# Patient Record
Sex: Female | Born: 1970
Health system: Southern US, Community
[De-identification: ages and names within clinical notes are randomized; demographics above are authoritative.]

## PROBLEM LIST (undated history)

## (undated) VITALS — BP 103/68 | HR 72 | Temp 97.7°F | Resp 16 | Ht 61.0 in | Wt 141.0 lb

## (undated) DIAGNOSIS — I10 Essential (primary) hypertension: Secondary | ICD-10-CM

## (undated) DIAGNOSIS — Z789 Other specified health status: Secondary | ICD-10-CM

## (undated) DIAGNOSIS — F319 Bipolar disorder, unspecified: Secondary | ICD-10-CM

## (undated) DIAGNOSIS — D219 Benign neoplasm of connective and other soft tissue, unspecified: Secondary | ICD-10-CM

## (undated) DIAGNOSIS — D571 Sickle-cell disease without crisis: Secondary | ICD-10-CM

## (undated) DIAGNOSIS — F32A Depression, unspecified: Secondary | ICD-10-CM

## (undated) DIAGNOSIS — Z8742 Personal history of other diseases of the female genital tract: Secondary | ICD-10-CM

## (undated) DIAGNOSIS — C50919 Malignant neoplasm of unspecified site of unspecified female breast: Secondary | ICD-10-CM

## (undated) DIAGNOSIS — D573 Sickle-cell trait: Secondary | ICD-10-CM

## (undated) DIAGNOSIS — H269 Unspecified cataract: Secondary | ICD-10-CM

## (undated) DIAGNOSIS — T7840XA Allergy, unspecified, initial encounter: Secondary | ICD-10-CM

## (undated) DIAGNOSIS — F419 Anxiety disorder, unspecified: Secondary | ICD-10-CM

## (undated) DIAGNOSIS — N921 Excessive and frequent menstruation with irregular cycle: Secondary | ICD-10-CM

## (undated) DIAGNOSIS — J449 Chronic obstructive pulmonary disease, unspecified: Secondary | ICD-10-CM

## (undated) DIAGNOSIS — F329 Major depressive disorder, single episode, unspecified: Secondary | ICD-10-CM

## (undated) DIAGNOSIS — M1990 Unspecified osteoarthritis, unspecified site: Secondary | ICD-10-CM

## (undated) DIAGNOSIS — Z923 Personal history of irradiation: Secondary | ICD-10-CM

## (undated) DIAGNOSIS — C569 Malignant neoplasm of unspecified ovary: Secondary | ICD-10-CM

## (undated) DIAGNOSIS — H409 Unspecified glaucoma: Secondary | ICD-10-CM

## (undated) HISTORY — DX: Sickle-cell disease without crisis: D57.1

## (undated) HISTORY — DX: Personal history of other diseases of the female genital tract: Z87.42

## (undated) HISTORY — DX: Unspecified glaucoma: H40.9

## (undated) HISTORY — DX: Chronic obstructive pulmonary disease, unspecified: J44.9

## (undated) HISTORY — DX: Allergy, unspecified, initial encounter: T78.40XA

## (undated) HISTORY — DX: Benign neoplasm of connective and other soft tissue, unspecified: D21.9

## (undated) HISTORY — DX: Anxiety disorder, unspecified: F41.9

## (undated) HISTORY — PX: CATARACT EXTRACTION, BILATERAL: SHX1313

## (undated) HISTORY — DX: Malignant neoplasm of unspecified site of unspecified female breast: C50.919

## (undated) HISTORY — DX: Unspecified cataract: H26.9

## (undated) HISTORY — DX: Sickle-cell trait: D57.3

## (undated) HISTORY — DX: Excessive and frequent menstruation with irregular cycle: N92.1

---

## 1997-11-02 ENCOUNTER — Inpatient Hospital Stay (HOSPITAL_COMMUNITY): Admission: AD | Admit: 1997-11-02 | Discharge: 1997-11-05 | Payer: Self-pay | Admitting: Obstetrics

## 1998-05-30 ENCOUNTER — Emergency Department (HOSPITAL_COMMUNITY): Admission: EM | Admit: 1998-05-30 | Discharge: 1998-05-30 | Payer: Self-pay

## 1998-07-18 ENCOUNTER — Other Ambulatory Visit: Admission: RE | Admit: 1998-07-18 | Discharge: 1998-07-18 | Payer: Self-pay | Admitting: Obstetrics

## 1999-05-06 ENCOUNTER — Emergency Department (HOSPITAL_COMMUNITY): Admission: EM | Admit: 1999-05-06 | Discharge: 1999-05-07 | Payer: Self-pay | Admitting: Internal Medicine

## 2000-09-16 ENCOUNTER — Emergency Department (HOSPITAL_COMMUNITY): Admission: EM | Admit: 2000-09-16 | Discharge: 2000-09-16 | Payer: Self-pay | Admitting: Emergency Medicine

## 2000-09-16 ENCOUNTER — Encounter: Payer: Self-pay | Admitting: Emergency Medicine

## 2001-04-26 ENCOUNTER — Emergency Department (HOSPITAL_COMMUNITY): Admission: EM | Admit: 2001-04-26 | Discharge: 2001-04-26 | Payer: Self-pay | Admitting: Emergency Medicine

## 2001-07-24 ENCOUNTER — Encounter: Payer: Self-pay | Admitting: Emergency Medicine

## 2001-07-24 ENCOUNTER — Emergency Department (HOSPITAL_COMMUNITY): Admission: EM | Admit: 2001-07-24 | Discharge: 2001-07-24 | Payer: Self-pay | Admitting: Emergency Medicine

## 2002-01-23 ENCOUNTER — Emergency Department (HOSPITAL_COMMUNITY): Admission: EM | Admit: 2002-01-23 | Discharge: 2002-01-23 | Payer: Self-pay | Admitting: Emergency Medicine

## 2002-02-27 ENCOUNTER — Encounter: Payer: Self-pay | Admitting: Emergency Medicine

## 2002-02-27 ENCOUNTER — Emergency Department (HOSPITAL_COMMUNITY): Admission: EM | Admit: 2002-02-27 | Discharge: 2002-02-27 | Payer: Self-pay | Admitting: Emergency Medicine

## 2002-07-17 ENCOUNTER — Emergency Department (HOSPITAL_COMMUNITY): Admission: EM | Admit: 2002-07-17 | Discharge: 2002-07-17 | Payer: Self-pay | Admitting: Emergency Medicine

## 2003-12-02 ENCOUNTER — Emergency Department (HOSPITAL_COMMUNITY): Admission: EM | Admit: 2003-12-02 | Discharge: 2003-12-03 | Payer: Self-pay | Admitting: Emergency Medicine

## 2004-05-28 ENCOUNTER — Emergency Department (HOSPITAL_COMMUNITY): Admission: EM | Admit: 2004-05-28 | Discharge: 2004-05-29 | Payer: Self-pay | Admitting: Emergency Medicine

## 2005-03-18 ENCOUNTER — Emergency Department (HOSPITAL_COMMUNITY): Admission: EM | Admit: 2005-03-18 | Discharge: 2005-03-19 | Payer: Self-pay | Admitting: Emergency Medicine

## 2005-05-26 ENCOUNTER — Emergency Department (HOSPITAL_COMMUNITY): Admission: EM | Admit: 2005-05-26 | Discharge: 2005-05-26 | Payer: Self-pay | Admitting: Emergency Medicine

## 2006-02-20 ENCOUNTER — Emergency Department (HOSPITAL_COMMUNITY): Admission: EM | Admit: 2006-02-20 | Discharge: 2006-02-20 | Payer: Self-pay | Admitting: Emergency Medicine

## 2006-03-12 ENCOUNTER — Emergency Department (HOSPITAL_COMMUNITY): Admission: EM | Admit: 2006-03-12 | Discharge: 2006-03-12 | Payer: Self-pay | Admitting: Emergency Medicine

## 2006-04-12 ENCOUNTER — Emergency Department (HOSPITAL_COMMUNITY): Admission: EM | Admit: 2006-04-12 | Discharge: 2006-04-12 | Payer: Self-pay | Admitting: Emergency Medicine

## 2006-05-15 ENCOUNTER — Emergency Department (HOSPITAL_COMMUNITY): Admission: EM | Admit: 2006-05-15 | Discharge: 2006-05-15 | Payer: Self-pay | Admitting: Emergency Medicine

## 2006-06-16 ENCOUNTER — Emergency Department (HOSPITAL_COMMUNITY): Admission: EM | Admit: 2006-06-16 | Discharge: 2006-06-16 | Payer: Self-pay | Admitting: Emergency Medicine

## 2006-08-09 ENCOUNTER — Emergency Department (HOSPITAL_COMMUNITY): Admission: EM | Admit: 2006-08-09 | Discharge: 2006-08-09 | Payer: Self-pay | Admitting: Emergency Medicine

## 2006-08-12 ENCOUNTER — Emergency Department (HOSPITAL_COMMUNITY): Admission: EM | Admit: 2006-08-12 | Discharge: 2006-08-12 | Payer: Self-pay | Admitting: Emergency Medicine

## 2006-08-26 ENCOUNTER — Emergency Department (HOSPITAL_COMMUNITY): Admission: EM | Admit: 2006-08-26 | Discharge: 2006-08-26 | Payer: Self-pay | Admitting: Emergency Medicine

## 2006-09-13 ENCOUNTER — Ambulatory Visit: Payer: Self-pay | Admitting: Internal Medicine

## 2006-09-16 ENCOUNTER — Ambulatory Visit (HOSPITAL_COMMUNITY): Admission: RE | Admit: 2006-09-16 | Discharge: 2006-09-16 | Payer: Self-pay | Admitting: Internal Medicine

## 2006-09-18 ENCOUNTER — Ambulatory Visit: Payer: Self-pay | Admitting: *Deleted

## 2006-10-29 ENCOUNTER — Ambulatory Visit: Payer: Self-pay | Admitting: Internal Medicine

## 2006-12-28 ENCOUNTER — Emergency Department (HOSPITAL_COMMUNITY): Admission: EM | Admit: 2006-12-28 | Discharge: 2006-12-28 | Payer: Self-pay | Admitting: Emergency Medicine

## 2007-04-29 ENCOUNTER — Emergency Department (HOSPITAL_COMMUNITY): Admission: EM | Admit: 2007-04-29 | Discharge: 2007-04-29 | Payer: Self-pay | Admitting: Emergency Medicine

## 2007-06-04 ENCOUNTER — Encounter (INDEPENDENT_AMBULATORY_CARE_PROVIDER_SITE_OTHER): Payer: Self-pay | Admitting: *Deleted

## 2007-06-27 ENCOUNTER — Emergency Department (HOSPITAL_COMMUNITY): Admission: EM | Admit: 2007-06-27 | Discharge: 2007-06-28 | Payer: Self-pay | Admitting: Emergency Medicine

## 2007-09-27 ENCOUNTER — Emergency Department (HOSPITAL_COMMUNITY): Admission: EM | Admit: 2007-09-27 | Discharge: 2007-09-27 | Payer: Self-pay | Admitting: Emergency Medicine

## 2007-10-22 ENCOUNTER — Ambulatory Visit: Payer: Self-pay | Admitting: Family Medicine

## 2008-06-28 ENCOUNTER — Emergency Department (HOSPITAL_COMMUNITY): Admission: EM | Admit: 2008-06-28 | Discharge: 2008-06-28 | Payer: Self-pay | Admitting: Emergency Medicine

## 2008-07-04 ENCOUNTER — Emergency Department (HOSPITAL_COMMUNITY): Admission: EM | Admit: 2008-07-04 | Discharge: 2008-07-04 | Payer: Self-pay | Admitting: Emergency Medicine

## 2008-08-31 ENCOUNTER — Ambulatory Visit: Payer: Self-pay | Admitting: Family Medicine

## 2008-10-12 ENCOUNTER — Ambulatory Visit: Payer: Self-pay | Admitting: Internal Medicine

## 2008-10-12 LAB — CONVERTED CEMR LAB
ALT: 8 units/L (ref 0–35)
AST: 10 units/L (ref 0–37)
Albumin: 4 g/dL (ref 3.5–5.2)
Alkaline Phosphatase: 65 units/L (ref 39–117)
BUN: 7 mg/dL (ref 6–23)
CO2: 23 meq/L (ref 19–32)
Calcium: 8.7 mg/dL (ref 8.4–10.5)
Chloride: 109 meq/L (ref 96–112)
Cholesterol: 153 mg/dL (ref 0–200)
Creatinine, Ser: 0.73 mg/dL (ref 0.40–1.20)
Glucose, Bld: 76 mg/dL (ref 70–99)
HDL: 43 mg/dL (ref >39)
LDL Cholesterol: 98 mg/dL (ref 0–99)
Potassium: 4.1 meq/L (ref 3.5–5.3)
Sodium: 143 meq/L (ref 135–145)
TSH: 0.998 microintl units/mL (ref 0.350–4.50)
Total Bilirubin: 0.4 mg/dL (ref 0.3–1.2)
Total CHOL/HDL Ratio: 3.6
Total Protein: 6.8 g/dL (ref 6.0–8.3)
Triglycerides: 59 mg/dL (ref <150)
VLDL: 12 mg/dL (ref 0–40)

## 2008-11-11 ENCOUNTER — Emergency Department (HOSPITAL_COMMUNITY): Admission: EM | Admit: 2008-11-11 | Discharge: 2008-11-11 | Payer: Self-pay | Admitting: Emergency Medicine

## 2009-09-26 ENCOUNTER — Ambulatory Visit: Payer: Self-pay | Admitting: Internal Medicine

## 2009-09-30 ENCOUNTER — Inpatient Hospital Stay (HOSPITAL_COMMUNITY): Admission: AD | Admit: 2009-09-30 | Discharge: 2009-09-30 | Payer: Self-pay | Admitting: Obstetrics & Gynecology

## 2009-11-17 ENCOUNTER — Other Ambulatory Visit: Payer: Self-pay | Admitting: Obstetrics & Gynecology

## 2009-11-17 ENCOUNTER — Ambulatory Visit: Payer: Self-pay | Admitting: Obstetrics & Gynecology

## 2009-11-21 ENCOUNTER — Ambulatory Visit (HOSPITAL_COMMUNITY): Admission: RE | Admit: 2009-11-21 | Discharge: 2009-11-21 | Payer: Self-pay | Admitting: Obstetrics & Gynecology

## 2010-01-01 ENCOUNTER — Ambulatory Visit: Payer: Self-pay | Admitting: Obstetrics and Gynecology

## 2010-01-01 ENCOUNTER — Inpatient Hospital Stay (HOSPITAL_COMMUNITY): Admission: AD | Admit: 2010-01-01 | Discharge: 2010-01-01 | Payer: Self-pay | Admitting: Family Medicine

## 2010-03-29 ENCOUNTER — Ambulatory Visit: Payer: Self-pay | Admitting: Obstetrics and Gynecology

## 2010-03-29 ENCOUNTER — Other Ambulatory Visit: Admission: RE | Admit: 2010-03-29 | Discharge: 2010-03-29 | Payer: Self-pay | Admitting: Obstetrics & Gynecology

## 2010-03-29 LAB — CONVERTED CEMR LAB
HCT: 35.6 % — ABNORMAL LOW (ref 36.0–46.0)
Hemoglobin: 11.9 g/dL — ABNORMAL LOW (ref 12.0–15.0)
MCHC: 33.4 g/dL (ref 30.0–36.0)
MCV: 78.9 fL (ref 78.0–100.0)
Platelets: 316 10*3/uL (ref 150–400)
RBC: 4.51 M/uL (ref 3.87–5.11)
RDW: 14 % (ref 11.5–15.5)
TSH: 0.904 microintl units/mL (ref 0.350–4.500)
WBC: 7.7 10*3/uL (ref 4.0–10.5)

## 2010-03-30 ENCOUNTER — Encounter: Payer: Self-pay | Admitting: Obstetrics and Gynecology

## 2010-03-30 LAB — CONVERTED CEMR LAB
Trich, Wet Prep: NONE SEEN
Yeast Wet Prep HPF POC: NONE SEEN

## 2010-04-12 ENCOUNTER — Ambulatory Visit: Payer: Self-pay | Admitting: Obstetrics and Gynecology

## 2010-05-05 ENCOUNTER — Emergency Department (HOSPITAL_COMMUNITY): Admission: EM | Admit: 2010-05-05 | Discharge: 2010-05-05 | Payer: Self-pay | Admitting: Emergency Medicine

## 2010-05-23 ENCOUNTER — Ambulatory Visit: Payer: Self-pay | Admitting: Internal Medicine

## 2010-05-29 ENCOUNTER — Ambulatory Visit (HOSPITAL_COMMUNITY): Admission: RE | Admit: 2010-05-29 | Discharge: 2010-05-29 | Payer: Self-pay | Admitting: Internal Medicine

## 2010-06-26 ENCOUNTER — Encounter (INDEPENDENT_AMBULATORY_CARE_PROVIDER_SITE_OTHER): Payer: Self-pay | Admitting: Internal Medicine

## 2010-06-26 ENCOUNTER — Ambulatory Visit: Payer: Self-pay | Admitting: Internal Medicine

## 2010-06-26 LAB — CONVERTED CEMR LAB
Basophils Absolute: 0 10*3/uL (ref 0.0–0.1)
Basophils Relative: 0 % (ref 0–1)
Eosinophils Absolute: 0.1 10*3/uL (ref 0.0–0.7)
Eosinophils Relative: 1 % (ref 0–5)
HCT: 36.8 % (ref 36.0–46.0)
Hemoglobin: 12.1 g/dL (ref 12.0–15.0)
Lymphocytes Relative: 21 % (ref 12–46)
Lymphs Abs: 1.6 10*3/uL (ref 0.7–4.0)
MCHC: 32.9 g/dL (ref 30.0–36.0)
MCV: 81.1 fL (ref 78.0–100.0)
Monocytes Absolute: 0.4 10*3/uL (ref 0.1–1.0)
Monocytes Relative: 6 % (ref 3–12)
Neutro Abs: 5.3 10*3/uL (ref 1.7–7.7)
Neutrophils Relative %: 72 % (ref 43–77)
Platelets: 337 10*3/uL (ref 150–400)
RBC: 4.54 M/uL (ref 3.87–5.11)
RDW: 13.8 % (ref 11.5–15.5)
TSH: 0.695 microintl units/mL (ref 0.350–4.500)
WBC: 7.4 10*3/uL (ref 4.0–10.5)

## 2010-06-28 ENCOUNTER — Ambulatory Visit: Payer: Self-pay | Admitting: Advanced Practice Midwife

## 2010-07-03 ENCOUNTER — Ambulatory Visit (HOSPITAL_COMMUNITY): Admission: RE | Admit: 2010-07-03 | Discharge: 2010-07-03 | Payer: Self-pay | Admitting: Internal Medicine

## 2010-09-14 ENCOUNTER — Ambulatory Visit: Payer: Self-pay | Admitting: Obstetrics & Gynecology

## 2010-09-20 ENCOUNTER — Inpatient Hospital Stay (HOSPITAL_COMMUNITY)
Admission: AD | Admit: 2010-09-20 | Discharge: 2010-09-20 | Payer: Self-pay | Source: Home / Self Care | Attending: Obstetrics & Gynecology | Admitting: Obstetrics & Gynecology

## 2010-09-20 LAB — CBC
HCT: 33.5 % — ABNORMAL LOW (ref 36.0–46.0)
Hemoglobin: 11.4 g/dL — ABNORMAL LOW (ref 12.0–15.0)
MCH: 26.5 pg (ref 26.0–34.0)
MCHC: 34 g/dL (ref 30.0–36.0)
MCV: 77.9 fL — ABNORMAL LOW (ref 78.0–100.0)
Platelets: 319 10*3/uL (ref 150–400)
RBC: 4.3 MIL/uL (ref 3.87–5.11)
RDW: 13.3 % (ref 11.5–15.5)
WBC: 7.5 10*3/uL (ref 4.0–10.5)

## 2010-09-20 LAB — WET PREP, GENITAL
Clue Cells Wet Prep HPF POC: NONE SEEN
Trich, Wet Prep: NONE SEEN
Yeast Wet Prep HPF POC: NONE SEEN

## 2010-09-21 LAB — GC/CHLAMYDIA PROBE AMP, GENITAL
Chlamydia, DNA Probe: NEGATIVE
GC Probe Amp, Genital: NEGATIVE

## 2010-10-16 ENCOUNTER — Ambulatory Visit
Admission: RE | Admit: 2010-10-16 | Discharge: 2010-10-16 | Payer: Self-pay | Source: Home / Self Care | Attending: Obstetrics & Gynecology | Admitting: Obstetrics & Gynecology

## 2010-10-17 NOTE — Progress Notes (Signed)
[name], [name]             ACCOUNT NO.:  [id number]  MEDICAL RECORD NO.:  [id number]          PATIENT TYPE:  WOC  LOCATION:  [facility]                   FACILITY:  [facility]  PHYSICIAN:  Maryelizabeth Kaufmann, MD  DATE OF BIRTH:  May 24, 1971  DATE OF SERVICE:  10/16/2010                                 CLINIC NOTE  CHIEF COMPLAINT:  Followup from [facility] and test results.  HISTORY OF PRESENT ILLNESS:  This is a 40 year old gravida 3, para 3-0-0- 3, status post C-section x3 with bilateral tubal ligation and currently on Depo-Provera for ovarian cyst who presented to the [facility] on September 20, 2010, with persistent heavy uterine bleeding since her Depo-Provera was started on September 14, 2010.  She stated at that time she was having heavy period.  She is using about 5 pads a day.  The patient did have at that time a hemoglobin of 11.4 and a hematocrit of 33.5.  Transvaginal ultrasound showed endo-thickness of the myometrial endometrial junction possibly due to adenomyosis.  The patient at that time was started on Megace; and since that time for the past couple of weeks, she has not had any vaginal bleeding.  She states she is clinically stable and did not have any acute complaints or problems at this time.  PHYSICAL EXAMINATION:  VITAL SIGNS:  Blood pressure 116/82, pulse 97, temperature 98.7. GENERAL:  No acute distress, alert and oriented x4. CARDIOVASCULAR:  Regular rate and rhythm.  No murmurs, rubs, or gallops. CHEST:  Clear to auscultation bilaterally.  No wheezing, rales, or rhonchi. ABDOMEN:  Positive bowel sounds, soft, nontender, nondistended. GU:  Normal external genitalia.  Mild vaginal atrophy, otherwise, was normal with pink mucosa.  Cervix did have some fallopian cysts, but otherwise, was closed.  No bleeding and no lesions, otherwise were visualized.  On bimanual exam, anteverted uterus, mobile, nontender. Adnexa was soft, nontender with no masses.  ASSESSMENT AND PLAN:   This is a 40 year old gravida 3, para 3-0-0-3 who presents for followup of dysfunctional uterine bleeding secondary to progesterone breakthrough bleeding due to Depo-Provera, currently managed with Megace.  The patient has completed a course of Megace of 14 days.  The patient is otherwise clinically stable, we will manage expectantly, and the patient will otherwise return in 2 months for her next Depo-Provera shot and continue discussion for abnormal uterine bleeding.  The patient was told to return if she has any increase in vaginal bleeding.  However, she is currently clinically stable status post her Megace for the past 14 days.  She is over the age of 47 and she is a smoker; so due to those risk factors, we will not continue Megace at this time and we will manage expectantly.          ______________________________ Maryelizabeth Kaufmann, MD    LC/MEDQ  D:  10/16/2010  T:  10/17/2010  Job:  [id number]

## 2010-11-27 ENCOUNTER — Ambulatory Visit: Payer: Self-pay | Admitting: Family Medicine

## 2010-11-27 DIAGNOSIS — N949 Unspecified condition associated with female genital organs and menstrual cycle: Secondary | ICD-10-CM

## 2010-11-27 DIAGNOSIS — N925 Other specified irregular menstruation: Secondary | ICD-10-CM

## 2010-11-30 LAB — URINALYSIS, ROUTINE W REFLEX MICROSCOPIC
Nitrite: POSITIVE — AB
Specific Gravity, Urine: 1.006 (ref 1.005–1.030)
Urobilinogen, UA: 1 mg/dL (ref 0.0–1.0)

## 2010-11-30 LAB — URINE MICROSCOPIC-ADD ON

## 2010-12-03 LAB — URINE MICROSCOPIC-ADD ON

## 2010-12-03 LAB — POCT PREGNANCY, URINE: Preg Test, Ur: NEGATIVE

## 2010-12-03 LAB — URINALYSIS, ROUTINE W REFLEX MICROSCOPIC
Bilirubin Urine: NEGATIVE
Glucose, UA: NEGATIVE mg/dL
Nitrite: POSITIVE — AB
Specific Gravity, Urine: 1.015 (ref 1.005–1.030)
pH: 5 (ref 5.0–8.0)

## 2010-12-03 LAB — GC/CHLAMYDIA PROBE AMP, GENITAL: Chlamydia, DNA Probe: NEGATIVE

## 2010-12-03 LAB — CBC
Hemoglobin: 11.6 g/dL — ABNORMAL LOW (ref 12.0–15.0)
MCHC: 33.2 g/dL (ref 30.0–36.0)
RDW: 13.4 % (ref 11.5–15.5)

## 2010-12-03 LAB — WET PREP, GENITAL: Trich, Wet Prep: NONE SEEN

## 2010-12-03 LAB — SAMPLE TO BLOOD BANK

## 2010-12-05 ENCOUNTER — Ambulatory Visit: Payer: Self-pay

## 2010-12-05 LAB — URINE MICROSCOPIC-ADD ON

## 2010-12-05 LAB — URINALYSIS, ROUTINE W REFLEX MICROSCOPIC
Bilirubin Urine: NEGATIVE
Leukocytes, UA: NEGATIVE
Nitrite: POSITIVE — AB
Specific Gravity, Urine: <1.005 — ABNORMAL LOW (ref 1.005–1.030)
pH: 6 (ref 5.0–8.0)

## 2010-12-05 LAB — URINE CULTURE

## 2010-12-08 NOTE — Progress Notes (Signed)
[name], [name]             ACCOUNT NO.:  [id number]  MEDICAL RECORD NO.:  [id number]           PATIENT TYPE:  A  LOCATION[facility]  [facility]                   FACILITY:  [facility]  PHYSICIAN:  Maryelizabeth Kaufmann, MD  DATE OF BIRTH:  11-11-1970  DATE OF SERVICE:  11/27/2010                                 CLINIC NOTE  CHIEF COMPLAINT:  2 months followup, dysfunctional uterine bleeding.  HISTORY OF PRESENT ILLNESS:  This is a 40 year old gravida 3, para 3-0-0- 3, who initially presented about a month ago for progesterone breakthrough bleeding and dysfunctional uterine bleeding on Depo- Provera.  Today, she states that she still has some mild pinkish vaginal discharge and her bleeding has subsequently resolved.  She did state that she has some umbilical abdominal pain that started yesterday.  No fever or chills, but did have one episode of vomiting yesterday.  She denies any sick contacts.  She is otherwise asymptomatic and doing well with no other acute complaints.  PHYSICAL EXAMINATION:  VITAL SIGNS:  Blood pressure 113/79, pulse 95, temperature 98.2, and weight 176.8. ABDOMEN:  Positive bowel sounds.  Soft, some mild umbilical tenderness to palpation.  No rebound or guarding.  No radiation of the pain. EXTREMITIES:  No clubbing, cyanosis, or edema.  ASSESSMENT AND PLAN:  This is a 40 year old G3, P3-0-0-3 with dysfunctional uterine bleeding controlled on Depo-Provera. 1. We will have the patient get her Depo-Provera injection today. 2. [facility].  Her last Pap smear was March 2011.  We will have     the patient return in 1 month for complete well-woman exam.          ______________________________ Maryelizabeth Kaufmann, MD    LC/MEDQ  D:  11/27/2010  T:  11/28/2010  Job:  [id number]

## 2010-12-11 LAB — POCT URINALYSIS DIP (DEVICE)
Bilirubin Urine: NEGATIVE
Glucose, UA: NEGATIVE mg/dL
Ketones, ur: NEGATIVE mg/dL
Specific Gravity, Urine: 1.015 (ref 1.005–1.030)

## 2010-12-20 ENCOUNTER — Inpatient Hospital Stay (HOSPITAL_COMMUNITY): Payer: Self-pay

## 2010-12-20 ENCOUNTER — Inpatient Hospital Stay (HOSPITAL_COMMUNITY)
Admission: AD | Admit: 2010-12-20 | Discharge: 2010-12-20 | Disposition: A | Discharge status: Home / Self Care | Payer: Self-pay | Source: Ambulatory Visit | Attending: Obstetrics & Gynecology | Admitting: Obstetrics & Gynecology

## 2010-12-20 DIAGNOSIS — N949 Unspecified condition associated with female genital organs and menstrual cycle: Secondary | ICD-10-CM

## 2010-12-20 DIAGNOSIS — N390 Urinary tract infection, site not specified: Secondary | ICD-10-CM

## 2010-12-20 DIAGNOSIS — N938 Other specified abnormal uterine and vaginal bleeding: Secondary | ICD-10-CM | POA: Insufficient documentation

## 2010-12-20 LAB — POCT PREGNANCY, URINE: Preg Test, Ur: NEGATIVE

## 2010-12-20 LAB — URINALYSIS, ROUTINE W REFLEX MICROSCOPIC
Ketones, ur: 15 mg/dL — AB
Nitrite: POSITIVE — AB
Protein, ur: 100 mg/dL — AB
pH: 6.5 (ref 5.0–8.0)

## 2010-12-20 LAB — URINE MICROSCOPIC-ADD ON

## 2010-12-20 LAB — CBC
HCT: 35.3 % — ABNORMAL LOW (ref 36.0–46.0)
Hemoglobin: 12.2 g/dL (ref 12.0–15.0)
Platelets: 315 10*3/uL (ref 150–400)
WBC: 9.1 10*3/uL (ref 4.0–10.5)

## 2010-12-20 LAB — WET PREP, GENITAL
Clue Cells Wet Prep HPF POC: NONE SEEN
Trich, Wet Prep: NONE SEEN

## 2010-12-21 LAB — GC/CHLAMYDIA PROBE AMP, GENITAL: Chlamydia, DNA Probe: NEGATIVE

## 2010-12-29 ENCOUNTER — Ambulatory Visit: Payer: Self-pay | Admitting: Family

## 2010-12-29 ENCOUNTER — Ambulatory Visit: Payer: Self-pay | Admitting: Obstetrics & Gynecology

## 2011-01-02 LAB — D-DIMER, QUANTITATIVE: D-Dimer, Quant: 0.37 ug/mL-FEU (ref 0.00–0.48)

## 2011-01-02 LAB — PROTIME-INR
INR: 1.1 (ref 0.00–1.49)
Prothrombin Time: 14.4 seconds (ref 11.6–15.2)

## 2011-01-02 LAB — COMPREHENSIVE METABOLIC PANEL
BUN: 6 mg/dL (ref 6–23)
CO2: 21 mEq/L (ref 19–32)
Calcium: 7.7 mg/dL — ABNORMAL LOW (ref 8.4–10.5)
Creatinine, Ser: 0.88 mg/dL (ref 0.4–1.2)
GFR calc non Af Amer: >60 mL/min (ref >60)
Glucose, Bld: 263 mg/dL — ABNORMAL HIGH (ref 70–99)
Total Protein: 6.5 g/dL (ref 6.0–8.3)

## 2011-01-02 LAB — POCT CARDIAC MARKERS
Myoglobin, poc: 197 ng/mL (ref 12–200)
Myoglobin, poc: 373 ng/mL (ref 12–200)
Troponin i, poc: <0.05 ng/mL (ref 0.00–0.09)
Troponin i, poc: <0.05 ng/mL (ref 0.00–0.09)

## 2011-01-02 LAB — POCT I-STAT, CHEM 8
Calcium, Ion: 1.12 mmol/L (ref 1.12–1.32)
Chloride: 106 mEq/L (ref 96–112)
Glucose, Bld: 267 mg/dL — ABNORMAL HIGH (ref 70–99)
HCT: 38 % (ref 36.0–46.0)
Hemoglobin: 12.9 g/dL (ref 12.0–15.0)
Potassium: 4 mEq/L (ref 3.5–5.1)

## 2011-01-02 LAB — URINALYSIS, ROUTINE W REFLEX MICROSCOPIC
Bilirubin Urine: NEGATIVE
Ketones, ur: NEGATIVE mg/dL
Leukocytes, UA: NEGATIVE
Nitrite: NEGATIVE
Specific Gravity, Urine: 1.012 (ref 1.005–1.030)
Urobilinogen, UA: 0.2 mg/dL (ref 0.0–1.0)
pH: 6 (ref 5.0–8.0)

## 2011-01-02 LAB — CBC
Hemoglobin: 12 g/dL (ref 12.0–15.0)
MCHC: 33.4 g/dL (ref 30.0–36.0)
MCV: 83.7 fL (ref 78.0–100.0)
RBC: 4.3 MIL/uL (ref 3.87–5.11)
RDW: 13.2 % (ref 11.5–15.5)

## 2011-01-02 LAB — DIFFERENTIAL
Lymphs Abs: 4 10*3/uL (ref 0.7–4.0)
Monocytes Relative: 5 % (ref 3–12)
Neutro Abs: 6.5 10*3/uL (ref 1.7–7.7)
Neutrophils Relative %: 58 % (ref 43–77)

## 2011-01-02 LAB — URINE MICROSCOPIC-ADD ON

## 2011-01-29 ENCOUNTER — Other Ambulatory Visit (HOSPITAL_COMMUNITY)
Admission: RE | Admit: 2011-01-29 | Discharge: 2011-01-29 | Disposition: A | Discharge status: Home / Self Care | Payer: Self-pay | Source: Ambulatory Visit | Attending: Family Medicine | Admitting: Family Medicine

## 2011-01-29 ENCOUNTER — Other Ambulatory Visit: Payer: Self-pay | Admitting: Family Medicine

## 2011-01-29 ENCOUNTER — Ambulatory Visit: Payer: Self-pay | Admitting: Family Medicine

## 2011-01-29 DIAGNOSIS — N938 Other specified abnormal uterine and vaginal bleeding: Secondary | ICD-10-CM | POA: Insufficient documentation

## 2011-01-29 DIAGNOSIS — N949 Unspecified condition associated with female genital organs and menstrual cycle: Secondary | ICD-10-CM

## 2011-01-29 DIAGNOSIS — Z1231 Encounter for screening mammogram for malignant neoplasm of breast: Secondary | ICD-10-CM

## 2011-01-29 DIAGNOSIS — Z01419 Encounter for gynecological examination (general) (routine) without abnormal findings: Secondary | ICD-10-CM

## 2011-01-29 LAB — POCT PREGNANCY, URINE: Preg Test, Ur: NEGATIVE

## 2011-01-30 NOTE — Group Therapy Note (Signed)
NAME:  [name], [name]             ACCOUNT NO.:  [id number]  MEDICAL RECORD NO.:  [id number]           PATIENT TYPE:  A  [facility]ATION[facility]                   FACILITY:  [facility]  PHYSICIAN:  Maryelizabeth Kaufmann, MD  DATE OF BIRTH:  1971/03/04  DATE OF SERVICE:  01/29/2011                                 CLINIC NOTE  HISTORY OF PRESENT ILLNESS:  This is a 40 year old gravida 3, para 3-0-0- 3, status post BTL, presenting with persistent dysfunctional uterine bleeding, for she is currently on Depo-Provera, but she is using any intermittent doses of Megace, when she presents to the MAU and she states that she has been doing that off and on for the past couple of months.  She states that now her bleeding has been persistent since January 07, 2011, due to the fact that she has not been able to get any Megace.  She did not take any additional over-the-counter medications. She denies any history of bleeding disorders.  She denies any breast symptoms.  She does complain of some persistent back pain, but otherwise, his symptoms are currently controlled.  She had a urine pregnancy test, which was negative.  PHYSICAL EXAMINATION:  VITAL SIGNS:  Blood pressure 118/86, pulse 96, temperature 99.3, weight 176.0, height 5 feet 1 inch. GENERAL:  The patient is in no acute distress.  Alert and oriented x4. CARDIOVASCULAR:  Regular rate and rhythm.  No murmurs, rubs, or gallops. CHEST:  Clear to auscultation bilaterally.  No wheezing, rales, or rhonchi. NECK:  Soft and supple.  No thyromegaly.  Breasts are soft.  No masses. No lymphadenopathy.  No tenderness.  No discharge bilaterally.  No axillary lymphadenopathy. ABDOMEN:  Positive bowel sounds, soft, nontender, nondistended. Positive for mild obesity. EXTREMITIES:  No clubbing, cyanosis, or edema. GU:  Normal external genitalia.  Normal vaginal mucosa.  Cervix with cyst.  Pap smear was performed.  There was light bleeding that was noted most notably  in the posterior fornix.  We did do endometrial biopsy.  PROCEDURE NOTE:  As follows, after informed consent was obtained.  The cervix was prepped with Betadine.  The anterior lip of the cervix was grasped with a single-tooth tenaculum and endometrial biopsy was performed for surrounding of the uterus to 8 cm with two passages of the pipette.  The patient tolerated procedure well.  There was noted to be good tissue that was returned.  Bimanual uterus appeared to be possibly 6 weeks' size, anteverted, soft, some mild adnexal tenderness to palpation.  No masses were noted.  ADDITIONAL HISTORY:  Ultrasound back in September 20, 2010, that was notable for endo-thickness of the myometrial endometrial junction rendering visualization of the endometrium difficult, maybe due to adenomyosis.  The uterus is 8.5 x 5.2 x 4.3 at that time.  She did have an endometrial biopsy that was done last year that was negative.  Labs at that time were also unremarkable.  ASSESSMENT AND PLAN:  This is a 40 year old gravida 3, para 3-0-0-3, status post bilateral tubal ligation, presenting with dysfunctional uterine bleeding on Depo-Provera.  PLAN: 1. We did perform endometrial biopsy. 2. Pap with a well-woman. 3. Depo-Provera, which be given  today. 4. We did order CBC, CMP, coags such as PT, PTT, INR, and TSH.  We did     give her Depo shot today.  We will have the patient to return to     clinic in about 2 weeks to follow up of her results and her     endometrial biopsy with the surgeon for assessment for possible     endometrial ablation or any other surgical options.          ______________________________ Maryelizabeth Kaufmann, MD    LC/MEDQ  D:  01/29/2011  T:  01/30/2011  Job:  [id number]

## 2011-01-30 NOTE — Assessment & Plan Note (Signed)
[name], [name]             ACCOUNT NO.:  [id number]   MEDICAL RECORD NO.:  [id number]          PATIENT TYPE:  WOC   LOCATION:  [facility]         FACILITY:  [facility]   PHYSICIAN:  Caren Griffins, CNM       DATE OF BIRTH:  1971-08-29   DATE OF SERVICE:  04/12/2010                                  CLINIC NOTE   REASON FOR VISIT:  Results of endometrial biopsy.   HISTORY:  This is a 40 year old BF G3 P3, status post tubal who has been  followed by [place] since she went to [facility]U in January of this year for  menorrhagia.  At that point, she had an ultrasound showing a 3-mm  endometrial stripe.  No lesions.  Normal uterus and she did have a right  ovarian cyst of 4.9 x 4.3 x 2.8.  Dr. Debroah Loop saw her here in March and  had her started on a menstrual calendar and discussed with her that she  may become a candidate for endometrial ablation or Mirena.  She was seen  here March 29, 2010, and did have an endometrial biopsy which was benign  secretory endometrium.  No hyperplasia and she had a TSH that was  normal.  A CBC showing her hemoglobin of 11.9 and a wet prep showing  clue cells.  Of note at that visit, she did report a thin malodorous  discharge.  She still has a abnormal discharge that sometimes is yellow  and she still continues to have mid low back pain on an intermittent  basis especially premenstrually.  Since her last visit when she gave a  complete history of her bleeding pattern, she had a normal menstrual  period March 30, 2010, lasting 5 days with moderate flow.  We discussed  that the AUB is probably not from her fibroid which is only 8-mm and  reassured her about her results as above.  We talked that her options to  control the bleeding and she does report that she was very happy using  Depo-Provera in the past in fact it was amenorrhea, Depo, and would like  to try that rather than go forward with Mirena or endometrial ablation.  She got a negative pregnancy test done by  clinic protocol despite her  having had a tubal and a recent period and this is negative; therefore,  she will be given Depo-Provera 150 mg IM every 3 months.           ______________________________  Caren Griffins, CNM     DP/MEDQ  D:  04/12/2010  T:  04/13/2010  Job:  [id number]

## 2011-02-19 ENCOUNTER — Other Ambulatory Visit: Payer: Self-pay | Admitting: Obstetrics & Gynecology

## 2011-02-19 ENCOUNTER — Ambulatory Visit: Payer: Self-pay | Admitting: Obstetrics & Gynecology

## 2011-02-19 DIAGNOSIS — N800 Endometriosis of uterus: Secondary | ICD-10-CM

## 2011-02-19 DIAGNOSIS — N939 Abnormal uterine and vaginal bleeding, unspecified: Secondary | ICD-10-CM

## 2011-02-19 DIAGNOSIS — N926 Irregular menstruation, unspecified: Secondary | ICD-10-CM

## 2011-02-19 LAB — POCT PREGNANCY, URINE: Preg Test, Ur: NEGATIVE

## 2011-02-20 ENCOUNTER — Ambulatory Visit (HOSPITAL_COMMUNITY): Payer: Self-pay

## 2011-02-20 NOTE — Group Therapy Note (Signed)
NAMERICHARD, HOLZ             ACCOUNT NO.:  [id number]  MEDICAL RECORD NO.:  [id number]           PATIENT TYPE:  A  [facility]ATION[facility]                   FACILITY:  [facility]  PHYSICIAN:  Elsie Lincoln, MD      DATE OF BIRTH:  01-22-1971  DATE OF SERVICE:  02/19/2011                                 CLINIC NOTE  The patient is a 40 year old female with menometrorrhagia.  The patient is on Depo-Provera, but she still has bleeding.  The patient had endometrial biopsy which was negative for malignancy and also had a negative Pap smear last visit.  She had a transvaginal ultrasound which showed an essentially normal-sized uterus with indistinction between the myometrial and endometrial junction.  This could possibly due to adenomyosis, but MRI will be necessary to completely diagnose.  The patient is on the [facility] and we will order an MRI.  We talked about a Mirena IUD.  Mirena IUD has a localized progesterone- secreting effect that can help dysfunctional uterine bleeding even in the phase of possible adenomyosis.  The next option would be an ablation.  The patient does opt for a Mirena IUD.  She understands the risks including, but not limited to bleeding, infection, damage to the back of uterus and placement of the IUD in the peritoneal cavity.  The patient's urinary pregnancy test is negative today and gonorrhea and Chlamydia cultures were taken.  PAST MEDICAL HISTORY:  Asthma.  PAST SURGICAL HISTORY:  C-section x3.  No history of clots in her legs or lungs.  She does have zero sickle cell trait.  FAMILY HISTORY:  Positive for ovarian and uterine cancer in her mother. The patient would qualify for BRCA testing in the next visit when we get her to [facility] for the truncated testing for indigent care.  PHYSICAL EXAMINATION:  VITAL SIGNS:  Temperature 99.1, pulse 100, blood pressure 113/79, weight 171.5. GENERAL:  Well nourished, well developed, no apparent  distress. HEENT:  Normocephalic, atraumatic. CHEST:  Normal movement. ABDOMEN:  Soft, nontender. GENITALIA:  Uterus anteverted.  Vagina pink, normal rugae.  Cervix is closed, nontender.  PROCEDURE:  After informed consent was obtained, the patient was placed on dorsal lithotomy position.  A speculum was placed in the vagina.  The cervix was cleaned with Betadine and the anterior lip was grasped with single-tooth speculum.  The uterus sounded to 7.5 cm and Mirena IUD was placed.  The strings were cut 2-3 cm.  The patient tolerated the procedure well and was given 800 mg of Motrin afterwards for pain.  ASSESSMENT AND PLAN:  A 40 year old female with dysfunctional uterine bleeding. 1. Questionable possible adenomyosis.  Intrauterine contraceptive     device is a good treatment for this and intrauterine contraceptive     device was placed today. 2. String checks monthly. 3. Return to clinic in 6 weeks for string checks. 4. The patient needs to be offered BRCA testing at [facility] for     the history of ovarian cancer in her mother.          ______________________________ Elsie Lincoln, MD    KL/MEDQ  D:  02/19/2011  T:  02/20/2011  Job:  [id number]

## 2011-02-27 ENCOUNTER — Ambulatory Visit (HOSPITAL_COMMUNITY)
Admission: RE | Admit: 2011-02-27 | Discharge: 2011-02-27 | Disposition: A | Discharge status: Home / Self Care | Payer: Self-pay | Source: Ambulatory Visit | Attending: Obstetrics & Gynecology | Admitting: Obstetrics & Gynecology

## 2011-02-27 DIAGNOSIS — Z1231 Encounter for screening mammogram for malignant neoplasm of breast: Secondary | ICD-10-CM | POA: Insufficient documentation

## 2011-04-09 ENCOUNTER — Ambulatory Visit (INDEPENDENT_AMBULATORY_CARE_PROVIDER_SITE_OTHER): Payer: Self-pay | Admitting: Family Medicine

## 2011-04-09 VITALS — BP 118/84 | HR 83 | Temp 98.9°F | Ht 61.0 in | Wt 173.1 lb

## 2011-04-09 DIAGNOSIS — Z975 Presence of (intrauterine) contraceptive device: Secondary | ICD-10-CM

## 2011-04-09 DIAGNOSIS — R82998 Other abnormal findings in urine: Secondary | ICD-10-CM

## 2011-04-09 DIAGNOSIS — N920 Excessive and frequent menstruation with regular cycle: Secondary | ICD-10-CM

## 2011-04-09 DIAGNOSIS — R8290 Unspecified abnormal findings in urine: Secondary | ICD-10-CM

## 2011-04-09 DIAGNOSIS — N921 Excessive and frequent menstruation with irregular cycle: Secondary | ICD-10-CM

## 2011-04-09 DIAGNOSIS — Z30431 Encounter for routine checking of intrauterine contraceptive device: Secondary | ICD-10-CM

## 2011-04-09 DIAGNOSIS — IMO0001 Reserved for inherently not codable concepts without codable children: Secondary | ICD-10-CM

## 2011-04-09 LAB — POCT URINALYSIS DIP (DEVICE)
Ketones, ur: NEGATIVE mg/dL
Nitrite: POSITIVE — AB
Protein, ur: NEGATIVE mg/dL
Urobilinogen, UA: 0.2 mg/dL (ref 0.0–1.0)
pH: 6 (ref 5.0–8.0)

## 2011-04-09 MED ORDER — SULFAMETHOXAZOLE-TRIMETHOPRIM 800-160 MG PO TABS: 1.0000 | ORAL_TABLET | Freq: Two times a day (BID) | ORAL | Status: AC

## 2011-04-09 NOTE — Patient Instructions (Signed)
Dysfunctional Uterine Bleeding (DUB) (Abnormal Uterine Bleeding [AUB]) Normally menstrual periods begin between ages 14 to 54 in young women. A normal menstrual cycle/period may begin every 23 days up to 35 days and lasts from 1 to 7 days. Around 12 to 14 days before your menstrual period starts, ovulation (ovary produces an egg) occurs. When counting the time between menstrual periods, count from the first day of bleeding of the previous period to the first day of bleeding of the next period. Dysfunctional (abnormal) uterine bleeding is bleeding that is different from a normal menstrual period. Your periods may come earlier or later than usual. They may be lighter, have blood clots or be heavier. You may have bleeding between periods, or you may skip one period or more. You may have bleeding after sexual intercourse, bleeding after menopause, or no menstrual period. CAUSES  Pregnancy (normal, miscarriage, tubal).  IUDs (intrauterine device, birth control).   Birth control pills.   Hormone treatment.   Menopause.   Infection of the cervix.   Blood clotting problems.   Infection of the inside lining of the uterus.   Endometriosis, inside lining of the uterus growing in the pelvis and other female organs.   Adhesions (scar tissue) inside the uterus.   Obesity or severe weight loss.   Uterine polyps inside the uterus.  Cancer of the vagina, cervix, or uterus.   Ovarian cysts or polycystic ovary syndrome.   Medical problems (diabetes, thyroid disease).   Uterine fibroids (noncancerous tumor).   Problems with your female hormones.   Endometrial hyperplasia, very thick lining and enlarged cells inside of the uterus.   Medicines that interfere with ovulation.   Radiation to the pelvis or abdomen.   Chemotherapy.   DIAGNOSIS  Your doctor will discuss the history of your menstrual periods, medicines you are taking, changes in your weight, stress in your life, and any medical  problems you may have.   Your doctor will do a physical and pelvic examination.   Your doctor may want to perform certain tests to make a diagnosis, such as:   Pap test.  Blood tests.   Cultures for infection.   CT scan.   Ultrasound.  Hysteroscopy.   Laparoscopy.   MRI.   Hysterosalpingography.  D and C.   Endometrial biopsy.   TREATMENT Treatment will depend on the cause of the dysfunctional uterine bleeding (DUB). Treatment may include:  Observing your menstrual periods for a couple of months.   Prescribing medicines for medical problems, including:   Antibiotics.   Hormones.   Birth control pills.   Removing an IUD (intrauterine device, birth control).   Surgery:   D and C (scrape and remove tissue from inside the uterus).   Laparoscopy (examine inside the abdomen with a lighted tube).   Uterine ablation (destroy lining of the uterus with electrical current, laser, heat, or freezing).   Hysteroscopy (examine cervix and uterus with a lighted tube).   Hysterectomy (remove the uterus).  HOME CARE INSTRUCTIONS  If medicines were prescribed, take exactly as directed. Do not change or switch medicines without consulting your caregiver.   Long term heavy bleeding may result in iron deficiency. Your caregiver may have prescribed iron pills. They help replace the iron that your body lost from heavy bleeding. Take exactly as directed.   Do not take aspirin or medicines that contain aspirin one week before or during your menstrual period. Aspirin may make the bleeding worse.   If you need to change  your sanitary pad or tampon more than once every 2 hours, stay in bed with your feet elevated and a cold pack on your lower abdomen. Rest as much as possible, until the bleeding stops or slows down.   Eat well-balanced meals. Eat foods high in iron. Examples are:   Leafy green vegetables.  Whole-grain breads and cereals.   Eggs.   Meat.  Liver.    Do not try  to lose weight until the abnormal bleeding has stopped and your blood iron level is back to normal. Do not lift more than ten pounds or do strenuous activities when you are bleeding.   For a couple of months, make note on your calendar, marking the start and ending of your period, and the type of bleeding (light, medium, heavy, spotting, clots or missed periods). This is for your caregiver to better evaluate your problem.  SEEK MEDICAL CARE IF:  You develop nausea (feeling sick to your stomach) and vomiting, dizziness, or diarrhea while you are taking your medicine.   You are getting lightheaded or weak.   You have any problems that may be related to the medicine you are taking.   You develop pain with your DUB.   You want to remove your IUD.   You want to stop or change your birth control pills or hormones.   You have any type of abnormal bleeding mentioned above.   You are over 53 years old and have not had a menstrual period yet.   You are 40 years old and you are still having menstrual periods.   You have any of the symptoms mentioned above.   You develop a rash.  SEEK IMMEDIATE MEDICAL CARE IF:  An oral temperature above 100.5 develops.   You develop chills.   You are changing your sanitary pad or tampon more than once an hour.   You develop abdominal pain.   You pass out or faint.

## 2011-04-09 NOTE — Progress Notes (Signed)
Subjective:     [name] is a 40 y.o. female here for an IUD check.  IUD placed 02/19/11 for menometrorrhagia.  EMBx and pap neg.  Pt currently states the length of her bleeding is unchanged but is slightly lighter.  She also complains of some urine malodor for the past week not associated with dysuria, hematuria, fever, chills, or abdominal pain. She also denies any vag d/c, itching or odor.      Gynecologic History Patient's last menstrual period was 04/04/2011. Contraception: IUD Last Pap: 01/29/11. Results were: normal Last mammogram: 02/27/11. Results were: normal  Obstetric History OB History    Grav Para Term Preterm Abortions TAB SAB Ect Mult Living   3 3        3      # Outc Date GA Lbr Len/2nd Wgt Sex Del Anes PTL Lv   1 PAR            2 PAR            3 PAR                The following portions of the patient's history were reviewed and updated as appropriate: allergies, current medications, past family history, past medical history, past social history, past surgical history and problem list.  Review of Systems Pertinent items are noted in HPI.    Objective:    BP 118/84  Pulse 83  Temp(Src) 98.9 F (37.2 C) (Oral)  Ht 5\' 1"  (1.549 m)  Wt 173 lb 1.6 oz (78.518 kg)  BMI 32.71 kg/m2  LMP 04/04/2011 General appearance: alert and cooperative Abdomen: soft, non-tender; bowel sounds normal; no masses,  no organomegaly Pelvic: cervix normal in appearance, external genitalia normal, no adnexal masses or tenderness, rectovaginal septum normal, vagina normal without discharge and IUD strings visible Skin: Skin color, texture, turgor normal. No rashes or lesions     Assessment:    IUD check.  Menometrorrhagia-improving Urine malodor.    Plan:  Will check UA today-UA suggestive of UTI.  Will treat with bactrim DS and send urine culture.   Follow up in: 6 months. or prn.

## 2011-04-14 LAB — URINE CULTURE: Colony Count: >=100000

## 2011-06-18 LAB — URINE CULTURE: Colony Count: >=100000

## 2011-06-18 LAB — URINALYSIS, ROUTINE W REFLEX MICROSCOPIC
Bilirubin Urine: NEGATIVE
Ketones, ur: NEGATIVE
Nitrite: NEGATIVE
Protein, ur: NEGATIVE
pH: 6

## 2011-06-18 LAB — URINE MICROSCOPIC-ADD ON

## 2011-06-25 ENCOUNTER — Ambulatory Visit (INDEPENDENT_AMBULATORY_CARE_PROVIDER_SITE_OTHER): Payer: Self-pay | Admitting: Family Medicine

## 2011-06-25 ENCOUNTER — Encounter: Payer: Self-pay | Admitting: Family Medicine

## 2011-06-25 VITALS — BP 113/76 | HR 100 | Temp 97.6°F | Ht 61.0 in | Wt 163.1 lb

## 2011-06-25 DIAGNOSIS — N949 Unspecified condition associated with female genital organs and menstrual cycle: Secondary | ICD-10-CM

## 2011-06-25 DIAGNOSIS — Z23 Encounter for immunization: Secondary | ICD-10-CM

## 2011-06-25 DIAGNOSIS — N938 Other specified abnormal uterine and vaginal bleeding: Secondary | ICD-10-CM

## 2011-06-25 LAB — TSH: TSH: 0.676 u[IU]/mL (ref 0.350–4.500)

## 2011-06-25 MED ORDER — INFLUENZA VIRUS VACC SPLIT PF IM SUSP
0.5000 mL | Freq: Once | INTRAMUSCULAR | Status: AC
  Administered 2011-06-25: 0.5 mL via INTRAMUSCULAR

## 2011-06-25 NOTE — Progress Notes (Signed)
  Subjective:    Patient ID: [name], female    DOB: [birth year], 40 y.o.   MRN: [id number]  HPI This is a 40 year old patient who is seen for dysfunctional uterine bleeding.  She was seen in July following an IUD insertion.  She reports that her periods have lessened, but still has a period of 14 days.  She also has some intermenses bleeding for a day, with 3-4 days in between.  Going through 2 heavy tampons a day when bleeding.  Has back pain with this.  No fevers, chills, discharge, numbness, muscle weakness, galactorrhea.  She has had some mild weight loss.  No fatigue.   Review of Systems     Objective:   Physical Exam  Constitutional: She appears well-developed.  Abdominal: Soft. Bowel sounds are normal.  Genitourinary:       No genital or vaginal wall lacerations.  Cervix non-friable in appearance.  No discharge.  Scant old blood in vaginal vault.  IUD strings visible from cervical os.          Assessment & Plan:  1.  Dysfunctional Uterine Bleeding IUD strings still visible.  Will check FSH, TSH, and prolactin, as this has not been done recently.  Will have pt follow up in 4 months for check.  If still not having control of her menorrhagia, would consider endometrial ablation.

## 2011-06-25 NOTE — Progress Notes (Signed)
Addended by: Toula Moos on: 06/25/2011 02:57 PM   Modules accepted: Orders

## 2011-06-28 DIAGNOSIS — D219 Benign neoplasm of connective and other soft tissue, unspecified: Secondary | ICD-10-CM | POA: Insufficient documentation

## 2011-06-28 DIAGNOSIS — D573 Sickle-cell trait: Secondary | ICD-10-CM | POA: Insufficient documentation

## 2011-06-28 DIAGNOSIS — Z8742 Personal history of other diseases of the female genital tract: Secondary | ICD-10-CM | POA: Insufficient documentation

## 2011-06-28 LAB — CBC
Hemoglobin: 10.7 — ABNORMAL LOW
MCHC: 32.5
MCV: 82.6
RBC: 3.98
RDW: 13.5

## 2011-06-28 LAB — OCCULT BLOOD X 1 CARD TO LAB, STOOL: Fecal Occult Bld: NEGATIVE

## 2011-06-28 LAB — DIFFERENTIAL
Basophils Relative: 0
Eosinophils Absolute: 0.1
Monocytes Absolute: 0.6
Monocytes Relative: 6
Neutrophils Relative %: 71

## 2011-07-02 LAB — DIFFERENTIAL
Eosinophils Absolute: 0.1
Eosinophils Relative: 1
Lymphs Abs: 1.6
Monocytes Absolute: 0.4
Monocytes Relative: 5

## 2011-07-02 LAB — URINALYSIS, ROUTINE W REFLEX MICROSCOPIC
Bilirubin Urine: NEGATIVE
Hgb urine dipstick: NEGATIVE
Nitrite: NEGATIVE
Protein, ur: NEGATIVE
Urobilinogen, UA: 1

## 2011-07-02 LAB — CBC
HCT: 36.3
Hemoglobin: 12.4
MCV: 79.4
RBC: 4.57
WBC: 7.5

## 2011-07-02 LAB — COMPREHENSIVE METABOLIC PANEL
ALT: 11
BUN: 3 — ABNORMAL LOW
CO2: 25
Calcium: 8.1 — ABNORMAL LOW
Creatinine, Ser: 0.73
GFR calc non Af Amer: >60
Glucose, Bld: 94
Sodium: 140

## 2011-07-02 LAB — LIPASE, BLOOD: Lipase: 16

## 2011-07-04 ENCOUNTER — Telehealth: Payer: Self-pay | Admitting: *Deleted

## 2011-07-04 NOTE — Telephone Encounter (Signed)
Pt requests results from last office visit. I returned pt call and informed her of all test results were normal. Pt voiced understanding and will keep next appt as scheduled

## 2011-08-20 ENCOUNTER — Emergency Department (HOSPITAL_COMMUNITY)
Admission: EM | Admit: 2011-08-20 | Discharge: 2011-08-20 | Disposition: A | Discharge status: Home / Self Care | Payer: Self-pay | Attending: Emergency Medicine | Admitting: Emergency Medicine

## 2011-08-20 ENCOUNTER — Emergency Department (HOSPITAL_COMMUNITY): Payer: Self-pay

## 2011-08-20 ENCOUNTER — Encounter (HOSPITAL_COMMUNITY): Payer: Self-pay

## 2011-08-20 DIAGNOSIS — R634 Abnormal weight loss: Secondary | ICD-10-CM | POA: Insufficient documentation

## 2011-08-20 DIAGNOSIS — R61 Generalized hyperhidrosis: Secondary | ICD-10-CM | POA: Insufficient documentation

## 2011-08-20 DIAGNOSIS — R0602 Shortness of breath: Secondary | ICD-10-CM | POA: Insufficient documentation

## 2011-08-20 DIAGNOSIS — M79609 Pain in unspecified limb: Secondary | ICD-10-CM | POA: Insufficient documentation

## 2011-08-20 DIAGNOSIS — R209 Unspecified disturbances of skin sensation: Secondary | ICD-10-CM | POA: Insufficient documentation

## 2011-08-20 MED ORDER — IBUPROFEN 800 MG PO TABS
800.0000 mg | ORAL_TABLET | Freq: Once | ORAL | Status: AC
  Administered 2011-08-20: 800 mg via ORAL
  Filled 2011-08-20: qty 1

## 2011-08-20 MED ORDER — FLUTICASONE-SALMETEROL 500-50 MCG/DOSE IN AEPB: 2.0000 | INHALATION_SPRAY | Freq: Two times a day (BID) | RESPIRATORY_TRACT | Status: DC

## 2011-08-20 MED ORDER — ALBUTEROL SULFATE (5 MG/ML) 0.5% IN NEBU
5.0000 mg | INHALATION_SOLUTION | Freq: Once | RESPIRATORY_TRACT | Status: AC
  Administered 2011-08-20: 5 mg via RESPIRATORY_TRACT
  Filled 2011-08-20: qty 1

## 2011-08-20 NOTE — ED Provider Notes (Signed)
Medical screening examination/treatment/procedure(s) were performed by non-physician practitioner and as supervising physician I was immediately available for consultation/collaboration.   Dayton Bailiff, MD 08/20/11 [phone]

## 2011-08-20 NOTE — ED Provider Notes (Signed)
History    patient with history of asthma is present to the ED with chief complaints of shortness of breath. Patient states for the past month she has been having increased shortness of breath since she is out usual medication. Patient states her shortness of breath usually brought on by stress or exertion. She has an albuterol MDI which she uses but having to increase the usage for the past several days. She reduce her shortness of breath is related to the lack of medication. She denies fever, productive cough, or rash.  Patient also complaining of left upper extremity pain and numbness which radiates to the left side of the neck. Pain is described as throbbing, constant. Pain is been ongoing for the past 3 days. She denies any specific injury. She is right-hand dominant. Patient also states she has been having unexpected weight loss. approximately 40 pounds weight loss in the past year she also notice night sweats. She does have a significant family history of cancer. She is currently following up with a Dr. From [facility] for further evaluation of her complaint.  CSN: [id number] Arrival date & time: 08/20/2011  8:38 AM   First MD Initiated Contact with Patient 08/20/11 0915      Chief Complaint  Patient presents with  . Shortness of Breath    (Consider location/radiation/quality/duration/timing/severity/associated sxs/prior treatment) HPI  Past Medical History  Diagnosis Date  . Allergy     pollen, pcn  . Asthma   . Fibroids   . History of ovarian cyst   . Sickle cell anemia     has the trait  . Menometrorrhagia   . Sickle cell trait     Past Surgical History  Procedure Date  . Cesarean section     all three pregnancies    Family History  Problem Relation Age of Onset  . Cancer Mother     colon cancer  . Hypertension Mother   . Heart disease Father     History  Substance Use Topics  . Smoking status: Current Everyday Smoker -- 0.2 packs/day    Types: Cigarettes   . Smokeless tobacco: Never Used   Comment: cessation info given  . Alcohol Use: No    OB History    Grav Para Term Preterm Abortions TAB SAB Ect Mult Living   3 3        3       Review of Systems  All other systems reviewed and are negative.    Allergies  Penicillins and Pollen extract-tree extract  Home Medications   Current Outpatient Rx  Name Route Sig Dispense Refill  . ALBUTEROL 90 MCG/ACT IN AERS Inhalation Inhale 2 puffs into the lungs every 6 (six) hours as needed.      Marland Kitchen FLUTICASONE-SALMETEROL 500-50 MCG/DOSE IN AEPB Inhalation Inhale 1 puff into the lungs 2 (two) times daily.      Marland Kitchen LEVOCETIRIZINE DIHYDROCHLORIDE 5 MG PO TABS Oral Take 5 mg by mouth every morning.      Marland Kitchen OVER THE COUNTER MEDICATION Oral Take 1 tablet by mouth daily. Over the counter iron tablet.     . TRAMADOL HCL 50 MG PO TABS Oral Take 50 mg by mouth every 4 (four) hours as needed.        BP 123/81  Pulse 97  Temp(Src) 97.8 F (36.6 C) (Oral)  Resp 16  SpO2 97%  LMP 08/17/2011  Physical Exam  Nursing note and vitals reviewed. Constitutional: She appears well-developed and well-nourished.  Awake, alert, nontoxic appearance  HENT:  Head: Atraumatic.  Eyes: Conjunctivae are normal. Right eye exhibits no discharge. Left eye exhibits no discharge.  Neck: Normal range of motion. Neck supple. Muscular tenderness present. No spinous process tenderness present. Carotid bruit is not present. No rigidity. Normal range of motion present. No Brudzinski's sign and no Kernig's sign noted.  Cardiovascular: Normal rate and regular rhythm.   Pulmonary/Chest: Effort normal. No accessory muscle usage. No respiratory distress. She has decreased breath sounds. She has wheezes. She has no rales. She exhibits no tenderness.  Abdominal: There is no tenderness. There is no rebound.  Musculoskeletal: She exhibits no tenderness.       Baseline ROM, no obvious new focal weakness  Lymphadenopathy:    She has  no cervical adenopathy.  Neurological:       Mental status and motor strength appears baseline for patient and situation  Skin: No rash noted.  Psychiatric: She has a normal mood and affect.    ED Course  Procedures (including critical care time)  Labs Reviewed - No data to display No results found.   No diagnosis found.    MDM  Patient shortness of breath is likely related to lack of outpatient medication treatment. Therefore, I will refill her Advair. She does have albuterol MDI available. She will receive a breathing treatment here in the ED. Will obtain chest x-ray for further evaluation.  Patient appears to have symptoms consistent with cervical radiculopathy. His C-spine of the neck imaging will be obtained.  Patient has some symptoms concerning for cancer. However, she does have another provided that is currently monitoring and evaluating for it.        Fayrene Helper, PA 08/20/11 1205

## 2011-08-20 NOTE — ED Notes (Signed)
Patient presents with shortness of breath x 1 month and has been out of her asthma medications for approx 1 month as well.  Patient denies chest pain, no wheezing noted, no respiratory distress noted.

## 2011-10-29 ENCOUNTER — Ambulatory Visit: Payer: Self-pay | Admitting: Advanced Practice Midwife

## 2011-11-12 ENCOUNTER — Ambulatory Visit (INDEPENDENT_AMBULATORY_CARE_PROVIDER_SITE_OTHER): Payer: Self-pay | Admitting: Advanced Practice Midwife

## 2011-11-12 ENCOUNTER — Emergency Department (HOSPITAL_COMMUNITY)
Admission: EM | Admit: 2011-11-12 | Discharge: 2011-11-12 | Disposition: A | Discharge status: Home Health | Payer: Self-pay | Attending: Emergency Medicine | Admitting: Emergency Medicine

## 2011-11-12 ENCOUNTER — Inpatient Hospital Stay (HOSPITAL_COMMUNITY)
Admission: AD | Admit: 2011-11-12 | Discharge: 2011-11-12 | Disposition: A | Discharge status: Home / Self Care | Payer: Self-pay | Source: Ambulatory Visit | Attending: Obstetrics & Gynecology | Admitting: Obstetrics & Gynecology

## 2011-11-12 ENCOUNTER — Encounter (HOSPITAL_COMMUNITY): Payer: Self-pay | Admitting: *Deleted

## 2011-11-12 ENCOUNTER — Encounter: Payer: Self-pay | Admitting: Advanced Practice Midwife

## 2011-11-12 VITALS — BP 115/82 | HR 85 | Temp 98.4°F | Ht 61.0 in | Wt 160.0 lb

## 2011-11-12 DIAGNOSIS — J45909 Unspecified asthma, uncomplicated: Secondary | ICD-10-CM | POA: Insufficient documentation

## 2011-11-12 DIAGNOSIS — Z30431 Encounter for routine checking of intrauterine contraceptive device: Secondary | ICD-10-CM

## 2011-11-12 DIAGNOSIS — F17200 Nicotine dependence, unspecified, uncomplicated: Secondary | ICD-10-CM | POA: Insufficient documentation

## 2011-11-12 DIAGNOSIS — M79609 Pain in unspecified limb: Secondary | ICD-10-CM | POA: Insufficient documentation

## 2011-11-12 DIAGNOSIS — S8010XA Contusion of unspecified lower leg, initial encounter: Secondary | ICD-10-CM | POA: Insufficient documentation

## 2011-11-12 DIAGNOSIS — N921 Excessive and frequent menstruation with irregular cycle: Secondary | ICD-10-CM

## 2011-11-12 DIAGNOSIS — M79669 Pain in unspecified lower leg: Secondary | ICD-10-CM

## 2011-11-12 DIAGNOSIS — M7989 Other specified soft tissue disorders: Secondary | ICD-10-CM | POA: Insufficient documentation

## 2011-11-12 DIAGNOSIS — D573 Sickle-cell trait: Secondary | ICD-10-CM | POA: Insufficient documentation

## 2011-11-12 DIAGNOSIS — L299 Pruritus, unspecified: Secondary | ICD-10-CM | POA: Insufficient documentation

## 2011-11-12 DIAGNOSIS — F329 Major depressive disorder, single episode, unspecified: Secondary | ICD-10-CM | POA: Insufficient documentation

## 2011-11-12 DIAGNOSIS — T8383XA Hemorrhage of genitourinary prosthetic devices, implants and grafts, initial encounter: Secondary | ICD-10-CM

## 2011-11-12 DIAGNOSIS — Z79899 Other long term (current) drug therapy: Secondary | ICD-10-CM | POA: Insufficient documentation

## 2011-11-12 DIAGNOSIS — F3289 Other specified depressive episodes: Secondary | ICD-10-CM | POA: Insufficient documentation

## 2011-11-12 DIAGNOSIS — T148XXA Other injury of unspecified body region, initial encounter: Secondary | ICD-10-CM

## 2011-11-12 DIAGNOSIS — X58XXXA Exposure to other specified factors, initial encounter: Secondary | ICD-10-CM | POA: Insufficient documentation

## 2011-11-12 HISTORY — DX: Major depressive disorder, single episode, unspecified: F32.9

## 2011-11-12 HISTORY — DX: Depression, unspecified: F32.A

## 2011-11-12 LAB — BASIC METABOLIC PANEL
BUN: 8 mg/dL (ref 6–23)
CO2: 27 mEq/L (ref 19–32)
Chloride: 107 mEq/L (ref 96–112)
Creatinine, Ser: 0.83 mg/dL (ref 0.50–1.10)
GFR calc Af Amer: >90 mL/min (ref >90)

## 2011-11-12 LAB — CBC
HCT: 34.6 % — ABNORMAL LOW (ref 36.0–46.0)
MCV: 78.3 fL (ref 78.0–100.0)
RDW: 13.4 % (ref 11.5–15.5)
WBC: 9 10*3/uL (ref 4.0–10.5)

## 2011-11-12 MED ORDER — NORGESTIMATE-ETH ESTRADIOL 0.25-35 MG-MCG PO TABS: 1.0000 | ORAL_TABLET | Freq: Every day | ORAL | Status: DC

## 2011-11-12 MED ORDER — HYDROCODONE-ACETAMINOPHEN 5-325 MG PO TABS: 2.0000 | ORAL_TABLET | ORAL | Status: AC | PRN

## 2011-11-12 NOTE — Progress Notes (Signed)
*  PRELIMINARY RESULTS* Vascular Ultrasound Left lower extremity venous duplex has been completed.  Preliminary findings: Left= No evidence of Deep or superficial thrombus. A hematoma is seen medially from the upper calf to mid calf.  Farrel Demark RDMS 11/12/2011, 5:53 PM

## 2011-11-12 NOTE — ED Notes (Signed)
States sx x 3 days. Denies cp/sob.

## 2011-11-12 NOTE — Progress Notes (Signed)
Addended by: Dorathy Kinsman on: 11/12/2011 03:23 PM   Modules accepted: Orders

## 2011-11-12 NOTE — ED Provider Notes (Signed)
History     CSN: [id number]  Arrival date & time 11/12/11  1519   First MD Initiated Contact with Patient 11/12/11 1705      Chief Complaint  Patient presents with  . Leg Swelling    (Consider location/radiation/quality/duration/timing/severity/associated sxs/prior treatment) HPI  Pt presents to the ED with complaints of left calf swelling that had been persistent for 3 days. [facility] and referred to the ED for Rule Out DVT. The patient has a family history of DVT on her fathers side that ended up with an MI. The patient has an IUD, has not been on a prolonged trip, denies smoking. She denies shortness of breath,weakness, CP.   Past Medical History  Diagnosis Date  . Allergy     pollen, pcn  . Asthma   . Fibroids   . History of ovarian cyst   . Sickle cell anemia     has the trait  . Menometrorrhagia   . Sickle cell trait   . Depression     Past Surgical History  Procedure Date  . Cesarean section     all three pregnancies    Family History  Problem Relation Age of Onset  . Cancer Mother     colon cancer  . Hypertension Mother   . Heart disease Father   . Cancer Father     cancer    History  Substance Use Topics  . Smoking status: Current Everyday Smoker -- 0.2 packs/day    Types: Cigarettes  . Smokeless tobacco: Never Used   Comment: cessation info given  . Alcohol Use: No    OB History    Grav Para Term Preterm Abortions TAB SAB Ect Mult Living   3 3        3       Review of Systems  All other systems reviewed and are negative.    Allergies  Budeprion sr; Penicillins; and Pollen extract-tree extract  Home Medications   Current Outpatient Rx  Name Route Sig Dispense Refill  . ALBUTEROL 90 MCG/ACT IN AERS Inhalation Inhale 2 puffs into the lungs every 6 (six) hours as needed.      Marland Kitchen FLUTICASONE-SALMETEROL 500-50 MCG/DOSE IN AEPB Inhalation Inhale 1 puff into the lungs 2 (two) times daily.      Marland Kitchen  FLUTICASONE-SALMETEROL 500-50 MCG/DOSE IN AEPB Inhalation Inhale 2 puffs into the lungs 2 (two) times daily. 60 each 0  . LEVOCETIRIZINE DIHYDROCHLORIDE 5 MG PO TABS Oral Take 5 mg by mouth every morning.      Marland Kitchen OVER THE COUNTER MEDICATION Oral Take 1 tablet by mouth daily. Over the counter iron tablet.     . TRAMADOL HCL 50 MG PO TABS Oral Take 50 mg by mouth every 4 (four) hours as needed. For pain.    Marland Kitchen HYDROCODONE-ACETAMINOPHEN 5-325 MG PO TABS Oral Take 2 tablets by mouth every 4 (four) hours as needed for pain. 6 tablet 0    BP 117/79  Pulse 83  Temp(Src) 98.3 F (36.8 C) (Oral)  Resp 16  SpO2 98%  LMP 08/17/2011  Physical Exam  Nursing note and vitals reviewed. Constitutional: She appears well-developed and well-nourished. No distress.  HENT:  Head: Normocephalic and atraumatic.  Eyes: Pupils are equal, round, and reactive to light.  Neck: Normal range of motion. Neck supple.  Cardiovascular: Normal rate and regular rhythm.   Pulmonary/Chest: Effort normal.  Abdominal: Soft.  Musculoskeletal:       Left lower leg: She  exhibits tenderness and swelling (no redness or induration). She exhibits no bony tenderness, no edema, no deformity and no laceration.  Neurological: She is alert.  Skin: Skin is warm and dry.    ED Course  Procedures (including critical care time)   Labs Reviewed  CBC  BASIC METABOLIC PANEL   No results found.   1. Hematoma   2. Calf pain       MDM   *PRELIMINARY RESULTS*  Vascular Ultrasound  Left lower extremity venous duplex has been completed. Preliminary findings: Left= No evidence of Deep or superficial thrombus. A hematoma is seen medially from the upper calf to mid calf.  Farrel Demark RDMS  11/12/2011, 5:53 PM   pts pain due to hematoma. Pt has been informed that she has no blood clot in her veins at this time, however if symptoms change or worsen its warrants further evaluation. Pt is stable and understands plan at this time which  is pain management and icing the sore area.       Dorthula Matas, PA 11/12/11 [phone]

## 2011-11-12 NOTE — ED Provider Notes (Signed)
Medical screening examination/treatment/procedure(s) were performed by non-physician practitioner and as supervising physician I was immediately available for consultation/collaboration.   Gwyneth Sprout, MD 11/12/11 [phone]

## 2011-11-12 NOTE — ED Provider Notes (Signed)
History   Pt presents today c/o an allergic reaction to a medication. She states she was started on Wellbutrin and began taking the medication about 3-4 days ago. Since that time she has developed itching to her arms and abd. She denies SOB, CP, or fever. She does also c/o left calf pain that began about 3 days ago. She states her left calf is swollen and extremely tender to touch. She now finds it difficult to walk.  Chief Complaint  Patient presents with  . Allergic Reaction   HPI  OB History    Grav Para Term Preterm Abortions TAB SAB Ect Mult Living   3 3        3       Past Medical History  Diagnosis Date  . Allergy     pollen, pcn  . Asthma   . Fibroids   . History of ovarian cyst   . Sickle cell anemia     has the trait  . Menometrorrhagia   . Sickle cell trait   . Depression     Past Surgical History  Procedure Date  . Cesarean section     all three pregnancies    Family History  Problem Relation Age of Onset  . Cancer Mother     colon cancer  . Hypertension Mother   . Heart disease Father   . Cancer Father     cancer    History  Substance Use Topics  . Smoking status: Current Everyday Smoker -- 0.2 packs/day    Types: Cigarettes  . Smokeless tobacco: Never Used   Comment: cessation info given  . Alcohol Use: No    Allergies:  Allergies  Allergen Reactions  . Budeprion Sr   . Penicillins Hives  . Pollen Extract-Tree Extract     Allergies.    Prescriptions prior to admission  Medication Sig Dispense Refill  . albuterol (PROVENTIL,VENTOLIN) 90 MCG/ACT inhaler Inhale 2 puffs into the lungs every 6 (six) hours as needed.        . Fluticasone-Salmeterol (ADVAIR DISKUS) 500-50 MCG/DOSE AEPB Inhale 1 puff into the lungs 2 (two) times daily.        . Fluticasone-Salmeterol (ADVAIR DISKUS) 500-50 MCG/DOSE AEPB Inhale 2 puffs into the lungs 2 (two) times daily.  60 each  0  . levocetirizine (XYZAL) 5 MG tablet Take 5 mg by mouth every morning.          . norgestimate-ethinyl estradiol (ORTHO-CYCLEN,SPRINTEC,PREVIFEM) 0.25-35 MG-MCG tablet Take 1 tablet by mouth daily.  1 Package  2  . OVER THE COUNTER MEDICATION Take 1 tablet by mouth daily. Over the counter iron tablet.       . traMADol (ULTRAM) 50 MG tablet Take 50 mg by mouth every 4 (four) hours as needed.          Review of Systems  Constitutional: Negative for fever and chills.  HENT: Negative for hearing loss.   Eyes: Negative for blurred vision and double vision.  Respiratory: Negative for cough, hemoptysis, sputum production, shortness of breath and wheezing.   Cardiovascular: Positive for leg swelling. Negative for chest pain, palpitations, orthopnea, claudication and PND.  Gastrointestinal: Negative for nausea, vomiting, abdominal pain, diarrhea and constipation.  Genitourinary: Negative for dysuria, urgency, frequency and hematuria.  Skin: Positive for itching.  Neurological: Negative for dizziness and headaches.  Psychiatric/Behavioral: Negative for depression and suicidal ideas.   Physical Exam   Blood pressure 117/77, pulse 84, temperature 97.6 F (36.4 C), temperature source Oral, resp.  rate 18, last menstrual period 08/17/2011.  Physical Exam  Nursing note and vitals reviewed. Constitutional: She is oriented to person, place, and time. She appears well-developed and well-nourished. No distress.  HENT:  Head: Normocephalic and atraumatic.  Eyes: EOM are normal. Pupils are equal, round, and reactive to light.  GI: Soft. She exhibits no distension and no mass. There is no tenderness. There is no rebound and no guarding.  Musculoskeletal:       Left lower leg: She exhibits tenderness, swelling and edema.  Neurological: She is alert and oriented to person, place, and time.  Skin: Skin is warm and dry. No rash noted. She is not diaphoretic. No erythema.  Psychiatric: She has a normal mood and affect. Her behavior is normal. Judgment and thought content normal.     MAU Course  Procedures  Rt calf measures 15cm. Lt calf measures 15.5cm and is tender to palpation. No erythema or fever noted.  Discussed pt with Dr. Debroah Loop. Will send pt to [facility] ED for evaluation for possible DVT. Pt is stable. She has a friend driving her and agrees to go directly to the ED.  Spoke with [facility] ED. Dr. Jodelle Gross is the EDP and will see pt.  Assessment and Plan  Rt Calf pain: discussed with pt at length. Advised pt to not take birth control pills that she was prescribed earlier today. She understands that she should go directly to the ED. She understood and agreed. Also advised pt to dc her Wellbutrin and take benadryl for itching. She will f/u with [facility] to discuss alternatives for depression medication. Discussed diet, activity, risks, and precautions.   Clinton Gallant. Raeley Gilmore III, DrHSc, MPAS, PA-C  11/12/2011, 2:41 PM   Henrietta Hoover, PA 11/12/11 1456

## 2011-11-12 NOTE — ED Notes (Signed)
To ed for eval of left calf swelling and pain. No sob noted.

## 2011-11-12 NOTE — Progress Notes (Signed)
Went to [facility], was given medication for depression- 01/07, was not taking it every day ( obtained as could pay for it). Was her 3rd dose- started having all the side effects/ symptoms- itching is the worst problem.  Also having swelling in left calf.   Had appt downstairs (GYN clinic) for mirena check.  Was told to come up here.

## 2011-11-12 NOTE — Progress Notes (Signed)
  Subjective:    Patient ID: Domingo Dimes, female    DOB: Oct 22, 1970, 41 y.o.   MRN: [id number]  HPI Pt is here for spotting w/ Mirena. It was placed 02/2011 for Menometrorrhagia. She did not have spotting initially, but started spotting two months ago and has spotted continuously. She denies change in partner, abd pain, fever, chills or vaginal discharge.   Review of Systems: Otherwise neg    Objective:   Physical Exam  Constitutional: She is oriented to person, place, and time. She appears well-developed and well-nourished. No distress.  Cardiovascular: Normal rate.   Pulmonary/Chest: Effort normal.  Abdominal: Soft. There is no tenderness.  Genitourinary: Uterus is not enlarged and not tender. Cervix exhibits no motion tenderness, no discharge and no friability. Right adnexum displays no mass, no tenderness and no fullness. Left adnexum displays no mass, no tenderness and no fullness. There is bleeding (small amount of thin, brownish-red bleeding.) around the vagina. No vaginal discharge found.       String seen. Device not felt or seen protruding through os.  Lymphadenopathy:       Right: No inguinal adenopathy present.       Left: No inguinal adenopathy present.  Neurological: She is alert and oriented to person, place, and time.  Skin: Skin is warm and dry.  Psychiatric: She has a normal mood and affect.       Assessment & Plan:  Spotting w/ Mirena  Rx Sprintec x 3 months. No Hx blood clots or HTN. Clotting precautions reviewed. F/U PRN if no improvement  Dorathy Kinsman 11/12/2011 2:06 PM

## 2011-11-12 NOTE — Progress Notes (Signed)
Pt came up from GYN clinic, pt states she has had itching on arms, lower abdomen, groin for last 2 days.  Pt also C/O swelling in L calf for past 2 days, states it feels tight.  R calf measures 15 inches, L calf measures 15 1/2 inches.

## 2011-11-13 LAB — WET PREP, GENITAL: Yeast Wet Prep HPF POC: NONE SEEN

## 2012-01-11 ENCOUNTER — Emergency Department (HOSPITAL_COMMUNITY): Payer: Self-pay

## 2012-01-11 ENCOUNTER — Encounter (HOSPITAL_COMMUNITY): Payer: Self-pay | Admitting: *Deleted

## 2012-01-11 ENCOUNTER — Emergency Department (HOSPITAL_COMMUNITY)
Admission: EM | Admit: 2012-01-11 | Discharge: 2012-01-11 | Disposition: A | Discharge status: Home / Self Care | Payer: Self-pay | Attending: Emergency Medicine | Admitting: Emergency Medicine

## 2012-01-11 DIAGNOSIS — H539 Unspecified visual disturbance: Secondary | ICD-10-CM | POA: Insufficient documentation

## 2012-01-11 DIAGNOSIS — D573 Sickle-cell trait: Secondary | ICD-10-CM | POA: Insufficient documentation

## 2012-01-11 DIAGNOSIS — J0100 Acute maxillary sinusitis, unspecified: Secondary | ICD-10-CM | POA: Insufficient documentation

## 2012-01-11 DIAGNOSIS — R51 Headache: Secondary | ICD-10-CM | POA: Insufficient documentation

## 2012-01-11 MED ORDER — HYDROCODONE-ACETAMINOPHEN 5-325 MG PO TABS: 1.0000 | ORAL_TABLET | ORAL | Status: AC | PRN

## 2012-01-11 MED ORDER — PROCHLORPERAZINE EDISYLATE 5 MG/ML IJ SOLN
10.0000 mg | Freq: Four times a day (QID) | INTRAMUSCULAR | Status: DC | PRN
  Administered 2012-01-11: 10 mg via INTRAVENOUS
  Filled 2012-01-11: qty 2

## 2012-01-11 MED ORDER — SODIUM CHLORIDE 0.9 % IV BOLUS (SEPSIS)
1000.0000 mL | Freq: Once | INTRAVENOUS | Status: AC
  Administered 2012-01-11: 1000 mL via INTRAVENOUS

## 2012-01-11 MED ORDER — DEXAMETHASONE SODIUM PHOSPHATE 10 MG/ML IJ SOLN
10.0000 mg | Freq: Once | INTRAMUSCULAR | Status: AC
  Administered 2012-01-11: 10 mg via INTRAVENOUS
  Filled 2012-01-11: qty 1

## 2012-01-11 MED ORDER — PSEUDOEPHEDRINE HCL 60 MG PO TABS: 60.0000 mg | ORAL_TABLET | ORAL | Status: AC | PRN

## 2012-01-11 MED ORDER — DIPHENHYDRAMINE HCL 50 MG/ML IJ SOLN
25.0000 mg | Freq: Once | INTRAMUSCULAR | Status: AC
  Administered 2012-01-11: 25 mg via INTRAVENOUS
  Filled 2012-01-11: qty 1

## 2012-01-11 MED ORDER — DOXYCYCLINE HYCLATE 100 MG PO CAPS: 100.0000 mg | ORAL_CAPSULE | Freq: Two times a day (BID) | ORAL | Status: AC

## 2012-01-11 MED ORDER — HYDROMORPHONE HCL PF 1 MG/ML IJ SOLN
0.5000 mg | Freq: Once | INTRAMUSCULAR | Status: AC
  Administered 2012-01-11: 0.5 mg via INTRAVENOUS
  Filled 2012-01-11: qty 1

## 2012-01-11 NOTE — ED Provider Notes (Signed)
History     CSN: [id number]  Arrival date & time 01/11/12  9147   First MD Initiated Contact with Patient 01/11/12 0825      Chief Complaint  Patient presents with  . Migraine    (Consider location/radiation/quality/duration/timing/severity/associated sxs/prior treatment) HPI Comments: Patient resents with a seven-day history of migraine-like headache.  It has been waxing and waning in character and sharp and located across her bilateral forehead.  Her headache started gradually one week ago.  She has had photophobia and phonophobia, denies scotoma, but has noticed slightly blurred vision intermittently with near vision.  She had a distant history of migraine headaches, but states it has been over 5 years since she has had one.  She denies nausea and vomiting, no fevers, no recent illnesses.  She denies any weakness or numbness, dizziness and denies any neck pain.  Patient is a 41 y.o. female presenting with migraine. The history is provided by the patient.  Migraine Associated symptoms include headaches. Pertinent negatives include no abdominal pain, arthralgias, chest pain, congestion, fever, joint swelling, nausea, neck pain, numbness, rash, sore throat or weakness.    Past Medical History  Diagnosis Date  . Allergy     pollen, pcn  . Asthma   . Fibroids   . History of ovarian cyst   . Sickle cell anemia     has the trait  . Menometrorrhagia   . Sickle cell trait   . Depression     Past Surgical History  Procedure Date  . Cesarean section     all three pregnancies    Family History  Problem Relation Age of Onset  . Cancer Mother     colon cancer  . Hypertension Mother   . Heart disease Father   . Cancer Father     cancer    History  Substance Use Topics  . Smoking status: Current Everyday Smoker -- 0.2 packs/day    Types: Cigarettes  . Smokeless tobacco: Never Used   Comment: cessation info given  . Alcohol Use: No    OB History    Grav Para Term  Preterm Abortions TAB SAB Ect Mult Living   3 3        3       Review of Systems  Constitutional: Negative for fever.  HENT: Negative for congestion, sore throat, neck pain and neck stiffness.   Eyes: Positive for photophobia and visual disturbance.  Respiratory: Negative for chest tightness and shortness of breath.   Cardiovascular: Negative for chest pain.  Gastrointestinal: Negative for nausea and abdominal pain.  Genitourinary: Negative.   Musculoskeletal: Negative for joint swelling and arthralgias.  Skin: Negative.  Negative for rash and wound.  Neurological: Positive for headaches. Negative for dizziness, weakness, light-headedness and numbness.  Hematological: Negative.   Psychiatric/Behavioral: Negative.     Allergies  Budeprion sr; Penicillins; and Pollen extract-tree extract  Home Medications   Current Outpatient Rx  Name Route Sig Dispense Refill  . ALBUTEROL 90 MCG/ACT IN AERS Inhalation Inhale 2 puffs into the lungs every 6 (six) hours as needed.      Marland Kitchen FLUTICASONE-SALMETEROL 500-50 MCG/DOSE IN AEPB Inhalation Inhale 1 puff into the lungs 2 (two) times daily.      Marland Kitchen FLUTICASONE-SALMETEROL 500-50 MCG/DOSE IN AEPB Inhalation Inhale 2 puffs into the lungs 2 (two) times daily. 60 each 0  . LEVOCETIRIZINE DIHYDROCHLORIDE 5 MG PO TABS Oral Take 5 mg by mouth every morning.      Marland Kitchen OVER THE  COUNTER MEDICATION Oral Take 1 tablet by mouth daily. Over the counter iron tablet.     . TRAMADOL HCL 50 MG PO TABS Oral Take 50 mg by mouth every 4 (four) hours as needed. For pain.    Marland Kitchen DOXYCYCLINE HYCLATE 100 MG PO CAPS Oral Take 1 capsule (100 mg total) by mouth 2 (two) times daily. 20 capsule 0  . HYDROCODONE-ACETAMINOPHEN 5-325 MG PO TABS Oral Take 1 tablet by mouth every 4 (four) hours as needed for pain. 15 tablet 0    BP 105/66  Pulse 67  Temp(Src) 98.2 F (36.8 C) (Oral)  Resp 20  SpO2 98%  LMP 12/16/2011  Physical Exam  Nursing note and vitals  reviewed. Constitutional: She is oriented to person, place, and time. She appears well-developed and well-nourished.       Uncomfortable appearing  HENT:  Head: Normocephalic and atraumatic.  Mouth/Throat: Oropharynx is clear and moist.  Eyes: EOM are normal. Pupils are equal, round, and reactive to light.  Neck: Normal range of motion. Neck supple.  Cardiovascular: Normal rate and normal heart sounds.   Pulmonary/Chest: Effort normal.  Abdominal: Soft. There is no tenderness.  Musculoskeletal: Normal range of motion.  Lymphadenopathy:    She has no cervical adenopathy.  Neurological: She is alert and oriented to person, place, and time. She has normal strength. No sensory deficit. Gait normal. GCS eye subscore is 4. GCS verbal subscore is 5. GCS motor subscore is 6.       Normal heel-shin, normal rapid alternating movements. Cranial nerves III-XII intact.  No pronator drift.  Skin: Skin is warm and dry. No rash noted.  Psychiatric: She has a normal mood and affect. Her speech is normal and behavior is normal. Thought content normal. Cognition and memory are normal.    ED Course  Procedures (including critical care time)  Labs Reviewed - No data to display Ct Head Wo Contrast  01/11/2012  *RADIOLOGY REPORT*  Clinical Data: Headache for 6 days.  CT HEAD WITHOUT CONTRAST  Technique:  Contiguous axial images were obtained from the base of the skull through the vertex without contrast.  Comparison: None.  Findings: The brain appears normal without evidence of infarction, hemorrhage, mass lesion, mass effect, midline shift or abnormal extra-axial fluid collection.  There is partial visualization of extensive opacification of the left maxillary sinus with bubbly secretions present.  Visualized paranasal sinuses and mastoid air cells are otherwise clear.  IMPRESSION:  1.  No acute finding. 2.  Marked left maxillary sinus disease is partially visualized.  Original Report Authenticated By: Bernadene Bell.  D'ALESSIO, M.D.     1. Sinusitis, acute maxillary     Patient was given normal saline 1 L along with migraine cocktail including Benadryl 25 mg, Compazine 10 mg and Decadron 10 mg per IV.  Headache has improved from 10 out of 10-7/10.  CT scan ordered.  MDM  Acute left maxillary sinusitis.  Patient was prescribed doxycycline 100 mg twice a day since she is penicillin allergic.  Also prescribed hydrocodone for pain relief, Sudafed for decongestants.  Discussed followup care if patient's symptoms are not improved or if they worsen in any way.        Burgess Amor, PA 01/11/12 1222  Burgess Amor, PA 01/11/12 [phone]

## 2012-01-11 NOTE — Discharge Instructions (Signed)
Sinusitis Sinuses are air pockets within the bones of your face. The growth of bacteria within a sinus leads to infection. The infection prevents the sinuses from draining. This infection is called sinusitis. SYMPTOMS  There will be different areas of pain depending on which sinuses have become infected.  The maxillary sinuses often produce pain beneath the eyes.   Frontal sinusitis may cause pain in the middle of the forehead and above the eyes.  Other problems (symptoms) include:  Toothaches.   Colored, pus-like (purulent) drainage from the nose.   Swelling, warmth, and tenderness over the sinus areas may be signs of infection.  TREATMENT  Sinusitis is most often determined by an exam.X-rays may be taken. If x-rays have been taken, make sure you obtain your results or find out how you are to obtain them. Your caregiver may give you medications (antibiotics). These are medications that will help kill the bacteria causing the infection. You may also be given a medication (decongestant) that helps to reduce sinus swelling.  HOME CARE INSTRUCTIONS   Only take over-the-counter or prescription medicines for pain, discomfort, or fever as directed by your caregiver.   Drink extra fluids. Fluids help thin the mucus so your sinuses can drain more easily.   Applying either moist heat or ice packs to the sinus areas may help relieve discomfort.   Use saline nasal sprays to help moisten your sinuses. The sprays can be found at your local drugstore.  SEEK IMMEDIATE MEDICAL CARE IF:  You have a fever.   You have increasing pain, severe headaches, or toothache.   You have nausea, vomiting, or drowsiness.   You develop unusual swelling around the face or trouble seeing.  MAKE SURE YOU:   Understand these instructions.   Will watch your condition.   Will get help right away if you are not doing well or get worse.  Document Released: 09/03/2005 Document Revised: 08/23/2011 Document Reviewed:  04/02/2007 [facility] Patient Information 2012 [place], [place].  Make sure you take the entire course of doxycycline.  You may use hydrocodone for pain relief however this medicine will make you drowsy, do not drive within 4 hours of taking this pain pill.

## 2012-01-11 NOTE — ED Notes (Signed)
Pt here for migraine that began Saturday.  Pt has photophobia and sensitivity to sound with this.  No nausea and no neuro deficits.

## 2012-01-11 NOTE — ED Provider Notes (Signed)
Medical screening examination/treatment/procedure(s) were performed by non-physician practitioner and as supervising physician I was immediately available for consultation/collaboration.  Zykeria Laguardia, MD 01/11/12 1709 

## 2012-05-05 ENCOUNTER — Inpatient Hospital Stay (HOSPITAL_COMMUNITY)
Admission: AD | Admit: 2012-05-05 | Discharge: 2012-05-05 | Disposition: A | Discharge status: Home / Self Care | Payer: Self-pay | Source: Ambulatory Visit | Attending: Obstetrics & Gynecology | Admitting: Obstetrics & Gynecology

## 2012-05-05 ENCOUNTER — Encounter (HOSPITAL_COMMUNITY): Payer: Self-pay

## 2012-05-05 DIAGNOSIS — N949 Unspecified condition associated with female genital organs and menstrual cycle: Secondary | ICD-10-CM | POA: Insufficient documentation

## 2012-05-05 DIAGNOSIS — N390 Urinary tract infection, site not specified: Secondary | ICD-10-CM | POA: Insufficient documentation

## 2012-05-05 DIAGNOSIS — A499 Bacterial infection, unspecified: Secondary | ICD-10-CM | POA: Insufficient documentation

## 2012-05-05 DIAGNOSIS — N760 Acute vaginitis: Secondary | ICD-10-CM | POA: Insufficient documentation

## 2012-05-05 DIAGNOSIS — N939 Abnormal uterine and vaginal bleeding, unspecified: Secondary | ICD-10-CM

## 2012-05-05 DIAGNOSIS — B9689 Other specified bacterial agents as the cause of diseases classified elsewhere: Secondary | ICD-10-CM | POA: Insufficient documentation

## 2012-05-05 DIAGNOSIS — N938 Other specified abnormal uterine and vaginal bleeding: Secondary | ICD-10-CM | POA: Insufficient documentation

## 2012-05-05 LAB — URINALYSIS, ROUTINE W REFLEX MICROSCOPIC
Bilirubin Urine: NEGATIVE
Glucose, UA: NEGATIVE mg/dL
Ketones, ur: NEGATIVE mg/dL
pH: 7 (ref 5.0–8.0)

## 2012-05-05 LAB — WET PREP, GENITAL: Trich, Wet Prep: NONE SEEN

## 2012-05-05 LAB — URINE MICROSCOPIC-ADD ON

## 2012-05-05 LAB — POCT PREGNANCY, URINE: Preg Test, Ur: NEGATIVE

## 2012-05-05 MED ORDER — METRONIDAZOLE 500 MG PO TABS: 500.0000 mg | ORAL_TABLET | Freq: Two times a day (BID) | ORAL | Status: AC

## 2012-05-05 MED ORDER — NITROFURANTOIN MONOHYD MACRO 100 MG PO CAPS: 100.0000 mg | ORAL_CAPSULE | Freq: Two times a day (BID) | ORAL | Status: AC

## 2012-05-05 NOTE — MAU Note (Signed)
Patient states she has had vaginal bleeding and abdominal pain for about 3 days. Has had a Mirena for about 1 1/2 years. Will wake up in the middle of the night with a lot of bleeding but only spotting at this time. Burning with urination for 2 days. Pain with intercourse at times.

## 2012-05-05 NOTE — MAU Note (Signed)
Pt states IUD placed 1 year ago via [facility], states is having lower back pain that wraps around to r side of her lower abdomen to where her cyst is. Has been bleeding x3 weeks. Went off for 1 day, then returned "out of nowhere", is bleeding heavily when getting up in middle of night.

## 2012-05-05 NOTE — MAU Provider Note (Signed)
History     CSN: [id number]  Arrival date and time: 05/05/12 1641   None     Chief Complaint  Patient presents with  . Abdominal Pain  . Vaginal Bleeding   HPI [name] is 41 y.o. G3P3 presents with lower back pain that radiates around to her lower abdomen.  BTL for contraception.  Hx of ovarian cyst and irregular bleeding.   Has Mirena IUD, inserted at [facility] 1 year.  Last seen in the clinic 4 months ago.  Has monthly bleeding with the IUD.  LMP 7/14 X 10 days then  bleeding began 3 days ago, stopped for 1 day and restarted at 3am yesterday.  1 partner.   Patient of [facility] and is looking for a new doctor.     Past Medical History  Diagnosis Date  . Allergy     pollen, pcn  . Asthma   . Fibroids   . History of ovarian cyst   . Sickle cell anemia     has the trait  . Menometrorrhagia   . Sickle cell trait   . Depression     Past Surgical History  Procedure Date  . Cesarean section     all three pregnancies    Family History  Problem Relation Age of Onset  . Cancer Mother     colon cancer  . Hypertension Mother   . Heart disease Father   . Cancer Father     cancer    History  Substance Use Topics  . Smoking status: Current Everyday Smoker -- 0.2 packs/day    Types: Cigarettes  . Smokeless tobacco: Never Used   Comment: cessation info given  . Alcohol Use: No    Allergies:  Allergies  Allergen Reactions  . Bupropion Hcl Er (Sr) Hives and Other (See Comments)    The patient has an entire page of reactions that can be caused by the medication, but none that she has suffered.  Marland Kitchen Penicillins Hives  . Pollen Extract-Tree Extract     Allergies.    Prescriptions prior to admission  Medication Sig Dispense Refill  . albuterol (PROVENTIL,VENTOLIN) 90 MCG/ACT inhaler Inhale 2 puffs into the lungs every 6 (six) hours as needed.        . Fluticasone-Salmeterol (ADVAIR DISKUS) 500-50 MCG/DOSE AEPB Inhale 1 puff into the lungs 2 (two) times  daily.        Marland Kitchen levocetirizine (XYZAL) 5 MG tablet Take 5 mg by mouth every morning.          Review of Systems  Constitutional: Negative for fever and chills.  Gastrointestinal: Positive for abdominal pain (lower abdomen). Negative for nausea and vomiting.  Genitourinary:       + vaginal bleeding ? mirena out of place--feels like it is in the side of the vagina  Musculoskeletal: Positive for back pain (lower ).  Neurological: Negative for headaches.   Physical Exam   Blood pressure 102/69, pulse 86, temperature 98.6 F (37 C), temperature source Oral, resp. rate 16, height 5\' 1"  (1.549 m), weight 66.769 kg (147 lb 3.2 oz), last menstrual period 05/02/2012, SpO2 100.00%.  Physical Exam  Constitutional: She is oriented to person, place, and time. She appears well-developed and well-nourished. No distress.  HENT:  Head: Normocephalic.  Neck: Normal range of motion.  Cardiovascular: Normal rate.   Respiratory: Effort normal.  GI: Soft. She exhibits no distension and no mass. There is no tenderness (suprapubic). There is no rebound and no  guarding.  Genitourinary: Uterus is tender (suprapubic tenderness). Cervix exhibits no motion tenderness. Right adnexum displays no mass, no tenderness and no fullness. Left adnexum displays no mass, no tenderness and no fullness. There is bleeding (small amount of thin brown blood in vaginal with odor.  No clots or active bleeding) around the vagina. No vaginal discharge found.  Neurological: She is alert and oriented to person, place, and time.  Skin: Skin is warm and dry.  Psychiatric: She has a normal mood and affect. Her behavior is normal.   Results for orders placed during the hospital encounter of 05/05/12 (from the past 24 hour(s))  URINALYSIS, ROUTINE W REFLEX MICROSCOPIC     Status: Abnormal   Collection Time   05/05/12  5:00 PM      Component Value Range   Color, Urine YELLOW  YELLOW   APPearance CLOUDY (*) CLEAR   Specific Gravity,  Urine 1.015  1.005 - 1.030   pH 7.0  5.0 - 8.0   Glucose, UA NEGATIVE  NEGATIVE mg/dL   Hgb urine dipstick SMALL (*) NEGATIVE   Bilirubin Urine NEGATIVE  NEGATIVE   Ketones, ur NEGATIVE  NEGATIVE mg/dL   Protein, ur NEGATIVE  NEGATIVE mg/dL   Urobilinogen, UA 1.0  0.0 - 1.0 mg/dL   Nitrite POSITIVE (*) NEGATIVE   Leukocytes, UA TRACE (*) NEGATIVE  URINE MICROSCOPIC-ADD ON     Status: Abnormal   Collection Time   05/05/12  5:00 PM      Component Value Range   Squamous Epithelial / LPF RARE  RARE   WBC, UA 0-2  <3 WBC/hpf   RBC / HPF 0-2  <3 RBC/hpf   Bacteria, UA MANY (*) RARE  POCT PREGNANCY, URINE     Status: Normal   Collection Time   05/05/12  5:05 PM      Component Value Range   Preg Test, Ur NEGATIVE  NEGATIVE  WET PREP, GENITAL     Status: Abnormal   Collection Time   05/05/12  6:41 PM      Component Value Range   Yeast Wet Prep HPF POC NONE SEEN  NONE SEEN   Trich, Wet Prep NONE SEEN  NONE SEEN   Clue Cells Wet Prep HPF POC MODERATE (*) NONE SEEN   WBC, Wet Prep HPF POC FEW (*) NONE SEEN    MAU Course  Procedures  MDM Patient states she is ready for hysterectomy.  This option was discussed during previous visit but wanted to try IUD first.  Strong family hx of cancer, per her report.  Will refer back to GYN CLINIC for follow up discussion   Assessment and Plan  A:  Urinary Tract Infection      Bacterial vaginosis     Abnormal vaginal bleeding  P:  Rx for Flagyl and Macrobid      The GYN CLINIC will call to make follow up appointment       Ethyle Tiedt,EVE M 05/05/2012, 5:24 PM

## 2012-05-05 NOTE — MAU Note (Signed)
Notes burning and urgency with voiding, whenever voiding pt checks strings of IUD, and states cervix feels puffy like everything is closing. Skin feels rubbed raw from new pads she's been using.

## 2012-05-06 NOTE — MAU Provider Note (Signed)
Medical Screening exam and patient care preformed by advanced practice provider.  Agree with the above management.  

## 2012-07-09 ENCOUNTER — Encounter (HOSPITAL_COMMUNITY): Payer: Self-pay | Admitting: Nurse Practitioner

## 2012-07-09 ENCOUNTER — Emergency Department (HOSPITAL_COMMUNITY): Payer: Self-pay

## 2012-07-09 ENCOUNTER — Emergency Department (HOSPITAL_COMMUNITY)
Admission: EM | Admit: 2012-07-09 | Discharge: 2012-07-09 | Disposition: A | Discharge status: Home / Self Care | Payer: Self-pay | Attending: Emergency Medicine | Admitting: Emergency Medicine

## 2012-07-09 DIAGNOSIS — J45901 Unspecified asthma with (acute) exacerbation: Secondary | ICD-10-CM | POA: Insufficient documentation

## 2012-07-09 DIAGNOSIS — Y9389 Activity, other specified: Secondary | ICD-10-CM | POA: Insufficient documentation

## 2012-07-09 DIAGNOSIS — W010XXA Fall on same level from slipping, tripping and stumbling without subsequent striking against object, initial encounter: Secondary | ICD-10-CM | POA: Insufficient documentation

## 2012-07-09 DIAGNOSIS — Y9289 Other specified places as the place of occurrence of the external cause: Secondary | ICD-10-CM | POA: Insufficient documentation

## 2012-07-09 DIAGNOSIS — J45909 Unspecified asthma, uncomplicated: Secondary | ICD-10-CM

## 2012-07-09 DIAGNOSIS — Z79899 Other long term (current) drug therapy: Secondary | ICD-10-CM | POA: Insufficient documentation

## 2012-07-09 DIAGNOSIS — D573 Sickle-cell trait: Secondary | ICD-10-CM | POA: Insufficient documentation

## 2012-07-09 DIAGNOSIS — F17200 Nicotine dependence, unspecified, uncomplicated: Secondary | ICD-10-CM | POA: Insufficient documentation

## 2012-07-09 DIAGNOSIS — Z87448 Personal history of other diseases of urinary system: Secondary | ICD-10-CM | POA: Insufficient documentation

## 2012-07-09 DIAGNOSIS — S4980XA Other specified injuries of shoulder and upper arm, unspecified arm, initial encounter: Secondary | ICD-10-CM | POA: Insufficient documentation

## 2012-07-09 DIAGNOSIS — M25512 Pain in left shoulder: Secondary | ICD-10-CM

## 2012-07-09 DIAGNOSIS — S46909A Unspecified injury of unspecified muscle, fascia and tendon at shoulder and upper arm level, unspecified arm, initial encounter: Secondary | ICD-10-CM | POA: Insufficient documentation

## 2012-07-09 MED ORDER — ALBUTEROL SULFATE (5 MG/ML) 0.5% IN NEBU
5.0000 mg | INHALATION_SOLUTION | Freq: Once | RESPIRATORY_TRACT | Status: AC
  Administered 2012-07-09: 5 mg via RESPIRATORY_TRACT
  Filled 2012-07-09: qty 1

## 2012-07-09 MED ORDER — ALBUTEROL SULFATE HFA 108 (90 BASE) MCG/ACT IN AERS
2.0000 | INHALATION_SPRAY | RESPIRATORY_TRACT | Status: DC | PRN
  Administered 2012-07-09: 2 via RESPIRATORY_TRACT
  Filled 2012-07-09: qty 6.7

## 2012-07-09 MED ORDER — HYDROCODONE-ACETAMINOPHEN 5-325 MG PO TABS: 1.0000 | ORAL_TABLET | ORAL | Status: DC | PRN

## 2012-07-09 MED ORDER — IPRATROPIUM BROMIDE 0.02 % IN SOLN
0.5000 mg | Freq: Once | RESPIRATORY_TRACT | Status: AC
  Administered 2012-07-09: 0.5 mg via RESPIRATORY_TRACT
  Filled 2012-07-09: qty 2.5

## 2012-07-09 MED ORDER — FLUTICASONE-SALMETEROL 500-50 MCG/DOSE IN AEPB: 1.0000 | INHALATION_SPRAY | Freq: Two times a day (BID) | RESPIRATORY_TRACT | Status: DC

## 2012-07-09 NOTE — ED Provider Notes (Signed)
History     CSN: [id number]  Arrival date & time 07/09/12  2046   First MD Initiated Contact with Patient 07/09/12 2102      Chief Complaint  Patient presents with  . Shoulder Pain    (Consider location/radiation/quality/duration/timing/severity/associated sxs/prior treatment) HPI History provided by pt.   Pt slipped on soap in her bathroom 4 days ago and fell, landing on her left shoulder.  Did not hit head.  C/o pain posterior left shoulder/left upper back that is aggravated by movement and deep inspiration.  Feels like she can not get a good breath.  Pain started immediately after trauma.  Denies neck and low back pain.  Also c/o asthma exacerbation.  Was a [facility] pt and ran out of advair and albuterol 2 days ago.  Has been experiencing wheezing and coughing.  Denies fever and chest pain.   Past Medical History  Diagnosis Date  . Allergy     pollen, pcn  . Asthma   . Fibroids   . History of ovarian cyst   . Sickle cell anemia     has the trait  . Menometrorrhagia   . Sickle cell trait   . Depression     Past Surgical History  Procedure Date  . Cesarean section     all three pregnancies    Family History  Problem Relation Age of Onset  . Cancer Mother     colon cancer  . Hypertension Mother   . Heart disease Father   . Cancer Father     cancer  . Other Neg Hx     History  Substance Use Topics  . Smoking status: Current Some Day Smoker -- 0.2 packs/day    Types: Cigarettes  . Smokeless tobacco: Never Used   Comment: cessation info given  . Alcohol Use: No    OB History    Grav Para Term Preterm Abortions TAB SAB Ect Mult Living   3 3 0       3      Review of Systems  All other systems reviewed and are negative.    Allergies  Bupropion hcl er (sr); Penicillins; and Pollen extract-tree extract  Home Medications   Current Outpatient Rx  Name Route Sig Dispense Refill  . ALBUTEROL 90 MCG/ACT IN AERS Inhalation Inhale 2 puffs into the  lungs every 6 (six) hours as needed. For asthma    . FLUOXETINE HCL 20 MG PO CAPS Oral Take 20 mg by mouth daily.    Marland Kitchen FLUTICASONE-SALMETEROL 500-50 MCG/DOSE IN AEPB Inhalation Inhale 1 puff into the lungs 2 (two) times daily.      Marland Kitchen LEVOCETIRIZINE DIHYDROCHLORIDE 5 MG PO TABS Oral Take 5 mg by mouth every morning.        BP 115/66  Pulse 76  Temp 98.8 F (37.1 C) (Oral)  Resp 14  Ht 5\' 1"  (1.549 m)  Wt 135 lb (61.236 kg)  BMI 25.51 kg/m2  SpO2 100%  Physical Exam  Nursing note and vitals reviewed. Constitutional: She is oriented to person, place, and time. She appears well-developed and well-nourished. No distress.  HENT:  Head: Normocephalic and atraumatic.  Eyes:       Normal appearance  Neck: Normal range of motion.  Cardiovascular: Normal rate and regular rhythm.   Pulmonary/Chest: Effort normal and breath sounds normal. No respiratory distress.       Mild expiratory wheezing bilateral upper lung fields.  Pleuritic pain in left upper back reported  Musculoskeletal:  Normal range of motion.       Tenderness over left scapula and left trap.  Pain w/ passive flexion and abduction LUE beyond 90deg.  No tenderness of anterior shoulder.  Nml elbow and wrist exam.  Distal NV intact.   Neurological: She is alert and oriented to person, place, and time.  Skin: Skin is warm and dry. No rash noted.  Psychiatric: She has a normal mood and affect. Her behavior is normal.    ED Course  Procedures (including critical care time)  Labs Reviewed - No data to display Dg Chest 2 View  07/09/2012  *RADIOLOGY REPORT*  Clinical Data: History of fall complaining of shoulder pain.  Chest pain.  Shortness of breath.  CHEST - 2 VIEW  Comparison: Chest x-ray 08/20/2011.  Findings: Lung volumes are normal.  No consolidative airspace disease.  No pleural effusions.  No pneumothorax.  No pulmonary nodule or mass noted.  Pulmonary vasculature and the cardiomediastinal silhouette are within normal limits.   IMPRESSION: 1. No radiographic evidence of acute cardiopulmonary disease.   Original Report Authenticated By: Florencia Reasons, M.D.      1. Pain in left shoulder   2. Asthma       MDM  41yo F presents w/ c/o pleuritic left upper back pain that started immediately following mechanical fall 4 days ago, as well as asthma (out of meds x 2 days b/c [facility] closed).  On exam, VS w/in nml range and shoulder pain reproducible w/ palpation and ROM of LUE.  No concern for PE based on history and exam.  CXR pending to r/o rib or scapula fx and pneumo.  Wheezing bilateral upper lung fields; no coughing or respiratory distress.  Pt to receive breathing treatment. 9:30 PM   CXR neg.  Spoke w/ radiologist and no obvious abnormality of L scapula.  Results discussed w/ pt.  She reports improvement in SOB.  Wheezing resolved on re-examination.  Pt received an albuterol inhaler, prescribed her home dose of advair as well as vicodin for shoulder pain.  Return precautions discussed.  10:44 PM        Otilio Miu, PA 07/09/12 [phone]

## 2012-07-09 NOTE — ED Notes (Signed)
PA at bedside.

## 2012-07-09 NOTE — Progress Notes (Signed)
Orthopedic Tech Progress Note Patient Details:  [name] Jun 10, 1971 [id number]  Ortho Devices Type of Ortho Device: Arm foam sling Ortho Device/Splint Location: (L) UE Ortho Device/Splint Interventions: Application   Jennye Moccasin 07/09/2012, 10:52 PM

## 2012-07-09 NOTE — ED Notes (Signed)
Pt. Fell shoulder first in the bathroom on Sunday. Denies LOC. Pain in left shoulder that has been unrelieved with tylenol and rest x3 days.

## 2012-07-15 NOTE — ED Provider Notes (Signed)
Medical screening examination/treatment/procedure(s) were performed by non-physician practitioner and as supervising physician I was immediately available for consultation/collaboration.  Ha Shannahan, MD 07/15/12 2304 

## 2012-09-01 ENCOUNTER — Emergency Department (HOSPITAL_COMMUNITY)
Admission: EM | Admit: 2012-09-01 | Discharge: 2012-09-01 | Disposition: A | Discharge status: Home / Self Care | Payer: Self-pay | Attending: Emergency Medicine | Admitting: Emergency Medicine

## 2012-09-01 ENCOUNTER — Encounter (HOSPITAL_COMMUNITY): Payer: Self-pay | Admitting: *Deleted

## 2012-09-01 DIAGNOSIS — Z8742 Personal history of other diseases of the female genital tract: Secondary | ICD-10-CM | POA: Insufficient documentation

## 2012-09-01 DIAGNOSIS — F329 Major depressive disorder, single episode, unspecified: Secondary | ICD-10-CM | POA: Insufficient documentation

## 2012-09-01 DIAGNOSIS — Z862 Personal history of diseases of the blood and blood-forming organs and certain disorders involving the immune mechanism: Secondary | ICD-10-CM | POA: Insufficient documentation

## 2012-09-01 DIAGNOSIS — F3289 Other specified depressive episodes: Secondary | ICD-10-CM | POA: Insufficient documentation

## 2012-09-01 DIAGNOSIS — M2550 Pain in unspecified joint: Secondary | ICD-10-CM | POA: Insufficient documentation

## 2012-09-01 DIAGNOSIS — M25519 Pain in unspecified shoulder: Secondary | ICD-10-CM | POA: Insufficient documentation

## 2012-09-01 DIAGNOSIS — Z79899 Other long term (current) drug therapy: Secondary | ICD-10-CM | POA: Insufficient documentation

## 2012-09-01 DIAGNOSIS — J45909 Unspecified asthma, uncomplicated: Secondary | ICD-10-CM | POA: Insufficient documentation

## 2012-09-01 DIAGNOSIS — K5900 Constipation, unspecified: Secondary | ICD-10-CM | POA: Insufficient documentation

## 2012-09-01 DIAGNOSIS — F17200 Nicotine dependence, unspecified, uncomplicated: Secondary | ICD-10-CM | POA: Insufficient documentation

## 2012-09-01 DIAGNOSIS — G8929 Other chronic pain: Secondary | ICD-10-CM | POA: Insufficient documentation

## 2012-09-01 MED ORDER — HYDROCODONE-ACETAMINOPHEN 5-325 MG PO TABS
2.0000 | ORAL_TABLET | Freq: Once | ORAL | Status: AC
  Administered 2012-09-01: 2 via ORAL
  Filled 2012-09-01: qty 2

## 2012-09-01 MED ORDER — HYDROCODONE-ACETAMINOPHEN 5-325 MG PO TABS: 1.0000 | ORAL_TABLET | ORAL | Status: DC | PRN
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
## 2012-09-13 ENCOUNTER — Emergency Department (HOSPITAL_COMMUNITY)
Admission: EM | Admit: 2012-09-13 | Discharge: 2012-09-13 | Disposition: A | Discharge status: Home / Self Care | Payer: Self-pay | Attending: Emergency Medicine | Admitting: Emergency Medicine

## 2012-09-13 ENCOUNTER — Encounter (HOSPITAL_COMMUNITY): Payer: Self-pay

## 2012-09-13 DIAGNOSIS — J45901 Unspecified asthma with (acute) exacerbation: Secondary | ICD-10-CM | POA: Insufficient documentation

## 2012-09-13 DIAGNOSIS — Z87828 Personal history of other (healed) physical injury and trauma: Secondary | ICD-10-CM | POA: Insufficient documentation

## 2012-09-13 DIAGNOSIS — M25512 Pain in left shoulder: Secondary | ICD-10-CM

## 2012-09-13 DIAGNOSIS — J45909 Unspecified asthma, uncomplicated: Secondary | ICD-10-CM

## 2012-09-13 DIAGNOSIS — F3289 Other specified depressive episodes: Secondary | ICD-10-CM | POA: Insufficient documentation

## 2012-09-13 DIAGNOSIS — F17200 Nicotine dependence, unspecified, uncomplicated: Secondary | ICD-10-CM | POA: Insufficient documentation

## 2012-09-13 DIAGNOSIS — M25519 Pain in unspecified shoulder: Secondary | ICD-10-CM | POA: Insufficient documentation

## 2012-09-13 DIAGNOSIS — F329 Major depressive disorder, single episode, unspecified: Secondary | ICD-10-CM | POA: Insufficient documentation

## 2012-09-13 DIAGNOSIS — Z8742 Personal history of other diseases of the female genital tract: Secondary | ICD-10-CM | POA: Insufficient documentation

## 2012-09-13 DIAGNOSIS — Z79899 Other long term (current) drug therapy: Secondary | ICD-10-CM | POA: Insufficient documentation

## 2012-09-13 DIAGNOSIS — D573 Sickle-cell trait: Secondary | ICD-10-CM | POA: Insufficient documentation

## 2012-09-13 MED ORDER — ALBUTEROL SULFATE (5 MG/ML) 0.5% IN NEBU
2.5000 mg | INHALATION_SOLUTION | Freq: Once | RESPIRATORY_TRACT | Status: AC
  Administered 2012-09-13: 2.5 mg via RESPIRATORY_TRACT
  Filled 2012-09-13: qty 0.5

## 2012-09-13 MED ORDER — FLUTICASONE-SALMETEROL 500-50 MCG/DOSE IN AEPB: 1.0000 | INHALATION_SPRAY | Freq: Every day | RESPIRATORY_TRACT | Status: DC

## 2012-09-13 MED ORDER — HYDROCODONE-ACETAMINOPHEN 5-325 MG PO TABS: 1.0000 | ORAL_TABLET | Freq: Four times a day (QID) | ORAL | Status: DC | PRN

## 2012-09-13 MED ORDER — OXYCODONE-ACETAMINOPHEN 5-325 MG PO TABS
2.0000 | ORAL_TABLET | Freq: Once | ORAL | Status: AC
  Administered 2012-09-13: 2 via ORAL
  Filled 2012-09-13: qty 2

## 2012-09-13 MED ORDER — METHYLPREDNISOLONE SODIUM SUCC 125 MG IJ SOLR: 125.0000 mg | Freq: Once | INTRAMUSCULAR | Status: DC

## 2012-09-13 MED ORDER — ALBUTEROL SULFATE HFA 108 (90 BASE) MCG/ACT IN AERS
2.0000 | INHALATION_SPRAY | Freq: Once | RESPIRATORY_TRACT | Status: DC
  Filled 2012-09-13: qty 6.7
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
## 2012-09-14 NOTE — ED Provider Notes (Signed)
Medical screening examination/treatment/procedure(s) were performed by non-physician practitioner and as supervising physician I was immediately available for consultation/collaboration.  Gilda Crease, MD 09/14/12 [phone]

## 2012-09-27 ENCOUNTER — Emergency Department (HOSPITAL_COMMUNITY)
Admission: EM | Admit: 2012-09-27 | Discharge: 2012-09-27 | Disposition: A | Discharge status: Home / Self Care | Payer: Self-pay | Attending: Emergency Medicine | Admitting: Emergency Medicine

## 2012-09-27 ENCOUNTER — Encounter (HOSPITAL_COMMUNITY): Payer: Self-pay | Admitting: Emergency Medicine

## 2012-09-27 DIAGNOSIS — M549 Dorsalgia, unspecified: Secondary | ICD-10-CM | POA: Insufficient documentation

## 2012-09-27 DIAGNOSIS — F3289 Other specified depressive episodes: Secondary | ICD-10-CM | POA: Insufficient documentation

## 2012-09-27 DIAGNOSIS — F329 Major depressive disorder, single episode, unspecified: Secondary | ICD-10-CM | POA: Insufficient documentation

## 2012-09-27 DIAGNOSIS — M62838 Other muscle spasm: Secondary | ICD-10-CM

## 2012-09-27 DIAGNOSIS — IMO0002 Reserved for concepts with insufficient information to code with codable children: Secondary | ICD-10-CM | POA: Insufficient documentation

## 2012-09-27 DIAGNOSIS — Z862 Personal history of diseases of the blood and blood-forming organs and certain disorders involving the immune mechanism: Secondary | ICD-10-CM | POA: Insufficient documentation

## 2012-09-27 DIAGNOSIS — G8929 Other chronic pain: Secondary | ICD-10-CM

## 2012-09-27 DIAGNOSIS — F17200 Nicotine dependence, unspecified, uncomplicated: Secondary | ICD-10-CM | POA: Insufficient documentation

## 2012-09-27 DIAGNOSIS — Z79899 Other long term (current) drug therapy: Secondary | ICD-10-CM | POA: Insufficient documentation

## 2012-09-27 DIAGNOSIS — J45909 Unspecified asthma, uncomplicated: Secondary | ICD-10-CM | POA: Insufficient documentation

## 2012-09-27 DIAGNOSIS — M5380 Other specified dorsopathies, site unspecified: Secondary | ICD-10-CM | POA: Insufficient documentation

## 2012-09-27 DIAGNOSIS — Z8742 Personal history of other diseases of the female genital tract: Secondary | ICD-10-CM | POA: Insufficient documentation

## 2012-09-27 DIAGNOSIS — G8921 Chronic pain due to trauma: Secondary | ICD-10-CM | POA: Insufficient documentation

## 2012-09-27 MED ORDER — OXYCODONE-ACETAMINOPHEN 5-325 MG PO TABS
1.0000 | ORAL_TABLET | Freq: Once | ORAL | Status: AC
  Administered 2012-09-27: 1 via ORAL
  Filled 2012-09-27: qty 1

## 2012-09-27 MED ORDER — CYCLOBENZAPRINE HCL 10 MG PO TABS: 10.0000 mg | ORAL_TABLET | Freq: Two times a day (BID) | ORAL | Status: DC | PRN

## 2012-09-27 MED ORDER — HYDROCODONE-ACETAMINOPHEN 5-325 MG PO TABS: 1.0000 | ORAL_TABLET | ORAL | Status: DC | PRN

## 2012-09-27 MED ORDER — KETOROLAC TROMETHAMINE 60 MG/2ML IM SOLN
60.0000 mg | Freq: Once | INTRAMUSCULAR | Status: AC
  Administered 2012-09-27: 60 mg via INTRAMUSCULAR
  Filled 2012-09-27: qty 2

## 2012-09-27 NOTE — ED Provider Notes (Signed)
Medical screening examination/treatment/procedure(s) were performed by non-physician practitioner and as supervising physician I was immediately available for consultation/collaboration. Devoria Albe, MD, Armando Gang   Ward Givens, MD 09/27/12 [phone]
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
## 2012-09-27 NOTE — ED Notes (Signed)
Pt. Stated, I sarted having back pain this am

## 2012-10-13 ENCOUNTER — Encounter (HOSPITAL_COMMUNITY): Payer: Self-pay | Admitting: Emergency Medicine

## 2012-10-13 ENCOUNTER — Emergency Department (HOSPITAL_COMMUNITY)
Admission: EM | Admit: 2012-10-13 | Discharge: 2012-10-13 | Disposition: A | Discharge status: Home / Self Care | Payer: Self-pay | Attending: Emergency Medicine | Admitting: Emergency Medicine

## 2012-10-13 DIAGNOSIS — M545 Low back pain, unspecified: Secondary | ICD-10-CM | POA: Insufficient documentation

## 2012-10-13 DIAGNOSIS — F3289 Other specified depressive episodes: Secondary | ICD-10-CM | POA: Insufficient documentation

## 2012-10-13 DIAGNOSIS — R0602 Shortness of breath: Secondary | ICD-10-CM | POA: Insufficient documentation

## 2012-10-13 DIAGNOSIS — Z8742 Personal history of other diseases of the female genital tract: Secondary | ICD-10-CM | POA: Insufficient documentation

## 2012-10-13 DIAGNOSIS — Z862 Personal history of diseases of the blood and blood-forming organs and certain disorders involving the immune mechanism: Secondary | ICD-10-CM | POA: Insufficient documentation

## 2012-10-13 DIAGNOSIS — J45909 Unspecified asthma, uncomplicated: Secondary | ICD-10-CM | POA: Insufficient documentation

## 2012-10-13 DIAGNOSIS — F329 Major depressive disorder, single episode, unspecified: Secondary | ICD-10-CM | POA: Insufficient documentation

## 2012-10-13 DIAGNOSIS — F17200 Nicotine dependence, unspecified, uncomplicated: Secondary | ICD-10-CM | POA: Insufficient documentation

## 2012-10-13 DIAGNOSIS — Z760 Encounter for issue of repeat prescription: Secondary | ICD-10-CM | POA: Insufficient documentation

## 2012-10-13 DIAGNOSIS — G8929 Other chronic pain: Secondary | ICD-10-CM | POA: Insufficient documentation

## 2012-10-13 MED ORDER — ALBUTEROL SULFATE HFA 108 (90 BASE) MCG/ACT IN AERS: 2.0000 | INHALATION_SPRAY | RESPIRATORY_TRACT | Status: DC | PRN

## 2012-10-13 MED ORDER — TRAMADOL HCL 50 MG PO TABS: 50.0000 mg | ORAL_TABLET | Freq: Four times a day (QID) | ORAL | Status: DC | PRN

## 2012-10-13 MED ORDER — ALBUTEROL SULFATE HFA 108 (90 BASE) MCG/ACT IN AERS
2.0000 | INHALATION_SPRAY | Freq: Once | RESPIRATORY_TRACT | Status: AC
  Administered 2012-10-13: 2 via RESPIRATORY_TRACT
  Filled 2012-10-13: qty 6.7

## 2012-10-13 NOTE — ED Provider Notes (Signed)
Medical screening examination/treatment/procedure(s) were performed by non-physician practitioner and as supervising physician I was immediately available for consultation/collaboration.   Omega Slager III, MD 10/13/12 2019 

## 2012-10-13 NOTE — ED Notes (Signed)
Patient claims she is out of her albuterol inhaler.  She needs prescription for same.  Patient claims that she treats her chronic back pain at home with tylenol and ibuprofen, but claims is did not work yesterday.  Patient denies taking any pain medication today, because "nothing helps".
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
## 2012-10-13 NOTE — ED Provider Notes (Signed)
History     CSN: [id number]  Arrival date & time 10/13/12  1052   First MD Initiated Contact with Patient 10/13/12 1205      No chief complaint on file.   (Consider location/radiation/quality/duration/timing/severity/associated sxs/prior treatment) HPI  [name] is a 42 y.o. female complaining of laceration of chronic low back pain and asthma. Patient ran out of her albuterol inhaler last night. She denies trauma, wheezing, chest pain, fever.   Past Medical History  Diagnosis Date  . Allergy     pollen, pcn  . Asthma   . Fibroids   . History of ovarian cyst   . Sickle cell anemia     has the trait  . Menometrorrhagia   . Sickle cell trait   . Depression     Past Surgical History  Procedure Date  . Cesarean section     all three pregnancies    Family History  Problem Relation Age of Onset  . Cancer Mother     colon cancer  . Hypertension Mother   . Heart disease Father   . Cancer Father     cancer  . Other Neg Hx     History  Substance Use Topics  . Smoking status: Current Some Day Smoker -- 0.2 packs/day    Types: Cigarettes  . Smokeless tobacco: Never Used     Comment: cessation info given  . Alcohol Use: No    OB History    Grav Para Term Preterm Abortions TAB SAB Ect Mult Living   3 3 0       3      Review of Systems  Constitutional: Negative for fever.  Respiratory: Positive for shortness of breath. Negative for wheezing.   Cardiovascular: Negative for chest pain.  Gastrointestinal: Negative for nausea, vomiting, abdominal pain and diarrhea.  Musculoskeletal: Positive for back pain.  All other systems reviewed and are negative.    Allergies  Bupropion hcl er (sr); Penicillins; and Pollen extract-tree extract  Home Medications   Current Outpatient Rx  Name  Route  Sig  Dispense  Refill  . ALBUTEROL 90 MCG/ACT IN AERS   Inhalation   Inhale 2 puffs into the lungs every 6 (six) hours as needed. For shortness of breath         . CYCLOBENZAPRINE HCL 10 MG PO TABS   Oral   Take 1 tablet (10 mg total) by mouth 2 (two) times daily as needed for muscle spasms.   20 tablet   0   . FLUOXETINE HCL 20 MG PO CAPS   Oral   Take 20 mg by mouth daily.         Marland Kitchen FLUTICASONE-SALMETEROL 500-50 MCG/DOSE IN AEPB   Inhalation   Inhale 1 puff into the lungs 2 (two) times daily.         Marland Kitchen HYDROCODONE-ACETAMINOPHEN 5-325 MG PO TABS   Oral   Take 1 tablet by mouth every 4 (four) hours as needed for pain.   10 tablet   0   . IBUPROFEN 200 MG PO TABS   Oral   Take 400 mg by mouth every 8 (eight) hours as needed. For pain         . LEVOCETIRIZINE DIHYDROCHLORIDE 5 MG PO TABS   Oral   Take 5 mg by mouth every morning.           Marland Kitchen TRAMADOL HCL 50 MG PO TABS   Oral   Take 50 mg by  mouth daily as needed. As needed for pain.           BP 96/61  Pulse 82  Temp 98.6 F (37 C)  Resp 16  SpO2 100%  LMP 09/23/2012  Physical Exam  Nursing note and vitals reviewed. Constitutional: She is oriented to person, place, and time. She appears well-developed and well-nourished. No distress.  HENT:  Head: Normocephalic.  Mouth/Throat: Oropharynx is clear and moist.  Eyes: Conjunctivae normal and EOM are normal. Pupils are equal, round, and reactive to light.  Neck: Normal range of motion.  Cardiovascular: Normal rate and regular rhythm.   Pulmonary/Chest: Effort normal and breath sounds normal. No stridor. No respiratory distress. She has no wheezes. She has no rales. She exhibits no tenderness.       Patient reclining comfortably, no tachypnea, no stridor, no wheezing, good air movement in all fields.  Musculoskeletal: Normal range of motion.  Neurological: She is alert and oriented to person, place, and time.  Psychiatric: She has a normal mood and affect.    ED Course  Procedures (including critical care time)  Labs Reviewed - No data to display No results found.   1. Chronic pain   2. Medication refill        MDM  Agencies that she is tried to get an appointment at the 4 uninsured [facility] the [facility]. She states she's not been able to make an appointment because they're booked. Advised her to keep trying.    Pt verbalized understanding and agrees with care plan. Outpatient follow-up and return precautions given.    New Prescriptions   ALBUTEROL (PROVENTIL HFA;VENTOLIN HFA) 108 (90 BASE) MCG/ACT INHALER    Inhale 2 puffs into the lungs every 2 (two) hours as needed for wheezing or shortness of breath (cough).   TRAMADOL (ULTRAM) 50 MG TABLET    Take 1 tablet (50 mg total) by mouth every 6 (six) hours as needed for pain.          Wynetta Emery, PA-C 10/13/12 1320

## 2012-10-23 ENCOUNTER — Telehealth (HOSPITAL_COMMUNITY): Payer: Self-pay | Admitting: Emergency Medicine

## 2012-11-08 ENCOUNTER — Emergency Department (HOSPITAL_COMMUNITY)
Admission: EM | Admit: 2012-11-08 | Discharge: 2012-11-08 | Disposition: A | Discharge status: Home / Self Care | Payer: Self-pay | Attending: Emergency Medicine | Admitting: Emergency Medicine

## 2012-11-08 ENCOUNTER — Encounter (HOSPITAL_COMMUNITY): Payer: Self-pay | Admitting: *Deleted

## 2012-11-08 DIAGNOSIS — J45909 Unspecified asthma, uncomplicated: Secondary | ICD-10-CM | POA: Insufficient documentation

## 2012-11-08 DIAGNOSIS — H9209 Otalgia, unspecified ear: Secondary | ICD-10-CM | POA: Insufficient documentation

## 2012-11-08 DIAGNOSIS — Z8742 Personal history of other diseases of the female genital tract: Secondary | ICD-10-CM | POA: Insufficient documentation

## 2012-11-08 DIAGNOSIS — Z79899 Other long term (current) drug therapy: Secondary | ICD-10-CM | POA: Insufficient documentation

## 2012-11-08 DIAGNOSIS — IMO0002 Reserved for concepts with insufficient information to code with codable children: Secondary | ICD-10-CM | POA: Insufficient documentation

## 2012-11-08 DIAGNOSIS — M542 Cervicalgia: Secondary | ICD-10-CM | POA: Insufficient documentation

## 2012-11-08 DIAGNOSIS — F3289 Other specified depressive episodes: Secondary | ICD-10-CM | POA: Insufficient documentation

## 2012-11-08 DIAGNOSIS — R1310 Dysphagia, unspecified: Secondary | ICD-10-CM | POA: Insufficient documentation

## 2012-11-08 DIAGNOSIS — F17200 Nicotine dependence, unspecified, uncomplicated: Secondary | ICD-10-CM | POA: Insufficient documentation

## 2012-11-08 DIAGNOSIS — R220 Localized swelling, mass and lump, head: Secondary | ICD-10-CM | POA: Insufficient documentation

## 2012-11-08 DIAGNOSIS — K089 Disorder of teeth and supporting structures, unspecified: Secondary | ICD-10-CM | POA: Insufficient documentation

## 2012-11-08 MED ORDER — OXYCODONE-ACETAMINOPHEN 5-325 MG PO TABS
2.0000 | ORAL_TABLET | Freq: Once | ORAL | Status: AC
  Administered 2012-11-08: 2 via ORAL
  Filled 2012-11-08: qty 2

## 2012-11-08 MED ORDER — CLINDAMYCIN HCL 150 MG PO CAPS: 150.0000 mg | ORAL_CAPSULE | Freq: Four times a day (QID) | ORAL | Status: DC

## 2012-11-08 NOTE — ED Provider Notes (Signed)
I have personally seen and examined the patient.  I have discussed the plan of care with the resident.  I have reviewed the documentation on PMH/FH/Soc. History.  I have reviewed the documentation of the resident and agree.   Tylique Aull W Woodfin Kiss, MD 11/08/12 2333 

## 2012-11-08 NOTE — ED Notes (Signed)
Pt c/o right lower dental pain on the back 2 teeth x 3 days.  STates she has not seen dentist about this before.

## 2012-11-08 NOTE — ED Provider Notes (Signed)
History     CSN: [id number]  Arrival date & time 11/08/12  0251   First MD Initiated Contact with Patient 11/08/12 [phone]      Chief Complaint  Patient presents with  . Dental Pain    HPI Comments: Pt states she has had right, posterior, lower jaw pain for the last 3 days now with ear and neck pain. She has tried OTC medications with no relief. She states she has a broken tooth there. She has not been to see a dentist. Has not established a PCP yet.   Patient is a 42 y.o. female presenting with tooth pain. The history is provided by the patient.  Dental PainThe primary symptoms include mouth pain. Primary symptoms do not include headaches, fever or shortness of breath. The symptoms began 3 to 5 days ago. The symptoms are worsening. The symptoms are new. The symptoms occur constantly.  Additional symptoms include: dental sensitivity to temperature, gum swelling, gum tenderness, trouble swallowing and ear pain. Additional symptoms do not include: drooling.    Past Medical History  Diagnosis Date  . Allergy     pollen, pcn  . Asthma   . Fibroids   . History of ovarian cyst   . Sickle cell anemia     has the trait  . Menometrorrhagia   . Sickle cell trait   . Depression     Past Surgical History  Procedure Laterality Date  . Cesarean section      all three pregnancies    Family History  Problem Relation Age of Onset  . Cancer Mother     colon cancer  . Hypertension Mother   . Heart disease Father   . Cancer Father     cancer  . Other Neg Hx     History  Substance Use Topics  . Smoking status: Current Some Day Smoker -- 0.20 packs/day    Types: Cigarettes  . Smokeless tobacco: Never Used     Comment: cessation info given  . Alcohol Use: No    OB History   Grav Para Term Preterm Abortions TAB SAB Ect Mult Living   3 3 0       3      Review of Systems  Constitutional: Negative for fever and chills.  HENT: Positive for ear pain and trouble swallowing.  Negative for congestion and drooling.   Respiratory: Negative for shortness of breath.   Cardiovascular: Negative for chest pain.  Gastrointestinal: Negative for abdominal pain.  Genitourinary: Negative for dysuria.  Skin: Negative for rash.  Neurological: Negative for headaches.  All other systems reviewed and are negative.    Allergies  Bupropion hcl er (sr); Penicillins; and Pollen extract-tree extract  Home Medications   Current Outpatient Rx  Name  Route  Sig  Dispense  Refill  . albuterol (PROVENTIL HFA;VENTOLIN HFA) 108 (90 BASE) MCG/ACT inhaler   Inhalation   Inhale 2 puffs into the lungs every 2 (two) hours as needed for wheezing or shortness of breath (cough).   1 Inhaler   3   . cyclobenzaprine (FLEXERIL) 10 MG tablet   Oral   Take 1 tablet (10 mg total) by mouth 2 (two) times daily as needed for muscle spasms.   20 tablet   0   . FLUoxetine (PROZAC) 20 MG capsule   Oral   Take 20 mg by mouth daily.         . Fluticasone-Salmeterol (ADVAIR) 500-50 MCG/DOSE AEPB   Inhalation  Inhale 1 puff into the lungs 2 (two) times daily.         Marland Kitchen ibuprofen (ADVIL,MOTRIN) 200 MG tablet   Oral   Take 400 mg by mouth every 8 (eight) hours as needed. For pain         . traMADol (ULTRAM) 50 MG tablet   Oral   Take 1 tablet (50 mg total) by mouth every 6 (six) hours as needed for pain.   15 tablet   0   . clindamycin (CLEOCIN) 150 MG capsule   Oral   Take 1 capsule (150 mg total) by mouth every 6 (six) hours.   28 capsule   0     BP 124/77  Pulse 78  Temp(Src) 98.1 F (36.7 C) (Oral)  Resp 18  SpO2 97%  Physical Exam  Constitutional: She appears well-developed and well-nourished. She appears distressed.  HENT:  Head: Normocephalic and atraumatic.  Right Ear: Tympanic membrane and ear canal normal.  Left Ear: Tympanic membrane and ear canal normal.  Nose: Nose normal.  Mouth/Throat: Uvula is midline and oropharynx is clear and moist. No oral  lesions. Abnormal dentition (Broken tooth in tooth #31 with surrounding erythema and mild swelling).  TTP right cheek and jaw without obvious swelling  Cardiovascular: Normal rate and regular rhythm.   Pulmonary/Chest: Effort normal and breath sounds normal.    ED Course  Procedures (including critical care time)  Labs Reviewed - No data to display No results found.   1. Pain, dental     MDM  42 yo F with dental pain likely secondary to broken molar.  Given Clindamycin to take, and she should take her home pain medications. Follow up with dentist for further evaluation. D/c home in stable condition.     Hilarie Fredrickson, MD 11/08/12 [phone]

## 2012-11-09 DIAGNOSIS — K089 Disorder of teeth and supporting structures, unspecified: Secondary | ICD-10-CM | POA: Insufficient documentation

## 2012-11-09 DIAGNOSIS — J45909 Unspecified asthma, uncomplicated: Secondary | ICD-10-CM | POA: Insufficient documentation

## 2012-11-09 DIAGNOSIS — Z8709 Personal history of other diseases of the respiratory system: Secondary | ICD-10-CM | POA: Insufficient documentation

## 2012-11-09 DIAGNOSIS — K029 Dental caries, unspecified: Secondary | ICD-10-CM | POA: Insufficient documentation

## 2012-11-09 DIAGNOSIS — Z8742 Personal history of other diseases of the female genital tract: Secondary | ICD-10-CM | POA: Insufficient documentation

## 2012-11-09 DIAGNOSIS — Z8659 Personal history of other mental and behavioral disorders: Secondary | ICD-10-CM | POA: Insufficient documentation

## 2012-11-09 DIAGNOSIS — F17200 Nicotine dependence, unspecified, uncomplicated: Secondary | ICD-10-CM | POA: Insufficient documentation

## 2012-11-09 DIAGNOSIS — Z79899 Other long term (current) drug therapy: Secondary | ICD-10-CM | POA: Insufficient documentation

## 2012-11-09 DIAGNOSIS — D573 Sickle-cell trait: Secondary | ICD-10-CM | POA: Insufficient documentation

## 2012-11-09 DIAGNOSIS — IMO0002 Reserved for concepts with insufficient information to code with codable children: Secondary | ICD-10-CM | POA: Insufficient documentation

## 2012-11-09 DIAGNOSIS — Z862 Personal history of diseases of the blood and blood-forming organs and certain disorders involving the immune mechanism: Secondary | ICD-10-CM | POA: Insufficient documentation

## 2012-11-09 DIAGNOSIS — Z765 Malingerer [conscious simulation]: Secondary | ICD-10-CM | POA: Insufficient documentation

## 2012-11-09 DIAGNOSIS — F3289 Other specified depressive episodes: Secondary | ICD-10-CM | POA: Insufficient documentation

## 2012-11-09 DIAGNOSIS — R509 Fever, unspecified: Secondary | ICD-10-CM | POA: Insufficient documentation

## 2012-11-10 ENCOUNTER — Encounter (HOSPITAL_COMMUNITY): Payer: Self-pay | Admitting: Emergency Medicine

## 2012-11-10 ENCOUNTER — Emergency Department (HOSPITAL_COMMUNITY)
Admission: EM | Admit: 2012-11-10 | Discharge: 2012-11-10 | Disposition: A | Discharge status: Home / Self Care | Payer: Self-pay | Attending: Emergency Medicine | Admitting: Emergency Medicine

## 2012-11-10 MED ORDER — OXYCODONE-ACETAMINOPHEN 5-325 MG PO TABS
1.0000 | ORAL_TABLET | Freq: Once | ORAL | Status: AC
  Administered 2012-11-10: 1 via ORAL
  Filled 2012-11-10: qty 1

## 2012-11-10 NOTE — ED Notes (Signed)
Seen in ED on 2/22 for toothache.  Taking antibiotics and OTC pain medication without any relief.

## 2012-11-11 NOTE — ED Provider Notes (Signed)
Medical screening examination/treatment/procedure(s) were performed by non-physician practitioner and as supervising physician I was immediately available for consultation/collaboration.  Dequane Strahan, MD 11/11/12 0752 

## 2012-11-11 NOTE — ED Provider Notes (Signed)
History     CSN: [id number]  Arrival date & time 11/09/12  2355   First MD Initiated Contact with Patient 11/10/12 0102      Chief Complaint  Patient presents with  . Dental Pain    (Consider location/radiation/quality/duration/timing/severity/associated sxs/prior treatment) Patient is a 42 y.o. female presenting with tooth pain. The history is provided by the patient and a friend. No language interpreter was used.  Dental PainThe primary symptoms include mouth pain and fever. Primary symptoms do not include shortness of breath or sore throat. The symptoms began more than 1 month ago. The symptoms are worsening. The symptoms occur intermittently.  Additional symptoms include: dental sensitivity to temperature. Additional symptoms do not include: gum swelling, gum tenderness and trouble swallowing.    Past Medical History  Diagnosis Date  . Allergy     pollen, pcn  . Asthma   . Fibroids   . History of ovarian cyst   . Sickle cell anemia     has the trait  . Menometrorrhagia   . Sickle cell trait   . Depression     Past Surgical History  Procedure Laterality Date  . Cesarean section      all three pregnancies    Family History  Problem Relation Age of Onset  . Cancer Mother     colon cancer  . Hypertension Mother   . Heart disease Father   . Cancer Father     cancer  . Other Neg Hx     History  Substance Use Topics  . Smoking status: Current Some Day Smoker -- 0.20 packs/day    Types: Cigarettes  . Smokeless tobacco: Never Used     Comment: cessation info given  . Alcohol Use: No    OB History   Grav Para Term Preterm Abortions TAB SAB Ect Mult Living   3 3 0       3      Review of Systems  Constitutional: Positive for fever.  HENT: Positive for dental problem. Negative for sore throat and trouble swallowing.   Eyes: Negative.   Respiratory: Negative.  Negative for shortness of breath.   Cardiovascular: Negative.   Gastrointestinal: Negative.   Negative for nausea and vomiting.  Neurological: Negative.   Psychiatric/Behavioral: Negative.   All other systems reviewed and are negative.    Allergies  Bupropion hcl er (sr); Penicillins; and Pollen extract-tree extract  Home Medications   Current Outpatient Rx  Name  Route  Sig  Dispense  Refill  . albuterol (PROVENTIL HFA;VENTOLIN HFA) 108 (90 BASE) MCG/ACT inhaler   Inhalation   Inhale 2 puffs into the lungs every 2 (two) hours as needed for wheezing or shortness of breath (cough).   1 Inhaler   3   . clindamycin (CLEOCIN) 150 MG capsule   Oral   Take 1 capsule (150 mg total) by mouth every 6 (six) hours.   28 capsule   0   . FLUoxetine (PROZAC) 20 MG capsule   Oral   Take 20 mg by mouth daily.         . Fluticasone-Salmeterol (ADVAIR) 500-50 MCG/DOSE AEPB   Inhalation   Inhale 1 puff into the lungs 2 (two) times daily.         Marland Kitchen ibuprofen (ADVIL,MOTRIN) 200 MG tablet   Oral   Take 400 mg by mouth every 8 (eight) hours as needed. For pain           BP 113/67  Pulse  90  Resp 16  SpO2 98%  Physical Exam  Nursing note and vitals reviewed. Constitutional: She is oriented to person, place, and time. She appears well-developed and well-nourished.  HENT:  Head: Normocephalic and atraumatic.  Mouth/Throat:    Eyes: Conjunctivae and EOM are normal. Pupils are equal, round, and reactive to light.  Neck: Normal range of motion. Neck supple.  Cardiovascular: Normal rate.   Pulmonary/Chest: Effort normal.  Abdominal: Soft.  Musculoskeletal: Normal range of motion. She exhibits no edema and no tenderness.  Neurological: She is alert and oriented to person, place, and time. She has normal reflexes.  Skin: Skin is warm and dry.  Psychiatric: She has a normal mood and affect.    ED Course  Procedures (including critical care time)  Labs Reviewed - No data to display No results found.   1. Toothache       MDM  Broken molar with decay and pain.   Already on antibiotics and pain meds.  Percocet in ER.  Did not follow up.  No rx for narcotics.  Drug seeking behavior.  Specifically told to follow up this time.  Non toxic         Remi Haggard, NP 11/11/12 [phone]

## 2013-01-04 ENCOUNTER — Encounter (HOSPITAL_COMMUNITY): Payer: Self-pay | Admitting: Emergency Medicine

## 2013-01-04 ENCOUNTER — Emergency Department (HOSPITAL_COMMUNITY): Payer: Self-pay

## 2013-01-04 ENCOUNTER — Emergency Department (HOSPITAL_COMMUNITY)
Admission: EM | Admit: 2013-01-04 | Discharge: 2013-01-04 | Disposition: A | Discharge status: Home / Self Care | Payer: Self-pay | Attending: Emergency Medicine | Admitting: Emergency Medicine

## 2013-01-04 DIAGNOSIS — Z79899 Other long term (current) drug therapy: Secondary | ICD-10-CM | POA: Insufficient documentation

## 2013-01-04 DIAGNOSIS — F17200 Nicotine dependence, unspecified, uncomplicated: Secondary | ICD-10-CM | POA: Insufficient documentation

## 2013-01-04 DIAGNOSIS — M791 Myalgia, unspecified site: Secondary | ICD-10-CM

## 2013-01-04 DIAGNOSIS — M549 Dorsalgia, unspecified: Secondary | ICD-10-CM | POA: Insufficient documentation

## 2013-01-04 DIAGNOSIS — M542 Cervicalgia: Secondary | ICD-10-CM | POA: Insufficient documentation

## 2013-01-04 DIAGNOSIS — M79609 Pain in unspecified limb: Secondary | ICD-10-CM | POA: Insufficient documentation

## 2013-01-04 DIAGNOSIS — IMO0001 Reserved for inherently not codable concepts without codable children: Secondary | ICD-10-CM | POA: Insufficient documentation

## 2013-01-04 DIAGNOSIS — R059 Cough, unspecified: Secondary | ICD-10-CM | POA: Insufficient documentation

## 2013-01-04 DIAGNOSIS — F3289 Other specified depressive episodes: Secondary | ICD-10-CM | POA: Insufficient documentation

## 2013-01-04 DIAGNOSIS — J45909 Unspecified asthma, uncomplicated: Secondary | ICD-10-CM | POA: Insufficient documentation

## 2013-01-04 DIAGNOSIS — IMO0002 Reserved for concepts with insufficient information to code with codable children: Secondary | ICD-10-CM | POA: Insufficient documentation

## 2013-01-04 DIAGNOSIS — F329 Major depressive disorder, single episode, unspecified: Secondary | ICD-10-CM | POA: Insufficient documentation

## 2013-01-04 DIAGNOSIS — R05 Cough: Secondary | ICD-10-CM | POA: Insufficient documentation

## 2013-01-04 DIAGNOSIS — Z862 Personal history of diseases of the blood and blood-forming organs and certain disorders involving the immune mechanism: Secondary | ICD-10-CM | POA: Insufficient documentation

## 2013-01-04 DIAGNOSIS — Z8742 Personal history of other diseases of the female genital tract: Secondary | ICD-10-CM | POA: Insufficient documentation

## 2013-01-04 MED ORDER — PREDNISONE 20 MG PO TABS: 40.0000 mg | ORAL_TABLET | Freq: Every day | ORAL | Status: DC

## 2013-01-04 MED ORDER — NAPROXEN 500 MG PO TABS: 500.0000 mg | ORAL_TABLET | Freq: Two times a day (BID) | ORAL | Status: DC

## 2013-01-04 MED ORDER — KETOROLAC TROMETHAMINE 60 MG/2ML IM SOLN
60.0000 mg | Freq: Once | INTRAMUSCULAR | Status: AC
  Administered 2013-01-04: 60 mg via INTRAMUSCULAR
  Filled 2013-01-04: qty 2

## 2013-01-04 MED ORDER — METHOCARBAMOL 500 MG PO TABS: 500.0000 mg | ORAL_TABLET | Freq: Two times a day (BID) | ORAL | Status: DC | PRN

## 2013-01-04 NOTE — ED Notes (Signed)
PT reports back pain for over a month. Pt reports Lt arm started hurting 9 Days ago. The arm pain has increased and Pt is unable to raise Lt arm above her head unless she uses RT hand for assistance. Pt reports Pain increases with movement. Pt denies any injury to shoulder or back.  In triage Pt also reported a productive cough  With brown sputum.

## 2013-01-04 NOTE — ED Notes (Signed)
Pt was evaluated approx one month ago for back pain. Pt has been taking motrin and flexeril with out relief.

## 2013-01-04 NOTE — ED Notes (Signed)
Pt c/o generalized body aches and cough with brown sputum x 3 days; pt sts upper back pain into left arm worse with cough

## 2013-01-04 NOTE — ED Provider Notes (Signed)
History     CSN: [id number]  Arrival date & time 01/04/13  1044   First MD Initiated Contact with Patient 01/04/13 1157      Chief Complaint  Patient presents with  . Back Pain  . Cough  . Arm Pain    (Consider location/radiation/quality/duration/timing/severity/associated sxs/prior treatment) HPI Comments: Pt with history of pain in the left upper back, left neck and left arm. Her back pain is been present for approximately 2 months, she has been favoring this and not having very much exercise or exertion. Approximately 9 days ago she developed left upper extremity pain from the shoulder to the elbow which is worse with moving the arm, worse with turning her head to the left and not associated with weakness or numbness. She is right-handed, avoid physical activity because of the pain in her back her shoulder and her arm. She has been using a sling intermittently. Symptoms are persistent, worse with movement, relieved when she lays perfectly still.  Patient is a 41 y.o. female presenting with back pain, cough, and arm pain. The history is provided by the patient.  Back Pain Cough Arm Pain    Past Medical History  Diagnosis Date  . Allergy     pollen, pcn  . Asthma   . Fibroids   . History of ovarian cyst   . Sickle cell anemia     has the trait  . Menometrorrhagia   . Sickle cell trait   . Depression     Past Surgical History  Procedure Laterality Date  . Cesarean section      all three pregnancies    Family History  Problem Relation Age of Onset  . Cancer Mother     colon cancer  . Hypertension Mother   . Heart disease Father   . Cancer Father     cancer  . Other Neg Hx     History  Substance Use Topics  . Smoking status: Current Some Day Smoker -- 0.20 packs/day    Types: Cigarettes  . Smokeless tobacco: Never Used     Comment: cessation info given  . Alcohol Use: No    OB History   Grav Para Term Preterm Abortions TAB SAB Ect Mult Living   3 3 0        3      Review of Systems  Respiratory: Positive for cough.   Musculoskeletal: Positive for back pain.  All other systems reviewed and are negative.    Allergies  Bupropion hcl er (sr); Penicillins; and Pollen extract-tree extract  Home Medications   Current Outpatient Rx  Name  Route  Sig  Dispense  Refill  . albuterol (PROVENTIL HFA;VENTOLIN HFA) 108 (90 BASE) MCG/ACT inhaler   Inhalation   Inhale 2 puffs into the lungs every 2 (two) hours as needed for wheezing or shortness of breath (cough).   1 Inhaler   3   . clindamycin (CLEOCIN) 150 MG capsule   Oral   Take 1 capsule (150 mg total) by mouth every 6 (six) hours.   28 capsule   0   . FLUoxetine (PROZAC) 20 MG capsule   Oral   Take 20 mg by mouth daily.         . Fluticasone-Salmeterol (ADVAIR) 500-50 MCG/DOSE AEPB   Inhalation   Inhale 1 puff into the lungs 2 (two) times daily.         Marland Kitchen ibuprofen (ADVIL,MOTRIN) 200 MG tablet   Oral   Take  400 mg by mouth every 8 (eight) hours as needed. For pain         . methocarbamol (ROBAXIN) 500 MG tablet   Oral   Take 1 tablet (500 mg total) by mouth 2 (two) times daily as needed.   20 tablet   0   . naproxen (NAPROSYN) 500 MG tablet   Oral   Take 1 tablet (500 mg total) by mouth 2 (two) times daily with a meal.   30 tablet   0   . predniSONE (DELTASONE) 20 MG tablet   Oral   Take 2 tablets (40 mg total) by mouth daily.   10 tablet   0     BP 102/63  Pulse 84  Temp(Src) 98.9 F (37.2 C) (Oral)  Resp 18  SpO2 99%  LMP 01/02/2013  Physical Exam  Nursing note and vitals reviewed. Constitutional: She appears well-developed and well-nourished. No distress.  HENT:  Head: Normocephalic and atraumatic.  Mouth/Throat: Oropharynx is clear and moist. No oropharyngeal exudate.  Eyes: Conjunctivae and EOM are normal. Pupils are equal, round, and reactive to light. Right eye exhibits no discharge. Left eye exhibits no discharge. No scleral icterus.   Neck: Normal range of motion. Neck supple. No JVD present. No thyromegaly present.  Cardiovascular: Normal rate, regular rhythm, normal heart sounds and intact distal pulses.  Exam reveals no gallop and no friction rub.   No murmur heard. Pulmonary/Chest: Effort normal and breath sounds normal. No respiratory distress. She has no wheezes. She has no rales.  Abdominal: Soft. Bowel sounds are normal. She exhibits no distension and no mass. There is no tenderness.  Musculoskeletal: Normal range of motion. She exhibits no edema and no tenderness ( Reducible tenderness to palpation in the left rhomboid muscles, trapezius muscles, cervical muscles and the proximal left upper extremity muscles of the shoulder girdle and I sense and triceps.).  Lymphadenopathy:    She has no cervical adenopathy.  Neurological: She is alert. Coordination normal.  Normal strength and sensation of the left upper extremity  Skin: Skin is warm and dry. No rash noted. No erythema.  Psychiatric: She has a normal mood and affect. Her behavior is normal.    ED Course  Procedures (including critical care time)  Labs Reviewed - No data to display Dg Chest 2 View  01/04/2013  *RADIOLOGY REPORT*  Clinical Data: Shortness of breath, cough, back/chest pain  CHEST - 2 VIEW  Comparison: 07/09/2012  Findings: Lungs are clear. No pleural effusion or pneumothorax.  Cardiomediastinal silhouette is within normal limits.  Mild degenerative changes of the visualized thoracolumbar spine.  IMPRESSION: No evidence of acute cardiopulmonary disease.   Original Report Authenticated By: Charline Bills, M.D.      1. Pain in the muscles       MDM  There are no changes in the skin to suggest underlying cellulitis, this pain has been there for 9 days and there is not appear to be any swelling or asymmetry. There is no signs of IV drug use or other injuries, doubt that this is related to a necrotizing fasciitis or a myofascial syndrome. The  patient will need to have physical therapy which I have recommended, we'll treat her with medications for some hematocrit relief. She has a followup with her family Dr. in approximately one month.   Meds given in ED:  Medications  ketorolac (TORADOL) injection 60 mg (not administered)    New Prescriptions   METHOCARBAMOL (ROBAXIN) 500 MG TABLET  Take 1 tablet (500 mg total) by mouth 2 (two) times daily as needed.   NAPROXEN (NAPROSYN) 500 MG TABLET    Take 1 tablet (500 mg total) by mouth 2 (two) times daily with a meal.   PREDNISONE (DELTASONE) 20 MG TABLET    Take 2 tablets (40 mg total) by mouth daily.            Vida Roller, MD 01/04/13 1215

## 2013-01-25 ENCOUNTER — Encounter (HOSPITAL_COMMUNITY): Payer: Self-pay | Admitting: *Deleted

## 2013-01-25 ENCOUNTER — Emergency Department (HOSPITAL_COMMUNITY)
Admission: EM | Admit: 2013-01-25 | Discharge: 2013-01-25 | Disposition: A | Discharge status: Home / Self Care | Payer: Self-pay | Attending: Emergency Medicine | Admitting: Emergency Medicine

## 2013-01-25 DIAGNOSIS — IMO0002 Reserved for concepts with insufficient information to code with codable children: Secondary | ICD-10-CM | POA: Insufficient documentation

## 2013-01-25 DIAGNOSIS — Z862 Personal history of diseases of the blood and blood-forming organs and certain disorders involving the immune mechanism: Secondary | ICD-10-CM | POA: Insufficient documentation

## 2013-01-25 DIAGNOSIS — Z880 Allergy status to penicillin: Secondary | ICD-10-CM | POA: Insufficient documentation

## 2013-01-25 DIAGNOSIS — R0789 Other chest pain: Secondary | ICD-10-CM | POA: Insufficient documentation

## 2013-01-25 DIAGNOSIS — Z87891 Personal history of nicotine dependence: Secondary | ICD-10-CM | POA: Insufficient documentation

## 2013-01-25 DIAGNOSIS — Z8742 Personal history of other diseases of the female genital tract: Secondary | ICD-10-CM | POA: Insufficient documentation

## 2013-01-25 DIAGNOSIS — Z79899 Other long term (current) drug therapy: Secondary | ICD-10-CM | POA: Insufficient documentation

## 2013-01-25 DIAGNOSIS — Z760 Encounter for issue of repeat prescription: Secondary | ICD-10-CM | POA: Insufficient documentation

## 2013-01-25 DIAGNOSIS — F3289 Other specified depressive episodes: Secondary | ICD-10-CM | POA: Insufficient documentation

## 2013-01-25 DIAGNOSIS — J45901 Unspecified asthma with (acute) exacerbation: Secondary | ICD-10-CM | POA: Insufficient documentation

## 2013-01-25 DIAGNOSIS — F329 Major depressive disorder, single episode, unspecified: Secondary | ICD-10-CM | POA: Insufficient documentation

## 2013-01-25 DIAGNOSIS — R6883 Chills (without fever): Secondary | ICD-10-CM | POA: Insufficient documentation

## 2013-01-25 MED ORDER — ALBUTEROL SULFATE (5 MG/ML) 0.5% IN NEBU
5.0000 mg | INHALATION_SOLUTION | Freq: Once | RESPIRATORY_TRACT | Status: AC
  Administered 2013-01-25: 5 mg via RESPIRATORY_TRACT
  Filled 2013-01-25: qty 1

## 2013-01-25 MED ORDER — MOMETASONE FURO-FORMOTEROL FUM 200-5 MCG/ACT IN AERO
2.0000 | INHALATION_SPRAY | Freq: Two times a day (BID) | RESPIRATORY_TRACT | Status: DC
  Administered 2013-01-25: 2 via RESPIRATORY_TRACT
  Filled 2013-01-25: qty 8.8

## 2013-01-25 MED ORDER — ALBUTEROL SULFATE HFA 108 (90 BASE) MCG/ACT IN AERS: 2.0000 | INHALATION_SPRAY | RESPIRATORY_TRACT | Status: DC | PRN

## 2013-01-25 MED ORDER — PREDNISONE 20 MG PO TABS: 40.0000 mg | ORAL_TABLET | Freq: Every day | ORAL | Status: DC

## 2013-01-25 MED ORDER — PREDNISONE 20 MG PO TABS
60.0000 mg | ORAL_TABLET | Freq: Once | ORAL | Status: AC
  Administered 2013-01-25: 60 mg via ORAL
  Filled 2013-01-25: qty 3

## 2013-01-25 MED ORDER — MOMETASONE FURO-FORMOTEROL FUM 200-5 MCG/ACT IN AERO: 2.0000 | INHALATION_SPRAY | Freq: Two times a day (BID) | RESPIRATORY_TRACT | Status: DC

## 2013-01-25 MED ORDER — ALBUTEROL SULFATE HFA 108 (90 BASE) MCG/ACT IN AERS
2.0000 | INHALATION_SPRAY | RESPIRATORY_TRACT | Status: DC | PRN
  Filled 2013-01-25 (×2): qty 6.7

## 2013-01-25 NOTE — ED Notes (Signed)
Pt is here with sob that is related to her asthma and she cannot get the advair prescription filled because of money.  Pt ran out of her albuterol inhaler yesterday adn it was helping

## 2013-01-25 NOTE — ED Provider Notes (Signed)
History    This chart was scribed for a non-physician practitioner, Dierdre Forth, PA-C, working with Loren Racer, MD by Frederik Pear, ED Scribe. This patient was seen in room TR10C/TR10C and the patient's care was started at 1737.   CSN: [id number]  Arrival date & time 01/25/13  1725   First MD Initiated Contact with Patient 01/25/13 1737      Chief Complaint  Patient presents with  . Shortness of Breath  . Asthma    (Consider location/radiation/quality/duration/timing/severity/associated sxs/prior treatment) Patient is a 42 y.o. female presenting with shortness of breath. The history is provided by the patient and medical records. No language interpreter was used.  Shortness of Breath Onset quality:  Gradual Duration:  1 day Timing:  Constant Progression:  Worsening Associated symptoms: wheezing   Associated symptoms: no abdominal pain, no chest pain, no cough, no diaphoresis, no fever, no headaches, no rash and no vomiting     HPI Comments: [name] is a 43 y.o. female with a h/o of asthma who presents to the Emergency Department complaining of gradually worsening, constant, worse than baseline SOB with intermittent wheezing and mild chills that began today after she ran out of her albuterol inhaler. She denies fever, nausea, emesis, or cough. She states that she also ran out of her Advair inhaler on 04/29 and she has a prescription for a refill from her PCP, but is unable to afford the $300 cost to refill the medication. She states that she has been using her albuterol inhaler every morning and afternoon since she ran out of her Advair, but states that she exhausted the medication yesterday. She reports that she filed for disability and Medicaid 4 days ago, but neither has been approved yet. She states that the health department is unable to see her until she has a PCP, but her PCP will not see her again until she is taking all of her medications.  In ED, she  reports that her SOB has worsened to the point that an albuterol inhaler will not provide relief. She states that she has used a nebulizer during previous ED visits, but denies having one at home. She denies any sick contacts. She has a h/o of Sickle cell trait, and depression, but no h/o of DM.  PCP is Dr. Quitman Livings.  Past Medical History  Diagnosis Date  . Allergy     pollen, pcn  . Asthma   . Fibroids   . History of ovarian cyst   . Sickle cell anemia     has the trait  . Menometrorrhagia   . Sickle cell trait   . Depression     Past Surgical History  Procedure Laterality Date  . Cesarean section      all three pregnancies    Family History  Problem Relation Age of Onset  . Cancer Mother     colon cancer  . Hypertension Mother   . Heart disease Father   . Cancer Father     cancer  . Other Neg Hx     History  Substance Use Topics  . Smoking status: Former Smoker -- 0.20 packs/day    Types: Cigarettes  . Smokeless tobacco: Never Used     Comment: cessation info given  . Alcohol Use: No    OB History   Grav Para Term Preterm Abortions TAB SAB Ect Mult Living   3 3 0       3      Review of  Systems  Constitutional: Positive for chills. Negative for fever, diaphoresis, appetite change, fatigue and unexpected weight change.  HENT: Negative for mouth sores and neck stiffness.   Eyes: Negative for visual disturbance.  Respiratory: Positive for chest tightness, shortness of breath and wheezing. Negative for cough.   Cardiovascular: Negative for chest pain.  Gastrointestinal: Negative for nausea, vomiting, abdominal pain, diarrhea and constipation.  Endocrine: Negative for polydipsia, polyphagia and polyuria.  Genitourinary: Negative for dysuria, urgency, frequency and hematuria.  Musculoskeletal: Negative for back pain.  Skin: Negative for rash.  Allergic/Immunologic: Negative for immunocompromised state.  Neurological: Negative for syncope, light-headedness  and headaches.  Hematological: Does not bruise/bleed easily.  Psychiatric/Behavioral: Negative for sleep disturbance. The patient is not nervous/anxious.     Allergies  Bupropion hcl er (sr); Penicillins; and Pollen extract-tree extract  Home Medications   Current Outpatient Rx  Name  Route  Sig  Dispense  Refill  . albuterol (PROVENTIL HFA;VENTOLIN HFA) 108 (90 BASE) MCG/ACT inhaler   Inhalation   Inhale 2 puffs into the lungs every 2 (two) hours as needed for wheezing or shortness of breath (cough).   1 Inhaler   3   . FLUoxetine (PROZAC) 20 MG tablet   Oral   Take 20 mg by mouth daily.         . Fluticasone-Salmeterol (ADVAIR) 500-50 MCG/DOSE AEPB   Inhalation   Inhale 1 puff into the lungs every 12 (twelve) hours.         . methocarbamol (ROBAXIN) 500 MG tablet   Oral   Take 500 mg by mouth 3 (three) times daily as needed (muscle spasms).         . traMADol (ULTRAM) 50 MG tablet   Oral   Take 50 mg by mouth every 6 (six) hours as needed for pain.         Marland Kitchen albuterol (PROVENTIL HFA;VENTOLIN HFA) 108 (90 BASE) MCG/ACT inhaler   Inhalation   Inhale 2 puffs into the lungs every 4 (four) hours as needed for wheezing or shortness of breath.   1 Inhaler   3   . mometasone-formoterol (DULERA) 200-5 MCG/ACT AERO   Inhalation   Inhale 2 puffs into the lungs 2 (two) times daily.   13 g   1   . predniSONE (DELTASONE) 20 MG tablet   Oral   Take 2 tablets (40 mg total) by mouth daily.   10 tablet   0     BP 116/65  Pulse 84  Temp(Src) 98.6 F (37 C) (Oral)  Resp 18  SpO2 100%  LMP 01/02/2013  Physical Exam  Nursing note and vitals reviewed. Constitutional: She is oriented to person, place, and time. She appears well-developed and well-nourished. No distress.  HENT:  Head: Normocephalic and atraumatic.  Right Ear: Tympanic membrane, external ear and ear canal normal.  Left Ear: Tympanic membrane, external ear and ear canal normal.  Nose: Nose normal.  No mucosal edema or rhinorrhea.  Mouth/Throat: Uvula is midline, oropharynx is clear and moist and mucous membranes are normal. Mucous membranes are not dry. No oropharyngeal exudate, posterior oropharyngeal edema, posterior oropharyngeal erythema or tonsillar abscesses.  Eyes: Conjunctivae are normal. Pupils are equal, round, and reactive to light. No scleral icterus.  Neck: Normal range of motion. Neck supple.  Cardiovascular: Normal rate, regular rhythm, normal heart sounds and intact distal pulses.   No murmur heard. Pulmonary/Chest: Effort normal. No respiratory distress. She has decreased breath sounds (throughout). She has no wheezes. She has  no rhonchi. She has no rales. She exhibits no tenderness and no bony tenderness.  Abdominal: Soft. Bowel sounds are normal. She exhibits no mass. There is no tenderness. There is no rebound and no guarding.  Musculoskeletal: Normal range of motion. She exhibits no edema and no tenderness.  Lymphadenopathy:    She has no cervical adenopathy.  Neurological: She is alert and oriented to person, place, and time. She exhibits normal muscle tone. Coordination normal.  Speech is clear and goal oriented Moves extremities without ataxia  Skin: Skin is warm and dry. No rash noted. She is not diaphoretic. No erythema.  Psychiatric: She has a normal mood and affect.    ED Course  Procedures (including critical care time)  DIAGNOSTIC STUDIES: Oxygen Saturation is 100% on room air, normal by my interpretation.    COORDINATION OF CARE:  18:08- Discussed planned course of treatment with the patient, including a breathing treatment, Dulera, and prednisone, who is agreeable at this time.  18:10- Medication Orders- albuterol (proventil HFA; ventolin HFA) 108 (90 base) mcg/act inhaler 2 puff- every 4 hours.  18:15- Medication Orders- prednisone (deltasone) tablet 60 mg- once, albuterol (proventil) (5mg /ml) 0.5% nebulizer solution 5 mg- once.  20:00-  Medication Orders- mometasone-formoterol (dulera) 200-5 mcg/act inhaler 2 puff- 2 times daily.   18:59- Recheck- Upon reexamination, she is breathing at baseline. No decreased breath sounds. Increased tidal volume. Spoke with Amy at case management who will contact patient tomorrow in regards to getting her Advair filled.   Labs Reviewed - No data to display No results found.   1. Asthma exacerbation   2. Encounter for medication refill       MDM  KEALY LEWTER presents with asthma exacerbation and inability to fill her advair.  Patient ambulated in ED with O2 saturations maintained >90, no current signs of respiratory distress. Lung exam improved after nebulizer treatment. Prednisone given in the ED and pt will be dc with 5 day burst. Pt states they are breathing at baseline. Pt has been instructed to continue using prescribed medications and to speak with PCP about today's exacerbation. Will also be contacted by case management for help with medications.  I have also discussed reasons to return immediately to the ER.  Patient expresses understanding and agrees with plan.   I personally performed the services described in this documentation, which was scribed in my presence. The recorded information has been reviewed and is accurate.   Dierdre Forth, PA-C 01/25/13 1908

## 2013-01-25 NOTE — ED Notes (Signed)
Pt discharged.Vital signs stable and GCS 15 

## 2013-01-26 NOTE — Progress Notes (Signed)
Pt seen in ED 01/25/13. CM asked to assist with medications. M[employer] Program voucher sent to [employer] on [address] on 01/26/13.

## 2013-02-08 NOTE — ED Provider Notes (Signed)
Medical screening examination/treatment/procedure(s) were performed by non-physician practitioner and as supervising physician I was immediately available for consultation/collaboration.   Loren Racer, MD 02/08/13 [phone]

## 2013-02-14 ENCOUNTER — Emergency Department (HOSPITAL_COMMUNITY)
Admission: EM | Admit: 2013-02-14 | Discharge: 2013-02-14 | Disposition: A | Discharge status: Home / Self Care | Payer: Self-pay | Attending: Emergency Medicine | Admitting: Emergency Medicine

## 2013-02-14 ENCOUNTER — Encounter (HOSPITAL_COMMUNITY): Payer: Self-pay | Admitting: *Deleted

## 2013-02-14 DIAGNOSIS — L299 Pruritus, unspecified: Secondary | ICD-10-CM | POA: Insufficient documentation

## 2013-02-14 DIAGNOSIS — T7840XA Allergy, unspecified, initial encounter: Secondary | ICD-10-CM

## 2013-02-14 DIAGNOSIS — J45909 Unspecified asthma, uncomplicated: Secondary | ICD-10-CM | POA: Insufficient documentation

## 2013-02-14 DIAGNOSIS — Z87891 Personal history of nicotine dependence: Secondary | ICD-10-CM | POA: Insufficient documentation

## 2013-02-14 DIAGNOSIS — F3289 Other specified depressive episodes: Secondary | ICD-10-CM | POA: Insufficient documentation

## 2013-02-14 DIAGNOSIS — M542 Cervicalgia: Secondary | ICD-10-CM | POA: Insufficient documentation

## 2013-02-14 DIAGNOSIS — T4995XA Adverse effect of unspecified topical agent, initial encounter: Secondary | ICD-10-CM | POA: Insufficient documentation

## 2013-02-14 DIAGNOSIS — Z862 Personal history of diseases of the blood and blood-forming organs and certain disorders involving the immune mechanism: Secondary | ICD-10-CM | POA: Insufficient documentation

## 2013-02-14 DIAGNOSIS — Z8742 Personal history of other diseases of the female genital tract: Secondary | ICD-10-CM | POA: Insufficient documentation

## 2013-02-14 DIAGNOSIS — F329 Major depressive disorder, single episode, unspecified: Secondary | ICD-10-CM | POA: Insufficient documentation

## 2013-02-14 DIAGNOSIS — R21 Rash and other nonspecific skin eruption: Secondary | ICD-10-CM | POA: Insufficient documentation

## 2013-02-14 DIAGNOSIS — Z880 Allergy status to penicillin: Secondary | ICD-10-CM | POA: Insufficient documentation

## 2013-02-14 DIAGNOSIS — Z79899 Other long term (current) drug therapy: Secondary | ICD-10-CM | POA: Insufficient documentation

## 2013-02-14 DIAGNOSIS — IMO0002 Reserved for concepts with insufficient information to code with codable children: Secondary | ICD-10-CM | POA: Insufficient documentation

## 2013-02-14 MED ORDER — PREDNISONE 20 MG PO TABS
60.0000 mg | ORAL_TABLET | Freq: Once | ORAL | Status: AC
  Administered 2013-02-14: 60 mg via ORAL
  Filled 2013-02-14: qty 3

## 2013-02-14 MED ORDER — FAMOTIDINE 20 MG PO TABS
20.0000 mg | ORAL_TABLET | Freq: Once | ORAL | Status: AC
  Administered 2013-02-14: 20 mg via ORAL
  Filled 2013-02-14: qty 1

## 2013-02-14 MED ORDER — FAMOTIDINE 20 MG PO TABS: 20.0000 mg | ORAL_TABLET | Freq: Two times a day (BID) | ORAL | Status: DC

## 2013-02-14 MED ORDER — DIPHENHYDRAMINE HCL 25 MG PO CAPS
50.0000 mg | ORAL_CAPSULE | Freq: Once | ORAL | Status: AC
  Administered 2013-02-14: 50 mg via ORAL
  Filled 2013-02-14: qty 2

## 2013-02-14 MED ORDER — DIPHENHYDRAMINE HCL 25 MG PO TABS: 25.0000 mg | ORAL_TABLET | Freq: Four times a day (QID) | ORAL | Status: DC | PRN

## 2013-02-14 MED ORDER — PREDNISONE 20 MG PO TABS: ORAL_TABLET | ORAL | Status: DC

## 2013-02-14 NOTE — ED Provider Notes (Signed)
History     CSN: [id number]  Arrival date & time 02/14/13  1037   First MD Initiated Contact with Patient 02/14/13 1045      Chief Complaint  Patient presents with  . Rash  . bug bite     (Consider location/radiation/quality/duration/timing/severity/associated sxs/prior treatment) HPI Comments: 42 y.o. Female with PMHx of asthma, allergies to roaches, sickle cell, presents today complaining of acute onset pruitic rash that developed after she felt a bug (described as a red dot) bite her on the back of the neck. She says the itching developed almost immediately. She tried hydrocortisone cream which did not help. Itching is localized. Pt states she felt another bite around 4am, but did not see the bug this time. Continued to itch into today. Developed into hives. Patient denies SOB, dyspnea, feeling of throat closing, fever, nausea, vomiting.  Patient is a 42 y.o. female presenting with rash. The history is provided by the patient.  Rash Pain location: posterior neck. Pain quality comment:  Itching Pain radiates to:  Does not radiate Pain severity:  Moderate Onset quality:  Sudden Duration: last night. Timing:  Constant Progression:  Worsening Chronicity:  New Context comment:  Felt herself get bit by a bug last night about 10pm then again at 4am Relieved by:  Nothing Worsened by:  Nothing tried Ineffective treatments: cortisone cream. Associated symptoms: no chest pain, no chills, no cough, no dysuria, no fever, no nausea and no vomiting   Risk factors: not elderly, has not had multiple surgeries, no NSAID use and no recent hospitalization     Past Medical History  Diagnosis Date  . Allergy     pollen, pcn  . Asthma   . Fibroids   . History of ovarian cyst   . Sickle cell anemia     has the trait  . Menometrorrhagia   . Sickle cell trait   . Depression     Past Surgical History  Procedure Laterality Date  . Cesarean section      all three pregnancies    Family  History  Problem Relation Age of Onset  . Cancer Mother     colon cancer  . Hypertension Mother   . Heart disease Father   . Cancer Father     cancer  . Other Neg Hx     History  Substance Use Topics  . Smoking status: Former Smoker -- 0.20 packs/day    Types: Cigarettes  . Smokeless tobacco: Never Used     Comment: cessation info given  . Alcohol Use: No    OB History   Grav Para Term Preterm Abortions TAB SAB Ect Mult Living   3 3 0       3      Review of Systems  Constitutional: Negative for fever and chills.  HENT: Negative for neck pain and neck stiffness.   Respiratory: Negative for cough.   Cardiovascular: Negative for chest pain.  Gastrointestinal: Negative for nausea and vomiting.  Genitourinary: Negative for dysuria and flank pain.  Musculoskeletal: Negative for myalgias, back pain, arthralgias and gait problem.  Skin: Positive for rash.       Back of neck    Allergies  Bupropion hcl er (sr); Penicillins; and Pollen extract-tree extract  Home Medications   Current Outpatient Rx  Name  Route  Sig  Dispense  Refill  . albuterol (PROVENTIL HFA;VENTOLIN HFA) 108 (90 BASE) MCG/ACT inhaler   Inhalation   Inhale 2 puffs into the lungs every  4 (four) hours as needed for wheezing or shortness of breath.         Marland Kitchen FLUoxetine (PROZAC) 20 MG tablet   Oral   Take 20 mg by mouth daily.         . Fluticasone-Salmeterol (ADVAIR) 500-50 MCG/DOSE AEPB   Inhalation   Inhale 1 puff into the lungs every 12 (twelve) hours.         . hydrocortisone cream 1 %   Topical   Apply 1 application topically once.         . methocarbamol (ROBAXIN) 500 MG tablet   Oral   Take 500 mg by mouth 3 (three) times daily as needed (muscle spasms).         . mometasone-formoterol (DULERA) 200-5 MCG/ACT AERO   Inhalation   Inhale 2 puffs into the lungs 2 (two) times daily.         . predniSONE (DELTASONE) 20 MG tablet   Oral   Take 40 mg by mouth daily.         .  traMADol (ULTRAM) 50 MG tablet   Oral   Take 50 mg by mouth every 6 (six) hours as needed for pain.         . diphenhydrAMINE (BENADRYL) 25 MG tablet   Oral   Take 1 tablet (25 mg total) by mouth every 6 (six) hours as needed for itching (Rash).   30 tablet   0   . famotidine (PEPCID) 20 MG tablet   Oral   Take 1 tablet (20 mg total) by mouth 2 (two) times daily.   30 tablet   0   . predniSONE (DELTASONE) 20 MG tablet      Take 3 pills (60 mg total) once a day by mouth for 5 days   15 tablet   0     BP 114/71  Pulse 88  Temp(Src) 98.7 F (37.1 C) (Oral)  Resp 18  SpO2 100%  Physical Exam  Nursing note and vitals reviewed. Constitutional: She is oriented to person, place, and time. She appears well-developed and well-nourished. No distress.  HENT:  Head: Normocephalic and atraumatic.  Eyes: Conjunctivae and EOM are normal.  Neck: Normal range of motion. Neck supple.  No meningeal signs  Cardiovascular: Normal rate, regular rhythm and normal heart sounds.  Exam reveals no gallop and no friction rub.   No murmur heard. Pulmonary/Chest: Effort normal and breath sounds normal. No respiratory distress. She has no wheezes. She has no rales. She exhibits no tenderness.  Abdominal: Soft. Bowel sounds are normal. She exhibits no distension. There is no tenderness. There is no rebound and no guarding.  Musculoskeletal: Normal range of motion. She exhibits no edema and no tenderness.  FROM to upper and lower extremities  Neurological: She is alert and oriented to person, place, and time. No cranial nerve deficit.  Speech is clear and goal oriented, follows commands Sensation normal to light touch  Moves extremities without ataxia, coordination intact Normal gait and balance Normal strength in upper and lower extremities bilaterally including dorsiflexion and plantar flexion, strong and equal grip strength   Skin: Skin is warm and dry. Rash noted. She is not diaphoretic.  There is erythema.  Wheals noted to posterior neck, spread approx 5 cm on either side of cervical midline  Psychiatric: She has a normal mood and affect.    ED Course  Procedures (including critical care time) Medications  predniSONE (DELTASONE) tablet 60 mg (60 mg Oral  Given 02/14/13 1202)  diphenhydrAMINE (BENADRYL) capsule 50 mg (50 mg Oral Given 02/14/13 1203)  famotidine (PEPCID) tablet 20 mg (20 mg Oral Given 02/14/13 1203)     Discharge Medication List as of 02/14/2013 12:34 PM    START taking these medications   Details  diphenhydrAMINE (BENADRYL) 25 MG tablet Take 1 tablet (25 mg total) by mouth every 6 (six) hours as needed for itching (Rash)., Starting 02/14/2013, Until Discontinued, Print    famotidine (PEPCID) 20 MG tablet Take 1 tablet (20 mg total) by mouth 2 (two) times daily., Starting 02/14/2013, Until Discontinued, Print        1. Acute allergic reaction, initial encounter       MDM  Patient presents is hemodynamically stable, in no respiratory distress, and denies the feeling of throat closing. Pt tx with prednisone, benadryl, and pepcid. On re-eval, pt is feeling better and ready to go home. Pt has been advised to take OTC benadryl/pepcid and prescribed 5 day burst of prednisone. Directed to return to the ED if they have a mod-severe allergic rxn (s/s including throat closing, difficulty breathing, swelling of lips face or tongue). Pt is to follow up with their PCP. Pt is agreeable with plan & verbalizes understanding.     Glade Nurse, PA-C 02/14/13 [phone]

## 2013-02-14 NOTE — ED Notes (Signed)
Pt states she has been bite on the back of the neck by a red bug with legs and when you pop it, it is full of blood.  Pt has rash to posterior neck.  Pt is talking in full sentences and pt states it feels like her throat may be closing.  No wheezing and sats wnl.

## 2013-02-14 NOTE — ED Provider Notes (Signed)
Medical screening examination/treatment/procedure(s) were performed by non-physician practitioner and as supervising physician I was immediately available for consultation/collaboration.   Glynn Octave, MD 02/14/13 1806

## 2013-02-16 NOTE — ED Provider Notes (Signed)
Medical screening examination/treatment/procedure(s) were performed by non-physician practitioner and as supervising physician I was immediately available for consultation/collaboration.   Reneshia Zuccaro, MD 02/16/13 2330 

## 2013-02-18 ENCOUNTER — Ambulatory Visit (HOSPITAL_COMMUNITY)
Admission: RE | Admit: 2013-02-18 | Discharge: 2013-02-18 | Disposition: A | Discharge status: Home / Self Care | Payer: Self-pay | Attending: Psychiatry | Admitting: Psychiatry

## 2013-02-18 DIAGNOSIS — F329 Major depressive disorder, single episode, unspecified: Secondary | ICD-10-CM | POA: Insufficient documentation

## 2013-02-18 DIAGNOSIS — F3289 Other specified depressive episodes: Secondary | ICD-10-CM | POA: Insufficient documentation
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
## 2013-02-25 ENCOUNTER — Encounter (HOSPITAL_COMMUNITY): Payer: Self-pay | Admitting: *Deleted

## 2013-02-25 ENCOUNTER — Inpatient Hospital Stay (HOSPITAL_COMMUNITY)
Admission: EM | Admit: 2013-02-25 | Discharge: 2013-02-27 | DRG: 885 | Disposition: A | Discharge status: Home / Self Care | Payer: Federal, State, Local not specified - Other | Attending: Psychiatry | Admitting: Psychiatry

## 2013-02-25 DIAGNOSIS — F332 Major depressive disorder, recurrent severe without psychotic features: Principal | ICD-10-CM | POA: Diagnosis present

## 2013-02-25 DIAGNOSIS — Z79899 Other long term (current) drug therapy: Secondary | ICD-10-CM

## 2013-02-25 DIAGNOSIS — R45851 Suicidal ideations: Secondary | ICD-10-CM

## 2013-02-25 DIAGNOSIS — J45909 Unspecified asthma, uncomplicated: Secondary | ICD-10-CM | POA: Diagnosis present

## 2013-02-25 HISTORY — DX: Other specified health status: Z78.9

## 2013-02-25 LAB — LIPID PANEL
Cholesterol: 143 mg/dL (ref 0–200)
HDL: 54 mg/dL (ref >39)
LDL Cholesterol: 79 mg/dL (ref 0–99)
Total CHOL/HDL Ratio: 2.6 RATIO
Triglycerides: 48 mg/dL (ref <150)
VLDL: 10 mg/dL (ref 0–40)

## 2013-02-25 LAB — CBC
HCT: 35.1 % — ABNORMAL LOW (ref 36.0–46.0)
Hemoglobin: 12.3 g/dL (ref 12.0–15.0)
MCHC: 35 g/dL (ref 30.0–36.0)
RDW: 13.3 % (ref 11.5–15.5)
WBC: 7.5 10*3/uL (ref 4.0–10.5)

## 2013-02-25 LAB — URINALYSIS, ROUTINE W REFLEX MICROSCOPIC
Bilirubin Urine: NEGATIVE
Glucose, UA: NEGATIVE mg/dL
Ketones, ur: NEGATIVE mg/dL
Protein, ur: NEGATIVE mg/dL
pH: 5.5 (ref 5.0–8.0)

## 2013-02-25 LAB — COMPREHENSIVE METABOLIC PANEL
ALT: 7 U/L (ref 0–35)
Albumin: 3.6 g/dL (ref 3.5–5.2)
Alkaline Phosphatase: 56 U/L (ref 39–117)
BUN: 7 mg/dL (ref 6–23)
Chloride: 105 mEq/L (ref 96–112)
Potassium: 3.5 mEq/L (ref 3.5–5.1)
Sodium: 137 mEq/L (ref 135–145)
Total Bilirubin: 0.5 mg/dL (ref 0.3–1.2)
Total Protein: 6.7 g/dL (ref 6.0–8.3)

## 2013-02-25 LAB — BILIRUBIN, DIRECT: Bilirubin, Direct: <0.1 mg/dL (ref 0.0–0.3)

## 2013-02-25 LAB — RAPID URINE DRUG SCREEN, HOSP PERFORMED
Amphetamines: NOT DETECTED
Barbiturates: NOT DETECTED
Benzodiazepines: NOT DETECTED
Opiates: NOT DETECTED
Tetrahydrocannabinol: NOT DETECTED

## 2013-02-25 LAB — URINE MICROSCOPIC-ADD ON

## 2013-02-25 LAB — HEMOGLOBIN A1C: Hgb A1c MFr Bld: 4.6 % (ref <5.7)

## 2013-02-25 MED ORDER — ACETAMINOPHEN 325 MG PO TABS: 650.0000 mg | ORAL_TABLET | Freq: Four times a day (QID) | ORAL | Status: DC | PRN

## 2013-02-25 MED ORDER — TRAZODONE HCL 50 MG PO TABS
50.0000 mg | ORAL_TABLET | Freq: Every evening | ORAL | Status: DC | PRN
  Administered 2013-02-25 – 2013-02-26 (×2): 50 mg via ORAL
  Filled 2013-02-25: qty 1
  Filled 2013-02-25: qty 14
  Filled 2013-02-25: qty 1

## 2013-02-25 MED ORDER — METHOCARBAMOL 500 MG PO TABS: 500.0000 mg | ORAL_TABLET | Freq: Three times a day (TID) | ORAL | Status: DC | PRN

## 2013-02-25 MED ORDER — MOMETASONE FURO-FORMOTEROL FUM 200-5 MCG/ACT IN AERO
2.0000 | INHALATION_SPRAY | Freq: Two times a day (BID) | RESPIRATORY_TRACT | Status: DC
  Administered 2013-02-25 – 2013-02-27 (×5): 2 via RESPIRATORY_TRACT
  Filled 2013-02-25: qty 8.8

## 2013-02-25 MED ORDER — MAGNESIUM HYDROXIDE 400 MG/5ML PO SUSP: 30.0000 mL | Freq: Every day | ORAL | Status: DC | PRN

## 2013-02-25 MED ORDER — MOMETASONE FURO-FORMOTEROL FUM 200-5 MCG/ACT IN AERO: 2.0000 | INHALATION_SPRAY | Freq: Two times a day (BID) | RESPIRATORY_TRACT | Status: DC

## 2013-02-25 MED ORDER — TRAMADOL HCL 50 MG PO TABS
50.0000 mg | ORAL_TABLET | Freq: Four times a day (QID) | ORAL | Status: DC | PRN
  Administered 2013-02-25 – 2013-02-26 (×2): 50 mg via ORAL
  Filled 2013-02-25 (×2): qty 1

## 2013-02-25 MED ORDER — PNEUMOCOCCAL VAC POLYVALENT 25 MCG/0.5ML IJ INJ
0.5000 mL | INJECTION | INTRAMUSCULAR | Status: AC
  Administered 2013-02-26: 0.5 mL via INTRAMUSCULAR

## 2013-02-25 MED ORDER — FLUOXETINE HCL 20 MG PO CAPS
20.0000 mg | ORAL_CAPSULE | Freq: Every day | ORAL | Status: DC
  Administered 2013-02-25 – 2013-02-27 (×3): 20 mg via ORAL
  Filled 2013-02-25: qty 1
  Filled 2013-02-25: qty 14
  Filled 2013-02-25 (×3): qty 1

## 2013-02-25 MED ORDER — NICOTINE 14 MG/24HR TD PT24
14.0000 mg | MEDICATED_PATCH | Freq: Every day | TRANSDERMAL | Status: DC
  Administered 2013-02-25 – 2013-02-27 (×3): 14 mg via TRANSDERMAL
  Filled 2013-02-25 (×6): qty 1

## 2013-02-25 MED ORDER — ALUM & MAG HYDROXIDE-SIMETH 200-200-20 MG/5ML PO SUSP: 30.0000 mL | ORAL | Status: DC | PRN

## 2013-02-25 MED ORDER — FAMOTIDINE 20 MG PO TABS
20.0000 mg | ORAL_TABLET | Freq: Two times a day (BID) | ORAL | Status: DC
  Administered 2013-02-25 – 2013-02-27 (×5): 20 mg via ORAL
  Filled 2013-02-25 (×9): qty 1

## 2013-02-25 MED ORDER — ALBUTEROL SULFATE HFA 108 (90 BASE) MCG/ACT IN AERS: 2.0000 | INHALATION_SPRAY | RESPIRATORY_TRACT | Status: DC | PRN

## 2013-02-25 NOTE — Progress Notes (Signed)
Recreation Therapy Notes  Date: 06.11.2014  Time: 3:00pm  Location: [address] Day Room  Group Topic/Focus: Problem Solving, Communication, Team Work  Participation Level:  Active  Participation Quality:  Appropriate  Affect:  Euthymic  Cognitive:  Appropriate  Additional Comments: Activity: Theme park manager ; Explanation: Patients were given the following objects: 10 paper clips, 5 rubber bands, 1 uninflated balloon, 1 roll of masking tape, 2 index cards, 3 sheets of paper, 2 paper cups, 3 ping pong balls. LRT marked two lines on the floor using masking tape 10 feet from each other. Patients were instructed to build a contraption out of the supplies provided to launch the ping pong balls across the line on the opposite side of the room.   Patient actively participated in group activity. Patient offered suggestions for building contraption. Patient offered encouragement to peers as needed. Patient contributed to wrap up discussion about the skills needed to complete the task requested and how they can be used post d/c. Patient related skills needed during group to not giving up when you encounter a problem.    [name] [name], LRT/CTRS  Esmee Fallaw L 02/25/2013 4:30 PM

## 2013-02-25 NOTE — Progress Notes (Signed)
D:Pt rates her depression as an 8 on 1-10 scale with 10 being the most depressed and reports passive si thoughts. Pt is quiet with minimal interaction. Pt has stressor of getting her medications and paying for them. A:Supported pt to discuss feelings. Offered encouragement and 15 minute checks. R:Pt contracts for safety. Safety maintained on the unit.
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
## 2013-02-25 NOTE — [facility]HH Suicide Risk Assessment (Signed)
Suicide Risk Assessment  Admission Assessment     Nursing information obtained from:    Demographic factors:    Current Mental Status:    Loss Factors:    Historical Factors:    Risk Reduction Factors:     CLINICAL FACTORS:   Severe Anxiety and/or Agitation Depression:   Aggression Anhedonia Hopelessness Insomnia Recent sense of peace/wellbeing Severe Unstable or Poor Therapeutic Relationship Previous Psychiatric Diagnoses and Treatments Medical Diagnoses and Treatments/Surgeries  COGNITIVE FEATURES THAT CONTRIBUTE TO RISK:  Closed-mindedness Loss of executive function Polarized thinking    SUICIDE RISK:   Moderate:  Frequent suicidal ideation with limited intensity, and duration, some specificity in terms of plans, no associated intent, good self-control, limited dysphoria/symptomatology, some risk factors present, and identifiable protective factors, including available and accessible social support.  PLAN OF CARE: Admitted as first psychiatric hospitalization voluntarily to [facility] for depression and suicidal ideation with plan of jumping out of bridge but her fiance x 9 years prevented and brought her to [facility]. She will be starting her medication prozac  And participate in group therapy.   I certify that inpatient services furnished can reasonably be expected to improve the patient's condition.  Shizuo Biskup,JANARDHAHA R. 02/25/2013, 9:31 AM

## 2013-02-25 NOTE — Tx Team (Signed)
Interdisciplinary Treatment Plan Update   Date Reviewed:  02/25/2013  Time Reviewed:  10:07 AM  Progress in Treatment:   Attending groups: Yes Participating in groups: Yes Taking medication as prescribed: Yes  Tolerating medication: Yes Family/Significant other contact made: No, but will ask patient for consent for collateral contact Patient understands diagnosis: Yes  Discussing patient identified problems/goals with staff: Yes Medical problems stabilized or resolved: Yes Denies suicidal/homicidal ideation: Yes Patient has not harmed self or others: Yes  For review of initial/current patient goals, please see plan of care.  Estimated Length of Stay:  2-3 days  Reasons for Continued Hospitalization:  Anxiety Depression Medication stabilization Suicidal ideation  New Problems/Goals identified:    Discharge Plan or Barriers:   Home with outpatient follow up  Additional Comments:  Patient continues to endorse SI but able to contract for safety.  She rates depression and all symptoms at eight.    Attendees:  Patient:  02/25/2013 10:07 AM   Signature: Mervyn Gay, MD 02/25/2013 10:07 AM  Signature: 02/25/2013 10:07 AM  Signature: Harold Barban, RN 02/25/2013 10:07 AM  Signature: 02/25/2013 10:07 AM  Signature:   02/25/2013 10:07 AM  Signature:  Juline Patch, LCSW 02/25/2013 10:07 AM  Signature:  Cheryl Ivan, LCSW 02/25/2013 10:07 AM  Signature:  Maseta Dorley,Care Coordinator 02/25/2013 10:07 AM  Signature: Fransisca Kaufmann, [facility] 02/25/2013 10:07 AM  Signature: [facility]    Signature:    Signature:      Scribe for Treatment Team:   [employer],  02/25/2013 10:07 AM

## 2013-02-25 NOTE — B[facility] Group Notes (Signed)
[facility] LCSW Aftercare Discharge Planning Group Note   02/25/2013 12:52 PM  Participation Quality:  Appropriate  Mood/Affect:  Appropriate, Depressed and Flat  Depression Rating:  8  Anxiety Rating:  8  Thoughts of Suicide:  Yes  Will you contract for safety?   Yes  Current AVH:  No  Plan for Discharge/Comments:  Patient reports admitting to the hospital with SI and plan to jump off a bridge.  She advised of family problems as stressors.  She will need assistance with outpatient follow up and medications.  She repots having home and transportation.  Transportation Means: Patient has transportation.   Supports:  Patient has a good support system.   [name], [name]

## 2013-02-25 NOTE — Progress Notes (Signed)
Patient ID: [name], female   DOB: 28-Jul-1971, 42 y.o.   MRN: [id number] D: Patient rated her depression as 2 and anxiety as 2 on a 0-10 scale. Pt stated that "she knows the medicine is working because she feels a lot better".  Pt denies SI/HI/AVH and pain. Pt attended evening wrap up group and engaged in discussion. Pt denies any needs or concerns.  Cooperative with assessment. No acute distressed noted at this time.   A: Met with pt 1:1. Medications administered as prescribed. Writer encouraged pt to discuss feelings. Pt encouraged to come to staff with any question or concerns. 15 minutes checks for safety.  R: Patient remains safe. She is complaint with medications and denies any adverse reaction. Continue current POC.
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
## 2013-02-25 NOTE — Progress Notes (Signed)
Patient ID: [name], female   DOB: 11-Aug-1971, 42 y.o.   MRN: [id number]  Pt was pleasant and cooperative, but flat, depressed and tearful during the adm process. Stated that she started taking meds for depression in 02-12-2006, the same year her father died. Stated her mother died in Feb 12, 2006, both from cancer. Pt stated she's been on prozac and took as directed. However, when [facility] closed, she found some difficulty in getting her meds refilled. Partly because of the doctor and partly due to money.  Stated that she hasn't had any prozac for apprx 2 months. Stated she noticed an increase in agitation, crying and wanting to fight her fiance. Stated her fiance told her she needed to come get help.

## 2013-02-25 NOTE — [facility] Group Notes (Signed)
Adult Psychoeducational Group Note  Date:  02/25/2013 Time:  11:55 AM  Group Topic/Focus: Personal Goals and Values Personal Choices and Values:   The focus of this group is to help patients assess and explore the importance of values in their lives, how their values affect their decisions, how they express their values and what opposes their expression.  Participation Level:  Active  Participation Quality:  Appropriate  Affect:  Appropriate  Cognitive:  Appropriate  Insight: Appropriate  Engagement in Group:  Engaged  Modes of Intervention:  Activity and Discussion  Additional Comments:  [name] stated that she valued a close family and that at this point her family isn't close and she really wants that but the stress of trying to get her family to connect is getting to her.  She was very open and engaged in group.  [name] A 02/25/2013, 11:55 AM
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
## 2013-02-25 NOTE — [facility] Group Notes (Signed)
[facility] LCSW Group Therapy  Emotional Regulation 1:15 - 2: 30 PM        02/25/2013  1:19 PM   Type of Therapy:  Group Therapy  Participation Level:  Appropriate  Participation Quality:  Appropriate  Affect:  Appropriate  Cognitive:  Attentive Appropriate  Insight:  Engaged  Engagement in Therapy:  Engaged  Modes of Intervention:  Discussion Exploration Problem-Solving Supportive  Summary of Progress/Problems:  Group topic was emotional regulations.  Patient participated in the discussion and was able to identify frustration as the emotion that needed to regulated.  Patient shared she needs to take her emotions so that she does not have to go the frustration that comes with the negative symptoms of depression.  [name] 02/25/2013 1:19 PM

## 2013-02-25 NOTE — Progress Notes (Signed)
NUTRITION ASSESSMENT  Pt identified as at risk on the Malnutrition Screen Tool  INTERVENTION: 1. Educated patient on the importance of nutrition and encouraged intake of food and beverages. 2. Discussed weight goals.  NUTRITION DIAGNOSIS: Unintentional weight loss related to sub-optimal intake as evidenced by pt report.   Goal: Pt to meet >/= 90% of their estimated nutrition needs.  Monitor:  PO intake  Assessment:  Patient admitted with depression.  Patient states that she has had a poor appetite for >2 months.  Patient stated that she weighed 300 lbs 6 months ago, however, based on weight hx UBW is approximately 160 lbs 1 year ago.  Currently eating well.  42 y.o. female  Height: Ht Readings from Last 1 Encounters:  02/25/13 5\' 1"  (1.549 m)    Weight: Wt Readings from Last 1 Encounters:  02/25/13 141 lb (63.957 kg)    Weight Hx: Wt Readings from Last 10 Encounters:  02/25/13 141 lb (63.957 kg)  09/01/12 139 lb (63.05 kg)  07/09/12 135 lb (61.236 kg)  05/05/12 147 lb 3.2 oz (66.769 kg)  11/12/11 160 lb (72.576 kg)  06/25/11 163 lb 1.6 oz (73.982 kg)  04/09/11 173 lb 1.6 oz (78.518 kg)    BMI:  Body mass index is 26.66 kg/(m^2). Pt meets criteria for overweight based on current BMI.  Estimated Nutritional Needs: Kcal: 25-30 kcal/kg Protein: > 1 gram protein/kg Fluid: 1 ml/kcal  Diet Order: General Pt is also offered choice of unit snacks mid-morning and mid-afternoon.  Pt is eating as desired.   Lab results and medications reviewed.   Oran Rein, RD, LDN Clinical Inpatient Dietitian Pager:  [phone] Weekend and after hours pager:  [phone]

## 2013-02-25 NOTE — Progress Notes (Signed)
Adult Psychoeducational Group Note  Date:  02/25/2013 Time:  8;00PM Group Topic/Focus:  Wrap-Up Group:   The focus of this group is to help patients review their daily goal of treatment and discuss progress on daily workbooks.  Participation Level:  Active  Participation Quality:  Appropriate and Attentive  Affect:  Appropriate  Cognitive:  Alert and Appropriate  Insight: Appropriate  Engagement in Group:  Engaged  Modes of Intervention:  Discussion  Additional Comments:  Pt. Was attentive and appropriate during tonight's group discussion. Pt stated that she can tell the medication is working and she is feeling a lot better. Pt stated that she is trying to med management. Pt had a visitor today and stated that she has a good support system.   Bing Plume D 02/25/2013, 9:27 PM

## 2013-02-26 DIAGNOSIS — F332 Major depressive disorder, recurrent severe without psychotic features: Principal | ICD-10-CM

## 2013-02-26 NOTE — Progress Notes (Signed)
D:  Patient's self inventory sheet, patient sleeps well, improving appetite, normal energy level, improving attention span.  Rated depression and hopelessness #2.  Has experienced chilling in past 24 hours.  Denied SI.  Denied physical problems.  Worst pain #1.  After discharge, plans to take all her medications.  Thanks for all that you do.  Does have discharge plans.  Will have problems taking meds after discharge.  "Getting them filled without money or medicine to get meds filled.  A:  Medications administered per MD orders.  Emotional support and encouragement given pt. R:  Denied SI and HI.   Denied A/V hallucinations.  Will continue to monitor patient for safety with 15 minute checks.  Safety maintained.

## 2013-02-26 NOTE — B[facility] Group Notes (Signed)
[facility] LCSW Group Therapy  [facility]  1:15 - 3: 30      02/26/2013  1:19 PM    Type of Therapy:  Group Therapy  Participation Level:  Appropriate  Participation Quality:  Appropriate  Affect:  Appropriate  Cognitive:  Attentive Appropriate  Insight:  Engaged  Engagement in Therapy:  Engaged  Modes of Intervention:  Discussion Exploration Problem-Solving Supportive  Summary of Progress/Problems: Patient listened attentively to speaker from [facility].  She asked questions about the program and stated she has taken a class through [facility] at [facility].  She shared she plans to follow up with additional classes.   Wynn Banker 02/26/2013  1:19 PM

## 2013-02-26 NOTE — Progress Notes (Signed)
[facility] MD Progress Note  02/26/2013 2:16 PM [name]  MRN:  [id number] Subjective:  Patient has been suffering with severe symptoms of depression and suicidal thoughts with plan of killing herself by overdose on her medication. She has other plans of jumping over the bridge but contract for safety on the unit. She is willing to receive therapies and medication. She has denied adverse effects of prozac and has fine sleep last night.    Diagnosis:  Axis I: Major Depression, Recurrent severe  ADL's:  Intact  Sleep: Fair  Appetite:  Fair  Suicidal Ideation:  Plan:  Yes Intent:  YES Means:  YES Homicidal Ideation:  denied AEB (as evidenced by):  Psychiatric Specialty Exam: ROS  Blood pressure 100/69, pulse 72, temperature 97.9 F (36.6 C), temperature source Oral, resp. rate 18, height 5\' 1"  (1.549 m), weight 63.957 kg (141 lb).Body mass index is 26.66 kg/(m^2).  General Appearance: Disheveled and Guarded  Eye Contact::  Minimal  Speech:  Clear and Coherent and Slow  Volume:  Normal  Mood:  Anxious, Depressed, Dysphoric, Hopeless and Worthless  Affect:  Depressed and Flat  Thought Process:  Goal Directed and Linear  Orientation:  Full (Time, Place, and Person)  Thought Content:  Rumination  Suicidal Thoughts:  Yes.  with intent/plan  Homicidal Thoughts:  No  Memory:  Immediate;   Fair Recent;   Fair  Judgement:  Impaired  Insight:  Lacking  Psychomotor Activity:  Psychomotor Retardation  Concentration:  Fair  Recall:  Fair  Akathisia:  NA  Handed:  Right  AIMS (if indicated):     Assets:  Communication Skills Desire for Improvement Physical Health Social Support  Sleep:  Number of Hours: 5.75   Current Medications: Current Facility-Administered Medications  Medication Dose Route Frequency Provider Last Rate Last Dose  . acetaminophen (TYLENOL) tablet 650 mg  650 mg Oral Q6H PRN Kerry Hough, PA-C      . albuterol (PROVENTIL HFA;VENTOLIN HFA) 108 (90 BASE)  MCG/ACT inhaler 2 puff  2 puff Inhalation Q4H PRN Kerry Hough, PA-C      . alum & mag hydroxide-simeth (MAALOX/MYLANTA) 200-200-20 MG/5ML suspension 30 mL  30 mL Oral Q4H PRN Kerry Hough, PA-C      . famotidine (PEPCID) tablet 20 mg  20 mg Oral BID Kerry Hough, PA-C   20 mg at 02/26/13 0800  . FLUoxetine (PROZAC) capsule 20 mg  20 mg Oral Daily Kerry Hough, PA-C   20 mg at 02/26/13 0802  . magnesium hydroxide (MILK OF MAGNESIA) suspension 30 mL  30 mL Oral Daily PRN Kerry Hough, PA-C      . methocarbamol (ROBAXIN) tablet 500 mg  500 mg Oral TID PRN Kerry Hough, PA-C      . mometasone-formoterol (DULERA) 200-5 MCG/ACT inhaler 2 puff  2 puff Inhalation BID Kerry Hough, PA-C   2 puff at 02/26/13 0800  . nicotine (NICODERM CQ - dosed in mg/24 hours) patch 14 mg  14 mg Transdermal Q0600 Kerry Hough, PA-C   14 mg at 02/26/13 0645  . traMADol (ULTRAM) tablet 50 mg  50 mg Oral Q6H PRN Kerry Hough, PA-C   50 mg at 02/26/13 0805  . traZODone (DESYREL) tablet 50 mg  50 mg Oral QHS PRN Kerry Hough, PA-C   50 mg at 02/25/13 2141    Lab Results:  Results for orders placed during the hospital encounter of 02/25/13 (from the past 48  hour(s))  CBC     Status: Abnormal   Collection Time    02/25/13  6:30 AM      Result Value Range   WBC 7.5  4.0 - 10.5 K/uL   RBC 4.48  3.87 - 5.11 MIL/uL   Hemoglobin 12.3  12.0 - 15.0 g/dL   HCT 16.1 (*) 09.6 - 04.5 %   MCV 78.3  78.0 - 100.0 fL   MCH 27.5  26.0 - 34.0 pg   MCHC 35.0  30.0 - 36.0 g/dL   RDW 40.9  81.1 - 91.4 %   Platelets 278  150 - 400 K/uL  COMPREHENSIVE METABOLIC PANEL     Status: Abnormal   Collection Time    02/25/13  6:30 AM      Result Value Range   Sodium 137  135 - 145 mEq/L   Potassium 3.5  3.5 - 5.1 mEq/L   Chloride 105  96 - 112 mEq/L   CO2 25  19 - 32 mEq/L   Glucose, Bld 127 (*) 70 - 99 mg/dL   BUN 7  6 - 23 mg/dL   Creatinine, Ser 7.82  0.50 - 1.10 mg/dL   Calcium 8.7  8.4 - 95.6 mg/dL    Total Protein 6.7  6.0 - 8.3 g/dL   Albumin 3.6  3.5 - 5.2 g/dL   AST 11  0 - 37 U/L   ALT 7  0 - 35 U/L   Alkaline Phosphatase 56  39 - 117 U/L   Total Bilirubin 0.5  0.3 - 1.2 mg/dL   GFR calc non Af Amer >90  >90 mL/min   GFR calc Af Amer >90  >90 mL/min   Comment:            The eGFR has been calculated     using the CKD EPI equation.     This calculation has not been     validated in all clinical     situations.     eGFR's persistently     <90 mL/min signify     possible Chronic Kidney Disease.  HEMOGLOBIN A1C     Status: None   Collection Time    02/25/13  6:30 AM      Result Value Range   Hemoglobin A1C 4.6  <5.7 %   Comment: (NOTE)                                                                               According to the ADA Clinical Practice Recommendations for 2011, when     HbA1c is used as a screening test:      >=6.5%   Diagnostic of Diabetes Mellitus               (if abnormal result is confirmed)     5.7-6.4%   Increased risk of developing Diabetes Mellitus     References:Diagnosis and Classification of Diabetes Mellitus,Diabetes     Care,2011,34(Suppl 1):S62-S69 and Standards of Medical Care in             Diabetes - 2011,Diabetes Care,2011,34 (Suppl 1):S11-S61.   Mean Plasma Glucose 85  <117 mg/dL  MAGNESIUM  Status: None   Collection Time    02/25/13  6:30 AM      Result Value Range   Magnesium 2.0  1.5 - 2.5 mg/dL  LIPID PANEL     Status: None   Collection Time    02/25/13  6:30 AM      Result Value Range   Cholesterol 143  0 - 200 mg/dL   Triglycerides 48  <161 mg/dL   HDL 54  >09 mg/dL   Total CHOL/HDL Ratio 2.6     VLDL 10  0 - 40 mg/dL   LDL Cholesterol 79  0 - 99 mg/dL   Comment:            Total Cholesterol/HDL:CHD Risk     Coronary Heart Disease Risk Table                         Men   Women      1/2 Average Risk   3.4   3.3      Average Risk       5.0   4.4      2 X Average Risk   9.6   7.1      3 X Average Risk  23.4   11.0                 Use the calculated Patient Ratio     above and the CHD Risk Table     to determine the patient's CHD Risk.                ATP III CLASSIFICATION (LDL):      <100     mg/dL   Optimal      604-540  mg/dL   Near or Above                        Optimal      130-159  mg/dL   Borderline      981-191  mg/dL   High      >478     mg/dL   Very High  TSH     Status: None   Collection Time    02/25/13  6:30 AM      Result Value Range   TSH 1.630  0.350 - 4.500 uIU/mL  BILIRUBIN, DIRECT     Status: None   Collection Time    02/25/13  6:30 AM      Result Value Range   Bilirubin, Direct <0.1  0.0 - 0.3 mg/dL  PREGNANCY, URINE     Status: None   Collection Time    02/25/13  6:39 AM      Result Value Range   Preg Test, Ur NEGATIVE  NEGATIVE   Comment:            THE SENSITIVITY OF THIS     METHODOLOGY IS >20 mIU/mL.  URINALYSIS, ROUTINE W REFLEX MICROSCOPIC     Status: Abnormal   Collection Time    02/25/13  6:39 AM      Result Value Range   Color, Urine YELLOW  YELLOW   APPearance CLOUDY (*) CLEAR   Specific Gravity, Urine 1.015  1.005 - 1.030   pH 5.5  5.0 - 8.0   Glucose, UA NEGATIVE  NEGATIVE mg/dL   Hgb urine dipstick TRACE (*) NEGATIVE   Bilirubin Urine NEGATIVE  NEGATIVE   Ketones, ur NEGATIVE  NEGATIVE mg/dL  Protein, ur NEGATIVE  NEGATIVE mg/dL   Urobilinogen, UA 1.0  0.0 - 1.0 mg/dL   Nitrite POSITIVE (*) NEGATIVE   Leukocytes, UA TRACE (*) NEGATIVE  URINE MICROSCOPIC-ADD ON     Status: Abnormal   Collection Time    02/25/13  6:39 AM      Result Value Range   Squamous Epithelial / LPF RARE  RARE   WBC, UA 3-6  <3 WBC/hpf   Bacteria, UA MANY (*) RARE  URINE CULTURE     Status: None   Collection Time    02/25/13  6:39 AM      Result Value Range   Specimen Description URINE, RANDOM     Special Requests NONE     Culture  Setup Time 02/25/2013 11:59     Colony Count >=100,000 COLONIES/ML     Culture ESCHERICHIA COLI     Report Status PENDING    URINE  RAPID DRUG SCREEN (HOSP PERFORMED)     Status: None   Collection Time    02/25/13  6:40 AM      Result Value Range   Opiates NONE DETECTED  NONE DETECTED   Cocaine NONE DETECTED  NONE DETECTED   Benzodiazepines NONE DETECTED  NONE DETECTED   Amphetamines NONE DETECTED  NONE DETECTED   Tetrahydrocannabinol NONE DETECTED  NONE DETECTED   Barbiturates NONE DETECTED  NONE DETECTED   Comment:            DRUG SCREEN FOR MEDICAL PURPOSES     ONLY.  IF CONFIRMATION IS NEEDED     FOR ANY PURPOSE, NOTIFY LAB     WITHIN 5 DAYS.                LOWEST DETECTABLE LIMITS     FOR URINE DRUG SCREEN     Drug Class       Cutoff (ng/mL)     Amphetamine      1000     Barbiturate      200     Benzodiazepine   200     Tricyclics       300     Opiates          300     Cocaine          300     THC              50    Physical Findings: AIMS: Facial and Oral Movements Muscles of Facial Expression: None, normal Lips and Perioral Area: None, normal Jaw: None, normal Tongue: None, normal,Extremity Movements Upper (arms, wrists, hands, fingers): None, normal Lower (legs, knees, ankles, toes): None, normal, Trunk Movements Neck, shoulders, hips: None, normal, Overall Severity Severity of abnormal movements (highest score from questions above): None, normal Incapacitation due to abnormal movements: None, normal Patient's awareness of abnormal movements (rate only patient's report): No Awareness,    CIWA:    COWS:     Treatment Plan Summary: Daily contact with patient to assess and evaluate symptoms and progress in treatment Medication management  Plan: 1. Provided support for treatment 2. Encouraged to participate in milieu and group therapy 3. Continue 15 minute checks for safety 4. Continue prozac 20 mg daily and trazodone PRN for sleep and other prescribed medication  5. Case manager will contact with patient regarding disposition plans  Medical Decision Making Problem Points:  Established  problem, worsening (2) and Review of psycho-social stressors (1) Data Points:  Review or order clinical lab  tests (1) Review of medication regiment & side effects (2) Review of new medications or change in dosage (2)  I certify that inpatient services furnished can reasonably be expected to improve the patient's condition.   Annie Saephan,JANARDHAHA R. 02/26/2013, 2:16 PM

## 2013-02-26 NOTE — BHH Group Notes (Signed)
[facility] LCSW Aftercare Discharge Planning Group Note   02/26/2013 11:46 AM  Participation Quality:  Appropriate  Mood/Affect:  Appropriate  Depression Rating: 1  Anxiety Rating:  1  Thoughts of Suicide:  Yes  Will you contract for safety?   Yes  Current AVH:  No  Plan for Discharge/Comments:  Patient reports being much better today. She believes her progress is due to being away from problems and getting back on medications.   Transportation Means: Patient has transportation.   Supports:  Patient has a good support system.   Deckard Stuber, Joesph July

## 2013-02-26 NOTE — BHH Suicide Risk Assessment (Signed)
B[facility] INPATIENT:  Family/Significant Other Suicide Prevention Education  Suicide Prevention Education:  Education Completed; [name], [phone]; has been identified by the patient as the family member/significant other with whom the patient will be residing, and identified as the person(s) who will aid the patient in the event of a mental health crisis (suicidal ideations/suicide attempt).  With written consent from the patient, the family member/significant other has been provided the following suicide prevention education, prior to the and/or following the discharge of the patient.  The suicide prevention education provided includes the following:  Suicide risk factors  Suicide prevention and interventions  National Suicide Hotline telephone number  [facility] assessment telephone number  [facility] Emergency Assistance 911  [facility] and/or Residential Mobile Crisis Unit telephone number  Request made of family/significant other to:  Remove weapons (e.g., guns, rifles, knives), all items previously/currently identified as safety concern. Fiance reports his gun is secured.   Remove drugs/medications (over-the-counter, prescriptions, illicit drugs), all items previously/currently identified as a safety concern.  The family member/significant other verbalizes understanding of the suicide prevention education information provided.  The family member/significant other agrees to remove the items of safety concern listed above.  [name] 02/26/2013, 4:23 PM

## 2013-02-27 DIAGNOSIS — F332 Major depressive disorder, recurrent severe without psychotic features: Secondary | ICD-10-CM

## 2013-02-27 LAB — URINE CULTURE: Colony Count: >=100000

## 2013-02-27 MED ORDER — FLUOXETINE HCL 20 MG PO CAPS: 20.0000 mg | ORAL_CAPSULE | Freq: Every day | ORAL | Status: DC

## 2013-02-27 MED ORDER — FLUTICASONE-SALMETEROL 500-50 MCG/DOSE IN AEPB: 1.0000 | INHALATION_SPRAY | Freq: Two times a day (BID) | RESPIRATORY_TRACT | Status: DC

## 2013-02-27 MED ORDER — TRAZODONE HCL 50 MG PO TABS: 50.0000 mg | ORAL_TABLET | Freq: Every evening | ORAL | Status: DC | PRN

## 2013-02-27 MED ORDER — ALBUTEROL SULFATE HFA 108 (90 BASE) MCG/ACT IN AERS: 2.0000 | INHALATION_SPRAY | RESPIRATORY_TRACT | Status: DC | PRN

## 2013-02-27 NOTE — Progress Notes (Signed)
[facility] Adult Case Management Discharge Plan :  Will you be returning to the same living situation after discharge: Yes,  Patient returning home with fiance. At discharge, do you have transportation home?:Yes,  Fiance to transport patient home. Do you have the ability to pay for your medications:No.  Patient assisted with indigent medications..  Release of information consent forms completed and in the chart;  Patient's signature needed at discharge.  Patient to Follow up at: Follow-up Information   Follow up with [facility] On 03/02/2013. (Please go to [facility]'s walk in clinic on Monday, March 02, 2013 or any weekday between 8AM-3PM for medication management)    Contact information:   [address] [place], [place]   [address]  [phone]      Follow up with [facility] On 03/03/2013. (You are scheduled with Jasmine December Burkitt at 11 AM on Tuesday, March 03, 2013)    Contact information:   [address] [place], [place]   [address]  [phone]      Patient denies SI/HI:  Patient no longer endorsing SI/HI or other thoughts of self harm.  Safety Planning and Suicide Prevention discussed: .Reviewed with all patients during discharge planning group   Breland Elders, Joesph July 02/27/2013, 10:04 AM

## 2013-02-27 NOTE — Progress Notes (Signed)
Discharge Note:  Patient discharged home with fiancee.  Denied SI and HI.  Denied A/V hallucinations.  Denied pain.  Patient was given suicide prevention information which was discussed and patient stated she understood and had no questions.  Patient received all her belongings, clothing, medications, prescriptions, etc.  Patient stated she appreciated all assistance received from [facility] staff during her stay.

## 2013-02-27 NOTE — Tx Team (Signed)
Interdisciplinary Treatment Plan Update   Date Reviewed:  02/27/2013  Time Reviewed:  9:43 AM  Progress in Treatment:   Attending groups: Yes Participating in groups: Yes Taking medication as prescribed: Yes  Tolerating medication: Yes Family/Significant other contact made: Yes, contact made with fiance. Patient understands diagnosis: Yes  Discussing patient identified problems/goals with staff: Yes Medical problems stabilized or resolved: Yes Denies suicidal/homicidal ideation: Yes Patient has not harmed self or others: Yes  For review of initial/current patient goals, please see plan of care.  Estimated Length of Stay:  Discharge today Reasons for Continued Hospitalization:   New Problems/Goals identified:    Discharge Plan or Barriers:   Home with outpatient follow up with [facility]  Additional Comments:  Patient reports doing well and ready to discharge home today.  She denies SI/HI and rates all symptoms at zero.  Attendees:  Patient: [name] 02/27/2013 9:43 AM   Signature: Mervyn Gay, MD 02/27/2013 9:43 AM  Signature: 02/27/2013 9:43 AM  Signature: Harold Barban, RN 02/27/2013 9:43 AM  Signature: 02/27/2013 9:43 AM  Signature:   02/27/2013 9:43 AM  Signature:  Juline Patch, LCSW 02/27/2013 9:43 AM  Signature:  Reyes Ivan, LCSW 02/27/2013 9:43 AM  Signature:  Sharin Grave Coordinator 02/27/2013 9:43 AM  Signature: Fransisca Kaufmann, [facility] 02/27/2013 9:43 AM  Signature:    Signature:    Signature:      Scribe for Treatment Team:   Juline Patch,  02/27/2013 9:43 AM

## 2013-02-27 NOTE — Progress Notes (Signed)
D:  Patient's self inventory sheet, patient sleeps well, good appetite, high energy level, good attention span.  Rated depression and hopelessness #1.   Has experienced chilling.  Denied SI.   Denied physical problems.  Zero pain goal, zero pain.  "Take meds everyday and take one day at a time.  Thanks for all of your help."  Does have discharge plans.  No problems taking meds after discharge. A:  Medications administered per MD orders.  Emotional support and encouragement given patient. R:  Denied SI and HI.  Denied A/V hallucinations.  Denied pain.  Will continue to monitor patient for safety with 15 minute checks.  Safety maintained.

## 2013-02-27 NOTE — Discharge Summary (Signed)
Physician Discharge Summary Note  Patient:  [name] is an 42 y.o., female MRN:  [id number] DOB:  [birth year] Patient phone:  [phone] (home)  Patient address:   [address] [place] [address],   Date of Admission:  02/25/2013 Date of Discharge: 02/27/13  Reason for Admission:  Depression with SI  Discharge Diagnoses: Active Problems:   * No active hospital problems. *  Review of Systems  Constitutional: Negative.   HENT: Negative.   Eyes: Negative.   Respiratory: Negative.   Cardiovascular: Negative.   Gastrointestinal: Negative.   Genitourinary: Negative.   Musculoskeletal: Negative.   Skin: Negative.   Neurological: Negative.   Endo/Heme/Allergies: Negative.   Psychiatric/Behavioral: Positive for depression. Negative for suicidal ideas, hallucinations, memory loss and substance abuse. The patient is not nervous/anxious and does not have insomnia.    Axis Diagnosis:   AXIS I:  Major Depression, Recurrent severe AXIS II:  Deferred AXIS III:   Past Medical History  Diagnosis Date  . Allergy     pollen, pcn  . Asthma   . Fibroids   . History of ovarian cyst   . Sickle cell anemia     has the trait  . Menometrorrhagia   . Sickle cell trait   . Depression   . Medical history non-contributory     sickle cell   AXIS IV:  economic problems, housing problems, other psychosocial or environmental problems, problems related to social environment and problems with primary support group AXIS V:  61-70 mild symptoms  Level of Care:  OP  Hospital Course:  [name] is an 42 y.o. female. Patient presents as a walk in with her fiance. Patient states that she has been off her Prozac for 2 months due to a change in providers. Since then she has had increased mood swings, burst of anger, daily crying spells, increased anxiety and suicidal ideation with thoughts of cutting herself, hitting herself upside her head, taking a bottle of pills and jumping  over the side of a bridge. Patient states that she made a previous gesture in 2004 where she had a handful of pills and was about to take it when her daughter walked in on her. She is tearful and unable to contract for safety and is in the need of inpatient crisis stabilization.      The duration of stay was two days. The patient was seen and evaluated by the Treatment team consisting of Psychiatrist, NP-C, RN, Case Manager, and Therapist for evaluation and treatment plan with goal of stabilization upon discharge. The patient's physical and mental health problems were identified and treated appropriately.      Multiple modalities of treatment were used including medication, individual and group therapies, unit programming, improved nutrition, physical activity, and family sessions as needed. Prior to admission patient had been off her medication for two weeks and experienced and increase in depression and unstable behaviors. Patient's Prozac 20 mg was reordered along with Trazodone 50 mg to improve the quality of patient's sleep. The patient denied side effects to these medications when asked daily by the provider.      The symptoms of depression were monitored daily by evaluation by clinical provider.  The patient's mental and emotional status was evaluated by a daily self inventory completed by the patient. Patient reported to multiple staff that her mood had greatly improved since being back on her antidepressant. Improvement was demonstrated by declining numbers on the self assessment, improving vital signs, increased  cognition, and improvement in mood, sleep, appetite as well as a reduction in physical symptoms.       The patient was evaluated and found to be stable enough for discharge and was released to home per the initial plan of treatment. Patient was given prescriptions for her medications and supply of medicine. She denied SI and rated depression at one on day of discharge. Patient reported goal of  being medication compliant after her discharge. She expressed more hope about her future and felt better able to handle stressors in a heathy manner. The patient agrees to follow up as suggested at [facility] to ensure continued compliance with medications.   Mental Status Exam:  For mental status exam please see mental status exam and  suicide risk assessment completed by attending physician prior to discharge.  Consults:  None  Significant Diagnostic Studies:  labs: Chem profile, CBC, UDS, UA  Discharge Vitals:   Blood pressure 103/68, pulse 72, temperature 97.7 F (36.5 C), temperature source Oral, resp. rate 16, height 5\' 1"  (1.549 m), weight 63.957 kg (141 lb). Body mass index is 26.66 kg/(m^2). Lab Results:   Results for orders placed during the hospital encounter of 02/25/13 (from the past 72 hour(s))  CBC     Status: Abnormal   Collection Time    02/25/13  6:30 AM      Result Value Range   WBC 7.5  4.0 - 10.5 K/uL   RBC 4.48  3.87 - 5.11 MIL/uL   Hemoglobin 12.3  12.0 - 15.0 g/dL   HCT 08.6 (*) 57.8 - 46.9 %   MCV 78.3  78.0 - 100.0 fL   MCH 27.5  26.0 - 34.0 pg   MCHC 35.0  30.0 - 36.0 g/dL   RDW 62.9  52.8 - 41.3 %   Platelets 278  150 - 400 K/uL  COMPREHENSIVE METABOLIC PANEL     Status: Abnormal   Collection Time    02/25/13  6:30 AM      Result Value Range   Sodium 137  135 - 145 mEq/L   Potassium 3.5  3.5 - 5.1 mEq/L   Chloride 105  96 - 112 mEq/L   CO2 25  19 - 32 mEq/L   Glucose, Bld 127 (*) 70 - 99 mg/dL   BUN 7  6 - 23 mg/dL   Creatinine, Ser 2.44  0.50 - 1.10 mg/dL   Calcium 8.7  8.4 - 01.0 mg/dL   Total Protein 6.7  6.0 - 8.3 g/dL   Albumin 3.6  3.5 - 5.2 g/dL   AST 11  0 - 37 U/L   ALT 7  0 - 35 U/L   Alkaline Phosphatase 56  39 - 117 U/L   Total Bilirubin 0.5  0.3 - 1.2 mg/dL   GFR calc non Af Amer >90  >90 mL/min   GFR calc Af Amer >90  >90 mL/min   Comment:            The eGFR has been calculated     using the CKD EPI equation.     This  calculation has not been     validated in all clinical     situations.     eGFR's persistently     <90 mL/min signify     possible Chronic Kidney Disease.  HEMOGLOBIN A1C     Status: None   Collection Time    02/25/13  6:30 AM      Result Value Range  Hemoglobin A1C 4.6  <5.7 %   Comment: (NOTE)                                                                               According to the ADA Clinical Practice Recommendations for 2011, when     HbA1c is used as a screening test:      >=6.5%   Diagnostic of Diabetes Mellitus               (if abnormal result is confirmed)     5.7-6.4%   Increased risk of developing Diabetes Mellitus     References:Diagnosis and Classification of Diabetes Mellitus,Diabetes     Care,2011,34(Suppl 1):S62-S69 and Standards of Medical Care in             Diabetes - 2011,Diabetes Care,2011,34 (Suppl 1):S11-S61.   Mean Plasma Glucose 85  <117 mg/dL  MAGNESIUM     Status: None   Collection Time    02/25/13  6:30 AM      Result Value Range   Magnesium 2.0  1.5 - 2.5 mg/dL  LIPID PANEL     Status: None   Collection Time    02/25/13  6:30 AM      Result Value Range   Cholesterol 143  0 - 200 mg/dL   Triglycerides 48  <295 mg/dL   HDL 54  >28 mg/dL   Total CHOL/HDL Ratio 2.6     VLDL 10  0 - 40 mg/dL   LDL Cholesterol 79  0 - 99 mg/dL   Comment:            Total Cholesterol/HDL:CHD Risk     Coronary Heart Disease Risk Table                         Men   Women      1/2 Average Risk   3.4   3.3      Average Risk       5.0   4.4      2 X Average Risk   9.6   7.1      3 X Average Risk  23.4   11.0                Use the calculated Patient Ratio     above and the CHD Risk Table     to determine the patient's CHD Risk.                ATP III CLASSIFICATION (LDL):      <100     mg/dL   Optimal      413-244  mg/dL   Near or Above                        Optimal      130-159  mg/dL   Borderline      010-272  mg/dL   High      >536     mg/dL   Very  High  TSH     Status: None   Collection Time    02/25/13  6:30 AM  Result Value Range   TSH 1.630  0.350 - 4.500 uIU/mL  BILIRUBIN, DIRECT     Status: None   Collection Time    02/25/13  6:30 AM      Result Value Range   Bilirubin, Direct <0.1  0.0 - 0.3 mg/dL  PREGNANCY, URINE     Status: None   Collection Time    02/25/13  6:39 AM      Result Value Range   Preg Test, Ur NEGATIVE  NEGATIVE   Comment:            THE SENSITIVITY OF THIS     METHODOLOGY IS >20 mIU/mL.  URINALYSIS, ROUTINE W REFLEX MICROSCOPIC     Status: Abnormal   Collection Time    02/25/13  6:39 AM      Result Value Range   Color, Urine YELLOW  YELLOW   APPearance CLOUDY (*) CLEAR   Specific Gravity, Urine 1.015  1.005 - 1.030   pH 5.5  5.0 - 8.0   Glucose, UA NEGATIVE  NEGATIVE mg/dL   Hgb urine dipstick TRACE (*) NEGATIVE   Bilirubin Urine NEGATIVE  NEGATIVE   Ketones, ur NEGATIVE  NEGATIVE mg/dL   Protein, ur NEGATIVE  NEGATIVE mg/dL   Urobilinogen, UA 1.0  0.0 - 1.0 mg/dL   Nitrite POSITIVE (*) NEGATIVE   Leukocytes, UA TRACE (*) NEGATIVE  URINE MICROSCOPIC-ADD ON     Status: Abnormal   Collection Time    02/25/13  6:39 AM      Result Value Range   Squamous Epithelial / LPF RARE  RARE   WBC, UA 3-6  <3 WBC/hpf   Bacteria, UA MANY (*) RARE  URINE CULTURE     Status: None   Collection Time    02/25/13  6:39 AM      Result Value Range   Specimen Description URINE, RANDOM     Special Requests NONE     Culture  Setup Time 02/25/2013 11:59     Colony Count >=100,000 COLONIES/ML     Culture ESCHERICHIA COLI     Report Status 02/27/2013 FINAL     Organism ID, Bacteria ESCHERICHIA COLI    URINE RAPID DRUG SCREEN (HOSP PERFORMED)     Status: None   Collection Time    02/25/13  6:40 AM      Result Value Range   Opiates NONE DETECTED  NONE DETECTED   Cocaine NONE DETECTED  NONE DETECTED   Benzodiazepines NONE DETECTED  NONE DETECTED   Amphetamines NONE DETECTED  NONE DETECTED    Tetrahydrocannabinol NONE DETECTED  NONE DETECTED   Barbiturates NONE DETECTED  NONE DETECTED   Comment:            DRUG SCREEN FOR MEDICAL PURPOSES     ONLY.  IF CONFIRMATION IS NEEDED     FOR ANY PURPOSE, NOTIFY LAB     WITHIN 5 DAYS.                LOWEST DETECTABLE LIMITS     FOR URINE DRUG SCREEN     Drug Class       Cutoff (ng/mL)     Amphetamine      1000     Barbiturate      200     Benzodiazepine   200     Tricyclics       300     Opiates          300  Cocaine          300     THC              50    Physical Findings: AIMS: Facial and Oral Movements Muscles of Facial Expression: None, normal Lips and Perioral Area: None, normal Jaw: None, normal Tongue: None, normal,Extremity Movements Upper (arms, wrists, hands, fingers): None, normal Lower (legs, knees, ankles, toes): None, normal, Trunk Movements Neck, shoulders, hips: None, normal, Overall Severity Severity of abnormal movements (highest score from questions above): None, normal Incapacitation due to abnormal movements: None, normal Patient's awareness of abnormal movements (rate only patient's report): No Awareness, Dental Status Current problems with teeth and/or dentures?: No Does patient usually wear dentures?: No  CIWA:  CIWA-Ar Total: 2 COWS:  COWS Total Score: 1  Psychiatric Specialty Exam: See Psychiatric Specialty Exam and Suicide Risk Assessment completed by Attending Physician prior to discharge.  Discharge destination:  Home  Is patient on multiple antipsychotic therapies at discharge:  No   Has Patient had three or more failed trials of antipsychotic monotherapy by history:  No  Recommended Plan for Multiple Antipsychotic Therapies: N/A     Medication List    STOP taking these medications       FLUoxetine 20 MG tablet  Commonly known as:  PROZAC  Replaced by:  FLUoxetine 20 MG capsule      TAKE these medications     Indication   albuterol 108 (90 BASE) MCG/ACT inhaler   Commonly known as:  PROVENTIL HFA;VENTOLIN HFA  Inhale 2 puffs into the lungs every 4 (four) hours as needed for wheezing or shortness of breath.   Indication:  Asthma     FLUoxetine 20 MG capsule  Commonly known as:  PROZAC  Take 1 capsule (20 mg total) by mouth daily. For depression   Indication:  Depression     Fluticasone-Salmeterol 500-50 MCG/DOSE Aepb  Commonly known as:  ADVAIR  Inhale 1 puff into the lungs every 12 (twelve) hours.   Indication:  Asthma     traZODone 50 MG tablet  Commonly known as:  DESYREL  Take 1 tablet (50 mg total) by mouth at bedtime as needed for sleep.   Indication:  Trouble Sleeping, Major Depressive Disorder           Follow-up Information   Follow up with [facility] On 03/02/2013. (Please go to [facility]'s walk in clinic on Monday, March 02, 2013 or any weekday between 8AM-3PM for medication management)    Contact information:   [address] [place], [place]   [address]  [phone]      Follow up with [facility] On 03/03/2013. (You are scheduled with Jasmine December Burkitt at 11 AM on Tuesday, March 03, 2013)    Contact information:   [address] [place], [place]   [address]  [phone]      Follow-up recommendations:  Activity:  Resume usual activities.  Diet:  Regular.   Comments:   Take all your medications as prescribed by your mental healthcare provider.  Report any adverse effects and or reactions from your medicines to your outpatient provider promptly.  Patient is instructed and cautioned to not engage in alcohol and or illegal drug use while on prescription medicines.  In the event of worsening symptoms, patient is instructed to call the crisis hotline, 911 and or go to the nearest ED for appropriate evaluation and treatment of symptoms.  Follow-up with your primary care  provider for your other medical issues, concerns and or health care needs.   Total Discharge Time:  Greater than 30  minutes. SignedFransisca Kaufmann NP-C 02/27/2013, 10:02 AM  Patient is personally met, examined, case discussed with treatment team and treatment plan made and reviewed the information documented and agree with the treatment plan.  Torben Soloway,JANARDHAHA R. 02/27/2013 7:58 PM

## 2013-02-27 NOTE — Progress Notes (Signed)
Patient ID: [name], female   DOB: [birth year], 42 y.o.   MRN: [id number] D: Patient rated her depression as 0 and anxiety as 0 on a 0-10 scale. Pt stated she continues to feel better.  Pt stated excited about discharge tomorrow and will continue to take medication as prescribed. Pt denies SI/HI/AVH and pain. Pt attended evening karaoke group and participated. Pt denies any needs or concerns.  Cooperative with assessment. No acute distressed noted at this time.   A: Met with pt 1:1. Medications administered as prescribed. Writer encouraged pt to discuss feelings. Pt encouraged to come to staff with any question or concerns. 15 minutes checks for safety.  R: Patient remains safe. She is complaint with medications and denies any adverse reaction. Continue current POC.

## 2013-02-27 NOTE — Progress Notes (Addendum)
Adult Psychoeducational Group Note  Date:  02/27/2013 Time:  1:29 PM  Group Topic/Focus:  Relapse Prevention Planning:   The focus of this group is to define relapse and discuss the need for planning to combat relapse.  Participation Level:  Active  Participation Quality:  Appropriate and Attentive  Affect:  Appropriate  Cognitive:  Alert and Appropriate  Insight: Good  Engagement in Group:  Engaged  Modes of Intervention:  Discussion, Socialization and Support  Additional Comments:  [name] participated well in the therapeutic games played today. The values and goals discussed  were related to relapse prevention.   [name] 02/27/2013, 1:29 PM

## 2013-02-27 NOTE — Progress Notes (Signed)
Adult Psychoeducational Group Note  Date:  02/27/2013 Time:  12:48 PM  Group Topic/Focus:  Goals Group:   The focus of this group is to help patients establish daily goals to achieve during treatment and discuss how the patient can incorporate goal setting into their daily lives to aide in recovery.  Participation Level:  Active  Participation Quality:  Attentive, Sharing and Supportive  Affect:  Appropriate and Excited  Cognitive:  Alert and Appropriate  Insight: Appropriate  Engagement in Group:  Engaged and Supportive  Modes of Intervention:  Discussion, Socialization and Support  Additional Comments:  [name] was very active and attentive in group today. She stated that she has her plan for discharge and has resources for relapse prevention available to her for when she goes home. She gave encouragement and was very social with everyone during group.  [name] 02/27/2013, 12:48 PM

## 2013-02-27 NOTE — [facility] Suicide Risk Assessment (Signed)
Suicide Risk Assessment  Discharge Assessment     Demographic Factors:  Adolescent or young adult and Low socioeconomic status  Mental Status Per Nursing Assessment::   On Admission:     Current Mental Status by Physician: Patient is calm and coopeartive. she has normal psychomotor activity and good eye contact. she has mild depression and anxiety. she has bright and full affect. she has denied suicidal and homicidal ideation, intention and plans. she has fair insight, judgement and impulse control.   Loss Factors: Decrease in vocational status and Financial problems/change in socioeconomic status  Historical Factors: Prior suicide attempts and Family history of mental illness or substance abuse  Risk Reduction Factors:   Responsible for children under 79 years of age, Sense of responsibility to family, Religious beliefs about death, Living with another person, especially a relative, Positive social support, Positive therapeutic relationship and Positive coping skills or problem solving skills  Continued Clinical Symptoms:  Depression:   Anhedonia Recent sense of peace/wellbeing Previous Psychiatric Diagnoses and Treatments Medical Diagnoses and Treatments/Surgeries  Cognitive Features That Contribute To Risk:  Polarized thinking    Suicide Risk:  Minimal: No identifiable suicidal ideation.  Patients presenting with no risk factors but with morbid ruminations; may be classified as minimal risk based on the severity of the depressive symptoms  Discharge Diagnoses:   AXIS I:  Major Depression, Recurrent severe AXIS II:  Deferred AXIS III:   Past Medical History  Diagnosis Date  . Allergy     pollen, pcn  . Asthma   . Fibroids   . History of ovarian cyst   . Sickle cell anemia     has the trait  . Menometrorrhagia   . Sickle cell trait   . Depression   . Medical history non-contributory     sickle cell   AXIS IV:  economic problems, housing problems, other  psychosocial or environmental problems, problems related to social environment and problems with primary support group AXIS V:  51-60 moderate symptoms  Plan Of Care/Follow-up recommendations:  Activity:  as tolerated Diet:  regular  Is patient on multiple antipsychotic therapies at discharge:  No   Has Patient had three or more failed trials of antipsychotic monotherapy by history:  No  Recommended Plan for Multiple Antipsychotic Therapies: Not applicable  Derita Michelsen,JANARDHAHA R. 02/27/2013, 9:17 AM

## 2013-03-03 NOTE — Progress Notes (Signed)
Patient Discharge Instructions:  After Visit Summary (AVS):   Faxed to:  03/03/13 Discharge Summary Note:   Faxed to:  03/03/13 Psychiatric Admission Assessment Note:   Faxed to:  03/03/13 Suicide Risk Assessment - Discharge Assessment:   Faxed to:  03/03/13 Faxed/Sent to the Next Level Care provider:  03/03/13 Faxed to [facility] @ [phone] Faxed to [facility] @ [phone]  Jerelene Redden, 03/03/2013, 2:32 PM

## 2013-03-26 ENCOUNTER — Encounter (HOSPITAL_COMMUNITY): Payer: Self-pay | Admitting: Cardiology

## 2013-03-26 ENCOUNTER — Emergency Department (HOSPITAL_COMMUNITY)
Admission: EM | Admit: 2013-03-26 | Discharge: 2013-03-26 | Disposition: A | Discharge status: Home / Self Care | Payer: Self-pay | Attending: Emergency Medicine | Admitting: Emergency Medicine

## 2013-03-26 DIAGNOSIS — F329 Major depressive disorder, single episode, unspecified: Secondary | ICD-10-CM | POA: Insufficient documentation

## 2013-03-26 DIAGNOSIS — Z862 Personal history of diseases of the blood and blood-forming organs and certain disorders involving the immune mechanism: Secondary | ICD-10-CM | POA: Insufficient documentation

## 2013-03-26 DIAGNOSIS — F3289 Other specified depressive episodes: Secondary | ICD-10-CM | POA: Insufficient documentation

## 2013-03-26 DIAGNOSIS — Z760 Encounter for issue of repeat prescription: Secondary | ICD-10-CM | POA: Insufficient documentation

## 2013-03-26 DIAGNOSIS — Z8742 Personal history of other diseases of the female genital tract: Secondary | ICD-10-CM | POA: Insufficient documentation

## 2013-03-26 DIAGNOSIS — Z79899 Other long term (current) drug therapy: Secondary | ICD-10-CM | POA: Insufficient documentation

## 2013-03-26 DIAGNOSIS — Z880 Allergy status to penicillin: Secondary | ICD-10-CM | POA: Insufficient documentation

## 2013-03-26 DIAGNOSIS — J45909 Unspecified asthma, uncomplicated: Secondary | ICD-10-CM | POA: Insufficient documentation

## 2013-03-26 DIAGNOSIS — Z87891 Personal history of nicotine dependence: Secondary | ICD-10-CM | POA: Insufficient documentation

## 2013-03-26 MED ORDER — FLUOXETINE HCL 20 MG PO CAPS: 20.0000 mg | ORAL_CAPSULE | Freq: Every day | ORAL | Status: DC

## 2013-03-26 NOTE — ED Provider Notes (Signed)
History    This chart was scribed for [name], non-physician practitioner working with Juliet Rude. Rubin Payor, MD by Leone Payor, ED Scribe. This patient was seen in room TR07C/TR07C and the patient's care was started at 1619.  CSN: [id number] Arrival date & time 03/26/13  1619  First MD Initiated Contact with Patient 03/26/13 1635     Chief Complaint  Patient presents with  . Medication Refill    The history is provided by the patient. No language interpreter was used.    HPI Comments: LASHAWNDA Mason is a 42 y.o. female with a history of depression who presents to the Emergency Department requesting medication refill. States she is out of her Prozac and needs a refill. Her regular PCP moved to [facility] and so she had to find someone new. Reports that she is unable to see her new PCP until August 28th and she finished the last dose today. States she has been taking 20 mg daily for about 2-3 years and she reports this has been working well for her. She denies any recent changes in dosage.  Denies SI/HI/hallucination.  No other complaints  Past Medical History  Diagnosis Date  . Allergy     pollen, pcn  . Asthma   . Fibroids   . History of ovarian cyst   . Sickle cell anemia     has the trait  . Menometrorrhagia   . Sickle cell trait   . Depression   . Medical history non-contributory     sickle cell   Past Surgical History  Procedure Laterality Date  . Cesarean section      all three pregnancies   Family History  Problem Relation Age of Onset  . Cancer Mother     colon cancer  . Hypertension Mother   . Heart disease Father   . Cancer Father     cancer  . Other Neg Hx    History  Substance Use Topics  . Smoking status: Former Smoker -- 0.20 packs/day    Types: Cigarettes  . Smokeless tobacco: Never Used     Comment: cessation info given  . Alcohol Use: No   OB History   Grav Para Term Preterm Abortions TAB SAB Ect Mult Living   3 3 0       3     Review of  Systems  Psychiatric/Behavioral: Negative for suicidal ideas. The patient is not nervous/anxious.   All other systems reviewed and are negative.    Allergies  Bupropion hcl er (sr); Penicillins; and Pollen extract-tree extract  Home Medications   Current Outpatient Rx  Name  Route  Sig  Dispense  Refill  . albuterol (PROVENTIL HFA;VENTOLIN HFA) 108 (90 BASE) MCG/ACT inhaler   Inhalation   Inhale 2 puffs into the lungs every 4 (four) hours as needed for wheezing or shortness of breath.         Marland Kitchen FLUoxetine (PROZAC) 20 MG capsule   Oral   Take 1 capsule (20 mg total) by mouth daily. For depression   30 capsule   0   . Fluticasone-Salmeterol (ADVAIR) 500-50 MCG/DOSE AEPB   Inhalation   Inhale 1 puff into the lungs every 12 (twelve) hours.   60 each      . traZODone (DESYREL) 50 MG tablet   Oral   Take 1 tablet (50 mg total) by mouth at bedtime as needed for sleep.   30 tablet   0    BP 120/74  Pulse 80  Temp(Src) 98.1 F (36.7 C) (Oral)  Resp 16  SpO2 97% Physical Exam  Nursing note and vitals reviewed. Constitutional: She is oriented to person, place, and time. She appears well-developed and well-nourished.  HENT:  Head: Normocephalic and atraumatic.  Eyes: Conjunctivae and EOM are normal. Pupils are equal, round, and reactive to light.  Neck: Normal range of motion. Neck supple.  Cardiovascular: Normal rate, regular rhythm and normal heart sounds.   Pulmonary/Chest: Effort normal and breath sounds normal.  Abdominal: Soft. Bowel sounds are normal.  Musculoskeletal: Normal range of motion.  Neurological: She is alert and oriented to person, place, and time.  Skin: Skin is warm and dry.  Psychiatric: She has a normal mood and affect.    ED Course  Procedures (including critical care time)  DIAGNOSTIC STUDIES: Oxygen Saturation is 97% on RA, adequate by my interpretation.    COORDINATION OF CARE: 4:54 PM Discussed treatment plan with pt at bedside and pt  agreed to plan. Will refill prozac.  Pt to see new PCP on Aug 28th  Labs Reviewed - No data to display No results found. 1. Encounter for medication refill     MDM  BP 120/74  Pulse 80  Temp(Src) 98.1 F (36.7 C) (Oral)  Resp 16  SpO2 97%   I personally performed the services described in this documentation, which was scribed in my presence. The recorded information has been reviewed and is accurate.    Cheryl Helper, PA-C 03/26/13 1655

## 2013-03-26 NOTE — ED Notes (Signed)
Pt sts she is here for a medication refill, sts she ran out of her prozac today and isn't able to see her PCP until the 28th because her PCP moved and she had to get a new one. Pt in nad, skin warm and dry, resp e/u.

## 2013-03-26 NOTE — ED Notes (Signed)
Pt reports that she is out of her Prozac and needs refill. States that she ran out before and was placed in [facility] for suicidal Ideation. Reports that she is unable to get to here PCP til the 28th.

## 2013-03-28 NOTE — ED Provider Notes (Signed)
Medical screening examination/treatment/procedure(s) were performed by non-physician practitioner and as supervising physician I was immediately available for consultation/collaboration.  Kaysa Roulhac R. Amarisa Wilinski, MD 03/28/13 1358 

## 2013-04-20 ENCOUNTER — Emergency Department (HOSPITAL_COMMUNITY): Payer: Self-pay

## 2013-04-20 ENCOUNTER — Emergency Department (HOSPITAL_COMMUNITY)
Admission: EM | Admit: 2013-04-20 | Discharge: 2013-04-20 | Disposition: A | Discharge status: Home / Self Care | Payer: Self-pay | Attending: Emergency Medicine | Admitting: Emergency Medicine

## 2013-04-20 ENCOUNTER — Encounter (HOSPITAL_COMMUNITY): Payer: Self-pay | Admitting: Emergency Medicine

## 2013-04-20 DIAGNOSIS — Z8742 Personal history of other diseases of the female genital tract: Secondary | ICD-10-CM | POA: Insufficient documentation

## 2013-04-20 DIAGNOSIS — Z888 Allergy status to other drugs, medicaments and biological substances status: Secondary | ICD-10-CM | POA: Insufficient documentation

## 2013-04-20 DIAGNOSIS — F3289 Other specified depressive episodes: Secondary | ICD-10-CM | POA: Insufficient documentation

## 2013-04-20 DIAGNOSIS — R0789 Other chest pain: Secondary | ICD-10-CM

## 2013-04-20 DIAGNOSIS — Z9109 Other allergy status, other than to drugs and biological substances: Secondary | ICD-10-CM | POA: Insufficient documentation

## 2013-04-20 DIAGNOSIS — J45909 Unspecified asthma, uncomplicated: Secondary | ICD-10-CM | POA: Insufficient documentation

## 2013-04-20 DIAGNOSIS — D573 Sickle-cell trait: Secondary | ICD-10-CM | POA: Insufficient documentation

## 2013-04-20 DIAGNOSIS — R071 Chest pain on breathing: Secondary | ICD-10-CM | POA: Insufficient documentation

## 2013-04-20 DIAGNOSIS — M79609 Pain in unspecified limb: Secondary | ICD-10-CM | POA: Insufficient documentation

## 2013-04-20 DIAGNOSIS — Z87891 Personal history of nicotine dependence: Secondary | ICD-10-CM | POA: Insufficient documentation

## 2013-04-20 DIAGNOSIS — E876 Hypokalemia: Secondary | ICD-10-CM | POA: Insufficient documentation

## 2013-04-20 DIAGNOSIS — Z79899 Other long term (current) drug therapy: Secondary | ICD-10-CM | POA: Insufficient documentation

## 2013-04-20 DIAGNOSIS — Z880 Allergy status to penicillin: Secondary | ICD-10-CM | POA: Insufficient documentation

## 2013-04-20 DIAGNOSIS — F329 Major depressive disorder, single episode, unspecified: Secondary | ICD-10-CM | POA: Insufficient documentation

## 2013-04-20 LAB — COMPREHENSIVE METABOLIC PANEL
ALT: 7 U/L (ref 0–35)
Albumin: 3.7 g/dL (ref 3.5–5.2)
BUN: 8 mg/dL (ref 6–23)
Calcium: 9 mg/dL (ref 8.4–10.5)
GFR calc Af Amer: >90 mL/min (ref >90)
Glucose, Bld: 80 mg/dL (ref 70–99)
Sodium: 143 mEq/L (ref 135–145)
Total Protein: 6.5 g/dL (ref 6.0–8.3)

## 2013-04-20 LAB — CBC
HCT: 34.9 % — ABNORMAL LOW (ref 36.0–46.0)
Hemoglobin: 12.5 g/dL (ref 12.0–15.0)
MCV: 78.3 fL (ref 78.0–100.0)
WBC: 7.5 10*3/uL (ref 4.0–10.5)

## 2013-04-20 LAB — POCT I-STAT TROPONIN I

## 2013-04-20 MED ORDER — ONDANSETRON HCL 4 MG/2ML IJ SOLN
4.0000 mg | Freq: Once | INTRAMUSCULAR | Status: AC
  Administered 2013-04-20: 4 mg via INTRAVENOUS
  Filled 2013-04-20: qty 2

## 2013-04-20 MED ORDER — MORPHINE SULFATE 4 MG/ML IJ SOLN
4.0000 mg | Freq: Once | INTRAMUSCULAR | Status: AC
  Administered 2013-04-20: 4 mg via INTRAVENOUS
  Filled 2013-04-20: qty 1

## 2013-04-20 MED ORDER — POTASSIUM CHLORIDE CRYS ER 20 MEQ PO TBCR
40.0000 meq | EXTENDED_RELEASE_TABLET | Freq: Once | ORAL | Status: AC
  Administered 2013-04-20: 40 meq via ORAL
  Filled 2013-04-20: qty 2

## 2013-04-20 MED ORDER — DIAZEPAM 5 MG PO TABS
10.0000 mg | ORAL_TABLET | Freq: Once | ORAL | Status: AC
  Administered 2013-04-20: 10 mg via ORAL
  Filled 2013-04-20: qty 2

## 2013-04-20 MED ORDER — CYCLOBENZAPRINE HCL 10 MG PO TABS: 10.0000 mg | ORAL_TABLET | Freq: Two times a day (BID) | ORAL | Status: DC | PRN

## 2013-04-20 NOTE — ED Provider Notes (Signed)
CSN: [id number]     Arrival date & time 04/20/13  1348 History     First MD Initiated Contact with Patient 04/20/13 1516     Chief Complaint  Patient presents with  . Chest Pain   (Consider location/radiation/quality/duration/timing/severity/associated sxs/prior Treatment) HPI  [name] is a 42 y.o. female c/o left scapular and left arm pain starting 2 days ago, patient developed left upper chest pain starting at 2 AM, it woke her from sleep. It is discolored right described as sharp, rated at 10 out of 10, is exacerbated by both movement and palpation. Patient is taking no pain medications prior to arrival. Pain has been constant since 2 AM, it isn't associated with any shortness of breath and diaphoresis. Patient denies vomiting, fever cough, history of DVT or PE, recent immobilizations, cough, Hemoptysis, syncope, prior episodes, recent cocaine/methamphetimine use.. Patient Has Not Taken Any Aspirin or Nitroglycerin the Last 24 Hours.  RF:  Former smoker  Past Medical History  Diagnosis Date  . Allergy     pollen, pcn  . Asthma   . Fibroids   . History of ovarian cyst   . Sickle cell anemia     has the trait  . Menometrorrhagia   . Sickle cell trait   . Depression   . Medical history non-contributory     sickle cell   Past Surgical History  Procedure Laterality Date  . Cesarean section      all three pregnancies   Family History  Problem Relation Age of Onset  . Cancer Mother     colon cancer  . Hypertension Mother   . Heart disease Father   . Cancer Father     cancer  . Other Neg Hx    History  Substance Use Topics  . Smoking status: Former Smoker -- 0.20 packs/day    Types: Cigarettes  . Smokeless tobacco: Never Used     Comment: cessation info given  . Alcohol Use: No   OB History   Grav Para Term Preterm Abortions TAB SAB Ect Mult Living   3 3 0       3     Review of Systems 10 systems reviewed and found to be negative, except as noted in the  HPI   Allergies  Bupropion hcl er (sr); Penicillins; and Pollen extract-tree extract  Home Medications   Current Outpatient Rx  Name  Route  Sig  Dispense  Refill  . albuterol (PROVENTIL HFA;VENTOLIN HFA) 108 (90 BASE) MCG/ACT inhaler   Inhalation   Inhale 2 puffs into the lungs every 4 (four) hours as needed for wheezing or shortness of breath.         Marland Kitchen FLUoxetine (PROZAC) 20 MG capsule   Oral   Take 20 mg by mouth daily.         . Fluticasone-Salmeterol (ADVAIR) 500-50 MCG/DOSE AEPB   Inhalation   Inhale 1 puff into the lungs every 12 (twelve) hours.   60 each      . traZODone (DESYREL) 50 MG tablet   Oral   Take 1 tablet (50 mg total) by mouth at bedtime as needed for sleep.   30 tablet   0    BP 114/79  Pulse 102  Temp(Src) 98.5 F (36.9 C) (Oral)  Resp 18  SpO2 100% Physical Exam  Nursing note and vitals reviewed. Constitutional: She is oriented to person, place, and time. She appears well-developed and well-nourished. No distress.  HENT:  Head: Normocephalic.  Mouth/Throat: Oropharynx is clear and moist.  Eyes: Conjunctivae and EOM are normal. Pupils are equal, round, and reactive to light.  Neck: Normal range of motion.  Cardiovascular: Normal rate, regular rhythm and intact distal pulses.   Pulmonary/Chest: Effort normal and breath sounds normal. No stridor. No respiratory distress. She has no wheezes. She has no rales. She exhibits tenderness.  Patient is exquisitely tender to palpation in the left upper anterior chest and also the left scapular area she has good range of motion to the left shoulder neurovascularly intact.  Abdominal: Soft. Bowel sounds are normal. She exhibits no distension and no mass. There is no tenderness. There is no rebound and no guarding.  Musculoskeletal: Normal range of motion.  No calf asymmetry, superficial collaterals, palpable cords, edema, Homans sign negative bilaterally.    Neurological: She is alert and oriented to  person, place, and time.  Psychiatric: She has a normal mood and affect.    ED Course   Procedures (including critical care time)  Labs Reviewed  COMPREHENSIVE METABOLIC PANEL - Abnormal; Notable for the following:    Potassium 3.1 (*)    All other components within normal limits  CBC - Abnormal; Notable for the following:    HCT 34.9 (*)    All other components within normal limits  TROPONIN I  POCT I-STAT TROPONIN I   Dg Chest 2 View  04/20/2013   *RADIOLOGY REPORT*  Clinical Data: Chest pain.  CHEST - 2 VIEW  Comparison: 01/04/2013  Findings: Mild peribronchial thickening. Heart and mediastinal contours are within normal limits.  No focal opacities or effusions.  No acute bony abnormality.  IMPRESSION: Mild bronchitic changes.   Original Report Authenticated By: Charlett Nose, M.D.     Date: 04/20/2013  Rate: 87  Rhythm: normal sinus rhythm  QRS Axis: normal  Intervals: normal  ST/T Wave abnormalities: normal  Conduction Disutrbances:none  Narrative Interpretation:   Old EKG Reviewed: none available   1. Chest wall pain     MDM   Filed Vitals:   04/20/13 1435 04/20/13 1600  BP: 114/79   Pulse: 102 58  Temp: 98.5 F (36.9 C)   TempSrc: Oral   Resp: 18   SpO2: 100% 100%     [name] is a 42 y.o. female with left scapular pain turning into left anterior chest pain this a.m. Pain is been constant since since 2 AM. Troponin is negative, EKG is nonischemic, no anemia on CBC and chest x-ray shows no abnormalities. Physical exam in history of present illness do not leave me to have a high index of suspicion for DVT/PE. Patient is mildly hypokalemic. Chest pain is perfectly reproducible to palpation. This is a chest wall pain alright her for muscle relaxers and recommend anti-inflammatories.  Medications  morphine 4 MG/ML injection 4 mg (not administered)  ondansetron (ZOFRAN) injection 4 mg (not administered)    Pt is hemodynamically stable, appropriate for, and  amenable to discharge at this time. Pt verbalized understanding and agrees with care plan. All questions answered. Outpatient follow-up and specific return precautions discussed.    New Prescriptions   CYCLOBENZAPRINE (FLEXERIL) 10 MG TABLET    Take 1 tablet (10 mg total) by mouth 2 (two) times daily as needed for muscle spasms.    Note: Portions of this report may have been transcribed using voice recognition software. Every effort was made to ensure accuracy; however, inadvertent computerized transcription errors may be present     Wynetta Emery, PA-C 04/20/13  1624 

## 2013-04-20 NOTE — ED Provider Notes (Signed)
Medical screening examination/treatment/procedure(s) were performed by non-physician practitioner and as supervising physician I was immediately available for consultation/collaboration.   Richardean Canal, MD 04/20/13 2007

## 2013-04-20 NOTE — ED Notes (Signed)
Pt discharged.Vital signs stable and GCS 15 

## 2013-04-20 NOTE — ED Notes (Signed)
Pt reports left shoulder and arm pain. When asked she also reports chest pain for 3 days. Pain is stabbing and SOB.

## 2013-04-27 ENCOUNTER — Encounter (HOSPITAL_COMMUNITY): Payer: Self-pay | Admitting: Emergency Medicine

## 2013-04-27 ENCOUNTER — Emergency Department (HOSPITAL_COMMUNITY)
Admission: EM | Admit: 2013-04-27 | Discharge: 2013-04-27 | Disposition: A | Discharge status: Home / Self Care | Payer: Self-pay | Attending: Emergency Medicine | Admitting: Emergency Medicine

## 2013-04-27 DIAGNOSIS — Z862 Personal history of diseases of the blood and blood-forming organs and certain disorders involving the immune mechanism: Secondary | ICD-10-CM | POA: Insufficient documentation

## 2013-04-27 DIAGNOSIS — M62838 Other muscle spasm: Secondary | ICD-10-CM

## 2013-04-27 DIAGNOSIS — F3289 Other specified depressive episodes: Secondary | ICD-10-CM | POA: Insufficient documentation

## 2013-04-27 DIAGNOSIS — Z8742 Personal history of other diseases of the female genital tract: Secondary | ICD-10-CM | POA: Insufficient documentation

## 2013-04-27 DIAGNOSIS — M25512 Pain in left shoulder: Secondary | ICD-10-CM

## 2013-04-27 DIAGNOSIS — J45909 Unspecified asthma, uncomplicated: Secondary | ICD-10-CM | POA: Insufficient documentation

## 2013-04-27 DIAGNOSIS — M549 Dorsalgia, unspecified: Secondary | ICD-10-CM | POA: Insufficient documentation

## 2013-04-27 DIAGNOSIS — F329 Major depressive disorder, single episode, unspecified: Secondary | ICD-10-CM | POA: Insufficient documentation

## 2013-04-27 DIAGNOSIS — Z79899 Other long term (current) drug therapy: Secondary | ICD-10-CM | POA: Insufficient documentation

## 2013-04-27 DIAGNOSIS — M25519 Pain in unspecified shoulder: Secondary | ICD-10-CM | POA: Insufficient documentation

## 2013-04-27 DIAGNOSIS — Z87828 Personal history of other (healed) physical injury and trauma: Secondary | ICD-10-CM | POA: Insufficient documentation

## 2013-04-27 DIAGNOSIS — IMO0002 Reserved for concepts with insufficient information to code with codable children: Secondary | ICD-10-CM | POA: Insufficient documentation

## 2013-04-27 DIAGNOSIS — Z880 Allergy status to penicillin: Secondary | ICD-10-CM | POA: Insufficient documentation

## 2013-04-27 DIAGNOSIS — Z8542 Personal history of malignant neoplasm of other parts of uterus: Secondary | ICD-10-CM | POA: Insufficient documentation

## 2013-04-27 DIAGNOSIS — Z87891 Personal history of nicotine dependence: Secondary | ICD-10-CM | POA: Insufficient documentation

## 2013-04-27 DIAGNOSIS — M5380 Other specified dorsopathies, site unspecified: Secondary | ICD-10-CM | POA: Insufficient documentation

## 2013-04-27 MED ORDER — HYDROCODONE-ACETAMINOPHEN 5-325 MG PO TABS: 1.0000 | ORAL_TABLET | Freq: Four times a day (QID) | ORAL | Status: DC | PRN

## 2013-04-27 MED ORDER — NAPROXEN 500 MG PO TABS: 500.0000 mg | ORAL_TABLET | Freq: Two times a day (BID) | ORAL | Status: DC

## 2013-04-27 MED ORDER — METHOCARBAMOL 500 MG PO TABS: 500.0000 mg | ORAL_TABLET | Freq: Two times a day (BID) | ORAL | Status: DC

## 2013-04-27 NOTE — Progress Notes (Addendum)
04/27/13 5:30 W.Aundria Rud RN BSN [phone] ED CM consulted by RN. In to speak with patient. Pt presented to ED with back and left shoulder pain x 2 weeks. Pt states, she doesn't not have medical doctor nor health insurance. Provided patent with verbal and written information on [employer], and [employer]. Pt discharge with prescription for Naprosyn, Robaxin, and Vicodin. Pt states, [facility] Pt instructed to call and schedule a follow up appt. Pt verbalizes understanding and agrees. Also, provided a copy of the [facility] [employer]. Pt discharged to self care. No further CM needs identified
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
## 2013-04-27 NOTE — ED Notes (Signed)
Pt requesting social work to help her get an [employer] card, social work called

## 2013-04-27 NOTE — ED Notes (Signed)
Pt c/o left arm, shoulder and upper back pain x 2 weeks; pt seen here for same but sts meds not helping

## 2013-05-01 NOTE — ED Provider Notes (Signed)
Medical screening examination/treatment/procedure(s) were performed by non-physician practitioner and as supervising physician I was immediately available for consultation/collaboration.   Murice Barbar Joseph Vitali Seibert, MD 05/01/13 1111 

## 2013-05-10 ENCOUNTER — Emergency Department (HOSPITAL_COMMUNITY)
Admission: EM | Admit: 2013-05-10 | Discharge: 2013-05-10 | Disposition: A | Discharge status: Home / Self Care | Payer: Self-pay | Attending: Emergency Medicine | Admitting: Emergency Medicine

## 2013-05-10 ENCOUNTER — Encounter (HOSPITAL_COMMUNITY): Payer: Self-pay | Admitting: *Deleted

## 2013-05-10 ENCOUNTER — Emergency Department (HOSPITAL_COMMUNITY): Payer: Self-pay

## 2013-05-10 DIAGNOSIS — S46909A Unspecified injury of unspecified muscle, fascia and tendon at shoulder and upper arm level, unspecified arm, initial encounter: Secondary | ICD-10-CM | POA: Insufficient documentation

## 2013-05-10 DIAGNOSIS — Z8742 Personal history of other diseases of the female genital tract: Secondary | ICD-10-CM | POA: Insufficient documentation

## 2013-05-10 DIAGNOSIS — Z791 Long term (current) use of non-steroidal anti-inflammatories (NSAID): Secondary | ICD-10-CM | POA: Insufficient documentation

## 2013-05-10 DIAGNOSIS — Y939 Activity, unspecified: Secondary | ICD-10-CM | POA: Insufficient documentation

## 2013-05-10 DIAGNOSIS — W19XXXA Unspecified fall, initial encounter: Secondary | ICD-10-CM | POA: Insufficient documentation

## 2013-05-10 DIAGNOSIS — F3289 Other specified depressive episodes: Secondary | ICD-10-CM | POA: Insufficient documentation

## 2013-05-10 DIAGNOSIS — S4980XA Other specified injuries of shoulder and upper arm, unspecified arm, initial encounter: Secondary | ICD-10-CM | POA: Insufficient documentation

## 2013-05-10 DIAGNOSIS — Z79899 Other long term (current) drug therapy: Secondary | ICD-10-CM | POA: Insufficient documentation

## 2013-05-10 DIAGNOSIS — J45909 Unspecified asthma, uncomplicated: Secondary | ICD-10-CM | POA: Insufficient documentation

## 2013-05-10 DIAGNOSIS — F329 Major depressive disorder, single episode, unspecified: Secondary | ICD-10-CM | POA: Insufficient documentation

## 2013-05-10 DIAGNOSIS — Z3202 Encounter for pregnancy test, result negative: Secondary | ICD-10-CM | POA: Insufficient documentation

## 2013-05-10 DIAGNOSIS — Z87891 Personal history of nicotine dependence: Secondary | ICD-10-CM | POA: Insufficient documentation

## 2013-05-10 DIAGNOSIS — Z880 Allergy status to penicillin: Secondary | ICD-10-CM | POA: Insufficient documentation

## 2013-05-10 DIAGNOSIS — Z862 Personal history of diseases of the blood and blood-forming organs and certain disorders involving the immune mechanism: Secondary | ICD-10-CM | POA: Insufficient documentation

## 2013-05-10 DIAGNOSIS — Y929 Unspecified place or not applicable: Secondary | ICD-10-CM | POA: Insufficient documentation

## 2013-05-10 DIAGNOSIS — M25512 Pain in left shoulder: Secondary | ICD-10-CM

## 2013-05-10 DIAGNOSIS — IMO0001 Reserved for inherently not codable concepts without codable children: Secondary | ICD-10-CM | POA: Insufficient documentation

## 2013-05-10 LAB — POCT PREGNANCY, URINE: Preg Test, Ur: NEGATIVE

## 2013-05-10 MED ORDER — KETOROLAC TROMETHAMINE 30 MG/ML IJ SOLN
30.0000 mg | Freq: Once | INTRAMUSCULAR | Status: AC
  Administered 2013-05-10: 30 mg via INTRAMUSCULAR
  Filled 2013-05-10: qty 1

## 2013-05-10 MED ORDER — DIAZEPAM 5 MG PO TABS: 2.5000 mg | ORAL_TABLET | Freq: Four times a day (QID) | ORAL | Status: DC | PRN

## 2013-05-10 MED ORDER — HYDROCODONE-ACETAMINOPHEN 5-325 MG PO TABS: 1.0000 | ORAL_TABLET | Freq: Four times a day (QID) | ORAL | Status: DC | PRN

## 2013-05-10 MED ORDER — DIAZEPAM 5 MG PO TABS
2.5000 mg | ORAL_TABLET | Freq: Once | ORAL | Status: AC
  Administered 2013-05-10: 2.5 mg via ORAL
  Filled 2013-05-10: qty 1

## 2013-05-10 NOTE — ED Provider Notes (Signed)
CSN: [id number]     Arrival date & time 05/10/13  2101 History  This chart was scribed for non-physician practitioner, Earley Favor, FNP, working with Lyanne Co, MD by Greggory Stallion, ED scribe. This patient was seen in room WTR6/WTR6 and the patient's care was started at 10:01 PM.    Chief Complaint  Patient presents with  . Arm Injury    The history is provided by the patient. No language interpreter was used.   HPI Comments: [name] is a 42 y.o. female who presents to the Emergency Department complaining of worsening left shoulder pain that radiates to her elbow after falling and landing on left shoulder one week ago. Pt states she was seen in ED a week ago and was given pain medication and an ortho referral.  Pt reports pain medication ineffective.   Past Medical History  Diagnosis Date  . Allergy     pollen, pcn  . Asthma   . Fibroids   . History of ovarian cyst   . Sickle cell anemia     has the trait  . Menometrorrhagia   . Sickle cell trait   . Depression   . Medical history non-contributory     sickle cell   Past Surgical History  Procedure Laterality Date  . Cesarean section      all three pregnancies   Family History  Problem Relation Age of Onset  . Cancer Mother     colon cancer  . Hypertension Mother   . Heart disease Father   . Cancer Father     cancer  . Other Neg Hx    History  Substance Use Topics  . Smoking status: Former Smoker -- 0.20 packs/day    Types: Cigarettes  . Smokeless tobacco: Never Used     Comment: cessation info given  . Alcohol Use: No   OB History   Grav Para Term Preterm Abortions TAB SAB Ect Mult Living   3 3 0       3     Review of Systems  Musculoskeletal: Positive for myalgias and arthralgias.  All other systems reviewed and are negative.    Allergies  Bupropion hcl er (sr); Penicillins; Pollen extract-tree extract; and Other  Home Medications   Current Outpatient Rx  Name  Route  Sig  Dispense   Refill  . albuterol (PROVENTIL HFA;VENTOLIN HFA) 108 (90 BASE) MCG/ACT inhaler   Inhalation   Inhale 2 puffs into the lungs every 4 (four) hours as needed for wheezing or shortness of breath.         . cyclobenzaprine (FLEXERIL) 10 MG tablet   Oral   Take 1 tablet (10 mg total) by mouth 2 (two) times daily as needed for muscle spasms.   20 tablet   0   . FLUoxetine (PROZAC) 20 MG capsule   Oral   Take 20 mg by mouth daily.         . Fluticasone-Salmeterol (ADVAIR) 500-50 MCG/DOSE AEPB   Inhalation   Inhale 1 puff into the lungs every 12 (twelve) hours.   60 each      . naproxen (NAPROSYN) 500 MG tablet   Oral   Take 1 tablet (500 mg total) by mouth 2 (two) times daily.   30 tablet   0   . traZODone (DESYREL) 50 MG tablet   Oral   Take 1 tablet (50 mg total) by mouth at bedtime as needed for sleep.   30 tablet  0   . diazepam (VALIUM) 5 MG tablet   Oral   Take 0.5 tablets (2.5 mg total) by mouth every 6 (six) hours as needed for anxiety.   12 tablet   0   . HYDROcodone-acetaminophen (NORCO) 5-325 MG per tablet   Oral   Take 1 tablet by mouth every 6 (six) hours as needed for pain.   7 tablet   0    BP 109/64  Pulse 73  Temp(Src) 99.4 F (37.4 C) (Oral)  Resp 20  Ht 5\' 1"  (1.549 m)  Wt 135 lb (61.236 kg)  BMI 25.52 kg/m2  SpO2 99%  LMP 03/15/2013 Physical Exam  Nursing note and vitals reviewed. Constitutional: She is oriented to person, place, and time. She appears well-developed and well-nourished. No distress.  HENT:  Head: Normocephalic and atraumatic.  Eyes: EOM are normal.  Neck: Neck supple. No tracheal deviation present.  Cardiovascular: Normal rate and intact distal pulses.   Pulmonary/Chest: Effort normal. No respiratory distress.  Musculoskeletal: Normal range of motion.  Bicep tenderness greater than tricep tenderness. Pain with rotation in left arm. Pain with 3 planes of movement in rotator cuff.  Neurological: She is alert and  oriented to person, place, and time.  Skin: Skin is warm and dry.  Psychiatric: She has a normal mood and affect. Her behavior is normal.    ED Course  Procedures (including critical care time) DIAGNOSTIC STUDIES: Oxygen Saturation is 99% on RA, normal by my interpretation.    COORDINATION OF CARE: 10:15 PM-Discussed treatment plan which includes X-Ray with pt at bedside and pt agreed to plan. Advised pt that she has possible rotator cuff injury and she will be referred to orthopedics for follow-up.    Labs Reviewed  POCT PREGNANCY, URINE   No results found. 1. Shoulder pain, left     MDM  Patient was referred previously for her to Dr. Lajoyce Corners, who happens to be on call.  Again, I will again attempt to encourage her to followup with orthopedic surgery for further evaluation.  I have changed her Robaxin to Valium, which I feel is a better antispasmodic, renewed her Vicodin, and reviewed.  Her x-ray        I personally performed the services described in this documentation, which was scribed in my presence. The recorded information has been reviewed and is accurate.   Arman Filter, NP 05/10/13 2244
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
## 2013-05-12 NOTE — ED Provider Notes (Signed)
Medical screening examination/treatment/procedure(s) were performed by non-physician practitioner and as supervising physician I was immediately available for consultation/collaboration.   Yonis Carreon M Deann Mclaine, MD 05/12/13 2255 

## 2013-06-12 ENCOUNTER — Emergency Department (HOSPITAL_COMMUNITY)
Admission: EM | Admit: 2013-06-12 | Discharge: 2013-06-12 | Disposition: A | Discharge status: Home / Self Care | Payer: Self-pay | Attending: Emergency Medicine | Admitting: Emergency Medicine

## 2013-06-12 ENCOUNTER — Encounter (HOSPITAL_COMMUNITY): Payer: Self-pay | Admitting: *Deleted

## 2013-06-12 DIAGNOSIS — Z880 Allergy status to penicillin: Secondary | ICD-10-CM | POA: Insufficient documentation

## 2013-06-12 DIAGNOSIS — Z8742 Personal history of other diseases of the female genital tract: Secondary | ICD-10-CM | POA: Insufficient documentation

## 2013-06-12 DIAGNOSIS — F3289 Other specified depressive episodes: Secondary | ICD-10-CM | POA: Insufficient documentation

## 2013-06-12 DIAGNOSIS — J45909 Unspecified asthma, uncomplicated: Secondary | ICD-10-CM | POA: Insufficient documentation

## 2013-06-12 DIAGNOSIS — Z760 Encounter for issue of repeat prescription: Secondary | ICD-10-CM | POA: Insufficient documentation

## 2013-06-12 DIAGNOSIS — Z862 Personal history of diseases of the blood and blood-forming organs and certain disorders involving the immune mechanism: Secondary | ICD-10-CM | POA: Insufficient documentation

## 2013-06-12 DIAGNOSIS — F329 Major depressive disorder, single episode, unspecified: Secondary | ICD-10-CM | POA: Insufficient documentation

## 2013-06-12 DIAGNOSIS — Z87891 Personal history of nicotine dependence: Secondary | ICD-10-CM | POA: Insufficient documentation

## 2013-06-12 MED ORDER — TRAZODONE HCL 50 MG PO TABS: 50.0000 mg | ORAL_TABLET | Freq: Every evening | ORAL | Status: DC | PRN

## 2013-06-12 MED ORDER — FLUTICASONE-SALMETEROL 500-50 MCG/DOSE IN AEPB: 1.0000 | INHALATION_SPRAY | Freq: Two times a day (BID) | RESPIRATORY_TRACT | Status: DC

## 2013-06-12 MED ORDER — FLUOXETINE HCL 20 MG PO CAPS: 20.0000 mg | ORAL_CAPSULE | Freq: Every day | ORAL | Status: DC

## 2013-06-12 MED ORDER — LEVOCETIRIZINE DIHYDROCHLORIDE 5 MG PO TABS: 5.0000 mg | ORAL_TABLET | Freq: Every evening | ORAL | Status: DC

## 2013-06-12 MED ORDER — ALBUTEROL SULFATE HFA 108 (90 BASE) MCG/ACT IN AERS: 2.0000 | INHALATION_SPRAY | RESPIRATORY_TRACT | Status: DC | PRN

## 2013-06-12 NOTE — ED Notes (Signed)
Pt is also here for med refill because she won't see PCP until end October.  Meds include Advair 500/50, Ventolin HFA 50 mg inhaler, Xyzal 5 mg, Tramadol 50 mg, Trazadone 5 mg, Hydrocodone 5 mg, and Prozac 20 mg.

## 2013-06-12 NOTE — ED Provider Notes (Signed)
CSN: [id number]     Arrival date & time 06/12/13  1615 History  This chart was scribed for non-physician practitioner working with Gwyneth Sprout, MD by Ronal Fear, ED scribe. This patient was seen in room TR08C/TR08C and the patient's care was started at 6:48 PM.    Chief Complaint  Patient presents with  . med refill     The history is provided by the patient. No language interpreter was used.  HPI Comments: [name] is a 42 y.o. female who presents to the Emergency Department needing multiple medications refilled. Pt states that she has a family emergency that she must attend to and cannot get in to see her PCP until next month.  She does not seem to be in any acute distress, homicidal or suicidal.  Pt has no other complaints.   Past Medical History  Diagnosis Date  . Allergy     pollen, pcn  . Asthma   . Fibroids   . History of ovarian cyst   . Sickle cell anemia     has the trait  . Menometrorrhagia   . Sickle cell trait   . Depression   . Medical history non-contributory     sickle cell   Past Surgical History  Procedure Laterality Date  . Cesarean section      all three pregnancies   Family History  Problem Relation Age of Onset  . Cancer Mother     colon cancer  . Hypertension Mother   . Heart disease Father   . Cancer Father     cancer  . Other Neg Hx    History  Substance Use Topics  . Smoking status: Former Smoker -- 0.20 packs/day    Types: Cigarettes  . Smokeless tobacco: Never Used     Comment: cessation info given  . Alcohol Use: No   OB History   Grav Para Term Preterm Abortions TAB SAB Ect Mult Living   3 3 0       3     Review of Systems  Constitutional: Negative for fever and chills.  Respiratory: Negative for shortness of breath and wheezing.   Cardiovascular: Negative for chest pain.  Gastrointestinal: Negative for abdominal pain.  Psychiatric/Behavioral: Negative for suicidal ideas.    Allergies  Bupropion hcl er (sr);  Penicillins; Pollen extract-tree extract; and Other  Home Medications   Current Outpatient Rx  Name  Route  Sig  Dispense  Refill  . cyclobenzaprine (FLEXERIL) 10 MG tablet   Oral   Take 1 tablet (10 mg total) by mouth 2 (two) times daily as needed for muscle spasms.   20 tablet   0   . diazepam (VALIUM) 5 MG tablet   Oral   Take 0.5 tablets (2.5 mg total) by mouth every 6 (six) hours as needed for anxiety.   12 tablet   0   . HYDROcodone-acetaminophen (NORCO) 5-325 MG per tablet   Oral   Take 1 tablet by mouth every 6 (six) hours as needed for pain.   7 tablet   0   . naproxen (NAPROSYN) 500 MG tablet   Oral   Take 1 tablet (500 mg total) by mouth 2 (two) times daily.   30 tablet   0   . albuterol (PROVENTIL HFA;VENTOLIN HFA) 108 (90 BASE) MCG/ACT inhaler   Inhalation   Inhale 2 puffs into the lungs every 4 (four) hours as needed for wheezing or shortness of breath.   1 Inhaler  0   . FLUoxetine (PROZAC) 20 MG capsule   Oral   Take 1 capsule (20 mg total) by mouth daily.   30 capsule   0   . Fluticasone-Salmeterol (ADVAIR) 500-50 MCG/DOSE AEPB   Inhalation   Inhale 1 puff into the lungs every 12 (twelve) hours.   60 each   0   . levocetirizine (XYZAL) 5 MG tablet   Oral   Take 1 tablet (5 mg total) by mouth every evening.   30 tablet   0   . traZODone (DESYREL) 50 MG tablet   Oral   Take 1 tablet (50 mg total) by mouth at bedtime as needed for sleep.   30 tablet   0    BP 111/87  Pulse 101  Temp(Src) 98.4 F (36.9 C) (Oral)  Resp 16  SpO2 100% Physical Exam  Nursing note and vitals reviewed. Constitutional: She is oriented to person, place, and time. She appears well-developed and well-nourished. No distress.  HENT:  Head: Normocephalic and atraumatic.  Eyes: EOM are normal.  Neck: Neck supple. No tracheal deviation present.  Cardiovascular: Normal rate.   Pulmonary/Chest: Effort normal. No respiratory distress. She has no wheezes.   Musculoskeletal: Normal range of motion.  Neurological: She is alert and oriented to person, place, and time.  Skin: Skin is warm and dry.  Psychiatric: She has a normal mood and affect. Her behavior is normal.    ED Course  Procedures (including critical care time) DIAGNOSTIC STUDIES: Oxygen Saturation is 100% on RA, normal by my interpretation.    COORDINATION OF CARE: 6:53 PM- Pt advised of plan for treatment including refilling asthma med, sleeping med, and no refills for the narcotic medications. Pt expresses understandind and agrees.      Labs Review Labs Reviewed - No data to display Imaging Review No results found.  MDM   1. Encounter for medication refill    BP 111/87  Pulse 101  Temp(Src) 98.4 F (36.9 C) (Oral)  Resp 16  SpO2 100%  I personally performed the services described in this documentation, which was scribed in my presence. The recorded information has been reviewed and is accurate.     Fayrene Helper, PA-C 06/12/13 1856

## 2013-06-12 NOTE — ED Notes (Signed)
The  Pt nedss 4-5 meds refilled.  Her doctor cannot see her until next week.  She ran out of these meds today

## 2013-06-13 NOTE — ED Provider Notes (Signed)
Medical screening examination/treatment/procedure(s) were performed by non-physician practitioner and as supervising physician I was immediately available for consultation/collaboration.   Gwyneth Sprout, MD 06/13/13 [phone]

## 2013-06-19 ENCOUNTER — Emergency Department (HOSPITAL_COMMUNITY): Payer: Self-pay

## 2013-06-19 ENCOUNTER — Emergency Department (HOSPITAL_COMMUNITY)
Admission: EM | Admit: 2013-06-19 | Discharge: 2013-06-19 | Disposition: A | Discharge status: Home / Self Care | Payer: Self-pay | Attending: Emergency Medicine | Admitting: Emergency Medicine

## 2013-06-19 ENCOUNTER — Encounter (HOSPITAL_COMMUNITY): Payer: Self-pay | Admitting: Family Medicine

## 2013-06-19 DIAGNOSIS — J45909 Unspecified asthma, uncomplicated: Secondary | ICD-10-CM | POA: Insufficient documentation

## 2013-06-19 DIAGNOSIS — Z880 Allergy status to penicillin: Secondary | ICD-10-CM | POA: Insufficient documentation

## 2013-06-19 DIAGNOSIS — F329 Major depressive disorder, single episode, unspecified: Secondary | ICD-10-CM | POA: Insufficient documentation

## 2013-06-19 DIAGNOSIS — R42 Dizziness and giddiness: Secondary | ICD-10-CM | POA: Insufficient documentation

## 2013-06-19 DIAGNOSIS — Z87891 Personal history of nicotine dependence: Secondary | ICD-10-CM | POA: Insufficient documentation

## 2013-06-19 DIAGNOSIS — M25519 Pain in unspecified shoulder: Secondary | ICD-10-CM | POA: Insufficient documentation

## 2013-06-19 DIAGNOSIS — Z8742 Personal history of other diseases of the female genital tract: Secondary | ICD-10-CM | POA: Insufficient documentation

## 2013-06-19 DIAGNOSIS — Z862 Personal history of diseases of the blood and blood-forming organs and certain disorders involving the immune mechanism: Secondary | ICD-10-CM | POA: Insufficient documentation

## 2013-06-19 DIAGNOSIS — Z79899 Other long term (current) drug therapy: Secondary | ICD-10-CM | POA: Insufficient documentation

## 2013-06-19 DIAGNOSIS — M79602 Pain in left arm: Secondary | ICD-10-CM

## 2013-06-19 DIAGNOSIS — F3289 Other specified depressive episodes: Secondary | ICD-10-CM | POA: Insufficient documentation

## 2013-06-19 DIAGNOSIS — IMO0002 Reserved for concepts with insufficient information to code with codable children: Secondary | ICD-10-CM | POA: Insufficient documentation

## 2013-06-19 LAB — CBC
HCT: 34.4 % — ABNORMAL LOW (ref 36.0–46.0)
RDW: 12.9 % (ref 11.5–15.5)
WBC: 6.1 10*3/uL (ref 4.0–10.5)

## 2013-06-19 LAB — BASIC METABOLIC PANEL
BUN: 8 mg/dL (ref 6–23)
Chloride: 103 mEq/L (ref 96–112)
GFR calc Af Amer: >90 mL/min (ref >90)
GFR calc non Af Amer: >90 mL/min (ref >90)
Potassium: 3.9 mEq/L (ref 3.5–5.1)
Sodium: 138 mEq/L (ref 135–145)

## 2013-06-19 LAB — POCT I-STAT TROPONIN I: Troponin i, poc: 0 ng/mL (ref 0.00–0.08)

## 2013-06-19 MED ORDER — MECLIZINE HCL 50 MG PO TABS: 50.0000 mg | ORAL_TABLET | Freq: Three times a day (TID) | ORAL | Status: DC | PRN

## 2013-06-19 MED ORDER — OXYCODONE-ACETAMINOPHEN 5-325 MG PO TABS: 2.0000 | ORAL_TABLET | ORAL | Status: DC | PRN

## 2013-06-19 MED ORDER — KETOROLAC TROMETHAMINE 60 MG/2ML IM SOLN
60.0000 mg | Freq: Once | INTRAMUSCULAR | Status: AC
  Administered 2013-06-19: 60 mg via INTRAMUSCULAR
  Filled 2013-06-19: qty 2

## 2013-06-19 MED ORDER — OXYCODONE-ACETAMINOPHEN 5-325 MG PO TABS
2.0000 | ORAL_TABLET | Freq: Once | ORAL | Status: AC
  Administered 2013-06-19: 2 via ORAL
  Filled 2013-06-19: qty 2

## 2013-06-19 MED ORDER — NAPROXEN 500 MG PO TABS: 500.0000 mg | ORAL_TABLET | Freq: Two times a day (BID) | ORAL | Status: DC

## 2013-06-19 NOTE — ED Provider Notes (Signed)
CSN: [id number]     Arrival date & time 06/19/13  1050 History   First MD Initiated Contact with Patient 06/19/13 1122     Chief Complaint  Patient presents with  . Arm Pain  . Dizziness   (Consider location/radiation/quality/duration/timing/severity/associated sxs/prior Treatment) HPI Comments: Pt with frequent visits to the ED for L shoulder pain - the pain is in the L shoulder, m id arm and radiates to the back and then neck.  She awoke with vertigo this AM - this is a constant vertigo that is mild but gets worse with moving her head around.  No other visual field changes.  Sx are recurrent, she notes that every time she gets her left shoulder pain she also gets the vertigo feeling. She also reports that she has had a 60 pound weight gain but states that she was 135 pounds 4 months ago at her doctor's visit, she is unsure how much he weighs today. She denies any focal weakness of her all 4 extremities but does state that her left arm feels weak because of the pain. Again these are not new symptoms they seemed to be recurrent, worse with range of motion of the shoulder, no other acute injuries.  Patient is a 42 y.o. female presenting with arm pain. The history is provided by the patient.  Arm Pain    Past Medical History  Diagnosis Date  . Allergy     pollen, pcn  . Asthma   . Fibroids   . History of ovarian cyst   . Sickle cell anemia     has the trait  . Menometrorrhagia   . Sickle cell trait   . Depression   . Medical history non-contributory     sickle cell   Past Surgical History  Procedure Laterality Date  . Cesarean section      all three pregnancies   Family History  Problem Relation Age of Onset  . Cancer Mother     colon cancer  . Hypertension Mother   . Heart disease Father   . Cancer Father     cancer  . Other Neg Hx    History  Substance Use Topics  . Smoking status: Former Smoker -- 0.20 packs/day    Types: Cigarettes  . Smokeless tobacco: Never Used      Comment: cessation info given  . Alcohol Use: No   OB History   Grav Para Term Preterm Abortions TAB SAB Ect Mult Living   3 3 0       3     Review of Systems  All other systems reviewed and are negative.    Allergies  Bupropion hcl er (sr); Fruit & vegetable daily; Penicillins; Pollen extract-tree extract; and Other  Home Medications   Current Outpatient Rx  Name  Route  Sig  Dispense  Refill  . albuterol (PROVENTIL HFA;VENTOLIN HFA) 108 (90 BASE) MCG/ACT inhaler   Inhalation   Inhale 2 puffs into the lungs every 4 (four) hours as needed for wheezing or shortness of breath.   1 Inhaler   0   . cyclobenzaprine (FLEXERIL) 10 MG tablet   Oral   Take 1 tablet (10 mg total) by mouth 2 (two) times daily as needed for muscle spasms.   20 tablet   0   . diazepam (VALIUM) 5 MG tablet   Oral   Take 0.5 tablets (2.5 mg total) by mouth every 6 (six) hours as needed for anxiety.   12 tablet  0   . FLUoxetine (PROZAC) 20 MG capsule   Oral   Take 1 capsule (20 mg total) by mouth daily.   30 capsule   0   . Fluticasone-Salmeterol (ADVAIR) 500-50 MCG/DOSE AEPB   Inhalation   Inhale 1 puff into the lungs every 12 (twelve) hours.   60 each   0   . HYDROcodone-acetaminophen (NORCO) 5-325 MG per tablet   Oral   Take 1 tablet by mouth every 6 (six) hours as needed for pain.   7 tablet   0   . ibuprofen (ADVIL,MOTRIN) 200 MG tablet   Oral   Take 400 mg by mouth every 6 (six) hours as needed for pain.         Marland Kitchen levocetirizine (XYZAL) 5 MG tablet   Oral   Take 1 tablet (5 mg total) by mouth every evening.   30 tablet   0   . traZODone (DESYREL) 50 MG tablet   Oral   Take 1 tablet (50 mg total) by mouth at bedtime as needed for sleep.   30 tablet   0   . meclizine (ANTIVERT) 50 MG tablet   Oral   Take 1 tablet (50 mg total) by mouth 3 (three) times daily as needed.   30 tablet   0   . naproxen (NAPROSYN) 500 MG tablet   Oral   Take 1 tablet (500 mg  total) by mouth 2 (two) times daily with a meal.   30 tablet   0   . oxyCODONE-acetaminophen (PERCOCET/ROXICET) 5-325 MG per tablet   Oral   Take 2 tablets by mouth every 4 (four) hours as needed for pain.   6 tablet   0    BP 118/76  Pulse 73  Temp(Src) 98.4 F (36.9 C)  Resp 18  Wt 130 lb 8 oz (59.194 kg)  BMI 24.67 kg/m2  SpO2 99%  LMP 06/04/2013 Physical Exam  Nursing note and vitals reviewed. Constitutional: She appears well-developed and well-nourished. No distress.  HENT:  Head: Normocephalic and atraumatic.  Mouth/Throat: Oropharynx is clear and moist. No oropharyngeal exudate.  Oropharynx clear, tympanic membranes visualized and appear normal  Eyes: Conjunctivae and EOM are normal. Pupils are equal, round, and reactive to light. Right eye exhibits no discharge. Left eye exhibits no discharge. No scleral icterus.  Neck: Normal range of motion. Neck supple. No JVD present. No thyromegaly present.  Cardiovascular: Normal rate, regular rhythm, normal heart sounds and intact distal pulses.  Exam reveals no gallop and no friction rub.   No murmur heard. Pulmonary/Chest: Effort normal and breath sounds normal. No respiratory distress. She has no wheezes. She has no rales.  Abdominal: Soft. Bowel sounds are normal. She exhibits no distension and no mass. There is no tenderness.  Musculoskeletal: She exhibits tenderness ( ttp over the L shoulder girdle and the prox humerus - any ROM of the L shoulder causes pain in the shoulder - ROM of the hand and wrist as well.  ). She exhibits no edema.  Lymphadenopathy:    She has no cervical adenopathy.  Neurological: She is alert. Coordination normal.  The patient has no nystagmus, normal coordination, normal speech, cranial nerves III through XII. Normal sensation to light touch and pinprick, normal strength at the left upper extremity including shoulder elbow and wrist when she is able to tolerate the pain. This appears to be effort  driven  Skin: Skin is warm and dry. No rash noted. No erythema.  No redness  over the L shoulder.  Psychiatric: She has a normal mood and affect. Her behavior is normal.    ED Course  Procedures (including critical care time) Labs Review Labs Reviewed  CBC - Abnormal; Notable for the following:    HCT 34.4 (*)    MCV 77.0 (*)    MCHC 36.9 (*)    All other components within normal limits  BASIC METABOLIC PANEL  POCT I-STAT TROPONIN I   Imaging Review Ct Head Wo Contrast  06/19/2013   CLINICAL DATA:  Severe headache, dizziness, left arm pain, nausea for 4 days, history sickle cell trait, asthma, smoking  EXAM: CT HEAD WITHOUT CONTRAST  TECHNIQUE: Contiguous axial images were obtained from the base of the skull through the vertex without intravenous contrast.  COMPARISON:  01/11/2012  FINDINGS: Normal ventricular morphology.  No midline shift or mass effect.  Normal appearance of brain parenchyma.  No intracranial hemorrhage, mass lesion, or acute infarction.  Visualized paranasal sinuses and mastoid air cells clear.  Bones unremarkable.  IMPRESSION: No acute intracranial abnormalities.   Electronically Signed   By: Ulyses Southward M.D.   On: 06/19/2013 12:57    MDM   1. Arm pain, left   2. Dizziness    The patient is having focal pain to her left shoulder, this is recurrent syndrome for her, her vital signs are normal, her EKG is normal, she is claiming to have a large amount of weight loss but she does not appear to be 60 pounds lighter than her prior weight which she states was 135 pounds. We'll check her weight, her apparent central vertigo with peripheral vertigo features such as worse with movement of the head is not readily apparent on her exam, CT scan of the head to further evaluate.  Again she states that this happens every time her shoulder hurts.  ED ECG REPORT  I personally interpreted this EKG   Date: 06/19/2013   Rate: 90  Rhythm: normal sinus rhythm  QRS Axis: normal   Intervals: normal  ST/T Wave abnormalities: normal  Conduction Disutrbances:none  Narrative Interpretation:   Old EKG Reviewed: Compared with 04/20/2013, no significant changes are seen   CT scan negative, labs unremarkable, no significant weight loss, the patient is stable for discharge.   Meds given in ED:  Medications  oxyCODONE-acetaminophen (PERCOCET/ROXICET) 5-325 MG per tablet 2 tablet (2 tablets Oral Given 06/19/13 1410)  ketorolac (TORADOL) injection 60 mg (60 mg Intramuscular Given 06/19/13 1411)    New Prescriptions   MECLIZINE (ANTIVERT) 50 MG TABLET    Take 1 tablet (50 mg total) by mouth 3 (three) times daily as needed.   NAPROXEN (NAPROSYN) 500 MG TABLET    Take 1 tablet (500 mg total) by mouth 2 (two) times daily with a meal.   OXYCODONE-ACETAMINOPHEN (PERCOCET/ROXICET) 5-325 MG PER TABLET    Take 2 tablets by mouth every 4 (four) hours as needed for pain.      Vida Roller, MD 06/19/13 [phone]

## 2013-06-19 NOTE — ED Notes (Signed)
Patient transported to CT 

## 2013-06-19 NOTE — ED Notes (Addendum)
Per pt since last night she has had sharp stabbing left arm pain from elbow to shoulder and worse this am. sts also dizziness that started last night. sts some SOB. sts is more painful with movement.

## 2013-06-19 NOTE — ED Notes (Signed)
EKG given to Dr. Hyacinth Meeker. Asked not to draw blood at this time.

## 2013-06-19 NOTE — ED Notes (Signed)
Pt return from CT.

## 2013-06-19 NOTE — ED Notes (Signed)
MD at bedside. 

## 2013-07-30 ENCOUNTER — Encounter (HOSPITAL_COMMUNITY): Payer: Self-pay | Admitting: Emergency Medicine

## 2013-07-30 ENCOUNTER — Emergency Department (HOSPITAL_COMMUNITY)
Admission: EM | Admit: 2013-07-30 | Discharge: 2013-07-30 | Disposition: A | Discharge status: Home / Self Care | Payer: Self-pay | Attending: Emergency Medicine | Admitting: Emergency Medicine

## 2013-07-30 DIAGNOSIS — Z862 Personal history of diseases of the blood and blood-forming organs and certain disorders involving the immune mechanism: Secondary | ICD-10-CM | POA: Insufficient documentation

## 2013-07-30 DIAGNOSIS — F3289 Other specified depressive episodes: Secondary | ICD-10-CM | POA: Insufficient documentation

## 2013-07-30 DIAGNOSIS — Z791 Long term (current) use of non-steroidal anti-inflammatories (NSAID): Secondary | ICD-10-CM | POA: Insufficient documentation

## 2013-07-30 DIAGNOSIS — Z87891 Personal history of nicotine dependence: Secondary | ICD-10-CM | POA: Insufficient documentation

## 2013-07-30 DIAGNOSIS — Z760 Encounter for issue of repeat prescription: Secondary | ICD-10-CM | POA: Insufficient documentation

## 2013-07-30 DIAGNOSIS — J45909 Unspecified asthma, uncomplicated: Secondary | ICD-10-CM | POA: Insufficient documentation

## 2013-07-30 DIAGNOSIS — Z79899 Other long term (current) drug therapy: Secondary | ICD-10-CM | POA: Insufficient documentation

## 2013-07-30 DIAGNOSIS — Z880 Allergy status to penicillin: Secondary | ICD-10-CM | POA: Insufficient documentation

## 2013-07-30 DIAGNOSIS — Z8742 Personal history of other diseases of the female genital tract: Secondary | ICD-10-CM | POA: Insufficient documentation

## 2013-07-30 DIAGNOSIS — F329 Major depressive disorder, single episode, unspecified: Secondary | ICD-10-CM | POA: Insufficient documentation

## 2013-07-30 MED ORDER — FLUOXETINE HCL 20 MG PO CAPS: 20.0000 mg | ORAL_CAPSULE | Freq: Every day | ORAL | Status: DC

## 2013-07-30 NOTE — ED Notes (Signed)
Pt ran out prozac 2 weeks ago and would like a refill.  Denies SI or HI at this time.

## 2013-07-30 NOTE — ED Provider Notes (Signed)
CSN: [id number]     Arrival date & time 07/30/13  1326 History  This chart was scribed for non-physician practitioner working with Audree Camel, MD by Ashley Jacobs, ED scribe. This patient was seen in room TR05C/TR05C and the patient's care was started at [facility] PM.   First MD Initiated Contact with Patient 07/30/13 1434     Chief Complaint  Patient presents with  . Medication Refill   (Consider location/radiation/quality/duration/timing/severity/associated sxs/prior Treatment) The history is provided by the patient and medical records. No language interpreter was used.   HPI Comments: [name] is a 42 y.o. female with a hx of depression presents to the Emergency Department for a medication refill. Pt reports she ran out of Prozac 20 mg two weeks ago and she is unble to obtain another prescription due to change in medical provider. Last week she mentions she lost her father and is feeling more depressed.  She denies SI and HI. Pt has been taking Prozac for the past 5 years. She has allergies to Bupropion HCL ER, fruits and vegetables, penicillins, and pollen extract. Pt has a past medical hx of sickle cell anemia. She is a former smoker and does not use alcohol or drugs. Past Medical History  Diagnosis Date  . Allergy     pollen, pcn  . Asthma   . Fibroids   . History of ovarian cyst   . Sickle cell anemia     has the trait  . Menometrorrhagia   . Sickle cell trait   . Depression   . Medical history non-contributory     sickle cell   Past Surgical History  Procedure Laterality Date  . Cesarean section      all three pregnancies   Family History  Problem Relation Age of Onset  . Cancer Mother     colon cancer  . Hypertension Mother   . Heart disease Father   . Cancer Father     cancer  . Other Neg Hx    History  Substance Use Topics  . Smoking status: Former Smoker -- 0.20 packs/day    Types: Cigarettes    Quit date: 07/30/2012  . Smokeless tobacco: Never  Used     Comment: cessation info given  . Alcohol Use: No   OB History   Grav Para Term Preterm Abortions TAB SAB Ect Mult Living   3 3 0       3     Review of Systems  Constitutional: Positive for activity change.  Psychiatric/Behavioral: Negative for suicidal ideas.    Allergies  Bupropion hcl er (sr); Fruit & vegetable daily; Penicillins; Pollen extract-tree extract; and Other  Home Medications   Current Outpatient Rx  Name  Route  Sig  Dispense  Refill  . albuterol (PROVENTIL HFA;VENTOLIN HFA) 108 (90 BASE) MCG/ACT inhaler   Inhalation   Inhale 2 puffs into the lungs daily as needed for shortness of breath.         . diazepam (VALIUM) 5 MG tablet   Oral   Take 5 mg by mouth every 6 (six) hours as needed for anxiety.         Marland Kitchen FLUoxetine (PROZAC) 20 MG capsule   Oral   Take 1 capsule (20 mg total) by mouth daily.   30 capsule   0   . Fluticasone-Salmeterol (ADVAIR) 500-50 MCG/DOSE AEPB   Inhalation   Inhale 1 puff into the lungs every 12 (twelve) hours.   60 each  0   . HYDROcodone-acetaminophen (NORCO) 5-325 MG per tablet   Oral   Take 1 tablet by mouth every 6 (six) hours as needed for pain.   7 tablet   0   . levocetirizine (XYZAL) 5 MG tablet   Oral   Take 1 tablet (5 mg total) by mouth every evening.   30 tablet   0   . meclizine (ANTIVERT) 50 MG tablet   Oral   Take 50 mg by mouth 3 (three) times daily as needed.         . naproxen (NAPROSYN) 500 MG tablet   Oral   Take 500 mg by mouth 2 (two) times daily with a meal.         . oxyCODONE-acetaminophen (PERCOCET/ROXICET) 5-325 MG per tablet   Oral   Take 1 tablet by mouth every 6 (six) hours as needed for moderate pain.         . traZODone (DESYREL) 50 MG tablet   Oral   Take 1 tablet (50 mg total) by mouth at bedtime as needed for sleep.   30 tablet   0    BP 113/61  Pulse 77  Temp(Src) 98.4 F (36.9 C) (Oral)  Resp 20  Ht 5\' 1"  (1.549 m)  Wt 139 lb 12.8 oz (63.413 kg)   BMI 26.43 kg/m2  SpO2 99%  LMP 07/21/2013 Physical Exam  Nursing note and vitals reviewed. Constitutional: She is oriented to person, place, and time. She appears well-developed and well-nourished. No distress.  HENT:  Head: Normocephalic and atraumatic.  Eyes: EOM are normal. Pupils are equal, round, and reactive to light.  Neck: Normal range of motion. Neck supple. No tracheal deviation present.  Cardiovascular: Normal rate.   Pulmonary/Chest: Effort normal. No respiratory distress.  Abdominal: Soft. She exhibits no distension.  Musculoskeletal: Normal range of motion.  Neurological: She is alert and oriented to person, place, and time.  Skin: Skin is warm and dry.  Psychiatric: She has a normal mood and affect. Her behavior is normal.    ED Course  Procedures (including critical care time) DIAGNOSTIC STUDIES: Oxygen Saturation is 99% on room air, normal by my interpretation.    COORDINATION OF CARE: 3:25 PM Discussed course of care with pt . Pt understands and agrees.    Labs Review Labs Reviewed - No data to display Imaging Review No results found.  EKG Interpretation   None       MDM   1. Encounter for medication refill    I personally performed the services described in this documentation, which was scribed in my presence. The recorded information has been reviewed and is accurate.        Fayrene Helper, PA-C 07/30/13 1547

## 2013-07-30 NOTE — ED Notes (Signed)
Former pt of Dr. Eula Listen who is no longer practicing in [place]. Has not yet found another PCP. Needs refill on Prozac.

## 2013-07-30 NOTE — Discharge Planning (Signed)
[name] Community Liaison  Patient was given the orange card application during last ED visit. Patient states she still has the application but has been dealing with life issues which has prevented her from moving forward in the process of obtaining her orange card. Primary resource guide was given as well as my contact information.

## 2013-07-30 NOTE — ED Provider Notes (Signed)
Medical screening examination/treatment/procedure(s) were performed by non-physician practitioner and as supervising physician I was immediately available for consultation/collaboration.   Charles B. Sheldon, MD 07/30/13 2050 

## 2014-07-19 ENCOUNTER — Encounter (HOSPITAL_COMMUNITY): Payer: Self-pay | Admitting: Emergency Medicine

## 2014-07-20 ENCOUNTER — Encounter (HOSPITAL_COMMUNITY): Payer: Self-pay | Admitting: Emergency Medicine

## 2014-07-20 ENCOUNTER — Emergency Department (HOSPITAL_COMMUNITY)
Admission: EM | Admit: 2014-07-20 | Discharge: 2014-07-20 | Disposition: A | Discharge status: Home / Self Care | Payer: Self-pay | Attending: Emergency Medicine | Admitting: Emergency Medicine

## 2014-07-20 ENCOUNTER — Emergency Department (HOSPITAL_COMMUNITY): Payer: Self-pay

## 2014-07-20 DIAGNOSIS — R05 Cough: Secondary | ICD-10-CM

## 2014-07-20 DIAGNOSIS — Z880 Allergy status to penicillin: Secondary | ICD-10-CM | POA: Insufficient documentation

## 2014-07-20 DIAGNOSIS — Z79899 Other long term (current) drug therapy: Secondary | ICD-10-CM | POA: Insufficient documentation

## 2014-07-20 DIAGNOSIS — Z87891 Personal history of nicotine dependence: Secondary | ICD-10-CM | POA: Insufficient documentation

## 2014-07-20 DIAGNOSIS — M79662 Pain in left lower leg: Secondary | ICD-10-CM | POA: Insufficient documentation

## 2014-07-20 DIAGNOSIS — J45901 Unspecified asthma with (acute) exacerbation: Secondary | ICD-10-CM

## 2014-07-20 DIAGNOSIS — F329 Major depressive disorder, single episode, unspecified: Secondary | ICD-10-CM | POA: Insufficient documentation

## 2014-07-20 DIAGNOSIS — Z8742 Personal history of other diseases of the female genital tract: Secondary | ICD-10-CM | POA: Insufficient documentation

## 2014-07-20 DIAGNOSIS — Z791 Long term (current) use of non-steroidal anti-inflammatories (NSAID): Secondary | ICD-10-CM | POA: Insufficient documentation

## 2014-07-20 DIAGNOSIS — R059 Cough, unspecified: Secondary | ICD-10-CM

## 2014-07-20 DIAGNOSIS — M898X6 Other specified disorders of bone, lower leg: Secondary | ICD-10-CM

## 2014-07-20 DIAGNOSIS — Z862 Personal history of diseases of the blood and blood-forming organs and certain disorders involving the immune mechanism: Secondary | ICD-10-CM | POA: Insufficient documentation

## 2014-07-20 DIAGNOSIS — Z7951 Long term (current) use of inhaled steroids: Secondary | ICD-10-CM | POA: Insufficient documentation

## 2014-07-20 MED ORDER — ALBUTEROL (5 MG/ML) CONTINUOUS INHALATION SOLN
10.0000 mg/h | INHALATION_SOLUTION | Freq: Once | RESPIRATORY_TRACT | Status: AC
  Administered 2014-07-20: 10 mg/h via RESPIRATORY_TRACT

## 2014-07-20 MED ORDER — PREDNISONE 10 MG PO TABS: 40.0000 mg | ORAL_TABLET | Freq: Every day | ORAL | Status: DC

## 2014-07-20 MED ORDER — ALBUTEROL (5 MG/ML) CONTINUOUS INHALATION SOLN
5.0000 mg/h | INHALATION_SOLUTION | Freq: Once | RESPIRATORY_TRACT | Status: DC
  Filled 2014-07-20: qty 20

## 2014-07-20 MED ORDER — METHYLPREDNISOLONE SODIUM SUCC 125 MG IJ SOLR
125.0000 mg | Freq: Once | INTRAMUSCULAR | Status: AC
  Administered 2014-07-20: 125 mg via INTRAVENOUS
  Filled 2014-07-20: qty 2

## 2014-07-20 MED ORDER — ALBUTEROL SULFATE HFA 108 (90 BASE) MCG/ACT IN AERS: 2.0000 | INHALATION_SPRAY | RESPIRATORY_TRACT | Status: DC | PRN

## 2014-07-20 MED ORDER — SODIUM CHLORIDE 0.9 % IV BOLUS (SEPSIS)
1000.0000 mL | Freq: Once | INTRAVENOUS | Status: AC
  Administered 2014-07-20: 1000 mL via INTRAVENOUS

## 2014-07-20 MED ORDER — IPRATROPIUM-ALBUTEROL 0.5-2.5 (3) MG/3ML IN SOLN: 3.0000 mL | RESPIRATORY_TRACT | Status: DC

## 2014-07-20 NOTE — ED Notes (Signed)
Patient was given a cup of coffee and a blanket.

## 2014-07-20 NOTE — ED Notes (Signed)
Respiratory called about continuous neb

## 2014-07-20 NOTE — Discharge Instructions (Signed)
Asthma Asthma is a recurring condition in which the airways tighten and narrow. Asthma can make it difficult to breathe. It can cause coughing, wheezing, and shortness of breath. Asthma episodes, also called asthma attacks, range from minor to life-threatening. Asthma cannot be cured, but medicines and lifestyle changes can help control it. CAUSES Asthma is believed to be caused by inherited (genetic) and environmental factors, but its exact cause is unknown. Asthma may be triggered by allergens, lung infections, or irritants in the air. Asthma triggers are different for each person. Common triggers include:   Animal dander.  Dust mites.  Cockroaches.  Pollen from trees or grass.  Mold.  Smoke.  Air pollutants such as dust, household cleaners, hair sprays, aerosol sprays, paint fumes, strong chemicals, or strong odors.  Cold air, weather changes, and winds (which increase molds and pollens in the air).  Strong emotional expressions such as crying or laughing hard.  Stress.  Certain medicines (such as aspirin) or types of drugs (such as beta-blockers).  Sulfites in foods and drinks. Foods and drinks that may contain sulfites include dried fruit, potato chips, and sparkling grape juice.  Infections or inflammatory conditions such as the flu, a cold, or an inflammation of the nasal membranes (rhinitis).  Gastroesophageal reflux disease (GERD).  Exercise or strenuous activity. SYMPTOMS Symptoms may occur immediately after asthma is triggered or many hours later. Symptoms include:  Wheezing.  Excessive nighttime or early morning coughing.  Frequent or severe coughing with a common cold.  Chest tightness.  Shortness of breath. DIAGNOSIS  The diagnosis of asthma is made by a review of your medical history and a physical exam. Tests may also be performed. These may include:  Lung function studies. These tests show how much air you breathe in and out.  Allergy  tests.  Imaging tests such as X-rays. TREATMENT  Asthma cannot be cured, but it can usually be controlled. Treatment involves identifying and avoiding your asthma triggers. It also involves medicines. There are 2 classes of medicine used for asthma treatment:   Controller medicines. These prevent asthma symptoms from occurring. They are usually taken every day.  Reliever or rescue medicines. These quickly relieve asthma symptoms. They are used as needed and provide short-term relief. Your health care provider will help you create an asthma action plan. An asthma action plan is a written plan for managing and treating your asthma attacks. It includes a list of your asthma triggers and how they may be avoided. It also includes information on when medicines should be taken and when their dosage should be changed. An action plan may also involve the use of a device called a peak flow meter. A peak flow meter measures how well the lungs are working. It helps you monitor your condition. HOME CARE INSTRUCTIONS   Take medicines only as directed by your health care provider. Speak with your health care provider if you have questions about how or when to take the medicines.  Use a peak flow meter as directed by your health care provider. Record and keep track of readings.  Understand and use the action plan to help minimize or stop an asthma attack without needing to seek medical care.  Control your home environment in the following ways to help prevent asthma attacks:  Do not smoke. Avoid being exposed to secondhand smoke.  Change your heating and air conditioning filter regularly.  Limit your use of fireplaces and wood stoves.  Get rid of pests (such as roaches and  mice) and their droppings.  Throw away plants if you see mold on them.  Clean your floors and dust regularly. Use unscented cleaning products.  Try to have someone else vacuum for you regularly. Stay out of rooms while they are  being vacuumed and for a short while afterward. If you vacuum, use a dust mask from a hardware store, a double-layered or microfilter vacuum cleaner bag, or a vacuum cleaner with a HEPA filter.  Replace carpet with wood, tile, or vinyl flooring. Carpet can trap dander and dust.  Use allergy-proof pillows, mattress covers, and box spring covers.  Wash bed sheets and blankets every week in hot water and dry them in a dryer.  Use blankets that are made of polyester or cotton.  Clean bathrooms and kitchens with bleach. If possible, have someone repaint the walls in these rooms with mold-resistant paint. Keep out of the rooms that are being cleaned and painted.  Wash hands frequently. SEEK MEDICAL CARE IF:   You have wheezing, shortness of breath, or a cough even if taking medicine to prevent attacks.  The colored mucus you cough up (sputum) is thicker than usual.  Your sputum changes from clear or white to yellow, green, gray, or bloody.  You have any problems that may be related to the medicines you are taking (such as a rash, itching, swelling, or trouble breathing).  You are using a reliever medicine more than 2-3 times per week.  Your peak flow is still at 50-79% of your personal best after following your action plan for 1 hour.  You have a fever. SEEK IMMEDIATE MEDICAL CARE IF:   You seem to be getting worse and are unresponsive to treatment during an asthma attack.  You are short of breath even at rest.  You get short of breath when doing very little physical activity.  You have difficulty eating, drinking, or talking due to asthma symptoms.  You develop chest pain.  You develop a fast heartbeat.  You have a bluish color to your lips or fingernails.  You are light-headed, dizzy, or faint.  Your peak flow is less than 50% of your personal best. MAKE SURE YOU:   Understand these instructions.  Will watch your condition.  Will get help right away if you are not  doing well or get worse. Document Released: 09/03/2005 Document Revised: 01/18/2014 Document Reviewed: 04/02/2013 [facility] Patient Information 2015 [place], [place]. This information is not intended to replace advice given to you by your health care provider. Make sure you discuss any questions you have with your health care provider.    Emergency Department Resource Guide 1) Find a Doctor and Pay Out of Pocket Although you won't have to find out who is covered by your insurance plan, it is a good idea to ask around and get recommendations. You will then need to call the office and see if the doctor you have chosen will accept you as a new patient and what types of options they offer for patients who are self-pay. Some doctors offer discounts or will set up payment plans for their patients who do not have insurance, but you will need to ask so you aren't surprised when you get to your appointment.  2) Contact Your Local Health Department Not all health departments have doctors that can see patients for sick visits, but many do, so it is worth a call to see if yours does. If you don't know where your local health department is, you can check  in your phone book. The CDC also has a tool to help you locate your state's health department, and many state websites also have listings of all of their local health departments.  3) Find a [facility] If your illness is not likely to be very severe or complicated, you may want to try a walk in clinic. These are popping up all over the country in pharmacies, drugstores, and shopping centers. They're usually staffed by nurse practitioners or physician assistants that have been trained to treat common illnesses and complaints. They're usually fairly quick and inexpensive. However, if you have serious medical issues or chronic medical problems, these are probably not your best option.  No Primary Care Doctor: - Call Health Connect at  [phone] - they can help you  locate a primary care doctor that  accepts your insurance, provides certain services, etc. - Physician Referral Service- [phone]  Chronic Pain Problems: Organization         Address  Phone   Notes  [facility]  [phone] Patients need to be referred by their primary care doctor.   Medication Assistance: Organization         Address  Phone   Notes  [facility] Medication [facility] [address] [place], [place] [address] [phone] --Must be a resident of [facility] -- Must have NO insurance coverage whatsoever (no Medicaid/ Medicare, etc.) -- The pt. MUST have a primary care doctor that directs their care regularly and follows them in the community   MedAssist  [phone]   [employer]  [phone]    Agencies that provide inexpensive medical care: Organization         Address  Phone   Notes  [place]  [phone]   [facility]    [phone]   [facility] [place], [place] [address] [phone]   [place] [address], [place] [phone]   Planned Parenthood    [phone]   [facility]    [phone]   [place] and [place], [place] Phone:  [phone], Fax:  [phone] Hours of Operation:  9 am - 6 pm, M-F.  Also accepts Medicaid/Medicare and self-pay.  [facility] for [place], [address], [place] Phone: [phone], Fax: [phone]. Hours of Operation:  8:30 am - 5:30 pm, M-F.  Also accepts Medicaid and self-pay.  [facility] [place] [address] [place] Phone: [phone]   [place], [place], [place] [phone], Ext. [phone] Mondays & Thursdays: 7-9 AM.  First 15 patients are seen on a first come, first serve basis.    [place]  Providers:  Organization         Address  Phone   Notes  [facility] [address], [place] [phone] Also accepts self-pay patients.  [facility] [address], [place]  [phone]   [place], [address], [place] [phone]   [facility] [address] [place] [phone]   Lucianne Lei [address], [place]   [phone] Only accepts [place] Access [place] patients after they have their name applied to their  card.   Self-Pay (no insurance) in [facility]:  Organization         Address  Phone   Notes  Sickle Cell Patients, [facility] [place] [phone]   [facility] [place] [phone]   [facility] [place]  [place], [address],  [phone]   [facility]/Dr. Osei-Bonsu  [address] [place] or [address], [place] [phone] Phone number for both [place] and [place] locations is the same.  [facility] [address] [place] [phone]   [facility] [address] [place] or [address] [phone]   [facility] [address], [place] [phone], phone; [phone], fax Sees patients 1st and 3rd Saturday of every month.  Must not qualify for public or private insurance (i.e. Medicaid, Medicare, [place] Health Choice, Veterans' Benefits)  Household income should be no more than 200% of the poverty level The clinic cannot treat you if you are pregnant or think you are pregnant  Sexually transmitted diseases are not treated at the clinic.    Dental Care: Organization         Address  Phone  Notes  [facility], [place] Department of [facility] [place] [phone] Accepts children up to age 52 who are enrolled in [place] or [place]; pregnant women with a Medicaid card; and children who have applied for Medicaid or [place] Health Choice, but were declined, whose parents can pay a reduced fee at time of service.  [facility] Department of [facility]  [address], [place] [phone] Accepts children up to age 82 who are enrolled in [place] or [place]; pregnant women with a Medicaid card; and children who have applied for Medicaid or [place] Health Choice, but were declined, whose parents can pay a reduced fee at time of service.  [place] Adult Dental Access PROGRAM  [place] [phone] Patients are seen by appointment only. Walk-ins are not accepted. [place] will see patients 49 years of age and older. Monday - Tuesday (8am-5pm) Most Wednesdays (8:30-5pm) $30 per visit, cash only  [facility] Adult Dental Access PROGRAM  [address], [facility] [phone] Patients are seen by appointment only. Walk-ins are not accepted. [place] will see patients 49 years of age and older. One Wednesday Evening (Monthly: Volunteer Based).  $30 per visit, cash only  [place]  [phone] for adults; Children under age 32, call Graduate Pediatric Dentistry at [phone]. Children aged 81-14, please call [phone] to request a pediatric application.  Dental services are provided in all areas of dental care including fillings, crowns and bridges, complete and partial dentures, implants, gum treatment, root canals, and extractions. Preventive care is also provided. Treatment is provided to both adults and children. Patients are selected via a lottery and there is often a waiting list.   [facility] [address] [place]  [phone] [email or web address]   [facility]  [address] [place], [place] [phone], Ext. [phone] Second and Fourth Thursday of each month, opens at 6:30 AM; Clinic ends at 9 AM.  Patients are seen on a first-come first-served basis, and  a limited number are seen during each clinic.   [facility]  [address] [place] Danker [place], [place] [phone]   Eligibility Requirements You must have lived in [place], [place], or [place] counties for at least the last three months.   You cannot be eligible for state or federal sponsored [employer], including [employer], [place], or [employer].   You generally cannot be eligible for healthcare insurance through your employer.    How to apply: Eligibility screenings are held every Tuesday and Wednesday afternoon from 1:00 pm until 4:00 pm. You do not need an appointment for the interview!  [facility] [address] [place], [place]   [place]  [place] Department  [place]  [phone]    Behavioral Health Resources in the Community: Intensive Outpatient Programs Organization         Address  Phone  Notes  [place]. [address] [place], [place] [phone]   [facility] Outpatient [address], [place], [place]   ADS: Alcohol & Drug Svcs [address] [place], [place]   [place] [address]  [place], [place] or [phone]   Substance Abuse Resources Organization         Address  Phone  Notes  Alcohol and Drug Services  [phone]   [place]  [phone]   [employer]   Chinita Pester  [phone]   Residential & Outpatient Substance Abuse Program  [phone]   Psychological Services Organization         Address  Phone  Notes  [facility] [place]  [place]  [phone]   [place] [address], [place] or [phone]    Mobile Crisis Teams Organization         Address  Phone  Notes  [facility], Mobile Crisis Care Unit  [phone]   [facility]  [address] [place], [place]   [place] [address] [place] [phone]    Self-Help/Support Groups Organization         Address  Phone             Notes  [place]. of [place] - variety of support groups  [place] Call for more information  Narcotics Anonymous (NA), [facility] [address], [employer]   2 meetings at this location   Special educational needs teacher         Address  Phone  Notes  ASAP Residential Treatment [place],    [place]  [phone]   [facility]  [address] [place] [phone], [place], [place]   [place], [place] [phone] Admissions: 8am-3pm M-F  Incentives Substance [place] [address]    [place], [place] [phone]   [facility] [address] Jadene Pierini [place], [place]   [facility] [address]  [place], [place]   [facility] - Intensive Outpatient [address] Kristeen [address], [place], [place]   [facility] ([place].,  [place], [place] or [phone]   [facility] Services (RTS) [address] [place], [place] Accepts Medicaid  Fellowship  [address]  [place] [phone] Substance Abuse/Addiction Treatment   [facility] Organization         Address  Phone  Notes  [facility]  [phone]   Domenic Schwab, PhD [address] Arlis Porta [place], [place]   [phone] or [phone]    [place], [place] [phone]   [address], [place], [place] [phone] Insurance/Medicaid/sponsorship through [facility] and Families [address] [place]                                    [place], [place] [phone] [place] [address] [place], [place] [phone]    Dr. Adele Schilder  [phone]   [facility] Dept. 1) [address], [place] 2) [place] 3)  [address], [place] [phone]  [phone]   [place] [phone] or [phone] (After Hours)

## 2014-07-20 NOTE — ED Provider Notes (Signed)
CSN: [id number]     Arrival date & time 07/20/14  0941 History   First MD Initiated Contact with Patient 07/20/14 [phone]     Chief Complaint  Patient presents with  . Asthma    Patient is a 43 y.o. female presenting with shortness of breath. The history is provided by the patient.  Shortness of Breath Severity:  Severe Onset quality:  Gradual Duration:  3 days Timing:  Constant Progression:  Worsening Chronicity:  Recurrent Context: weather changes   Context: not URI   Relieved by:  Nothing Worsened by:  Nothing tried Ineffective treatments:  Inhaler Associated symptoms: cough and wheezing   Associated symptoms: no abdominal pain, no chest pain, no fever, no rash and no vomiting   Cough:    Cough characteristics:  Non-productive   Severity:  Moderate   Onset quality:  Gradual   Duration:  1 day   Timing:  Intermittent   Progression:  Worsening   Chronicity:  New Wheezing:    Severity:  Severe   Onset quality:  Gradual   Duration:  3 days   Timing:  Constant   Progression:  Worsening   Chronicity:  Recurrent Risk factors comment:  Hx of asthma   Past Medical History  Diagnosis Date  . Allergy     pollen, pcn  . Asthma   . Fibroids   . History of ovarian cyst   . Sickle cell anemia     has the trait  . Menometrorrhagia   . Sickle cell trait   . Depression   . Medical history non-contributory     sickle cell   Past Surgical History  Procedure Laterality Date  . Cesarean section      all three pregnancies   Family History  Problem Relation Age of Onset  . Cancer Mother     colon cancer  . Hypertension Mother   . Heart disease Father   . Cancer Father     cancer  . Other Neg Hx    History  Substance Use Topics  . Smoking status: Former Smoker -- 0.20 packs/day    Types: Cigarettes    Quit date: 07/30/2012  . Smokeless tobacco: Never Used     Comment: cessation info given  . Alcohol Use: No   OB History    Gravida Para Term Preterm AB TAB SAB  Ectopic Multiple Living   3 3 0       3     Review of Systems  Constitutional: Negative for fever.  Respiratory: Positive for cough, chest tightness, shortness of breath and wheezing.   Cardiovascular: Negative for chest pain and leg swelling.  Gastrointestinal: Negative for vomiting and abdominal pain.  Musculoskeletal: Negative for gait problem.  Skin: Negative for rash.  All other systems reviewed and are negative.   Allergies  Bupropion hcl er (sr); Fruit & vegetable daily; Penicillins; Pollen extract-tree extract; and Other  Home Medications   Prior to Admission medications   Medication Sig Start Date End Date Taking? Authorizing Provider  albuterol (PROVENTIL HFA;VENTOLIN HFA) 108 (90 BASE) MCG/ACT inhaler Inhale 2 puffs into the lungs daily as needed for shortness of breath. 06/12/13   Domenic Moras, PA-C  diazepam (VALIUM) 5 MG tablet Take 5 mg by mouth every 6 (six) hours as needed for anxiety. 05/10/13   Garald Balding, NP  FLUoxetine (PROZAC) 20 MG capsule Take 1 capsule (20 mg total) by mouth daily. 07/30/13   Domenic Moras, PA-C  Fluticasone-Salmeterol (ADVAIR)  500-50 MCG/DOSE AEPB Inhale 1 puff into the lungs every 12 (twelve) hours. 06/12/13   Domenic Moras, PA-C  HYDROcodone-acetaminophen (NORCO) 5-325 MG per tablet Take 1 tablet by mouth every 6 (six) hours as needed for pain. 05/10/13   Garald Balding, NP  levocetirizine (XYZAL) 5 MG tablet Take 1 tablet (5 mg total) by mouth every evening. 06/12/13   Domenic Moras, PA-C  meclizine (ANTIVERT) 50 MG tablet Take 50 mg by mouth 3 (three) times daily as needed. 06/19/13   Johnna Acosta, MD  naproxen (NAPROSYN) 500 MG tablet Take 500 mg by mouth 2 (two) times daily with a meal. 06/19/13   Johnna Acosta, MD  oxyCODONE-acetaminophen (PERCOCET/ROXICET) 5-325 MG per tablet Take 1 tablet by mouth every 6 (six) hours as needed for moderate pain. 06/19/13   Johnna Acosta, MD  traZODone (DESYREL) 50 MG tablet Take 1 tablet (50 mg total) by mouth at  bedtime as needed for sleep. 06/12/13   Domenic Moras, PA-C   BP 110/88 mmHg  Pulse 78  Temp(Src) 98.2 F (36.8 C) (Oral)  Resp 15  Ht 5\' 1"  (1.549 m)  Wt 130 lb (58.968 kg)  BMI 24.58 kg/m2  SpO2 99%  LMP 07/04/2014   Physical Exam  Constitutional: She is oriented to person, place, and time. She appears well-developed and well-nourished. She is cooperative. No distress.  HENT:  Head: Normocephalic and atraumatic.  Right Ear: External ear normal.  Left Ear: External ear normal.  Neck: Normal range of motion and phonation normal.  Cardiovascular: Normal rate and regular rhythm.   Pulmonary/Chest: Tachypnea noted. No respiratory distress. She has decreased breath sounds. She has wheezes. She has no rales.  Abdominal: Soft. She exhibits no distension. There is no tenderness. There is no rebound and no guarding.  Musculoskeletal:       Legs: Neurological: She is alert and oriented to person, place, and time.  Skin: Skin is warm and dry. No rash noted. She is not diaphoretic.  Psychiatric: She has a normal mood and affect.  Vitals reviewed.   ED Course  Procedures   Imaging Review Dg Chest Portable 1 View  07/20/2014   CLINICAL DATA:  Shortness of breath and chest pain and cough with history of asthma  EXAM: PORTABLE CHEST - 1 VIEW  COMPARISON:  PA and lateral chest of April 20, 2013  FINDINGS: The lungs are hyperinflated. There is no focal infiltrate. The heart and pulmonary vascularity are within the limits of normal. The mediastinum is normal in width. There is no pleural effusion, pneumothorax, or pneumomediastinum.  IMPRESSION: Hyperinflation consistent with COPD. There is no evidence of pneumonia nor CHF.   Electronically Signed   By: David  Martinique   On: 07/20/2014 10:22   Dg Tibia/fibula Left Port  07/20/2014   CLINICAL DATA:  Anterior lower leg pain for 2 days. No known injury.  EXAM: PORTABLE LEFT TIBIA AND FIBULA - 2 VIEW  COMPARISON:  None.  FINDINGS: No fracture or  malalignment. No focal bone lesion. No acute soft tissue findings.  IMPRESSION: Negative.   Electronically Signed   By: Jorje Guild M.D.   On: 07/20/2014 12:58    EKG NSR, Rate 90, PR 134, QTc 479, No ST elevation or depression  MDM   Final diagnoses:  Cough  Asthma, unspecified asthma severity, with acute exacerbation  Pain in left tibia    43 y.o. female presents with an asthma exacerbation. Started gradually in the last 2-3 days, worse  this morning. Began coughing last night. Has associated chest "tightness" which she states feels exactly the same as prior asthma exacerbations. States that she has 2-3 exacerbations per year. Has never been intubated. States that usually this time of year she has asthma exacerbations. Denies any sickle cell pain (although she is noted to have the "trait"), fever, chills, nausea, vomiting.  On exam, speaking in 4-5 word sentences. Diminished breath sounds with wheezing in all fields. No retractions.   Will treat her with a continuous albuterol nebulizer and IV steroids. Will reassess. CXR obtained which showed hyperinflation, no pneumonia.   10:58 AM Patient reassessed - on continuous, breathing more comfortably, chest tightness resolved, good air movement with faint wheezing on expiration bilaterally, slightly prolonged expiration  11:30 AM Patient reassessed - continuous finished; removed mask; no wheezing, breathing comfortably; feeling like herself; endorses left tibial pain, no injury, just "sore" when I palpated for edema earlier -- no calf tenderness, she has point tenderness over the distal anterior tibia; x-ray obtained -- no fracture.   1:15 PM Patient reassessed, normal work of breathing, speaking in complete sentences, no wheezing  No concern for Pneumonia, Acute Chest, ACS, PE - very classic presentation for her asthma exacerbation, improved with albuterol.   I discussed strict follow up information. Return precautions given to the  patient. Rx for albuterol inhaler and prednisone given to the patient. She is to follow up with a PCP (resource guide given).   This case managed in conjunction with my attending, Dr. Vanita Panda.  Berenice Primas, MD 07/20/14 [phone]

## 2014-07-20 NOTE — ED Notes (Signed)
43 yo c/o asthma attack starting two days ago. C/o non productive cough with wheezes. Pt ran out of advair two days ago and also ran out of her albuterol. HX of asthma. NAD at this time pt is A/O X4,.

## 2014-07-20 NOTE — ED Notes (Signed)
X-ray in progress 

## 2014-07-20 NOTE — ED Notes (Signed)
Iv removed from left hand, bleeding controlled.

## 2014-08-05 NOTE — Progress Notes (Signed)
[facility] was not able to see patient, [facility] orange card information will be sent to the address provided.

## 2014-10-21 ENCOUNTER — Emergency Department (HOSPITAL_COMMUNITY)
Admission: EM | Admit: 2014-10-21 | Discharge: 2014-10-21 | Disposition: A | Discharge status: Home / Self Care | Payer: Self-pay | Attending: Emergency Medicine | Admitting: Emergency Medicine

## 2014-10-21 ENCOUNTER — Emergency Department (HOSPITAL_COMMUNITY): Payer: Self-pay

## 2014-10-21 ENCOUNTER — Encounter (HOSPITAL_COMMUNITY): Payer: Self-pay | Admitting: *Deleted

## 2014-10-21 DIAGNOSIS — Z862 Personal history of diseases of the blood and blood-forming organs and certain disorders involving the immune mechanism: Secondary | ICD-10-CM | POA: Insufficient documentation

## 2014-10-21 DIAGNOSIS — Z79899 Other long term (current) drug therapy: Secondary | ICD-10-CM | POA: Insufficient documentation

## 2014-10-21 DIAGNOSIS — M25512 Pain in left shoulder: Secondary | ICD-10-CM | POA: Insufficient documentation

## 2014-10-21 DIAGNOSIS — Z791 Long term (current) use of non-steroidal anti-inflammatories (NSAID): Secondary | ICD-10-CM | POA: Insufficient documentation

## 2014-10-21 DIAGNOSIS — Z87891 Personal history of nicotine dependence: Secondary | ICD-10-CM | POA: Insufficient documentation

## 2014-10-21 DIAGNOSIS — F329 Major depressive disorder, single episode, unspecified: Secondary | ICD-10-CM | POA: Insufficient documentation

## 2014-10-21 DIAGNOSIS — J45901 Unspecified asthma with (acute) exacerbation: Secondary | ICD-10-CM | POA: Insufficient documentation

## 2014-10-21 DIAGNOSIS — Z760 Encounter for issue of repeat prescription: Secondary | ICD-10-CM | POA: Insufficient documentation

## 2014-10-21 DIAGNOSIS — Z8742 Personal history of other diseases of the female genital tract: Secondary | ICD-10-CM | POA: Insufficient documentation

## 2014-10-21 DIAGNOSIS — Z8659 Personal history of other mental and behavioral disorders: Secondary | ICD-10-CM

## 2014-10-21 DIAGNOSIS — Z7952 Long term (current) use of systemic steroids: Secondary | ICD-10-CM | POA: Insufficient documentation

## 2014-10-21 DIAGNOSIS — Z7951 Long term (current) use of inhaled steroids: Secondary | ICD-10-CM | POA: Insufficient documentation

## 2014-10-21 LAB — CBC
HCT: 33.7 % — ABNORMAL LOW (ref 36.0–46.0)
HEMOGLOBIN: 11.8 g/dL — AB (ref 12.0–15.0)
MCH: 27.3 pg (ref 26.0–34.0)
MCHC: 35 g/dL (ref 30.0–36.0)
MCV: 77.8 fL — AB (ref 78.0–100.0)
PLATELETS: 278 10*3/uL (ref 150–400)
RBC: 4.33 MIL/uL (ref 3.87–5.11)
RDW: 12.6 % (ref 11.5–15.5)
WBC: 7 10*3/uL (ref 4.0–10.5)

## 2014-10-21 LAB — BASIC METABOLIC PANEL
ANION GAP: 9 (ref 5–15)
BUN: 6 mg/dL (ref 6–23)
CHLORIDE: 108 mmol/L (ref 96–112)
CO2: 20 mmol/L (ref 19–32)
Calcium: 8.4 mg/dL (ref 8.4–10.5)
Creatinine, Ser: 0.82 mg/dL (ref 0.50–1.10)
GFR calc Af Amer: >90 mL/min (ref >90)
GFR calc non Af Amer: 86 mL/min — ABNORMAL LOW (ref >90)
GLUCOSE: 85 mg/dL (ref 70–99)
POTASSIUM: 4 mmol/L (ref 3.5–5.1)
Sodium: 137 mmol/L (ref 135–145)

## 2014-10-21 LAB — I-STAT TROPONIN, ED: TROPONIN I, POC: 0 ng/mL (ref 0.00–0.08)

## 2014-10-21 MED ORDER — IPRATROPIUM-ALBUTEROL 0.5-2.5 (3) MG/3ML IN SOLN
3.0000 mL | Freq: Once | RESPIRATORY_TRACT | Status: AC
  Administered 2014-10-21: 3 mL via RESPIRATORY_TRACT
  Filled 2014-10-21: qty 3

## 2014-10-21 MED ORDER — FLUOXETINE HCL 20 MG PO CAPS: 20.0000 mg | ORAL_CAPSULE | Freq: Every day | ORAL | Status: DC

## 2014-10-21 MED ORDER — FLUTICASONE-SALMETEROL 500-50 MCG/DOSE IN AEPB: 1.0000 | INHALATION_SPRAY | Freq: Two times a day (BID) | RESPIRATORY_TRACT | Status: DC

## 2014-10-21 MED ORDER — PREDNISONE 20 MG PO TABS
60.0000 mg | ORAL_TABLET | Freq: Once | ORAL | Status: AC
  Administered 2014-10-21: 60 mg via ORAL
  Filled 2014-10-21: qty 3

## 2014-10-21 MED ORDER — ALBUTEROL SULFATE HFA 108 (90 BASE) MCG/ACT IN AERS
2.0000 | INHALATION_SPRAY | RESPIRATORY_TRACT | Status: DC | PRN
  Administered 2014-10-21: 2 via RESPIRATORY_TRACT
  Filled 2014-10-21: qty 6.7

## 2014-10-21 NOTE — ED Notes (Signed)
Patient is out of her asthma medications.  She developed sob last night and it continues today.  She is having pain in her left shoulder as well.  Patient is alert.  Patient denies chest pain.

## 2014-10-21 NOTE — ED Provider Notes (Signed)
CSN: [id number]     Arrival date & time 10/21/14  0955 History   First MD Initiated Contact with Patient 10/21/14 1038     Chief Complaint  Patient presents with  . Shortness of Breath  . Wheezing  . Shoulder Pain     (Consider location/radiation/quality/duration/timing/severity/associated sxs/prior Treatment) HPI Pt is a 44yo female with hx of asthma, presenting to ED with c/o gradually worsening SOB with wheeze and left shoulder pain that started last night.  Pt states she is out of her asthma medication-albuterol and atrovent.  Pt describes shoulder pain as sharp in nature, started last night when SOB started, has been constant since onset, 7/10 in severity. Pt has not taken any pain medication PTA. Denies fever, chills, congestion, n/v/d. Denies hx of CAD but does report her father had and MI before age 51.    Past Medical History  Diagnosis Date  . Allergy     pollen, pcn  . Asthma   . Fibroids   . History of ovarian cyst   . Sickle cell anemia     has the trait  . Menometrorrhagia   . Sickle cell trait   . Depression   . Medical history non-contributory     sickle cell   Past Surgical History  Procedure Laterality Date  . Cesarean section      all three pregnancies   Family History  Problem Relation Age of Onset  . Cancer Mother     colon cancer  . Hypertension Mother   . Heart disease Father   . Cancer Father     cancer  . Other Neg Hx    History  Substance Use Topics  . Smoking status: Former Smoker -- 0.20 packs/day    Types: Cigarettes    Quit date: 07/30/2012  . Smokeless tobacco: Never Used     Comment: cessation info given  . Alcohol Use: No   OB History    Gravida Para Term Preterm AB TAB SAB Ectopic Multiple Living   3 3 0       3     Review of Systems  Constitutional: Negative for fever, chills and fatigue.  HENT: Negative for congestion and sore throat.   Respiratory: Positive for cough, shortness of breath and wheezing. Negative for  stridor.   Cardiovascular: Negative for chest pain, palpitations and leg swelling.  Gastrointestinal: Negative for nausea, vomiting, abdominal pain and diarrhea.  Musculoskeletal: Positive for arthralgias. Negative for myalgias, joint swelling and neck stiffness. Back pain:  left shoulder.  All other systems reviewed and are negative.     Allergies  Bupropion hcl er (sr); Fruit & vegetable daily; Penicillins; Pollen extract-tree extract; and Other  Home Medications   Prior to Admission medications   Medication Sig Start Date End Date Taking? Authorizing Provider  albuterol (PROVENTIL HFA;VENTOLIN HFA) 108 (90 BASE) MCG/ACT inhaler Inhale 2 puffs into the lungs daily as needed for shortness of breath. 06/12/13   Domenic Moras, PA-C  albuterol (PROVENTIL HFA;VENTOLIN HFA) 108 (90 BASE) MCG/ACT inhaler Inhale 2 puffs into the lungs every 4 (four) hours as needed for wheezing or shortness of breath. 07/20/14   Berenice Primas, MD  diazepam (VALIUM) 5 MG tablet Take 5 mg by mouth every 6 (six) hours as needed for anxiety. 05/10/13   Garald Balding, NP  FLUoxetine (PROZAC) 20 MG capsule Take 1 capsule (20 mg total) by mouth daily. 10/21/14   Noland Fordyce, PA-C  Fluticasone-Salmeterol (ADVAIR) 500-50 MCG/DOSE AEPB Inhale 1  puff into the lungs every 12 (twelve) hours. 10/21/14   Noland Fordyce, PA-C  HYDROcodone-acetaminophen (NORCO) 5-325 MG per tablet Take 1 tablet by mouth every 6 (six) hours as needed for pain. 05/10/13   Garald Balding, NP  levocetirizine (XYZAL) 5 MG tablet Take 1 tablet (5 mg total) by mouth every evening. 06/12/13   Domenic Moras, PA-C  meclizine (ANTIVERT) 50 MG tablet Take 50 mg by mouth 3 (three) times daily as needed for dizziness.  06/19/13   Johnna Acosta, MD  naproxen (NAPROSYN) 500 MG tablet Take 500 mg by mouth 2 (two) times daily with a meal. 06/19/13   Johnna Acosta, MD  oxyCODONE-acetaminophen (PERCOCET/ROXICET) 5-325 MG per tablet Take 1 tablet by mouth every 6 (six) hours as needed  for moderate pain. 06/19/13   Johnna Acosta, MD  predniSONE (DELTASONE) 10 MG tablet Take 4 tablets (40 mg total) by mouth daily. 07/21/14   Berenice Primas, MD  traMADol (ULTRAM) 50 MG tablet Take 50 mg by mouth every 6 (six) hours as needed for moderate pain.    Historical Provider, MD  traZODone (DESYREL) 50 MG tablet Take 1 tablet (50 mg total) by mouth at bedtime as needed for sleep. 06/12/13   Domenic Moras, PA-C   BP 107/87 mmHg  Pulse 72  Temp(Src) 97.9 F (36.6 C) (Oral)  Resp 17  SpO2 99%  LMP 10/08/2014 Physical Exam  Constitutional: She appears well-developed and well-nourished. No distress.  HENT:  Head: Normocephalic and atraumatic.  Eyes: Conjunctivae are normal. No scleral icterus.  Neck: Normal range of motion.  Cardiovascular: Normal rate, regular rhythm and normal heart sounds.   Pulmonary/Chest: Effort normal. No respiratory distress. She has wheezes. She has no rales. She exhibits no tenderness.  Diffuse expiratory wheeze, no respiratory distress. Able to speak in full sentences, no accessory muscle use.  Abdominal: Soft. Bowel sounds are normal. She exhibits no distension and no mass. There is no tenderness. There is no rebound and no guarding.  Musculoskeletal: Normal range of motion.  Neurological: She is alert.  Skin: Skin is warm and dry. She is not diaphoretic.  Nursing note and vitals reviewed.   ED Course  Procedures (including critical care time) Labs Review Labs Reviewed  CBC - Abnormal; Notable for the following:    Hemoglobin 11.8 (*)    HCT 33.7 (*)    MCV 77.8 (*)    All other components within normal limits  BASIC METABOLIC PANEL - Abnormal; Notable for the following:    GFR calc non Af Amer 86 (*)    All other components within normal limits  I-STAT TROPOININ, ED    Imaging Review Dg Chest 2 View  10/21/2014   CLINICAL DATA:  Left arm pain and shortness of breath.  EXAM: CHEST - 2 VIEW  COMPARISON:  07/20/2014  FINDINGS: The heart size and  mediastinal contours are within normal limits. There is no evidence of pulmonary edema, consolidation, pneumothorax, nodule or pleural fluid. The visualized skeletal structures are unremarkable.  IMPRESSION: No active disease.   Electronically Signed   By: Aletta Edouard M.D.   On: 10/21/2014 12:07     EKG Interpretation   Date/Time:  Thursday October 21 2014 10:28:34 EST Ventricular Rate:  79 PR Interval:  144 QRS Duration: 74 QT Interval:  400 QTC Calculation: 458 R Axis:   63 Text Interpretation:  Normal sinus rhythm Normal ECG Confirmed by BEATON   MD, ROBERT ([id number]) on 10/21/2014 11:36:49 AM  MDM   Final diagnoses:  Asthma exacerbation  History of depression  Medication refill    Pt is a 44yo female with hx of asthma, presenting to ED with c/o SOB, out of her medications.  Pt also c/o left shoulder pain, constant since onset last night. Pt is low risk for major cardiac event based on HEART score, will get 1 troponin in ED.  PERC negative. Doubt pneumothorax or pneumonia.  CXR: unremarkable EKG: normal Labs: unremarkable.  Pt given 60mg  prednisone and duoneb tx.  12:21 PM pt states she is feeling like she can breath better. Lungs: CTAB  Pt has spoken with case management about orange card and plans to f/u with [facility].  Pt hemodynamically stable for discharge home. Medication refills provided for advair, 15 days of prozac, and albuterol inhaler given in ED.  Return precautions provided. Pt verbalized understanding and agreement with tx plan.    Noland Fordyce, PA-C 10/21/14 [place], PA-C 10/21/14 1233  Dot Lanes, MD 10/21/14 [phone]

## 2014-10-21 NOTE — ED Notes (Signed)
Patient returned back from xray and placed back on monitor.

## 2014-10-21 NOTE — Discharge Instructions (Signed)

## 2014-11-15 ENCOUNTER — Emergency Department (HOSPITAL_COMMUNITY)
Admission: EM | Admit: 2014-11-15 | Discharge: 2014-11-15 | Disposition: A | Discharge status: Home / Self Care | Payer: Self-pay | Attending: Emergency Medicine | Admitting: Emergency Medicine

## 2014-11-15 ENCOUNTER — Encounter (HOSPITAL_COMMUNITY): Payer: Self-pay

## 2014-11-15 ENCOUNTER — Emergency Department (HOSPITAL_COMMUNITY): Payer: Self-pay

## 2014-11-15 DIAGNOSIS — Z8742 Personal history of other diseases of the female genital tract: Secondary | ICD-10-CM | POA: Insufficient documentation

## 2014-11-15 DIAGNOSIS — Z791 Long term (current) use of non-steroidal anti-inflammatories (NSAID): Secondary | ICD-10-CM | POA: Insufficient documentation

## 2014-11-15 DIAGNOSIS — Z79899 Other long term (current) drug therapy: Secondary | ICD-10-CM | POA: Insufficient documentation

## 2014-11-15 DIAGNOSIS — Z880 Allergy status to penicillin: Secondary | ICD-10-CM | POA: Insufficient documentation

## 2014-11-15 DIAGNOSIS — Z7952 Long term (current) use of systemic steroids: Secondary | ICD-10-CM | POA: Insufficient documentation

## 2014-11-15 DIAGNOSIS — M25512 Pain in left shoulder: Secondary | ICD-10-CM

## 2014-11-15 DIAGNOSIS — G8929 Other chronic pain: Secondary | ICD-10-CM | POA: Insufficient documentation

## 2014-11-15 DIAGNOSIS — Z87891 Personal history of nicotine dependence: Secondary | ICD-10-CM | POA: Insufficient documentation

## 2014-11-15 DIAGNOSIS — Z862 Personal history of diseases of the blood and blood-forming organs and certain disorders involving the immune mechanism: Secondary | ICD-10-CM | POA: Insufficient documentation

## 2014-11-15 DIAGNOSIS — F329 Major depressive disorder, single episode, unspecified: Secondary | ICD-10-CM | POA: Insufficient documentation

## 2014-11-15 DIAGNOSIS — J45901 Unspecified asthma with (acute) exacerbation: Secondary | ICD-10-CM

## 2014-11-15 LAB — BASIC METABOLIC PANEL
Anion gap: 3 — ABNORMAL LOW (ref 5–15)
BUN: <5 mg/dL — ABNORMAL LOW (ref 6–23)
CALCIUM: 8.2 mg/dL — AB (ref 8.4–10.5)
CO2: 22 mmol/L (ref 19–32)
Chloride: 111 mmol/L (ref 96–112)
Creatinine, Ser: 0.76 mg/dL (ref 0.50–1.10)
GFR calc Af Amer: >90 mL/min (ref >90)
GLUCOSE: 87 mg/dL (ref 70–99)
Potassium: 3.9 mmol/L (ref 3.5–5.1)
Sodium: 136 mmol/L (ref 135–145)

## 2014-11-15 LAB — I-STAT TROPONIN, ED: Troponin i, poc: 0 ng/mL (ref 0.00–0.08)

## 2014-11-15 LAB — CBC
HEMATOCRIT: 35.7 % — AB (ref 36.0–46.0)
Hemoglobin: 12.2 g/dL (ref 12.0–15.0)
MCH: 27.6 pg (ref 26.0–34.0)
MCHC: 34.2 g/dL (ref 30.0–36.0)
MCV: 80.8 fL (ref 78.0–100.0)
Platelets: 299 10*3/uL (ref 150–400)
RBC: 4.42 MIL/uL (ref 3.87–5.11)
RDW: 13.3 % (ref 11.5–15.5)
WBC: 5.2 10*3/uL (ref 4.0–10.5)

## 2014-11-15 MED ORDER — HYDROCODONE-ACETAMINOPHEN 5-325 MG PO TABS
1.0000 | ORAL_TABLET | Freq: Once | ORAL | Status: AC
  Administered 2014-11-15: 1 via ORAL
  Filled 2014-11-15: qty 1

## 2014-11-15 MED ORDER — HYDROCODONE-ACETAMINOPHEN 5-325 MG PO TABS: 1.0000 | ORAL_TABLET | Freq: Four times a day (QID) | ORAL | Status: DC | PRN

## 2014-11-15 MED ORDER — ALBUTEROL SULFATE HFA 108 (90 BASE) MCG/ACT IN AERS
2.0000 | INHALATION_SPRAY | Freq: Once | RESPIRATORY_TRACT | Status: AC
  Administered 2014-11-15: 2 via RESPIRATORY_TRACT
  Filled 2014-11-15: qty 6.7

## 2014-11-15 MED ORDER — PREDNISONE 10 MG PO TABS: 40.0000 mg | ORAL_TABLET | Freq: Every day | ORAL | Status: DC

## 2014-11-15 MED ORDER — PREDNISONE 20 MG PO TABS
40.0000 mg | ORAL_TABLET | Freq: Once | ORAL | Status: AC
  Administered 2014-11-15: 40 mg via ORAL
  Filled 2014-11-15: qty 2

## 2014-11-15 MED ORDER — IPRATROPIUM-ALBUTEROL 0.5-2.5 (3) MG/3ML IN SOLN
3.0000 mL | Freq: Once | RESPIRATORY_TRACT | Status: AC
  Administered 2014-11-15: 3 mL via RESPIRATORY_TRACT
  Filled 2014-11-15: qty 3

## 2014-11-15 NOTE — ED Notes (Signed)
Pt here for asthma attack for a few minutes prior to arrival and shoulder pain since last night, sts pain increases with movement, radiates down back and left arm.

## 2014-11-15 NOTE — Discharge Instructions (Signed)
Asthma, Acute Bronchospasm °Acute bronchospasm caused by asthma is also referred to as an asthma attack. Bronchospasm means your air passages become narrowed. The narrowing is caused by inflammation and tightening of the muscles in the air tubes (bronchi) in your lungs. This can make it hard to breathe or cause you to wheeze and cough. °CAUSES °Possible triggers are: °· Animal dander from the skin, hair, or feathers of animals. °· Dust mites contained in house dust. °· Cockroaches. °· Pollen from trees or grass. °· Mold. °· Cigarette or tobacco smoke. °· Air pollutants such as dust, household cleaners, hair sprays, aerosol sprays, paint fumes, strong chemicals, or strong odors. °· Cold air or weather changes. Cold air may trigger inflammation. Winds increase molds and pollens in the air. °· Strong emotions such as crying or laughing hard. °· Stress. °· Certain medicines such as aspirin or beta-blockers. °· Sulfites in foods and drinks, such as dried fruits and wine. °· Infections or inflammatory conditions, such as a flu, cold, or inflammation of the nasal membranes (rhinitis). °· Gastroesophageal reflux disease (GERD). GERD is a condition where stomach acid backs up into your esophagus. °· Exercise or strenuous activity. °SIGNS AND SYMPTOMS  °· Wheezing. °· Excessive coughing, particularly at night. °· Chest tightness. °· Shortness of breath. °DIAGNOSIS  °Your health care provider will ask you about your medical history and perform a physical exam. A chest X-ray or blood testing may be performed to look for other causes of your symptoms or other conditions that may have triggered your asthma attack.  °TREATMENT  °Treatment is aimed at reducing inflammation and opening up the airways in your lungs.  Most asthma attacks are treated with inhaled medicines. These include quick relief or rescue medicines (such as bronchodilators) and controller medicines (such as inhaled corticosteroids). These medicines are sometimes  given through an inhaler or a nebulizer. Systemic steroid medicine taken by mouth or given through an IV tube also can be used to reduce the inflammation when an attack is moderate or severe. Antibiotic medicines are only used if a bacterial infection is present.  °HOME CARE INSTRUCTIONS  °· Rest. °· Drink plenty of liquids. This helps the mucus to remain thin and be easily coughed up. Only use caffeine in moderation and do not use alcohol until you have recovered from your illness. °· Do not smoke. Avoid being exposed to secondhand smoke. °· You play a critical role in keeping yourself in good health. Avoid exposure to things that cause you to wheeze or to have breathing problems. °· Keep your medicines up-to-date and available. Carefully follow your health care provider's treatment plan. °· Take your medicine exactly as prescribed. °· When pollen or pollution is bad, keep windows closed and use an air conditioner or go to places with air conditioning. °· Asthma requires careful medical care. See your health care provider for a follow-up as advised. If you are more than [redacted] weeks pregnant and you were prescribed any new medicines, let your obstetrician know about the visit and how you are doing. Follow up with your health care provider as directed. °· After you have recovered from your asthma attack, make an appointment with your outpatient doctor to talk about ways to reduce the likelihood of future attacks. If you do not have a doctor who manages your asthma, make an appointment with a primary care doctor to discuss your asthma. °SEEK IMMEDIATE MEDICAL CARE IF:  °· You are getting worse. °· You have trouble breathing. If severe, call your local   emergency services (911 in the [place]  You develop chest pain or discomfort.  You are vomiting.  You are not able to keep fluids down.  You are coughing up yellow, green, brown, or bloody sputum.  You have a fever and your symptoms suddenly get worse.  You have  trouble swallowing. MAKE SURE YOU:   Understand these instructions.  Will watch your condition.  Will get help right away if you are not doing well or get worse. Document Released: 12/19/2006 Document Revised: 09/08/2013 Document Reviewed: 03/11/2013 [facility] Patient Information 2015 [place], [place]. This information is not intended to replace advice given to you by your health care provider. Make sure you discuss any questions you have with your health care provider. Shoulder Pain The shoulder is the joint that connects your arms to your body. The bones that form the shoulder joint include the upper arm bone (humerus), the shoulder blade (scapula), and the collarbone (clavicle). The top of the humerus is shaped like a ball and fits into a rather flat socket on the scapula (glenoid cavity). A combination of muscles and strong, fibrous tissues that connect muscles to bones (tendons) support your shoulder joint and hold the ball in the socket. Small, fluid-filled sacs (bursae) are located in different areas of the joint. They act as cushions between the bones and the overlying soft tissues and help reduce friction between the gliding tendons and the bone as you move your arm. Your shoulder joint allows a wide range of motion in your arm. This range of motion allows you to do things like scratch your back or throw a ball. However, this range of motion also makes your shoulder more prone to pain from overuse and injury. Causes of shoulder pain can originate from both injury and overuse and usually can be grouped in the following four categories:  Redness, swelling, and pain (inflammation) of the tendon (tendinitis) or the bursae (bursitis).  Instability, such as a dislocation of the joint.  Inflammation of the joint (arthritis).  Broken bone (fracture). HOME CARE INSTRUCTIONS   Apply ice to the sore area.  Put ice in a plastic bag.  Place a towel between your skin and the bag.  Leave the ice  on for 15-20 minutes, 3-4 times per day for the first 2 days, or as directed by your health care provider.  Stop using cold packs if they do not help with the pain.  If you have a shoulder sling or immobilizer, wear it as long as your caregiver instructs. Only remove it to shower or bathe. Move your arm as little as possible, but keep your hand moving to prevent swelling.  Squeeze a soft ball or foam pad as much as possible to help prevent swelling.  Only take over-the-counter or prescription medicines for pain, discomfort, or fever as directed by your caregiver. SEEK MEDICAL CARE IF:   Your shoulder pain increases, or new pain develops in your arm, hand, or fingers.  Your hand or fingers become cold and numb.  Your pain is not relieved with medicines. SEEK IMMEDIATE MEDICAL CARE IF:   Your arm, hand, or fingers are numb or tingling.  Your arm, hand, or fingers are significantly swollen or turn white or blue. MAKE SURE YOU:   Understand these instructions.  Will watch your condition.  Will get help right away if you are not doing well or get worse. Document Released: 06/13/2005 Document Revised: 01/18/2014 Document Reviewed: 08/18/2011 [facility] Patient Information 2015 [place], [place]. This information is  not intended to replace advice given to you by your health care provider. Make sure you discuss any questions you have with your health care provider.

## 2014-11-15 NOTE — ED Provider Notes (Signed)
CSN: [id number]     Arrival date & time 11/15/14  1117 History   First MD Initiated Contact with Patient 11/15/14 1220     Chief Complaint  Patient presents with  . Asthma  . Shoulder Pain     Patient is a 44 y.o. female presenting with asthma and shoulder pain. The history is provided by the patient. No language interpreter was used.  Asthma  Shoulder Pain  [name] presents for evaluation of asthma attack and left shoulder pain.  Sxs started at 3am and woke her from sleep.  The pain is in the back of the shoulder and described as someone is jabbing something in her shoulder.  Pain is constant, non radiating, worse with movement.  Pt is right handed.  Sob is described as tightness with associated dry cough.  SOB feels typical for her asthma.  Sxs are moderate, constant, worsening.  She is currently out of the albuterol.  No hx/o trauma. Sxs are moderate, constant.    Past Medical History  Diagnosis Date  . Allergy     pollen, pcn  . Asthma   . Fibroids   . History of ovarian cyst   . Sickle cell anemia     has the trait  . Menometrorrhagia   . Sickle cell trait   . Depression   . Medical history non-contributory     sickle cell   Past Surgical History  Procedure Laterality Date  . Cesarean section      all three pregnancies   Family History  Problem Relation Age of Onset  . Cancer Mother     colon cancer  . Hypertension Mother   . Heart disease Father   . Cancer Father     cancer  . Other Neg Hx    History  Substance Use Topics  . Smoking status: Former Smoker -- 0.20 packs/day    Types: Cigarettes    Quit date: 07/30/2012  . Smokeless tobacco: Never Used     Comment: cessation info given  . Alcohol Use: No   OB History    Gravida Para Term Preterm AB TAB SAB Ectopic Multiple Living   3 3 0       3     Review of Systems  All other systems reviewed and are negative.     Allergies  Bupropion hcl er (sr); Fruit & vegetable daily; Penicillins; Pollen  extract-tree extract; and Other  Home Medications   Prior to Admission medications   Medication Sig Start Date End Date Taking? Authorizing Provider  albuterol (PROVENTIL HFA;VENTOLIN HFA) 108 (90 BASE) MCG/ACT inhaler Inhale 2 puffs into the lungs daily as needed for shortness of breath. 06/12/13   Domenic Moras, PA-C  albuterol (PROVENTIL HFA;VENTOLIN HFA) 108 (90 BASE) MCG/ACT inhaler Inhale 2 puffs into the lungs every 4 (four) hours as needed for wheezing or shortness of breath. 07/20/14   Berenice Primas, MD  diazepam (VALIUM) 5 MG tablet Take 5 mg by mouth every 6 (six) hours as needed for anxiety. 05/10/13   Garald Balding, NP  FLUoxetine (PROZAC) 20 MG capsule Take 1 capsule (20 mg total) by mouth daily. 10/21/14   Noland Fordyce, PA-C  Fluticasone-Salmeterol (ADVAIR) 500-50 MCG/DOSE AEPB Inhale 1 puff into the lungs every 12 (twelve) hours. 10/21/14   Noland Fordyce, PA-C  HYDROcodone-acetaminophen (NORCO) 5-325 MG per tablet Take 1 tablet by mouth every 6 (six) hours as needed for pain. 05/10/13   Garald Balding, NP  levocetirizine Harlow Ohms)  5 MG tablet Take 1 tablet (5 mg total) by mouth every evening. 06/12/13   Domenic Moras, PA-C  meclizine (ANTIVERT) 50 MG tablet Take 50 mg by mouth 3 (three) times daily as needed for dizziness.  06/19/13   Johnna Acosta, MD  naproxen (NAPROSYN) 500 MG tablet Take 500 mg by mouth 2 (two) times daily with a meal. 06/19/13   Johnna Acosta, MD  oxyCODONE-acetaminophen (PERCOCET/ROXICET) 5-325 MG per tablet Take 1 tablet by mouth every 6 (six) hours as needed for moderate pain. 06/19/13   Johnna Acosta, MD  predniSONE (DELTASONE) 10 MG tablet Take 4 tablets (40 mg total) by mouth daily. 07/21/14   Berenice Primas, MD  traMADol (ULTRAM) 50 MG tablet Take 50 mg by mouth every 6 (six) hours as needed for moderate pain.    Historical Provider, MD  traZODone (DESYREL) 50 MG tablet Take 1 tablet (50 mg total) by mouth at bedtime as needed for sleep. 06/12/13   Domenic Moras, PA-C   BP  115/92 mmHg  Pulse 93  Temp(Src) 98.2 F (36.8 C) (Oral)  Resp 16  Ht 5\' 1"  (1.549 m)  Wt 130 lb (58.968 kg)  BMI 24.58 kg/m2  SpO2 97%  LMP 10/31/2014 Physical Exam  Constitutional: She is oriented to person, place, and time. She appears well-developed and well-nourished.  HENT:  Head: Normocephalic and atraumatic.  Cardiovascular: Normal rate and regular rhythm.   No murmur heard. Pulmonary/Chest: Effort normal. No respiratory distress. She has wheezes.  Abdominal: Soft. There is no tenderness. There is no rebound and no guarding.  Musculoskeletal: She exhibits no edema or tenderness.  2+ radial pulses in BUE.  TTP over left upper shoulder with decreased ROM secondary to pain.  No erythema.  No dislocation.    Neurological: She is alert and oriented to person, place, and time.  Skin: Skin is warm and dry.  Psychiatric: She has a normal mood and affect. Her behavior is normal.  Nursing note and vitals reviewed.   ED Course  Procedures (including critical care time) Labs Review Labs Reviewed  BASIC METABOLIC PANEL - Abnormal; Notable for the following:    BUN <5 (*)    Calcium 8.2 (*)    Anion gap 3 (*)    All other components within normal limits  CBC - Abnormal; Notable for the following:    HCT 35.7 (*)    All other components within normal limits  I-STAT TROPOININ, ED    Imaging Review Dg Chest 2 View (if Patient Has Fever And/or Copd)  11/15/2014   CLINICAL DATA:  Acute asthma attack.  EXAM: CHEST  2 VIEW  COMPARISON:  October 21, 2014.  FINDINGS: The heart size and mediastinal contours are within normal limits. Both lungs are clear. No pneumothorax or pleural effusion is noted. The visualized skeletal structures are unremarkable.  IMPRESSION: No acute cardiopulmonary abnormality seen.   Electronically Signed   By: Marijo Conception, M.D.   On: 11/15/2014 13:12     EKG Interpretation   Date/Time:  Monday November 15 2014 11:26:08 EST Ventricular Rate:  89 PR  Interval:  132 QRS Duration: 70 QT Interval:  380 QTC Calculation: 462 R Axis:   82 Text Interpretation:  Normal sinus rhythm Normal ECG Confirmed by Hazle Coca [phone]) on 11/15/2014 12:24:57 PM      MDM   Final diagnoses:  Acute asthma exacerbation, unspecified asthma severity  Shoulder pain, acute, left    Pt reports feeling improved  on recheck after nebulizer treatment.    Clinical picture is not consistent with ACS or PE. Shoulder pain is very reproducible on exam, suspect musculoskeletal spasm. Discussed with patient home care for asthma as well as musculoskeletal pain. Discussed importance of PCP follow-up as well as return precautions.  Quintella Reichert, MD 11/15/14 [phone]

## 2014-11-15 NOTE — ED Notes (Signed)
Pt ambblated to restroom with steady gait. No SOB reported

## 2014-12-14 ENCOUNTER — Emergency Department (HOSPITAL_COMMUNITY): Payer: Self-pay

## 2014-12-14 ENCOUNTER — Emergency Department (HOSPITAL_COMMUNITY)
Admission: EM | Admit: 2014-12-14 | Discharge: 2014-12-14 | Disposition: A | Discharge status: Home / Self Care | Payer: Self-pay | Attending: Emergency Medicine | Admitting: Emergency Medicine

## 2014-12-14 ENCOUNTER — Ambulatory Visit: Payer: Self-pay | Admitting: Internal Medicine

## 2014-12-14 ENCOUNTER — Encounter (HOSPITAL_COMMUNITY): Payer: Self-pay | Admitting: Emergency Medicine

## 2014-12-14 DIAGNOSIS — Z862 Personal history of diseases of the blood and blood-forming organs and certain disorders involving the immune mechanism: Secondary | ICD-10-CM | POA: Insufficient documentation

## 2014-12-14 DIAGNOSIS — Z791 Long term (current) use of non-steroidal anti-inflammatories (NSAID): Secondary | ICD-10-CM | POA: Insufficient documentation

## 2014-12-14 DIAGNOSIS — J45901 Unspecified asthma with (acute) exacerbation: Secondary | ICD-10-CM | POA: Insufficient documentation

## 2014-12-14 DIAGNOSIS — Z87891 Personal history of nicotine dependence: Secondary | ICD-10-CM | POA: Insufficient documentation

## 2014-12-14 DIAGNOSIS — F329 Major depressive disorder, single episode, unspecified: Secondary | ICD-10-CM | POA: Insufficient documentation

## 2014-12-14 DIAGNOSIS — Z79899 Other long term (current) drug therapy: Secondary | ICD-10-CM | POA: Insufficient documentation

## 2014-12-14 DIAGNOSIS — Z880 Allergy status to penicillin: Secondary | ICD-10-CM | POA: Insufficient documentation

## 2014-12-14 DIAGNOSIS — Z7951 Long term (current) use of inhaled steroids: Secondary | ICD-10-CM | POA: Insufficient documentation

## 2014-12-14 DIAGNOSIS — Z8742 Personal history of other diseases of the female genital tract: Secondary | ICD-10-CM | POA: Insufficient documentation

## 2014-12-14 LAB — CBC
HEMATOCRIT: 36.8 % (ref 36.0–46.0)
Hemoglobin: 12.7 g/dL (ref 12.0–15.0)
MCH: 27.6 pg (ref 26.0–34.0)
MCHC: 34.5 g/dL (ref 30.0–36.0)
MCV: 80 fL (ref 78.0–100.0)
Platelets: 299 10*3/uL (ref 150–400)
RBC: 4.6 MIL/uL (ref 3.87–5.11)
RDW: 13.7 % (ref 11.5–15.5)
WBC: 7.1 10*3/uL (ref 4.0–10.5)

## 2014-12-14 LAB — BASIC METABOLIC PANEL
Anion gap: 4 — ABNORMAL LOW (ref 5–15)
BUN: 5 mg/dL — ABNORMAL LOW (ref 6–23)
CO2: 26 mmol/L (ref 19–32)
CREATININE: 0.88 mg/dL (ref 0.50–1.10)
Calcium: 8.3 mg/dL — ABNORMAL LOW (ref 8.4–10.5)
Chloride: 108 mmol/L (ref 96–112)
GFR, EST NON AFRICAN AMERICAN: 78 mL/min — AB (ref >90)
Glucose, Bld: 95 mg/dL (ref 70–99)
Potassium: 3.9 mmol/L (ref 3.5–5.1)
Sodium: 138 mmol/L (ref 135–145)

## 2014-12-14 LAB — I-STAT TROPONIN, ED: Troponin i, poc: 0 ng/mL (ref 0.00–0.08)

## 2014-12-14 MED ORDER — IPRATROPIUM-ALBUTEROL 0.5-2.5 (3) MG/3ML IN SOLN
3.0000 mL | Freq: Once | RESPIRATORY_TRACT | Status: AC
  Administered 2014-12-14: 3 mL via RESPIRATORY_TRACT

## 2014-12-14 MED ORDER — ALBUTEROL SULFATE HFA 108 (90 BASE) MCG/ACT IN AERS: 2.0000 | INHALATION_SPRAY | RESPIRATORY_TRACT | Status: DC | PRN

## 2014-12-14 MED ORDER — PREDNISONE 10 MG PO TABS: 20.0000 mg | ORAL_TABLET | Freq: Two times a day (BID) | ORAL | Status: DC

## 2014-12-14 MED ORDER — IPRATROPIUM-ALBUTEROL 0.5-2.5 (3) MG/3ML IN SOLN
RESPIRATORY_TRACT | Status: AC
  Administered 2014-12-14: 3 mL via RESPIRATORY_TRACT
  Filled 2014-12-14: qty 3

## 2014-12-14 MED ORDER — ALBUTEROL SULFATE (2.5 MG/3ML) 0.083% IN NEBU
5.0000 mg | INHALATION_SOLUTION | Freq: Once | RESPIRATORY_TRACT | Status: AC
  Administered 2014-12-14: 5 mg via RESPIRATORY_TRACT
  Filled 2014-12-14: qty 6

## 2014-12-14 MED ORDER — ALBUTEROL SULFATE HFA 108 (90 BASE) MCG/ACT IN AERS
2.0000 | INHALATION_SPRAY | RESPIRATORY_TRACT | Status: DC | PRN
  Administered 2014-12-14: 2 via RESPIRATORY_TRACT
  Filled 2014-12-14: qty 6.7

## 2014-12-14 MED ORDER — PREDNISONE 20 MG PO TABS
20.0000 mg | ORAL_TABLET | Freq: Once | ORAL | Status: AC
  Administered 2014-12-14: 20 mg via ORAL
  Filled 2014-12-14: qty 1

## 2014-12-14 MED ORDER — IPRATROPIUM BROMIDE HFA 17 MCG/ACT IN AERS: 2.0000 | INHALATION_SPRAY | Freq: Two times a day (BID) | RESPIRATORY_TRACT | Status: DC

## 2014-12-14 NOTE — ED Notes (Signed)
Pt states that her asthma attacks do not send her into sickle crisis and that she has never had a sickle cell crisis that she is aware of.

## 2014-12-14 NOTE — ED Provider Notes (Signed)
CSN: [id number]     Arrival date & time 12/14/14  0008 History  This chart was scribed for Veryl Speak, MD by Randa Evens, ED Scribe. This patient was seen in room A05C/A05C and the patient's care was started at 1:54 AM.      Chief Complaint  Patient presents with  . Shortness of Breath   Patient is a 44 y.o. female presenting with shortness of breath. The history is provided by the patient. No language interpreter was used.  Shortness of Breath Severity:  Moderate Onset quality:  Sudden Progression:  Improving Context: pollens and smoke exposure   Relieved by:  Nothing Ineffective treatments:  Inhaler Associated symptoms: cough and wheezing   Associated symptoms: no fever   Risk factors: tobacco use    HPI Comments: [name] is a 44 y.o. female who presents to the Emergency Department complaining of SOB onset 1 hour PTA. Pt states she had associated wheezing and cough. Pt states she tried her albuterol inhaler with no relief. Pt states that she takes asthma medication daily and states that she has recently ran out of all her medications. Pt states she has ran out of her albuterol and Advair. Pt states that he asthma flares up with the pollen. Pt state she has had an nebulizer treatment since being in the ED that has provided slight relief. Pt doesn't report any other symptoms. Pt is an everyday smoker as well.   Past Medical History  Diagnosis Date  . Allergy     pollen, pcn  . Asthma   . Fibroids   . History of ovarian cyst   . Sickle cell anemia     has the trait  . Menometrorrhagia   . Sickle cell trait   . Depression   . Medical history non-contributory     sickle cell   Past Surgical History  Procedure Laterality Date  . Cesarean section      all three pregnancies   Family History  Problem Relation Age of Onset  . Cancer Mother     colon cancer  . Hypertension Mother   . Heart disease Father   . Cancer Father     cancer  . Other Neg Hx    History   Substance Use Topics  . Smoking status: Former Smoker -- 0.20 packs/day    Types: Cigarettes    Quit date: 07/30/2012  . Smokeless tobacco: Never Used     Comment: cessation info given  . Alcohol Use: No   OB History    Gravida Para Term Preterm AB TAB SAB Ectopic Multiple Living   3 3 0       3      Review of Systems  Constitutional: Negative for fever.  Respiratory: Positive for cough, shortness of breath and wheezing.   All other systems reviewed and are negative.     Allergies  Bupropion hcl er (sr); Fruit & vegetable daily; Penicillins; Pollen extract-tree extract; and Other  Home Medications   Prior to Admission medications   Medication Sig Start Date End Date Taking? Authorizing Provider  albuterol (PROVENTIL HFA;VENTOLIN HFA) 108 (90 BASE) MCG/ACT inhaler Inhale 2 puffs into the lungs daily as needed for shortness of breath. 06/12/13  Yes Domenic Moras, PA-C  FLUoxetine (PROZAC) 20 MG capsule Take 1 capsule (20 mg total) by mouth daily. 10/21/14  Yes Noland Fordyce, PA-C  Fluticasone-Salmeterol (ADVAIR) 500-50 MCG/DOSE AEPB Inhale 1 puff into the lungs every 12 (twelve) hours. 10/21/14  Yes  Noland Fordyce, PA-C  HYDROcodone-acetaminophen (NORCO/VICODIN) 5-325 MG per tablet Take 1 tablet by mouth every 6 (six) hours as needed. 11/15/14  Yes Quintella Reichert, MD  levocetirizine (XYZAL) 5 MG tablet Take 1 tablet (5 mg total) by mouth every evening. 06/12/13  Yes Domenic Moras, PA-C  naproxen (NAPROSYN) 500 MG tablet Take 500 mg by mouth 2 (two) times daily with a meal. 06/19/13  Yes Noemi Chapel, MD  traZODone (DESYREL) 50 MG tablet Take 1 tablet (50 mg total) by mouth at bedtime as needed for sleep. 06/12/13  Yes Domenic Moras, PA-C  ipratropium (ATROVENT HFA) 17 MCG/ACT inhaler Inhale 2 puffs into the lungs 2 (two) times daily. 12/14/14   Veryl Speak, MD  predniSONE (DELTASONE) 10 MG tablet Take 2 tablets (20 mg total) by mouth 2 (two) times daily. 12/14/14   Veryl Speak, MD   BP 113/69  mmHg  Pulse 91  Temp(Src) 97.8 F (36.6 C) (Axillary)  Resp 13  Wt 130 lb (58.968 kg)  SpO2 97%  LMP 11/30/2014   Physical Exam  Constitutional: She is oriented to person, place, and time. She appears well-developed and well-nourished. No distress.  HENT:  Head: Normocephalic and atraumatic.  Eyes: Conjunctivae and EOM are normal.  Neck: Neck supple. No tracheal deviation present.  Cardiovascular: Normal rate, regular rhythm and normal heart sounds.   Pulmonary/Chest: Effort normal. No respiratory distress. She has wheezes.  Slight expiratory wheezing bilaterally.   Musculoskeletal: Normal range of motion.  Neurological: She is alert and oriented to person, place, and time.  Skin: Skin is warm and dry.  Psychiatric: She has a normal mood and affect. Her behavior is normal.  Nursing note and vitals reviewed.   ED Course  Procedures (including critical care time) DIAGNOSTIC STUDIES: Oxygen Saturation is 98% on RA, normal by my interpretation.    COORDINATION OF CARE: 1:59 AM-Discussed treatment plan with pt at bedside and pt agreed to plan.     Labs Review Labs Reviewed  BASIC METABOLIC PANEL - Abnormal; Notable for the following:    BUN 5 (*)    Calcium 8.3 (*)    GFR calc non Af Amer 78 (*)    Anion gap 4 (*)    All other components within normal limits  CBC  I-STAT TROPOININ, ED    Imaging Review Dg Chest 2 View  12/14/2014   CLINICAL DATA:  Acute onset of shortness of breath. Initial encounter.  EXAM: CHEST  2 VIEW  COMPARISON:  Chest radiograph performed 11/15/2014  FINDINGS: The lungs are well-aerated and clear. There is no evidence of focal opacification, pleural effusion or pneumothorax.  The heart is normal in size; the mediastinal contour is within normal limits. No acute osseous abnormalities are seen.  IMPRESSION: No acute cardiopulmonary process seen.   Electronically Signed   By: Garald Balding M.D.   On: 12/14/2014 02:06     EKG Interpretation None       MDM   Final diagnoses:  Asthma exacerbation    Patient presents with complaints of sudden onset of wheezing. She has a history of asthma and this is consistent with her prior asthma attacks. She is feeling better albuterol nebulizer treatments. She will be treated with oral prednisone and refills of her albuterol and Atrovent. Her chest x-ray does not reveal any acute cardiopulmonary abnormality. Her vital signs are stable, she appears in no distress, and I believe appropriate for discharge.   I personally performed the services described in this documentation, which  was scribed in my presence. The recorded information has been reviewed and is accurate.       Veryl Speak, MD 12/14/14 [phone]

## 2014-12-14 NOTE — ED Notes (Signed)
Pt getting dressed.

## 2014-12-14 NOTE — Discharge Instructions (Signed)
Prednisone and Atrovent as prescribed.  Albuterol inhaler: 2 puffs every 4 hours as needed for wheezing.   Asthma Attack Prevention Although there is no way to prevent asthma from starting, you can take steps to control the disease and reduce its symptoms. Learn about your asthma and how to control it. Take an active role to control your asthma by working with your health care provider to create and follow an asthma action plan. An asthma action plan guides you in:  Taking your medicines properly.  Avoiding things that set off your asthma or make your asthma worse (asthma triggers).  Tracking your level of asthma control.  Responding to worsening asthma.  Seeking emergency care when needed. To track your asthma, keep records of your symptoms, check your peak flow number using a handheld device that shows how well air moves out of your lungs (peak flow meter), and get regular asthma checkups.  WHAT ARE SOME WAYS TO PREVENT AN ASTHMA ATTACK?  Take medicines as directed by your health care provider.  Keep track of your asthma symptoms and level of control.  With your health care provider, write a detailed plan for taking medicines and managing an asthma attack. Then be sure to follow your action plan. Asthma is an ongoing condition that needs regular monitoring and treatment.  Identify and avoid asthma triggers. Many outdoor allergens and irritants (such as pollen, mold, cold air, and air pollution) can trigger asthma attacks. Find out what your asthma triggers are and take steps to avoid them.  Monitor your breathing. Learn to recognize warning signs of an attack, such as coughing, wheezing, or shortness of breath. Your lung function may decrease before you notice any signs or symptoms, so regularly measure and record your peak airflow with a home peak flow meter.  Identify and treat attacks early. If you act quickly, you are less likely to have a severe attack. You will also need less  medicine to control your symptoms. When your peak flow measurements decrease and alert you to an upcoming attack, take your medicine as instructed and immediately stop any activity that may have triggered the attack. If your symptoms do not improve, get medical help.  Pay attention to increasing quick-relief inhaler use. If you find yourself relying on your quick-relief inhaler, your asthma is not under control. See your health care provider about adjusting your treatment. WHAT CAN MAKE MY SYMPTOMS WORSE? A number of common things can set off or make your asthma symptoms worse and cause temporary increased inflammation of your airways. Keep track of your asthma symptoms for several weeks, detailing all the environmental and emotional factors that are linked with your asthma. When you have an asthma attack, go back to your asthma diary to see which factor, or combination of factors, might have contributed to it. Once you know what these factors are, you can take steps to control many of them. If you have allergies and asthma, it is important to take asthma prevention steps at home. Minimizing contact with the substance to which you are allergic will help prevent an asthma attack. Some triggers and ways to avoid these triggers are: Animal Dander:  Some people are allergic to the flakes of skin or dried saliva from animals with fur or feathers.   There is no such thing as a hypoallergenic dog or cat breed. All dogs or cats can cause allergies, even if they don't shed.  Keep these pets out of your home.  If you are not  able to keep a pet outdoors, keep the pet out of your bedroom and other sleeping areas at all times, and keep the door closed.  Remove carpets and furniture covered with cloth from your home. If that is not possible, keep the pet away from fabric-covered furniture and carpets. Dust Mites: Many people with asthma are allergic to dust mites. Dust mites are tiny bugs that are found in every  home in mattresses, pillows, carpets, fabric-covered furniture, bedcovers, clothes, stuffed toys, and other fabric-covered items.   Cover your mattress in a special dust-proof cover.  Cover your pillow in a special dust-proof cover, or wash the pillow each week in hot water. Water must be hotter than 130 F (54.4 C) to kill dust mites. Cold or warm water used with detergent and bleach can also be effective.  Wash the sheets and blankets on your bed each week in hot water.  Try not to sleep or lie on cloth-covered cushions.  Call ahead when traveling and ask for a smoke-free hotel room. Bring your own bedding and pillows in case the hotel only supplies feather pillows and down comforters, which may contain dust mites and cause asthma symptoms.  Remove carpets from your bedroom and those laid on concrete, if you can.  Keep stuffed toys out of the bed, or wash the toys weekly in hot water or cooler water with detergent and bleach. Cockroaches: Many people with asthma are allergic to the droppings and remains of cockroaches.   Keep food and garbage in closed containers. Never leave food out.  Use poison baits, traps, powders, gels, or paste (for example, boric acid).  If a spray is used to kill cockroaches, stay out of the room until the odor goes away. Indoor Mold:  Fix leaky faucets, pipes, or other sources of water that have mold around them.  Clean floors and moldy surfaces with a fungicide or diluted bleach.  Avoid using humidifiers, vaporizers, or swamp coolers. These can spread molds through the air. Pollen and Outdoor Mold:  When pollen or mold spore counts are high, try to keep your windows closed.  Stay indoors with windows closed from late morning to afternoon. Pollen and some mold spore counts are highest at that time.  Ask your health care provider whether you need to take anti-inflammatory medicine or increase your dose of the medicine before your allergy season  starts. Other Irritants to Avoid:  Tobacco smoke is an irritant. If you smoke, ask your health care provider how you can quit. Ask family members to quit smoking, too. Do not allow smoking in your home or car.  If possible, do not use a wood-burning stove, kerosene heater, or fireplace. Minimize exposure to all sources of smoke, including incense, candles, fires, and fireworks.  Try to stay away from strong odors and sprays, such as perfume, talcum powder, hair spray, and paints.  Decrease humidity in your home and use an indoor air cleaning device. Reduce indoor humidity to below 60%. Dehumidifiers or central air conditioners can do this.  Decrease house dust exposure by changing furnace and air cooler filters frequently.  Try to have someone else vacuum for you once or twice a week. Stay out of rooms while they are being vacuumed and for a short while afterward.  If you vacuum, use a dust mask from a hardware store, a double-layered or microfilter vacuum cleaner bag, or a vacuum cleaner with a HEPA filter.  Sulfites in foods and beverages can be irritants. Do not  drink beer or wine or eat dried fruit, processed potatoes, or shrimp if they cause asthma symptoms.  Cold air can trigger an asthma attack. Cover your nose and mouth with a scarf on cold or windy days.  Several health conditions can make asthma more difficult to manage, including a runny nose, sinus infections, reflux disease, psychological stress, and sleep apnea. Work with your health care provider to manage these conditions.  Avoid close contact with people who have a respiratory infection such as a cold or the flu, since your asthma symptoms may get worse if you catch the infection. Wash your hands thoroughly after touching items that may have been handled by people with a respiratory infection.  Get a flu shot every year to protect against the flu virus, which often makes asthma worse for days or weeks. Also get a pneumonia  shot if you have not previously had one. Unlike the flu shot, the pneumonia shot does not need to be given yearly. Medicines:  Talk to your health care provider about whether it is safe for you to take aspirin or non-steroidal anti-inflammatory medicines (NSAIDs). In a small number of people with asthma, aspirin and NSAIDs can cause asthma attacks. These medicines must be avoided by people who have known aspirin-sensitive asthma. It is important that people with aspirin-sensitive asthma read labels of all over-the-counter medicines used to treat pain, colds, coughs, and fever.  Beta-blockers and ACE inhibitors are other medicines you should discuss with your health care provider. HOW CAN I FIND OUT WHAT I AM ALLERGIC TO? Ask your asthma health care provider about allergy skin testing or blood testing (the RAST test) to identify the allergens to which you are sensitive. If you are found to have allergies, the most important thing to do is to try to avoid exposure to any allergens that you are sensitive to as much as possible. Other treatments for allergies, such as medicines and allergy shots (immunotherapy) are available.  CAN I EXERCISE? Follow your health care provider's advice regarding asthma treatment before exercising. It is important to maintain a regular exercise program, but vigorous exercise or exercise in cold, humid, or dry environments can cause asthma attacks, especially for those people who have exercise-induced asthma. Document Released: 08/22/2009 Document Revised: 09/08/2013 Document Reviewed: 03/11/2013 [facility] Patient Information 2015 [place], [place]. This information is not intended to replace advice given to you by your health care provider. Make sure you discuss any questions you have with your health care provider.

## 2014-12-14 NOTE — ED Notes (Signed)
Pt reports that she began to feel short of breath about an hour ago- hx of asthma, pt wheezing upon arrival to triage room.  Pt is out of home inhaler.  Admits to chest pain in the center of her chest that started at the same time.

## 2014-12-22 ENCOUNTER — Encounter: Payer: Self-pay | Admitting: Internal Medicine

## 2014-12-22 ENCOUNTER — Ambulatory Visit: Payer: Self-pay | Attending: Internal Medicine | Admitting: Internal Medicine

## 2014-12-22 VITALS — BP 114/79 | HR 96 | Temp 98.0°F | Resp 16 | Ht 61.0 in | Wt 155.0 lb

## 2014-12-22 DIAGNOSIS — F329 Major depressive disorder, single episode, unspecified: Secondary | ICD-10-CM

## 2014-12-22 DIAGNOSIS — J4530 Mild persistent asthma, uncomplicated: Secondary | ICD-10-CM

## 2014-12-22 DIAGNOSIS — F32A Depression, unspecified: Secondary | ICD-10-CM

## 2014-12-22 DIAGNOSIS — R413 Other amnesia: Secondary | ICD-10-CM

## 2014-12-22 MED ORDER — FLUOXETINE HCL 20 MG PO CAPS: 20.0000 mg | ORAL_CAPSULE | Freq: Every day | ORAL | Status: DC

## 2014-12-22 MED ORDER — ALBUTEROL SULFATE HFA 108 (90 BASE) MCG/ACT IN AERS: 2.0000 | INHALATION_SPRAY | Freq: Every day | RESPIRATORY_TRACT | Status: DC | PRN

## 2014-12-22 MED ORDER — FLUTICASONE-SALMETEROL 500-50 MCG/DOSE IN AEPB: 1.0000 | INHALATION_SPRAY | Freq: Two times a day (BID) | RESPIRATORY_TRACT | Status: DC

## 2014-12-22 NOTE — Patient Instructions (Signed)

## 2014-12-22 NOTE — Progress Notes (Signed)
Patient here to establish care and discuss asthma. Patient feels it is well controlled as long as she has her medication Patient was in ED for exacerbation last week Patient takes Prozac but has been out for 2 months and says she is very forgetful when she is out of medication Pap smear longer than three years Patient needs mammogram Patient has irregular menstrual periods and has had a Mirena since 2011 and is interested in discussing another form of birth control-patient is not sexually active

## 2014-12-22 NOTE — Progress Notes (Signed)
Patient ID: [name], female   DOB: 01-Oct-1970, 44 y.o.   MRN: [id number]  VOJ:[id number]  [id number]:[id number]  DOB - 10-Nov-1970  CC:  Chief Complaint  Patient presents with  . Establish Care  . Asthma  . Depression       HPI: [name] is a 44 y.o. female here today to establish medical care.  Patient has history of asthma, fibroids, and depression. She presents today with concerns of SOB, nighttime 2-3 times per week, and wheezing. She does have a history of asthma but reports that she has been unable to afford her advair inhaler. She has only been using her rescue inhaler to get her by. Patient also reports that she has been off her Prozac for over one year. She states that after her mother passed away in 12/17/2004 she had a episode that required her to stay in the behavioral health hospital. She states that at that time she was diagnosed with depression but has not had a regular PCP to help her keep up with her medications. She does not currently have a psychiatrist. She reports that she is beginning to forget simple task, phone numbers, and new information. Her memory loss has become progressively worse.   Patient has No headache, No chest pain, No abdominal pain - No Nausea, No new weakness tingling or numbness.  Allergies  Allergen Reactions  . Bupropion Hcl Er (Sr) Hives and Other (See Comments)    The patient has an entire page of reactions that can be caused by the medication, but none that she has suffered.  . Fruit & Vegetable Daily [Nutritional Supplements] Other (See Comments)    "all fruits causes bumps and weird feeling in her mouth"  . Penicillins Hives  . Pollen Extract-Tree Extract Other (See Comments)    Allergies  . Other Rash    "bugs" roaches   Past Medical History  Diagnosis Date  . Allergy     pollen, pcn  . Asthma   . Fibroids   . History of ovarian cyst   . Sickle cell anemia     has the trait  . Menometrorrhagia   . Sickle cell trait   . Depression    . Medical history non-contributory     sickle cell   Current Outpatient Prescriptions on File Prior to Visit  Medication Sig Dispense Refill  . albuterol (PROVENTIL HFA;VENTOLIN HFA) 108 (90 BASE) MCG/ACT inhaler Inhale 2 puffs into the lungs daily as needed for shortness of breath.    Marland Kitchen FLUoxetine (PROZAC) 20 MG capsule Take 1 capsule (20 mg total) by mouth daily. 15 capsule 1  . Fluticasone-Salmeterol (ADVAIR) 500-50 MCG/DOSE AEPB Inhale 1 puff into the lungs every 12 (twelve) hours. 60 each 0  . HYDROcodone-acetaminophen (NORCO/VICODIN) 5-325 MG per tablet Take 1 tablet by mouth every 6 (six) hours as needed. 10 tablet 0  . ipratropium (ATROVENT HFA) 17 MCG/ACT inhaler Inhale 2 puffs into the lungs 2 (two) times daily. 1 Inhaler 12  . levocetirizine (XYZAL) 5 MG tablet Take 1 tablet (5 mg total) by mouth every evening. 30 tablet 0  . naproxen (NAPROSYN) 500 MG tablet Take 500 mg by mouth 2 (two) times daily with a meal.    . predniSONE (DELTASONE) 10 MG tablet Take 2 tablets (20 mg total) by mouth 2 (two) times daily. 20 tablet 0  . traZODone (DESYREL) 50 MG tablet Take 1 tablet (50 mg total) by mouth at bedtime as needed for sleep. 30 tablet 0  No current facility-administered medications on file prior to visit.   Family History  Problem Relation Age of Onset  . Cancer Mother     colon cancer  . Hypertension Mother   . Heart disease Father   . Cancer Father     cancer  . Other Neg Hx    History   Social History  . Marital Status: Divorced    Spouse Name: N/A  . Number of Children: N/A  . Years of Education: N/A   Occupational History  . Not on file.   Social History Main Topics  . Smoking status: Former Smoker -- 0.25 packs/day    Types: Cigarettes  . Smokeless tobacco: Never Used     Comment: cessation info given  . Alcohol Use: No  . Drug Use: No  . Sexual Activity:    Partners: Male    Patent examiner Protection: IUD, None   Other Topics Concern  . Not on  file   Social History Narrative    Review of Systems  Constitutional: Negative for fever, chills and diaphoresis.  Respiratory: Positive for cough, shortness of breath and wheezing. Negative for sputum production.   Cardiovascular: Negative for chest pain and palpitations.  Psychiatric/Behavioral: Positive for depression and memory loss. Negative for suicidal ideas and substance abuse. The patient is not nervous/anxious.   All other systems reviewed and are negative.  .    Objective:   Filed Vitals:   12/22/14 1558  BP: 114/79  Pulse: 96  Temp: 98 F (36.7 C)  Resp: 16    Physical Exam: Constitutional: Patient appears well-developed and well-nourished. No distress. HENT: Normocephalic, atraumatic, External right and left ear normal. Oropharynx is clear and moist.  Eyes: Conjunctivae and EOM are normal. PERRLA, no scleral icterus. Neck: Normal ROM. Neck supple. No JVD. No tracheal deviation. No thyromegaly. CVS: RRR, S1/S2 +, no murmurs, no gallops, no carotid bruit.  Pulmonary: Effort and breath sounds normal, no stridor, rhonchi, wheezes, rales.  Abdominal: Soft. BS +, no distension, tenderness, rebound or guarding.  Neuro: Alert. Normal reflexes, muscle tone coordination. No cranial nerve deficit. Skin: Skin is warm and dry. No rash noted. Not diaphoretic. No erythema. No pallor. Psychiatric: Normal mood and affect. Behavior, judgment, thought content normal.  Lab Results  Component Value Date   WBC 7.1 12/14/2014   HGB 12.7 12/14/2014   HCT 36.8 12/14/2014   MCV 80.0 12/14/2014   PLT 299 12/14/2014   Lab Results  Component Value Date   CREATININE 0.88 12/14/2014   BUN 5* 12/14/2014   NA 138 12/14/2014   K 3.9 12/14/2014   CL 108 12/14/2014   CO2 26 12/14/2014    Lab Results  Component Value Date   HGBA1C 4.6 02/25/2013   Lipid Panel     Component Value Date/Time   CHOL 143 02/25/2013 0630   TRIG 48 02/25/2013 0630   HDL 54 02/25/2013 0630   CHOLHDL  2.6 02/25/2013 0630   VLDL 10 02/25/2013 0630   LDLCALC 79 02/25/2013 0630       Assessment and plan:   [name] was seen today for establish care, asthma and depression.  Diagnoses and all orders for this visit:  Asthma, mild persistent, uncomplicated Orders: -     albuterol (PROVENTIL HFA;VENTOLIN HFA) 108 (90 BASE) MCG/ACT inhaler; Inhale 2 puffs into the lungs daily as needed for shortness of breath. -     Fluticasone-Salmeterol (ADVAIR) 500-50 MCG/DOSE AEPB; Inhale 1 puff into the lungs every 12 (twelve)  hours. Refilled patients advair and told her about the pharmacy medication assistance program.   Depression Orders: -     FLUoxetine (PROZAC) 20 MG capsule; Take 1 capsule (20 mg total) by mouth daily. Will refill patients prozac but I have explained to her the need for a regular psychiatrist due to other concerns. She has been given information for [employer]. I have explained that they will manage all psych medications from now on.  Explained signs and symptoms that should warrant immediate attention.  Patient verbalized understanding with teach back used.  Memory Loss Will defer to psych first. If after evaluation and proper diagnoses she continues to have this problem then I will send referral to Neurology.    Return in about 1 week (around 12/29/2014) for pap/breast exam.    Chari Manning, [facility] [phone] 12/22/2014, 4:25 PM

## 2014-12-26 NOTE — Addendum Note (Signed)
Addended by: Chari Manning A on: 12/26/2014 08:37 PM   Modules accepted: Level of Service

## 2014-12-29 ENCOUNTER — Encounter: Payer: Self-pay | Admitting: Internal Medicine

## 2014-12-29 ENCOUNTER — Ambulatory Visit: Payer: Self-pay | Attending: Internal Medicine | Admitting: Internal Medicine

## 2014-12-29 VITALS — BP 101/68 | HR 59 | Temp 98.5°F | Resp 17 | Ht 61.0 in | Wt 160.0 lb

## 2014-12-29 DIAGNOSIS — Z791 Long term (current) use of non-steroidal anti-inflammatories (NSAID): Secondary | ICD-10-CM | POA: Insufficient documentation

## 2014-12-29 DIAGNOSIS — F329 Major depressive disorder, single episode, unspecified: Secondary | ICD-10-CM | POA: Insufficient documentation

## 2014-12-29 DIAGNOSIS — J45909 Unspecified asthma, uncomplicated: Secondary | ICD-10-CM | POA: Insufficient documentation

## 2014-12-29 DIAGNOSIS — Z8742 Personal history of other diseases of the female genital tract: Secondary | ICD-10-CM | POA: Insufficient documentation

## 2014-12-29 DIAGNOSIS — Z124 Encounter for screening for malignant neoplasm of cervix: Secondary | ICD-10-CM

## 2014-12-29 DIAGNOSIS — Z01419 Encounter for gynecological examination (general) (routine) without abnormal findings: Secondary | ICD-10-CM | POA: Insufficient documentation

## 2014-12-29 DIAGNOSIS — Z30431 Encounter for routine checking of intrauterine contraceptive device: Secondary | ICD-10-CM | POA: Insufficient documentation

## 2014-12-29 DIAGNOSIS — Z7951 Long term (current) use of inhaled steroids: Secondary | ICD-10-CM | POA: Insufficient documentation

## 2014-12-29 DIAGNOSIS — R102 Pelvic and perineal pain: Secondary | ICD-10-CM | POA: Insufficient documentation

## 2014-12-29 DIAGNOSIS — Z975 Presence of (intrauterine) contraceptive device: Secondary | ICD-10-CM

## 2014-12-29 DIAGNOSIS — Z1239 Encounter for other screening for malignant neoplasm of breast: Secondary | ICD-10-CM

## 2014-12-29 DIAGNOSIS — Z87891 Personal history of nicotine dependence: Secondary | ICD-10-CM | POA: Insufficient documentation

## 2014-12-29 LAB — LIPID PANEL
CHOL/HDL RATIO: 3.3 ratio
CHOLESTEROL: 147 mg/dL (ref 0–200)
HDL: 44 mg/dL — ABNORMAL LOW (ref >=46)
LDL Cholesterol: 91 mg/dL (ref 0–99)
Triglycerides: 58 mg/dL (ref <150)
VLDL: 12 mg/dL (ref 0–40)

## 2014-12-29 LAB — HEMOGLOBIN A1C
HEMOGLOBIN A1C: 4.9 % (ref <5.7)
MEAN PLASMA GLUCOSE: 94 mg/dL (ref <117)

## 2014-12-29 LAB — TSH: TSH: 2.562 u[IU]/mL (ref 0.350–4.500)

## 2014-12-29 NOTE — Progress Notes (Signed)
Pt is here today for a pap and a breast exam.

## 2014-12-29 NOTE — Progress Notes (Signed)
Patient ID: [name], female   DOB: Mar 21, 1971, 44 y.o.   MRN: [id number]  CC: pap/breast exam   HPI: [name] is a 44 y.o. female here today for a follow up visit.  Patient has past medical history of fibroids and ovarian cyst. She presents today for a pelvic exam and breast exam. She states that she has had some pelvic pain but is unsure if it is from her IUD or the cyst. She would liek to know if her IUD is still in place. She denies vaginal discharge, itch, odor, lesions, or dysuria. She has not noticed any breast masses or skin changes. Patient is due for mammogram.   Patient has No headache, No chest pain, No Nausea, No new weakness tingling or numbness, No Cough - SOB.  Allergies  Allergen Reactions  . Bupropion Hcl Er (Sr) Hives and Other (See Comments)    The patient has an entire page of reactions that can be caused by the medication, but none that she has suffered.  . Fruit & Vegetable Daily [Nutritional Supplements] Other (See Comments)    "all fruits causes bumps and weird feeling in her mouth"  . Penicillins Hives  . Pollen Extract-Tree Extract Other (See Comments)    Allergies  . Other Rash    "bugs" roaches   Past Medical History  Diagnosis Date  . Allergy     pollen, pcn  . Asthma   . Fibroids   . History of ovarian cyst   . Sickle cell anemia     has the trait  . Menometrorrhagia   . Sickle cell trait   . Depression   . Medical history non-contributory     sickle cell   Current Outpatient Prescriptions on File Prior to Visit  Medication Sig Dispense Refill  . albuterol (PROVENTIL HFA;VENTOLIN HFA) 108 (90 BASE) MCG/ACT inhaler Inhale 2 puffs into the lungs daily as needed for shortness of breath. 1 Inhaler 4  . FLUoxetine (PROZAC) 20 MG capsule Take 1 capsule (20 mg total) by mouth daily. 30 capsule 2  . Fluticasone-Salmeterol (ADVAIR) 500-50 MCG/DOSE AEPB Inhale 1 puff into the lungs every 12 (twelve) hours. 60 each 4  . HYDROcodone-acetaminophen  (NORCO/VICODIN) 5-325 MG per tablet Take 1 tablet by mouth every 6 (six) hours as needed. 10 tablet 0  . ipratropium (ATROVENT HFA) 17 MCG/ACT inhaler Inhale 2 puffs into the lungs 2 (two) times daily. 1 Inhaler 12  . levocetirizine (XYZAL) 5 MG tablet Take 1 tablet (5 mg total) by mouth every evening. 30 tablet 0  . naproxen (NAPROSYN) 500 MG tablet Take 500 mg by mouth 2 (two) times daily with a meal.    . predniSONE (DELTASONE) 10 MG tablet Take 2 tablets (20 mg total) by mouth 2 (two) times daily. 20 tablet 0  . traZODone (DESYREL) 50 MG tablet Take 1 tablet (50 mg total) by mouth at bedtime as needed for sleep. 30 tablet 0   No current facility-administered medications on file prior to visit.   Family History  Problem Relation Age of Onset  . Cancer Mother     colon cancer  . Hypertension Mother   . Heart disease Father   . Cancer Father     cancer  . Other Neg Hx    History   Social History  . Marital Status: Divorced    Spouse Name: N/A  . Number of Children: N/A  . Years of Education: N/A   Occupational History  . Not  on file.   Social History Main Topics  . Smoking status: Former Smoker -- 0.25 packs/day    Types: Cigarettes  . Smokeless tobacco: Never Used     Comment: cessation info given  . Alcohol Use: No  . Drug Use: No  . Sexual Activity:    Partners: Male    Patent examiner Protection: IUD, None   Other Topics Concern  . Not on file   Social History Narrative    Review of Systems: See HPI    Objective:   Filed Vitals:   12/29/14 0929  BP: 101/68  Pulse: 59  Temp: 98.5 F (36.9 C)  Resp: 17    Physical Exam  Pulmonary/Chest: Right breast exhibits no mass. Left breast exhibits no mass.  Genitourinary: Uterus normal. No breast tenderness or discharge. Cervix exhibits no motion tenderness, no discharge and no friability. Right adnexum displays no tenderness. Left adnexum displays no tenderness. There is bleeding in the vagina. No vaginal  discharge found.  Patient experienced pain with speculum insertion due to extreme tightness. IUD strings visualized. Very foul odor from vagina, with old blood in vaginal cavity   Lymphadenopathy:       Right: No inguinal adenopathy present.       Left: No inguinal adenopathy present.     Lab Results  Component Value Date   WBC 7.1 12/14/2014   HGB 12.7 12/14/2014   HCT 36.8 12/14/2014   MCV 80.0 12/14/2014   PLT 299 12/14/2014   Lab Results  Component Value Date   CREATININE 0.88 12/14/2014   BUN 5* 12/14/2014   NA 138 12/14/2014   K 3.9 12/14/2014   CL 108 12/14/2014   CO2 26 12/14/2014    Lab Results  Component Value Date   HGBA1C 4.6 02/25/2013   Lipid Panel     Component Value Date/Time   CHOL 143 02/25/2013 0630   TRIG 48 02/25/2013 0630   HDL 54 02/25/2013 0630   CHOLHDL 2.6 02/25/2013 0630   VLDL 10 02/25/2013 0630   LDLCALC 79 02/25/2013 0630       Assessment and plan:   Anvi was seen today for follow-up.  Diagnoses and all orders for this visit:  Papanicolaou smear Orders: -     Cytology - PAP [place] -     Lipid panel -     TSH -     Hemoglobin A1C -     Vitamin D, 25-hydroxy -     RPR -     HIV antibody (with reflex) Will check patient for infections due to foul smell--r/o out cervical cancer or STD's  IUD (intrauterine device) in place Strings visualized   Breast cancer screening Orders: -     MM Digital Screening; Future I have given patient number to [facility] program for mammogram assistance   She may follow up as needed.      Chari Manning, NP-C [facility] [phone] 12/29/2014, 9:47 AM

## 2014-12-29 NOTE — Patient Instructions (Signed)
Please call Rolena Infante, [phone],  with the BCCCP (breast and cervical cancer control program) at the [facility] to set up an appointment to verify eligibility for a breast exam, mammogram, ultrasound. If you qualify this will be set up at [facility]

## 2014-12-30 LAB — RPR

## 2014-12-30 LAB — CERVICOVAGINAL ANCILLARY ONLY
CHLAMYDIA, DNA PROBE: NEGATIVE
Neisseria Gonorrhea: NEGATIVE
WET PREP (BD AFFIRM): POSITIVE — AB

## 2014-12-30 LAB — HIV ANTIBODY (ROUTINE TESTING W REFLEX): HIV: NONREACTIVE

## 2014-12-30 LAB — VITAMIN D 25 HYDROXY (VIT D DEFICIENCY, FRACTURES)

## 2014-12-30 LAB — CYTOLOGY - PAP

## 2015-01-07 ENCOUNTER — Telehealth: Payer: Self-pay | Admitting: *Deleted

## 2015-01-07 MED ORDER — ERGOCALCIFEROL 1.25 MG (50000 UT) PO CAPS: 50000.0000 [IU] | ORAL_CAPSULE | ORAL | Status: DC

## 2015-01-07 MED ORDER — METRONIDAZOLE 500 MG PO TABS: 500.0000 mg | ORAL_TABLET | Freq: Two times a day (BID) | ORAL | Status: AC

## 2015-01-07 NOTE — Telephone Encounter (Signed)
-----   Message from Lance Bosch, NP sent at 01/05/2015 12:06 AM EDT ----- Patient positive for Bacterial Vaginosis . Please explain this is not a STD, but a imbalance of the vaginal pH. Please send Flagyl 500 mg BID for 7 days. No refills, no alcohol while on this medication. Pap negative for malignancies..will repeat in 3 years  Vitamin D is extremely low. Please send drisdol 50,000 IU to take once weekly for 14 weeks. 14 tablets no refills. Please encourage patient to take, this will help with bone health and may help with energy as well

## 2015-01-07 NOTE — Telephone Encounter (Signed)
Gave patient results of pap smear and blood work.  Patient had no questions and I sent Rx to our pharmacy for patient to pick up today.

## 2015-01-07 NOTE — Telephone Encounter (Signed)
Left message with family member to return my call. 

## 2015-01-20 ENCOUNTER — Ambulatory Visit: Payer: Self-pay | Attending: Internal Medicine | Admitting: Clinical

## 2015-01-20 DIAGNOSIS — F4321 Adjustment disorder with depressed mood: Secondary | ICD-10-CM

## 2015-01-20 NOTE — Progress Notes (Signed)
ASSESSMENT: Pt currently experiencing symptoms of depression and financial stressors as a result of homelessness. She needs to continue to take her medications as prescribed, F/U with financial counselor, PCP and [facility]. She would benefit from continuing to attend support group, psychoeducation and supportive counseling to learn to cope with symptoms of depression; would also benefit from starting a journal.  Stage of Change: action  PT AGREES TO FOLLOWING TREATMENT PLAN: 1. F/U with behavioral health consultant in 1 week, for further assessment of symptoms of depression 2. Psychiatric Medications: Prozac, Desyrel, Take as prescribed 3. Behavioral recommendation(s):   -Go to [facility] on 01-21-15 for homeless verification letter -Bring homeless verification letter back to Development worker, community at [employer] for Rx Card, to obtain prescriptions -Continue attending classes at the [facility] -Consider reading through educational materials about coping with depression -Consider keeping a journal to begin telling her story, focusing at first on the positive aspects of her past. She will put in a couple of words to remind her of the negative (to put in later, when she feels stronger), but will skip over those parts until all the positive parts have been written first.   SUBJECTIVE: Pt. referred by Financial Counseling for community resources:  Pt. here for referral regarding community resources .  Pt. reports the following symptoms/concerns: Pt states that she is concerned about not being able to obtain her needed medications; she says she is not sure how long she needs to be on depression medication, and wants to know if there is anything else besides medication that she could do to help feel less depressed. Pt states that what she really wants to do is to be able to tell her story, so that she can help others who are going through what she has gone through in the past. She does take classes at the [facility] to help deal with depression.  Duration of problem: >10 years Severity: moderate  OBJECTIVE: Orientation & Cognition: Oriented x3. Thought processes normal and appropriate to situation. Mood: appropriate. Affect: appropriate Appearance: appropriate Risk of harm to self or others: no risk of harm to self or others Substance use: none Psychiatric medication use: Unchanged from prior contact. Assessments administered: none  Diagnosis: Major depressive disorder, recurrent, unspecified CPT Code: F33.9 -------------------------------------------- Other(s) present in the room: none  Time spent with patient in exam room: 20 minutes

## 2015-01-25 ENCOUNTER — Other Ambulatory Visit: Payer: Self-pay | Admitting: Internal Medicine

## 2015-01-25 ENCOUNTER — Ambulatory Visit: Payer: Self-pay | Attending: Internal Medicine

## 2015-01-25 DIAGNOSIS — J4530 Mild persistent asthma, uncomplicated: Secondary | ICD-10-CM

## 2015-01-25 MED ORDER — FLUTICASONE-SALMETEROL 250-50 MCG/DOSE IN AEPB: 1.0000 | INHALATION_SPRAY | Freq: Two times a day (BID) | RESPIRATORY_TRACT | Status: DC

## 2015-02-09 ENCOUNTER — Other Ambulatory Visit: Payer: Self-pay | Admitting: Internal Medicine

## 2015-02-09 DIAGNOSIS — J4530 Mild persistent asthma, uncomplicated: Secondary | ICD-10-CM

## 2015-02-09 MED ORDER — FLUTICASONE-SALMETEROL 500-50 MCG/DOSE IN AEPB: 1.0000 | INHALATION_SPRAY | Freq: Two times a day (BID) | RESPIRATORY_TRACT | Status: DC

## 2015-02-09 MED ORDER — ALBUTEROL SULFATE HFA 108 (90 BASE) MCG/ACT IN AERS: 2.0000 | INHALATION_SPRAY | Freq: Every day | RESPIRATORY_TRACT | Status: DC | PRN

## 2015-02-25 ENCOUNTER — Other Ambulatory Visit: Payer: Self-pay | Admitting: Internal Medicine

## 2015-03-18 ENCOUNTER — Ambulatory Visit: Payer: Self-pay | Attending: Internal Medicine | Admitting: Internal Medicine

## 2015-03-18 ENCOUNTER — Other Ambulatory Visit: Payer: Self-pay

## 2015-03-18 VITALS — BP 110/78 | HR 87 | Temp 98.4°F | Resp 16 | Wt 151.2 lb

## 2015-03-18 DIAGNOSIS — Z720 Tobacco use: Secondary | ICD-10-CM

## 2015-03-18 DIAGNOSIS — F329 Major depressive disorder, single episode, unspecified: Secondary | ICD-10-CM

## 2015-03-18 DIAGNOSIS — M25561 Pain in right knee: Secondary | ICD-10-CM

## 2015-03-18 DIAGNOSIS — F17200 Nicotine dependence, unspecified, uncomplicated: Secondary | ICD-10-CM

## 2015-03-18 DIAGNOSIS — N926 Irregular menstruation, unspecified: Secondary | ICD-10-CM

## 2015-03-18 DIAGNOSIS — J4530 Mild persistent asthma, uncomplicated: Secondary | ICD-10-CM

## 2015-03-18 DIAGNOSIS — Z8041 Family history of malignant neoplasm of ovary: Secondary | ICD-10-CM

## 2015-03-18 DIAGNOSIS — F32A Depression, unspecified: Secondary | ICD-10-CM

## 2015-03-18 MED ORDER — FLUTICASONE-SALMETEROL 500-50 MCG/DOSE IN AEPB: 1.0000 | INHALATION_SPRAY | Freq: Two times a day (BID) | RESPIRATORY_TRACT | Status: DC

## 2015-03-18 MED ORDER — FLUOXETINE HCL 40 MG PO CAPS: 40.0000 mg | ORAL_CAPSULE | Freq: Every day | ORAL | Status: DC

## 2015-03-18 MED ORDER — ALBUTEROL SULFATE HFA 108 (90 BASE) MCG/ACT IN AERS: 2.0000 | INHALATION_SPRAY | Freq: Every day | RESPIRATORY_TRACT | Status: DC | PRN

## 2015-03-18 MED ORDER — FLUTICASONE-SALMETEROL 250-50 MCG/DOSE IN AEPB: 1.0000 | INHALATION_SPRAY | Freq: Two times a day (BID) | RESPIRATORY_TRACT | Status: DC

## 2015-03-18 NOTE — Progress Notes (Signed)
  Pt is here to discuss her Asthma and Depression.

## 2015-03-18 NOTE — Patient Instructions (Signed)
Try Melatonin for sleep. Can get it at [facility].

## 2015-03-18 NOTE — Progress Notes (Signed)
Patient ID: [name], female   DOB: [birth year], 44 y.o.   MRN: [id number]  CC: asthma/depression f/u   HPI: [name] is a 44 y.o. female here today for a follow up visit.  Patient has past medical history of asthma and depression. Patient reports that she has bene unable to get Advair 500-50 from pharmacy and noticed that she has been having to use her albuterol inhaler at least 5 times per day due to SOB. She currently still smokes 2 cigarettes per day and has been attempting to quit altogether.  She complains that she has been on prozac for 6 years and feels like it is not helping as much as before. She is beginning to having crying spells and depressed mood more often. She feels confused and has difficulty falling and staying asleep. She has not tried anything for sleep for the past one month. She does admit to frequent daytime napping due to fatigue.  She is starting to feel lightheaded for 1 month. Feels like the room is spinning when she is sitting up and sometimes laying down. She denies syncopal episodes. LMP was 6.19. She reports that she has light cycles but they have been coming on ebery couple of weeks. She would like a GYN referral because her mother passed away from Ovarian Cancer and she would liek to be checked. Last pap was on 12/29/14 which was normal.  She also c/o right knee swelling and popping. Felt like water inside since last month. Denies injury.  Allergies  Allergen Reactions  . Bupropion Hcl Er (Sr) Hives and Other (See Comments)    The patient has an entire page of reactions that can be caused by the medication, but none that she has suffered.  . Fruit & Vegetable Daily [Nutritional Supplements] Other (See Comments)    "all fruits causes bumps and weird feeling in her mouth"  . Penicillins Hives  . Pollen Extract-Tree Extract Other (See Comments)    Allergies  . Other Rash    "bugs" roaches   Past Medical History  Diagnosis Date  . Allergy     pollen, pcn   . Asthma   . Fibroids   . History of ovarian cyst   . Sickle cell anemia     has the trait  . Menometrorrhagia   . Sickle cell trait   . Depression   . Medical history non-contributory     sickle cell   Current Outpatient Prescriptions on File Prior to Visit  Medication Sig Dispense Refill  . albuterol (PROVENTIL HFA;VENTOLIN HFA) 108 (90 BASE) MCG/ACT inhaler Inhale 2 puffs into the lungs daily as needed for shortness of breath. 3 Inhaler 3  . ergocalciferol (DRISDOL) 50000 UNITS capsule Take 1 capsule (50,000 Units total) by mouth once a week. 14 capsule 0  . FLUoxetine (PROZAC) 20 MG capsule Take 1 capsule (20 mg total) by mouth daily. 30 capsule 2  . ipratropium (ATROVENT HFA) 17 MCG/ACT inhaler Inhale 2 puffs into the lungs 2 (two) times daily. 1 Inhaler 12  . levocetirizine (XYZAL) 5 MG tablet Take 1 tablet (5 mg total) by mouth every evening. 30 tablet 0  . naproxen (NAPROSYN) 500 MG tablet Take 500 mg by mouth 2 (two) times daily with a meal.    . predniSONE (DELTASONE) 10 MG tablet Take 2 tablets (20 mg total) by mouth 2 (two) times daily. 20 tablet 0  . traZODone (DESYREL) 50 MG tablet Take 1 tablet (50 mg total) by mouth at  bedtime as needed for sleep. 30 tablet 0  . Fluticasone-Salmeterol (ADVAIR DISKUS) 500-50 MCG/DOSE AEPB Inhale 1 puff into the lungs 2 (two) times daily. (Patient not taking: Reported on 03/18/2015) 180 each 3  . HYDROcodone-acetaminophen (NORCO/VICODIN) 5-325 MG per tablet Take 1 tablet by mouth every 6 (six) hours as needed. (Patient not taking: Reported on 03/18/2015) 10 tablet 0   No current facility-administered medications on file prior to visit.   Family History  Problem Relation Age of Onset  . Cancer Mother     colon cancer  . Hypertension Mother   . Heart disease Father   . Cancer Father     cancer  . Other Neg Hx    History   Social History  . Marital Status: Divorced    Spouse Name: N/A  . Number of Children: N/A  . Years of  Education: N/A   Occupational History  . Not on file.   Social History Main Topics  . Smoking status: Former Smoker -- 0.25 packs/day    Types: Cigarettes  . Smokeless tobacco: Never Used     Comment: cessation info given  . Alcohol Use: No  . Drug Use: No  . Sexual Activity:    Partners: Male    Patent examiner Protection: IUD, None   Other Topics Concern  . Not on file   Social History Narrative    Review of Systems: See HPI   Objective:   Filed Vitals:   03/18/15 1213  BP: 110/78  Pulse: 87  Temp: 98.4 F (36.9 C)  Resp: 16    Physical Exam  Constitutional: She is oriented to person, place, and time.  Cardiovascular: Normal rate, regular rhythm and normal heart sounds.   Pulmonary/Chest: Effort normal and breath sounds normal.  Abdominal: She exhibits no distension and no mass. There is no tenderness.  Musculoskeletal: She exhibits no edema or tenderness.  Right knee crepitus/pain with ROM  Neurological: She is alert and oriented to person, place, and time.     Lab Results  Component Value Date   WBC 7.1 12/14/2014   HGB 12.7 12/14/2014   HCT 36.8 12/14/2014   MCV 80.0 12/14/2014   PLT 299 12/14/2014   Lab Results  Component Value Date   CREATININE 0.88 12/14/2014   BUN 5* 12/14/2014   NA 138 12/14/2014   K 3.9 12/14/2014   CL 108 12/14/2014   CO2 26 12/14/2014    Lab Results  Component Value Date   HGBA1C 4.9 12/29/2014   Lipid Panel     Component Value Date/Time   CHOL 147 12/29/2014 1016   TRIG 58 12/29/2014 1016   HDL 44* 12/29/2014 1016   CHOLHDL 3.3 12/29/2014 1016   VLDL 12 12/29/2014 1016   LDLCALC 91 12/29/2014 1016       Assessment and plan:   [name] was seen today for asthma and depression.  Diagnoses and all orders for this visit:  Asthma, mild persistent, uncomplicated Orders: -     albuterol (PROVENTIL HFA;VENTOLIN HFA) 108 (90 BASE) MCG/ACT inhaler; Inhale 2 puffs into the lungs daily as needed for shortness of  breath. -     Fluticasone-Salmeterol (ADVAIR DISKUS) 500-50 MCG/DOSE AEPB; Inhale 1 puff into the lungs 2 (two) times daily. I will have patient sign up for PASS with pharmacy to see if she can get correct dose of Advair. Smoking cessation advised asap.   Depression Orders: -     FLUoxetine (PROZAC) 40 MG capsule; Take 1 capsule (  40 mg total) by mouth daily. Increased prozac today and advised Melatonin for sleep. Will refer patient to psychiatry   Right knee pain Orders: -     Ambulatory referral to Orthopedic Surgery  Irregular menses Orders: -     Ambulatory referral to Gynecology Per patients request  Family history of ovarian cancer See above   Smoker The patient was counseled on the dangers of tobacco use, and was advised to quit.  Reviewed strategies to maximize success, including removing cigarettes and smoking materials from environment, stress management and written materials.  Return in about 6 months (around 09/18/2015).       Chari Manning, NP-C [facility] [phone] 03/18/2015, 12:35 PM

## 2015-03-22 ENCOUNTER — Encounter: Payer: Self-pay | Admitting: Internal Medicine

## 2015-03-23 ENCOUNTER — Telehealth: Payer: Self-pay | Admitting: Clinical

## 2015-03-23 NOTE — Telephone Encounter (Signed)
Unable to leave message, no voice message available

## 2015-03-24 ENCOUNTER — Encounter: Payer: Self-pay | Admitting: Obstetrics & Gynecology

## 2015-03-25 ENCOUNTER — Encounter: Payer: Self-pay | Admitting: Obstetrics & Gynecology

## 2015-04-18 ENCOUNTER — Emergency Department (HOSPITAL_COMMUNITY)
Admission: EM | Admit: 2015-04-18 | Discharge: 2015-04-18 | Disposition: A | Discharge status: Home / Self Care | Payer: Self-pay | Attending: Emergency Medicine | Admitting: Emergency Medicine

## 2015-04-18 ENCOUNTER — Ambulatory Visit: Payer: Self-pay | Attending: Internal Medicine

## 2015-04-18 ENCOUNTER — Encounter: Payer: Self-pay | Admitting: Internal Medicine

## 2015-04-18 ENCOUNTER — Emergency Department (HOSPITAL_COMMUNITY): Payer: Self-pay

## 2015-04-18 ENCOUNTER — Encounter (HOSPITAL_COMMUNITY): Payer: Self-pay | Admitting: Nurse Practitioner

## 2015-04-18 DIAGNOSIS — F329 Major depressive disorder, single episode, unspecified: Secondary | ICD-10-CM | POA: Insufficient documentation

## 2015-04-18 DIAGNOSIS — K0889 Other specified disorders of teeth and supporting structures: Secondary | ICD-10-CM

## 2015-04-18 DIAGNOSIS — J45909 Unspecified asthma, uncomplicated: Secondary | ICD-10-CM | POA: Insufficient documentation

## 2015-04-18 DIAGNOSIS — Z880 Allergy status to penicillin: Secondary | ICD-10-CM | POA: Insufficient documentation

## 2015-04-18 DIAGNOSIS — Z79899 Other long term (current) drug therapy: Secondary | ICD-10-CM | POA: Insufficient documentation

## 2015-04-18 DIAGNOSIS — Z86018 Personal history of other benign neoplasm: Secondary | ICD-10-CM | POA: Insufficient documentation

## 2015-04-18 DIAGNOSIS — S0993XA Unspecified injury of face, initial encounter: Secondary | ICD-10-CM | POA: Insufficient documentation

## 2015-04-18 DIAGNOSIS — Y998 Other external cause status: Secondary | ICD-10-CM | POA: Insufficient documentation

## 2015-04-18 DIAGNOSIS — Y9289 Other specified places as the place of occurrence of the external cause: Secondary | ICD-10-CM | POA: Insufficient documentation

## 2015-04-18 DIAGNOSIS — W1839XA Other fall on same level, initial encounter: Secondary | ICD-10-CM | POA: Insufficient documentation

## 2015-04-18 DIAGNOSIS — Z7951 Long term (current) use of inhaled steroids: Secondary | ICD-10-CM | POA: Insufficient documentation

## 2015-04-18 DIAGNOSIS — Z87891 Personal history of nicotine dependence: Secondary | ICD-10-CM | POA: Insufficient documentation

## 2015-04-18 DIAGNOSIS — Y9389 Activity, other specified: Secondary | ICD-10-CM | POA: Insufficient documentation

## 2015-04-18 DIAGNOSIS — Z8742 Personal history of other diseases of the female genital tract: Secondary | ICD-10-CM | POA: Insufficient documentation

## 2015-04-18 DIAGNOSIS — Z862 Personal history of diseases of the blood and blood-forming organs and certain disorders involving the immune mechanism: Secondary | ICD-10-CM | POA: Insufficient documentation

## 2015-04-18 DIAGNOSIS — S80211A Abrasion, right knee, initial encounter: Secondary | ICD-10-CM | POA: Insufficient documentation

## 2015-04-18 DIAGNOSIS — R55 Syncope and collapse: Secondary | ICD-10-CM | POA: Insufficient documentation

## 2015-04-18 LAB — URINALYSIS, ROUTINE W REFLEX MICROSCOPIC
BILIRUBIN URINE: NEGATIVE
GLUCOSE, UA: NEGATIVE mg/dL
KETONES UR: NEGATIVE mg/dL
Leukocytes, UA: NEGATIVE
Nitrite: NEGATIVE
PROTEIN: NEGATIVE mg/dL
Specific Gravity, Urine: 1.011 (ref 1.005–1.030)
Urobilinogen, UA: 1 mg/dL (ref 0.0–1.0)
pH: 6 (ref 5.0–8.0)

## 2015-04-18 LAB — BASIC METABOLIC PANEL
Anion gap: 8 (ref 5–15)
BUN: 5 mg/dL — AB (ref 6–20)
CO2: 25 mmol/L (ref 22–32)
Calcium: 8.6 mg/dL — ABNORMAL LOW (ref 8.9–10.3)
Chloride: 105 mmol/L (ref 101–111)
Creatinine, Ser: 0.85 mg/dL (ref 0.44–1.00)
GFR calc Af Amer: >60 mL/min (ref >60)
GFR calc non Af Amer: >60 mL/min (ref >60)
GLUCOSE: 86 mg/dL (ref 65–99)
POTASSIUM: 3.6 mmol/L (ref 3.5–5.1)
Sodium: 138 mmol/L (ref 135–145)

## 2015-04-18 LAB — CBG MONITORING, ED: Glucose-Capillary: 73 mg/dL (ref 65–99)

## 2015-04-18 LAB — CBC
HCT: 34.9 % — ABNORMAL LOW (ref 36.0–46.0)
HEMOGLOBIN: 12.3 g/dL (ref 12.0–15.0)
MCH: 27.7 pg (ref 26.0–34.0)
MCHC: 35.2 g/dL (ref 30.0–36.0)
MCV: 78.6 fL (ref 78.0–100.0)
Platelets: 314 10*3/uL (ref 150–400)
RBC: 4.44 MIL/uL (ref 3.87–5.11)
RDW: 13.4 % (ref 11.5–15.5)
WBC: 7.2 10*3/uL (ref 4.0–10.5)

## 2015-04-18 LAB — URINE MICROSCOPIC-ADD ON

## 2015-04-18 MED ORDER — CLINDAMYCIN HCL 300 MG PO CAPS: 300.0000 mg | ORAL_CAPSULE | Freq: Four times a day (QID) | ORAL | Status: DC

## 2015-04-18 MED ORDER — HYDROCODONE-ACETAMINOPHEN 5-325 MG PO TABS: 1.0000 | ORAL_TABLET | Freq: Four times a day (QID) | ORAL | Status: DC | PRN

## 2015-04-18 NOTE — ED Notes (Signed)
CBG 73 

## 2015-04-18 NOTE — Progress Notes (Signed)
Patient reports coming to [facility] to have lab work drawn. While walking here she reports feeling dizzy and passing out. Patient reports being "out" for a minute and then being able to "brush it off" and come on into [facility]. Vitals signs as follows: 112/81, 98.8 oral, 99, 18, 98% on room air. Patient has abrasion to right knee.   Nurse spoke with provider, Mateo Flow. Provider requested patient to go to ED due to patients loss of consciousness. Patient agrees with providers recommendation.

## 2015-04-18 NOTE — ED Provider Notes (Signed)
CSN: [id number]     Arrival date & time 04/18/15  1017 History   First MD Initiated Contact with Patient 04/18/15 1026     Chief Complaint  Patient presents with  . Loss of Consciousness     (Consider location/radiation/quality/duration/timing/severity/associated sxs/prior Treatment) HPI Comments: Patient is a 44 year old female with history of asthma, fibroids, and depression. She presents for evaluation of a syncopal episode. She states that she was on her way to a doctor's appointment this morning when her knee gave out and she passed out and fell on the floor. She reports a brief loss of consciousness, however there was no reported seizure activity and she denies any bowel or bladder incontinence, or oral trauma. She now feels fine. She was sent here at the recommendation of her primary doctor to be evaluated.  She also reports a toothache in her left lower molar.  Patient is a 44 y.o. female presenting with syncope. The history is provided by the patient.  Loss of Consciousness Episode history:  Single Most recent episode:  Today Timing:  Constant Progression:  Resolved Chronicity:  New Relieved by:  Nothing Worsened by:  Nothing tried Ineffective treatments:  None tried   Past Medical History  Diagnosis Date  . Allergy     pollen, pcn  . Asthma   . Fibroids   . History of ovarian cyst   . Sickle cell anemia     has the trait  . Menometrorrhagia   . Sickle cell trait   . Depression   . Medical history non-contributory     sickle cell   Past Surgical History  Procedure Laterality Date  . Cesarean section      all three pregnancies   Family History  Problem Relation Age of Onset  . Cancer Mother     colon cancer  . Hypertension Mother   . Heart disease Father   . Cancer Father     cancer  . Other Neg Hx    History  Substance Use Topics  . Smoking status: Former Smoker -- 0.25 packs/day    Types: Cigarettes  . Smokeless tobacco: Never Used     Comment:  cessation info given  . Alcohol Use: No   OB History    Gravida Para Term Preterm AB TAB SAB Ectopic Multiple Living   3 3 0       3     Review of Systems  Cardiovascular: Positive for syncope.  All other systems reviewed and are negative.     Allergies  Bupropion hcl er (sr); Fruit & vegetable daily; Penicillins; Pollen extract-tree extract; and Other  Home Medications   Prior to Admission medications   Medication Sig Start Date End Date Taking? Authorizing Provider  albuterol (PROVENTIL HFA;VENTOLIN HFA) 108 (90 BASE) MCG/ACT inhaler Inhale 2 puffs into the lungs daily as needed for shortness of breath. 03/18/15   Lance Bosch, NP  ergocalciferol (DRISDOL) 50000 UNITS capsule Take 1 capsule (50,000 Units total) by mouth once a week. 01/07/15   Lance Bosch, NP  FLUoxetine (PROZAC) 40 MG capsule Take 1 capsule (40 mg total) by mouth daily. 03/18/15   Lance Bosch, NP  Fluticasone-Salmeterol (ADVAIR DISKUS) 250-50 MCG/DOSE AEPB Inhale 1 puff into the lungs 2 (two) times daily. 03/18/15   Lance Bosch, NP  Fluticasone-Salmeterol (ADVAIR DISKUS) 500-50 MCG/DOSE AEPB Inhale 1 puff into the lungs 2 (two) times daily. 03/18/15   Lance Bosch, NP  HYDROcodone-acetaminophen (NORCO/VICODIN) 5-325 MG per  tablet Take 1 tablet by mouth every 6 (six) hours as needed. Patient not taking: Reported on 03/18/2015 11/15/14   Quintella Reichert, MD  ipratropium (ATROVENT HFA) 17 MCG/ACT inhaler Inhale 2 puffs into the lungs 2 (two) times daily. 12/14/14   Veryl Speak, MD  levocetirizine (XYZAL) 5 MG tablet Take 1 tablet (5 mg total) by mouth every evening. 06/12/13   Domenic Moras, PA-C  naproxen (NAPROSYN) 500 MG tablet Take 500 mg by mouth 2 (two) times daily with a meal. 06/19/13   Noemi Chapel, MD  traZODone (DESYREL) 50 MG tablet Take 1 tablet (50 mg total) by mouth at bedtime as needed for sleep. 06/12/13   Domenic Moras, PA-C   BP 106/72 mmHg  Pulse 76  Temp(Src) 97.9 F (36.6 C) (Oral)  Resp 13  SpO2  97%  LMP 04/08/2015 Physical Exam  Constitutional: She is oriented to person, place, and time. She appears well-developed and well-nourished. No distress.  HENT:  Head: Normocephalic and atraumatic.  Mouth/Throat: Oropharynx is clear and moist.  The left lower second molar is noted to be heavily decayed with a large filling and caries. The first and third molars are absent. There is no significant gingival inflammation or swelling.  Eyes: EOM are normal. Pupils are equal, round, and reactive to light.  Neck: Normal range of motion. Neck supple.  Cardiovascular: Normal rate and regular rhythm.  Exam reveals no gallop and no friction rub.   No murmur heard. Pulmonary/Chest: Effort normal and breath sounds normal. No respiratory distress. She has no wheezes.  Abdominal: Soft. Bowel sounds are normal. She exhibits no distension. There is no tenderness.  Musculoskeletal: Normal range of motion.  There is an abrasion to the right knee, however no significant swelling or deformity. She had good range of motion. The knee is stable AP and laterally.  Neurological: She is alert and oriented to person, place, and time. No cranial nerve deficit. She exhibits normal muscle tone. Coordination normal.  Skin: Skin is warm and dry. She is not diaphoretic.  Nursing note and vitals reviewed.   ED Course  Procedures (including critical care time) Labs Review Labs Reviewed  BASIC METABOLIC PANEL  CBC  URINALYSIS, ROUTINE W REFLEX MICROSCOPIC (NOT AT [facility])  CBG MONITORING, ED    Imaging Review No results found.   EKG Interpretation   Date/Time:  Monday April 18 2015 10:31:18 EDT Ventricular Rate:  77 PR Interval:  137 QRS Duration: 83 QT Interval:  421 QTC Calculation: 476 R Axis:   62 Text Interpretation:  Sinus rhythm Baseline wander in lead(s) V3 Confirmed  by Hawley Michel  MD, Princetta Uplinger ([id number]) on 04/18/2015 10:36:04 AM      MDM   Final diagnoses:  None    Workup reveals no obvious  abnormality or cause for her syncopal episode. Her electrolytes, blood counts, and EKG are unremarkable. She has remained stable throughout her emergency department stay with no ectopy and no further episodes. She will be discharged home with when necessary return. I have also agreed to prescribe antibiotics which should hopefully help with her toothache.    Veryl Speak, MD 04/18/15 1247

## 2015-04-18 NOTE — ED Notes (Signed)
She was walking into a doctors appt this am and had a syncopal episode, falling onto her R knee. She has abrasion with bandage to R knee. She c/o pain in the R knee. States shes had "problems" with that knee for 2 months now. She states she woke on her own after about 1 minute. She states she continues to feel lightheaded. She also c/o L lower toothache onset yesterday. She is A&Ox4, MAE.

## 2015-04-18 NOTE — Progress Notes (Signed)
Patient ID: [name], female   DOB: 1970-10-13, 44 y.o.   MRN: [id number] Reviewed RN notes and I have advised nurse to send patient to ER due to reported loss of consciousness. Patient sent in a taxi.   Lance Bosch, NP 04/18/2015 10:12 AM

## 2015-04-18 NOTE — Discharge Instructions (Signed)
Clindamycin as prescribed.  Follow-up with a dentist in the next 2-3 days.  Return to the emergency department if symptoms significantly worsen or change.   Syncope Syncope is a medical term for fainting or passing out. This means you lose consciousness and drop to the ground. People are generally unconscious for less than 5 minutes. You may have some muscle twitches for up to 15 seconds before waking up and returning to normal. Syncope occurs more often in older adults, but it can happen to anyone. While most causes of syncope are not dangerous, syncope can be a sign of a serious medical problem. It is important to seek medical care.  CAUSES  Syncope is caused by a sudden drop in blood flow to the brain. The specific cause is often not determined. Factors that can bring on syncope include:  Taking medicines that lower blood pressure.  Sudden changes in posture, such as standing up quickly.  Taking more medicine than prescribed.  Standing in one place for too long.  Seizure disorders.  Dehydration and excessive exposure to heat.  Low blood sugar (hypoglycemia).  Straining to have a bowel movement.  Heart disease, irregular heartbeat, or other circulatory problems.  Fear, emotional distress, seeing blood, or severe pain. SYMPTOMS  Right before fainting, you may:  Feel dizzy or light-headed.  Feel nauseous.  See all white or all black in your field of vision.  Have cold, clammy skin. DIAGNOSIS  Your health care provider will ask about your symptoms, perform a physical exam, and perform an electrocardiogram (ECG) to record the electrical activity of your heart. Your health care provider may also perform other heart or blood tests to determine the cause of your syncope which may include:  Transthoracic echocardiogram (TTE). During echocardiography, sound waves are used to evaluate how blood flows through your heart.  Transesophageal echocardiogram (TEE).  Cardiac  monitoring. This allows your health care provider to monitor your heart rate and rhythm in real time.  Holter monitor. This is a portable device that records your heartbeat and can help diagnose heart arrhythmias. It allows your health care provider to track your heart activity for several days, if needed.  Stress tests by exercise or by giving medicine that makes the heart beat faster. TREATMENT  In most cases, no treatment is needed. Depending on the cause of your syncope, your health care provider may recommend changing or stopping some of your medicines. HOME CARE INSTRUCTIONS  Have someone stay with you until you feel stable.  Do not drive, use machinery, or play sports until your health care provider says it is okay.  Keep all follow-up appointments as directed by your health care provider.  Lie down right away if you start feeling like you might faint. Breathe deeply and steadily. Wait until all the symptoms have passed.  Drink enough fluids to keep your urine clear or pale yellow.  If you are taking blood pressure or heart medicine, get up slowly and take several minutes to sit and then stand. This can reduce dizziness. SEEK IMMEDIATE MEDICAL CARE IF:   You have a severe headache.  You have unusual pain in the chest, abdomen, or back.  You are bleeding from your mouth or rectum, or you have black or tarry stool.  You have an irregular or very fast heartbeat.  You have pain with breathing.  You have repeated fainting or seizure-like jerking during an episode.  You faint when sitting or lying down.  You have confusion.  You  have trouble walking.  You have severe weakness.  You have vision problems. If you fainted, call your local emergency services (911 in [place] Do not drive yourself to the hospital.  MAKE SURE YOU:  Understand these instructions.  Will watch your condition.  Will get help right away if you are not doing well or get worse. Document Released:  09/03/2005 Document Revised: 09/08/2013 Document Reviewed: 11/02/2011 [facility] Patient Information 2015 [place], [place]. This information is not intended to replace advice given to you by your health care provider. Make sure you discuss any questions you have with your health care provider.    Dental Pain A tooth ache may be caused by cavities (tooth decay). Cavities expose the nerve of the tooth to air and hot or cold temperatures. It may come from an infection or abscess (also called a boil or furuncle) around your tooth. It is also often caused by dental caries (tooth decay). This causes the pain you are having. DIAGNOSIS  Your caregiver can diagnose this problem by exam. TREATMENT   If caused by an infection, it may be treated with medications which kill germs (antibiotics) and pain medications as prescribed by your caregiver. Take medications as directed.  Only take over-the-counter or prescription medicines for pain, discomfort, or fever as directed by your caregiver.  Whether the tooth ache today is caused by infection or dental disease, you should see your dentist as soon as possible for further care. SEEK MEDICAL CARE IF: The exam and treatment you received today has been provided on an emergency basis only. This is not a substitute for complete medical or dental care. If your problem worsens or new problems (symptoms) appear, and you are unable to meet with your dentist, call or return to this location. SEEK IMMEDIATE MEDICAL CARE IF:   You have a fever.  You develop redness and swelling of your face, jaw, or neck.  You are unable to open your mouth.  You have severe pain uncontrolled by pain medicine. MAKE SURE YOU:   Understand these instructions.  Will watch your condition.  Will get help right away if you are not doing well or get worse. Document Released: 09/03/2005 Document Revised: 11/26/2011 Document Reviewed: 04/21/2008 [facility] Patient Information 2015  [place], [place]. This information is not intended to replace advice given to you by your health care provider. Make sure you discuss any questions you have with your health care provider.

## 2015-04-20 ENCOUNTER — Encounter: Payer: Self-pay | Admitting: Obstetrics & Gynecology

## 2015-05-11 ENCOUNTER — Encounter: Payer: Self-pay | Admitting: Obstetrics & Gynecology

## 2015-05-20 ENCOUNTER — Telehealth: Payer: Self-pay

## 2015-05-20 ENCOUNTER — Telehealth: Payer: Self-pay | Admitting: Internal Medicine

## 2015-05-20 NOTE — Telephone Encounter (Signed)
Patient presents to the clinic to obtain her results. Patient also wants [place] to know that Friday of last week she went for a walk and passed out. She is concerned about this and is needing advise. Please follow up with pt.

## 2015-05-20 NOTE — Telephone Encounter (Signed)
Returned patient phone call Home number states at the subscribers request  Phone does not accept incoming calls

## 2015-06-01 ENCOUNTER — Ambulatory Visit: Payer: Self-pay | Attending: Internal Medicine | Admitting: Internal Medicine

## 2015-06-01 ENCOUNTER — Encounter: Payer: Self-pay | Admitting: Internal Medicine

## 2015-06-01 VITALS — BP 113/75 | HR 82 | Temp 98.8°F | Resp 16 | Ht 61.0 in | Wt 149.8 lb

## 2015-06-01 DIAGNOSIS — K0889 Other specified disorders of teeth and supporting structures: Secondary | ICD-10-CM

## 2015-06-01 DIAGNOSIS — M25561 Pain in right knee: Secondary | ICD-10-CM

## 2015-06-01 DIAGNOSIS — M25562 Pain in left knee: Secondary | ICD-10-CM

## 2015-06-01 DIAGNOSIS — K088 Other specified disorders of teeth and supporting structures: Secondary | ICD-10-CM

## 2015-06-01 DIAGNOSIS — F32A Depression, unspecified: Secondary | ICD-10-CM

## 2015-06-01 DIAGNOSIS — L309 Dermatitis, unspecified: Secondary | ICD-10-CM

## 2015-06-01 DIAGNOSIS — F329 Major depressive disorder, single episode, unspecified: Secondary | ICD-10-CM

## 2015-06-01 MED ORDER — TRAZODONE HCL 50 MG PO TABS: 50.0000 mg | ORAL_TABLET | Freq: Every evening | ORAL | Status: DC | PRN

## 2015-06-01 MED ORDER — ACETAMINOPHEN-CODEINE #3 300-30 MG PO TABS: 1.0000 | ORAL_TABLET | Freq: Three times a day (TID) | ORAL | Status: DC | PRN

## 2015-06-01 MED ORDER — TRIAMCINOLONE ACETONIDE 0.1 % EX CREA: 1.0000 "application " | TOPICAL_CREAM | Freq: Two times a day (BID) | CUTANEOUS | Status: DC

## 2015-06-01 NOTE — Progress Notes (Signed)
Patient states she is here for follow up Complains of having bilateral leg pain Rash to the left side of her neck and tooth pain

## 2015-06-01 NOTE — Patient Instructions (Addendum)
[facility] ? Address: [address], [place], [place] [address] Phone: [phone]---call to make appt for your knee pain. Let them know you have the hospital discount program  I have also called about GYN referral. If you have not heard back by Friday please call us back to make sure it went through this time for your irregular menstrual cycles

## 2015-06-01 NOTE — Progress Notes (Addendum)
Patient ID: [name], female   DOB: Feb 13, 1971, 44 y.o.   MRN: [id number]  CC: follow up  HPI: [name] is a 44 y.o. female here today for a follow up visit.  Patient has past medical history of asthma, fibroids, depression.  Patient reports that since her syncopal episode last month she has been continuing to have episodes of leg weakness and falls. She states that she is not losing consciousness but just feels weak. The pain is greatest in her knees with right greater than left. She reports that she did not receive a call for Orthopedics yet. Bilateral leg pain that begins in the calf and extend up to her knee. Pain described as a stabbing pain and scrubbing pain in her knees when she walks. The pain is while walking and at rest. She has had to progress to using a cane for balance. She states that it feels like her knee is "coming out of the socket".   Rash left side of neck present since 9 days. Family history of eczema. She states that she gets this same rash every year and it is itchy. She denies any new exposures, fevers, chills.   Tooth pain present for 1 month. Located on lower left bottom molar. She is requesting a dental referral.  Feels like since last month she has begun to feel more agitated. She feels like she wants to fight if anyone comes up to her and says something she does not like. She notes two separate attempts where she has almost fought neighbors.  Allergies  Allergen Reactions  . Bupropion Hcl Er (Sr) Hives and Other (See Comments)    The patient has an entire page of reactions that can be caused by the medication, but none that she has suffered.  . Fruit & Vegetable Daily [Nutritional Supplements] Other (See Comments)    "all fruits causes bumps and weird feeling in her mouth"  . Penicillins Hives  . Pollen Extract-Tree Extract Other (See Comments)    Allergies  . Other Rash    "bugs" roaches   Past Medical History  Diagnosis Date  . Allergy     pollen,  pcn  . Asthma   . Fibroids   . History of ovarian cyst   . Sickle cell anemia     has the trait  . Menometrorrhagia   . Sickle cell trait   . Depression   . Medical history non-contributory     sickle cell   Current Outpatient Prescriptions on File Prior to Visit  Medication Sig Dispense Refill  . albuterol (PROVENTIL HFA;VENTOLIN HFA) 108 (90 BASE) MCG/ACT inhaler Inhale 2 puffs into the lungs daily as needed for shortness of breath. 3 Inhaler 3  . FLUoxetine (PROZAC) 40 MG capsule Take 1 capsule (40 mg total) by mouth daily. 30 capsule 3  . Fluticasone-Salmeterol (ADVAIR DISKUS) 250-50 MCG/DOSE AEPB Inhale 1 puff into the lungs 2 (two) times daily. 1 each 3  . Fluticasone-Salmeterol (ADVAIR DISKUS) 500-50 MCG/DOSE AEPB Inhale 1 puff into the lungs 2 (two) times daily. 180 each 3  . HYDROcodone-acetaminophen (NORCO) 5-325 MG per tablet Take 1-2 tablets by mouth every 6 (six) hours as needed. 12 tablet 0  . ipratropium (ATROVENT HFA) 17 MCG/ACT inhaler Inhale 2 puffs into the lungs 2 (two) times daily. 1 Inhaler 12  . levocetirizine (XYZAL) 5 MG tablet Take 1 tablet (5 mg total) by mouth every evening. 30 tablet 0  . naproxen (NAPROSYN) 500 MG tablet Take 500  mg by mouth 2 (two) times daily with a meal.    . traZODone (DESYREL) 50 MG tablet Take 1 tablet (50 mg total) by mouth at bedtime as needed for sleep. 30 tablet 0  . clindamycin (CLEOCIN) 300 MG capsule Take 1 capsule (300 mg total) by mouth 4 (four) times daily. X 7 days (Patient not taking: Reported on 06/01/2015) 28 capsule 0  . ergocalciferol (DRISDOL) 50000 UNITS capsule Take 1 capsule (50,000 Units total) by mouth once a week. (Patient not taking: Reported on 06/01/2015) 14 capsule 0   No current facility-administered medications on file prior to visit.   Family History  Problem Relation Age of Onset  . Cancer Mother     colon cancer  . Hypertension Mother   . Heart disease Father   . Cancer Father     cancer  . Other  Neg Hx    Social History   Social History  . Marital Status: Divorced    Spouse Name: N/A  . Number of Children: N/A  . Years of Education: N/A   Occupational History  . Not on file.   Social History Main Topics  . Smoking status: Former Smoker -- 0.25 packs/day    Types: Cigarettes  . Smokeless tobacco: Never Used     Comment: cessation info given  . Alcohol Use: No  . Drug Use: No  . Sexual Activity:    Partners: Male    Patent examiner Protection: IUD, None   Other Topics Concern  . Not on file   Social History Narrative    Review of Systems: Other than what is stated in HPI, all other systems are negative.   Objective:   Filed Vitals:   06/01/15 1506  BP: 113/75  Pulse: 82  Temp: 98.8 F (37.1 C)  Resp: 16    Physical Exam  Constitutional: She is oriented to person, place, and time.  Cardiovascular: Normal rate, regular rhythm and normal heart sounds.   Pulmonary/Chest: Effort normal and breath sounds normal.  Musculoskeletal: She exhibits tenderness (right knee). She exhibits no edema.  Pain with PROM. Crepitus noted with all bilateral knee ROM  Neurological: She is alert and oriented to person, place, and time.  Skin: Skin is warm and dry. Rash (eczema on left side of neck) noted.  Psychiatric: Judgment normal.  Agitated due to pain     Lab Results  Component Value Date   WBC 7.2 04/18/2015   HGB 12.3 04/18/2015   HCT 34.9* 04/18/2015   MCV 78.6 04/18/2015   PLT 314 04/18/2015   Lab Results  Component Value Date   CREATININE 0.85 04/18/2015   BUN 5* 04/18/2015   NA 138 04/18/2015   K 3.6 04/18/2015   CL 105 04/18/2015   CO2 25 04/18/2015    Lab Results  Component Value Date   HGBA1C 4.9 12/29/2014   Lipid Panel     Component Value Date/Time   CHOL 147 12/29/2014 1016   TRIG 58 12/29/2014 1016   HDL 44* 12/29/2014 1016   CHOLHDL 3.3 12/29/2014 1016   VLDL 12 12/29/2014 1016   LDLCALC 91 12/29/2014 1016       Assessment and  plan:   [name] was seen today for leg pain.  Diagnoses and all orders for this visit:  Bilateral knee pain -    Begin acetaminophen-codeine (TYLENOL #3) 300-30 MG per tablet; Take 1 tablet by mouth every 8 (eight) hours as needed for moderate pain.  Pain, dental -  Ambulatory referral to Dentistry  Depression -     traZODone (DESYREL) 50 MG tablet; Take 1 tablet (50 mg total) by mouth at bedtime as needed for sleep. She will continue on Prozac and trazodone for sleep. I have given patient information to [facility] for additional management. Explained signs and symptoms that should warrant immediate attention.  Patient verbalized understanding with teach back used.  Eczema -    Begin triamcinolone cream (KENALOG) 0.1 %; Apply 1 application topically 2 (two) times daily.  Return if symptoms worsen or fail to improve.      Lance Bosch, [facility] [phone] 06/01/2015, 3:21 PM

## 2015-06-23 ENCOUNTER — Other Ambulatory Visit: Payer: Self-pay | Admitting: Internal Medicine

## 2015-06-23 DIAGNOSIS — M25561 Pain in right knee: Secondary | ICD-10-CM

## 2015-06-23 DIAGNOSIS — M25562 Pain in left knee: Principal | ICD-10-CM

## 2015-06-23 NOTE — Telephone Encounter (Signed)
Patient came in requesting a medication refill foracetaminophen-codeine (TYLENOL #3)  Please follow up.

## 2015-06-27 NOTE — Telephone Encounter (Signed)
Patient came in requesting a medication refill foracetaminophen-codeine (TYLENOL #3)  Please follow up.

## 2015-07-18 MED ORDER — ACETAMINOPHEN-CODEINE #3 300-30 MG PO TABS: 1.0000 | ORAL_TABLET | Freq: Three times a day (TID) | ORAL | Status: DC | PRN

## 2015-07-18 NOTE — Telephone Encounter (Signed)
Patient explains she has been to [facility] three times to pick up medication.  Tylenol 3 is helping her knee pain. Patient is in a lot of pain now and is out of medication. Nurse will send message to provider for refill request.

## 2015-08-08 ENCOUNTER — Ambulatory Visit: Payer: Self-pay

## 2015-08-26 IMAGING — DX DG CHEST 2V
2 series · 2 of 2 positions shown · non-contrast
Comparison: October 21, 2014.

CLINICAL DATA: Acute asthma attack.

EXAM:
CHEST  2 VIEW

[chest pa]
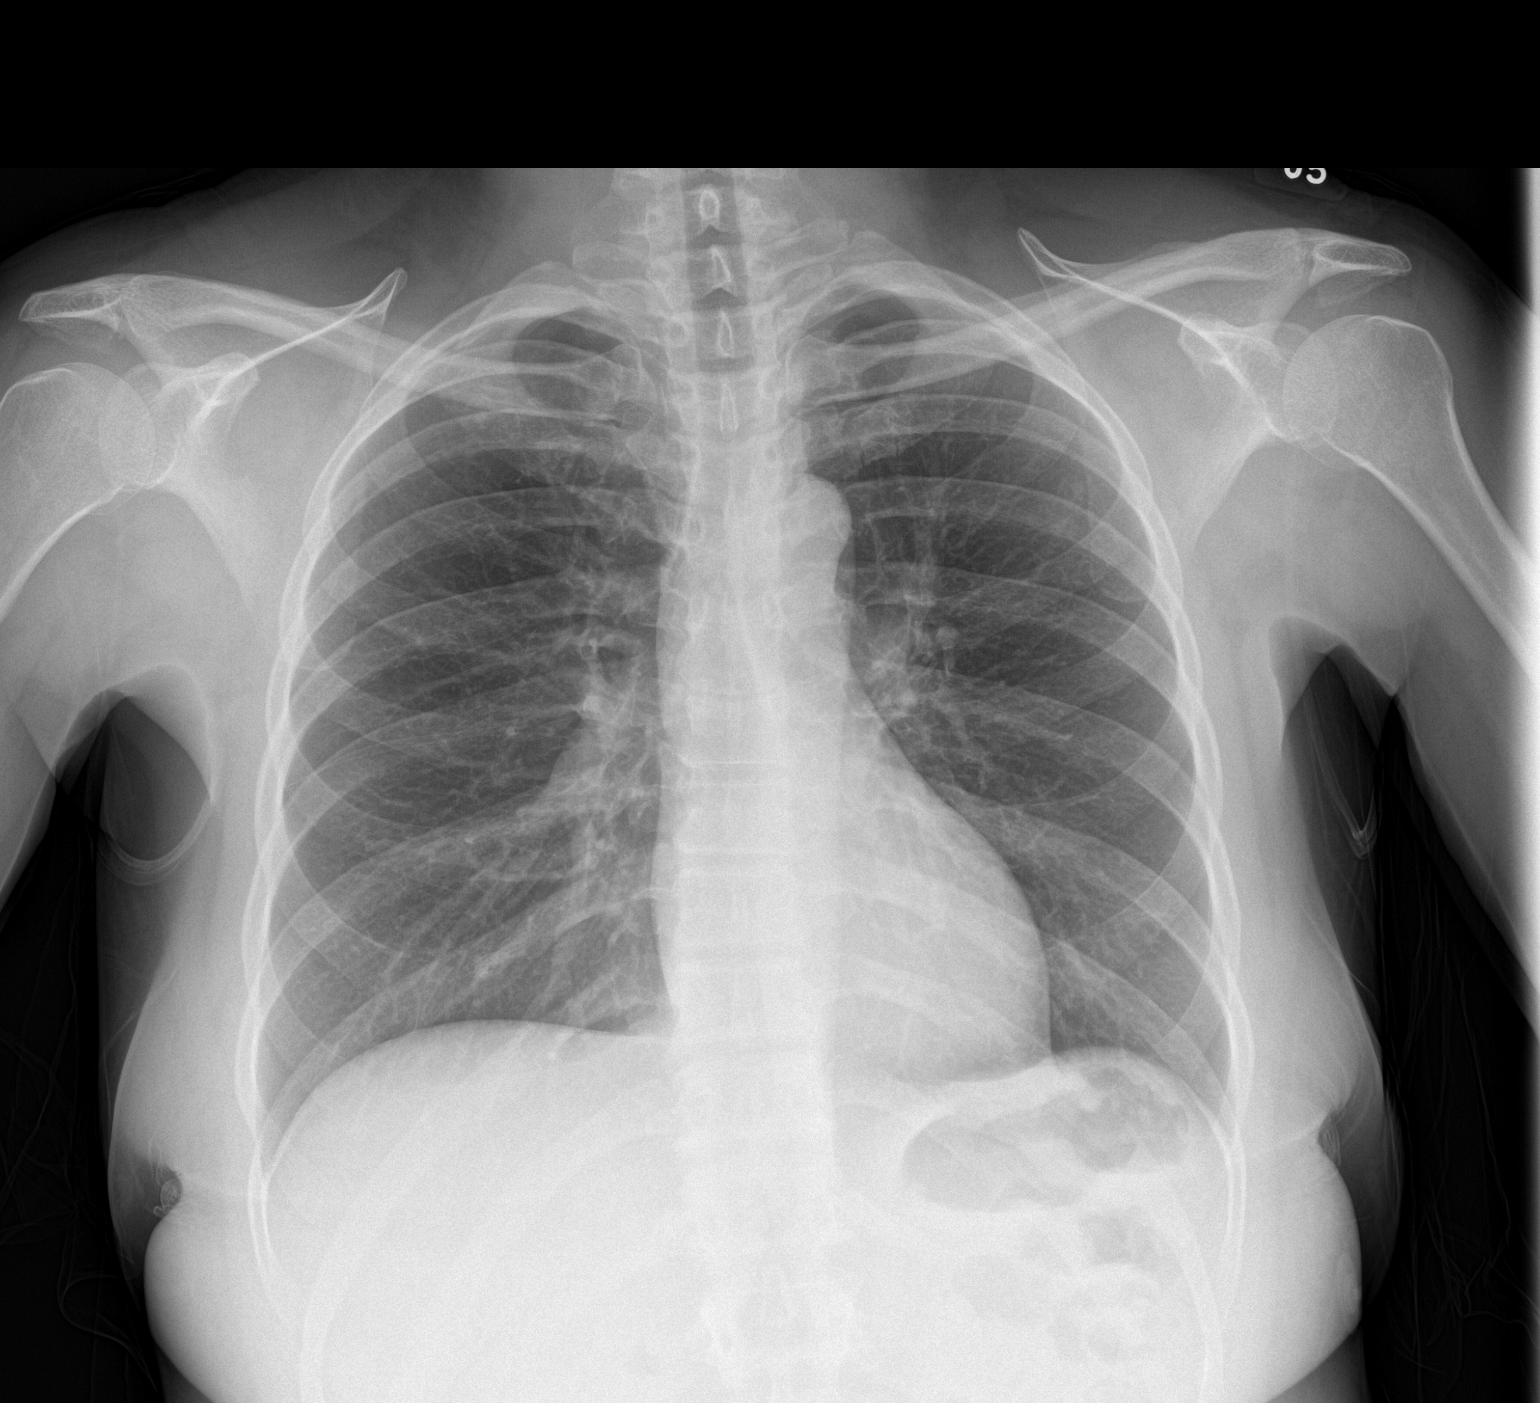

[chest lat]
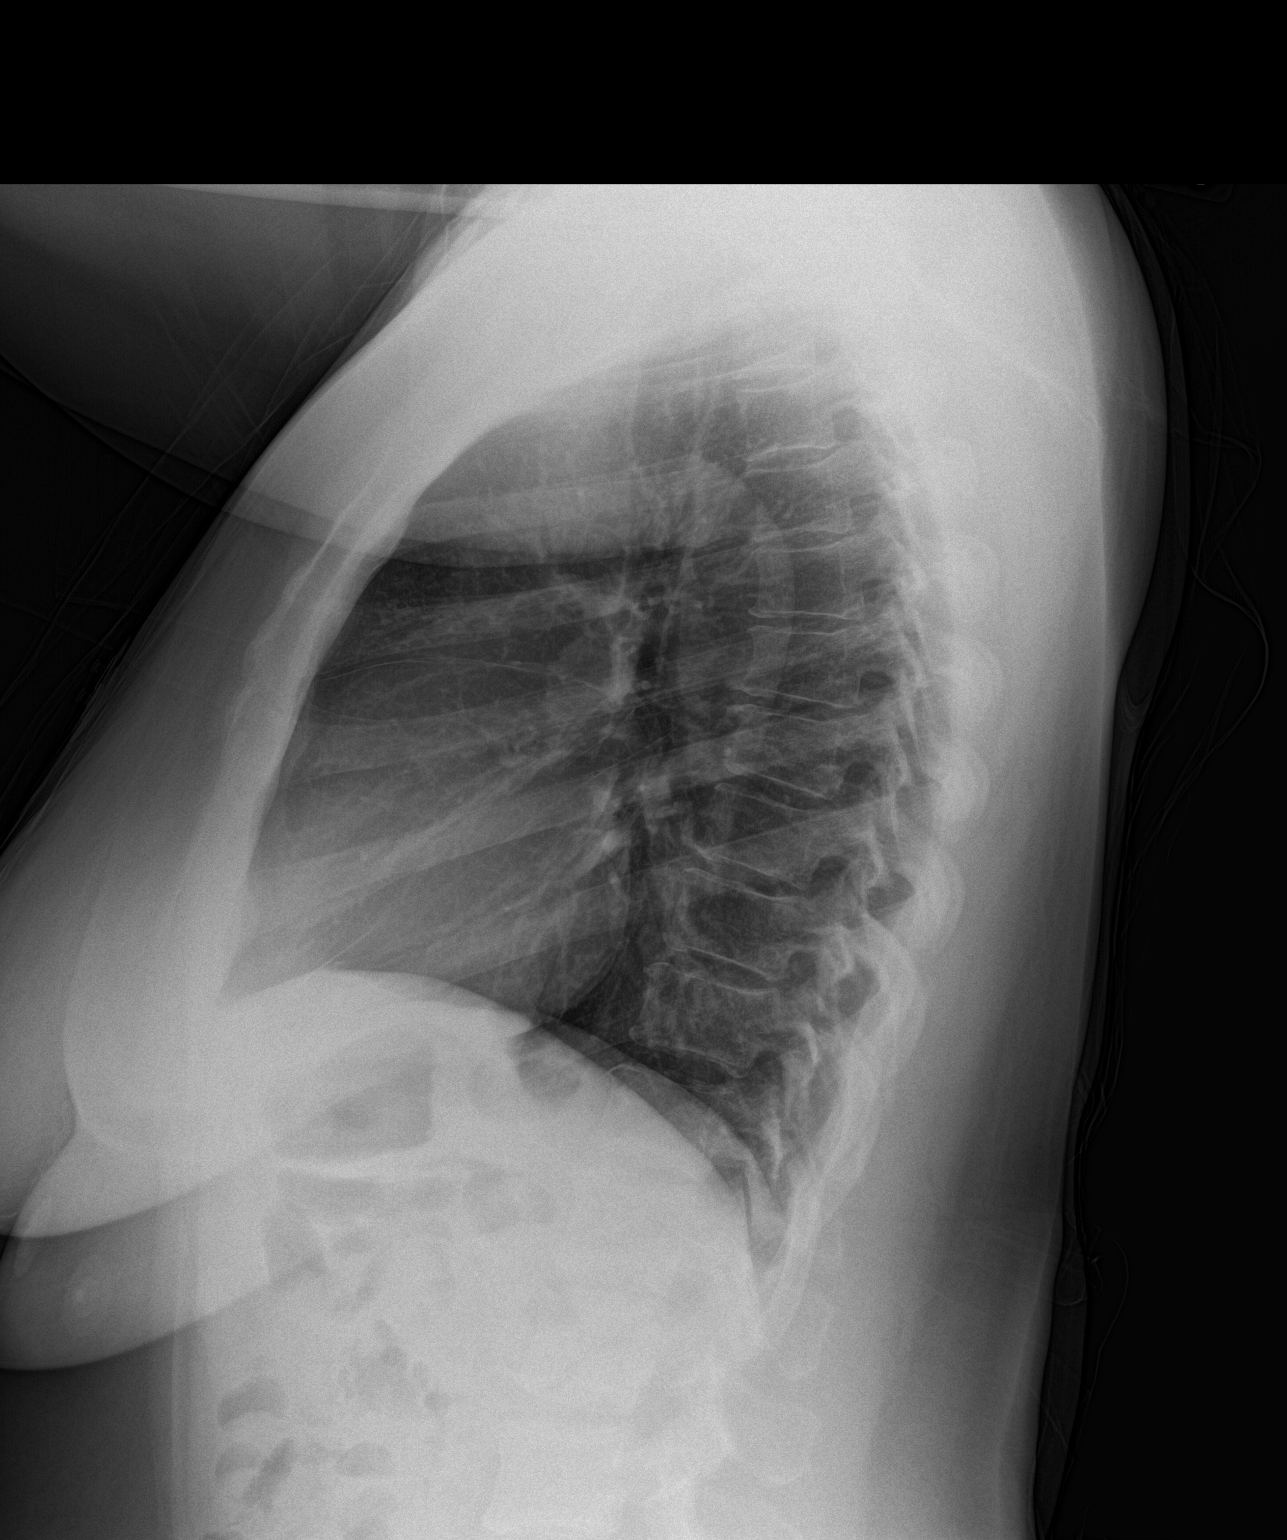

[2 of 2 positions shown; findings below may reference images not displayed]

FINDINGS: The heart size and mediastinal contours are within normal limits.
Both lungs are clear. No pneumothorax or pleural effusion is noted.
The visualized skeletal structures are unremarkable.
IMPRESSION: No acute cardiopulmonary abnormality seen.

## 2015-08-30 ENCOUNTER — Emergency Department (HOSPITAL_COMMUNITY)
Admission: EM | Admit: 2015-08-30 | Discharge: 2015-08-30 | Disposition: A | Discharge status: Home / Self Care | Payer: Self-pay | Attending: Emergency Medicine | Admitting: Emergency Medicine

## 2015-08-30 ENCOUNTER — Encounter (HOSPITAL_COMMUNITY): Payer: Self-pay

## 2015-08-30 DIAGNOSIS — F329 Major depressive disorder, single episode, unspecified: Secondary | ICD-10-CM | POA: Insufficient documentation

## 2015-08-30 DIAGNOSIS — J45909 Unspecified asthma, uncomplicated: Secondary | ICD-10-CM | POA: Insufficient documentation

## 2015-08-30 DIAGNOSIS — Z862 Personal history of diseases of the blood and blood-forming organs and certain disorders involving the immune mechanism: Secondary | ICD-10-CM | POA: Insufficient documentation

## 2015-08-30 DIAGNOSIS — Z87891 Personal history of nicotine dependence: Secondary | ICD-10-CM | POA: Insufficient documentation

## 2015-08-30 DIAGNOSIS — Z791 Long term (current) use of non-steroidal anti-inflammatories (NSAID): Secondary | ICD-10-CM | POA: Insufficient documentation

## 2015-08-30 DIAGNOSIS — Z880 Allergy status to penicillin: Secondary | ICD-10-CM | POA: Insufficient documentation

## 2015-08-30 DIAGNOSIS — Z79899 Other long term (current) drug therapy: Secondary | ICD-10-CM | POA: Insufficient documentation

## 2015-08-30 DIAGNOSIS — Z7952 Long term (current) use of systemic steroids: Secondary | ICD-10-CM | POA: Insufficient documentation

## 2015-08-30 DIAGNOSIS — Z8742 Personal history of other diseases of the female genital tract: Secondary | ICD-10-CM | POA: Insufficient documentation

## 2015-08-30 DIAGNOSIS — L299 Pruritus, unspecified: Secondary | ICD-10-CM | POA: Insufficient documentation

## 2015-08-30 DIAGNOSIS — Z86018 Personal history of other benign neoplasm: Secondary | ICD-10-CM | POA: Insufficient documentation

## 2015-08-30 DIAGNOSIS — Z7951 Long term (current) use of inhaled steroids: Secondary | ICD-10-CM | POA: Insufficient documentation

## 2015-08-30 MED ORDER — DIPHENHYDRAMINE HCL 25 MG PO CAPS
25.0000 mg | ORAL_CAPSULE | Freq: Once | ORAL | Status: AC
Start: 2015-08-30 — End: 2015-08-30
  Administered 2015-08-30: 25 mg via ORAL
  Filled 2015-08-30: qty 1

## 2015-08-30 MED ORDER — ALBUTEROL SULFATE HFA 108 (90 BASE) MCG/ACT IN AERS
2.0000 | INHALATION_SPRAY | RESPIRATORY_TRACT | Status: DC
  Administered 2015-08-30: 2 via RESPIRATORY_TRACT
  Filled 2015-08-30: qty 6.7

## 2015-08-30 MED ORDER — DIPHENHYDRAMINE HCL 25 MG PO TABS: 25.0000 mg | ORAL_TABLET | Freq: Four times a day (QID) | ORAL | Status: DC

## 2015-08-30 NOTE — ED Notes (Signed)
Pt requesting inhaler =; reports running out of her medications

## 2015-08-30 NOTE — ED Provider Notes (Signed)
CSN: [id number]     Arrival date & time 08/30/15  1704 History  By signing my name below, I, [name], attest that this documentation has been prepared under the direction and in the presence of [employer], PA-C. Electronically Signed: Julien Mason, ED Scribe. 08/30/2015. 7:28 PM.    Chief Complaint  Patient presents with  . Pruritis     The history is provided by the patient. No language interpreter was used.   HPI Comments: [name] is a 44 y.o. female who presents to the Emergency Department complaining of gradual worsening pruritis onset this evening. Pt states she ate some moldy bread that she did not know was molded and states she began to start itching shortly after. Pt notes she is allergic to mold. She has not taken any medication to alleviate the itch. Pt denies any rash, difficulty swallowing or breathing, or shortness of breath.   Past Medical History  Diagnosis Date  . Allergy     pollen, pcn  . Asthma   . Fibroids   . History of ovarian cyst   . Sickle cell anemia (HCC)     has the trait  . Menometrorrhagia   . Sickle cell trait ([place])   . Depression   . Medical history non-contributory     sickle cell   Past Surgical History  Procedure Laterality Date  . Cesarean section      all three pregnancies   Family History  Problem Relation Age of Onset  . Cancer Mother     colon cancer  . Hypertension Mother   . Heart disease Father   . Cancer Father     cancer  . Other Neg Hx    Social History  Substance Use Topics  . Smoking status: Former Smoker -- 0.25 packs/day    Types: Cigarettes  . Smokeless tobacco: Never Used     Comment: cessation info given  . Alcohol Use: No   OB History    Gravida Para Term Preterm AB TAB SAB Ectopic Multiple Living   3 3 0       3     Review of Systems  Respiratory: Negative for shortness of breath.   Skin: Negative for rash.      Allergies  Bupropion hcl er (sr); Fruit & vegetable daily;  Penicillins; Pollen extract-tree extract; and Other  Home Medications   Prior to Admission medications   Medication Sig Start Date End Date Taking? Authorizing Provider  acetaminophen-codeine (TYLENOL #3) 300-30 MG tablet Take 1 tablet by mouth every 8 (eight) hours as needed for moderate pain. 07/18/15   Lance Bosch, NP  albuterol (PROVENTIL HFA;VENTOLIN HFA) 108 (90 BASE) MCG/ACT inhaler Inhale 2 puffs into the lungs daily as needed for shortness of breath. 03/18/15   Lance Bosch, NP  clindamycin (CLEOCIN) 300 MG capsule Take 1 capsule (300 mg total) by mouth 4 (four) times daily. X 7 days Patient not taking: Reported on 06/01/2015 04/18/15   Veryl Speak, MD  diphenhydrAMINE (BENADRYL) 25 MG tablet Take 1 tablet (25 mg total) by mouth every 6 (six) hours. 08/30/15   Ernestine Langworthy Patel-Mills, PA-C  ergocalciferol (DRISDOL) 50000 UNITS capsule Take 1 capsule (50,000 Units total) by mouth once a week. Patient not taking: Reported on 06/01/2015 01/07/15   Lance Bosch, NP  FLUoxetine (PROZAC) 40 MG capsule Take 1 capsule (40 mg total) by mouth daily. 03/18/15   Lance Bosch, NP  Fluticasone-Salmeterol (ADVAIR DISKUS) 250-50 MCG/DOSE AEPB  Inhale 1 puff into the lungs 2 (two) times daily. 03/18/15   Lance Bosch, NP  Fluticasone-Salmeterol (ADVAIR DISKUS) 500-50 MCG/DOSE AEPB Inhale 1 puff into the lungs 2 (two) times daily. 03/18/15   Lance Bosch, NP  HYDROcodone-acetaminophen (NORCO) 5-325 MG per tablet Take 1-2 tablets by mouth every 6 (six) hours as needed. 04/18/15   Veryl Speak, MD  ipratropium (ATROVENT HFA) 17 MCG/ACT inhaler Inhale 2 puffs into the lungs 2 (two) times daily. 12/14/14   Veryl Speak, MD  levocetirizine (XYZAL) 5 MG tablet Take 1 tablet (5 mg total) by mouth every evening. 06/12/13   Domenic Moras, PA-C  naproxen (NAPROSYN) 500 MG tablet Take 500 mg by mouth 2 (two) times daily with a meal. 06/19/13   Noemi Chapel, MD  traZODone (DESYREL) 50 MG tablet Take 1 tablet (50 mg total) by mouth  at bedtime as needed for sleep. 06/01/15   Lance Bosch, NP  triamcinolone cream (KENALOG) 0.1 % Apply 1 application topically 2 (two) times daily. 06/01/15   Lance Bosch, NP   Triage vitals: BP 116/77 mmHg  Pulse 77  Temp(Src) 98.6 F (37 C) (Oral)  Resp 20  Ht 5\' 1"  (1.549 m)  Wt 154 lb (69.854 kg)  BMI 29.11 kg/m2  SpO2 98%  LMP 08/22/2015 Physical Exam  Constitutional: She is oriented to person, place, and time. She appears well-developed and well-nourished. No distress.  HENT:  Head: Normocephalic and atraumatic.  Mouth/Throat: Oropharynx is clear and moist. No oropharyngeal exudate.  No throat swelling or difficulty breathing.   Eyes: Conjunctivae and EOM are normal.  Neck: Normal range of motion. Neck supple.  Cardiovascular: Normal rate, regular rhythm and normal heart sounds.   Pulmonary/Chest: Effort normal and breath sounds normal. No respiratory distress. She has no wheezes. She has no rales.  Lungs clear to auscultation bilaterally. No wheezing.   Musculoskeletal: Normal range of motion. She exhibits no edema.  Neurological: She is alert and oriented to person, place, and time. No sensory deficit.  Skin: Skin is warm and dry.  No rash but there are some excoriations on the upper back from scratching.    Psychiatric: She has a normal mood and affect.  Nursing note and vitals reviewed.   ED Course  Procedures  DIAGNOSTIC STUDIES: Oxygen Saturation is 98% on RA, normal by my interpretation.  COORDINATION OF CARE:  7:26 PM Discussed treatment plan which includes benadryl with pt at bedside and pt agreed to plan.   Labs Review Labs Reviewed - No data to display  Imaging Review No results found.   EKG Interpretation None      MDM  Patient presents for pruritis after eating moldy bread. No rash. Patient re-evaluated prior to dc, is hemodynamically stable, in no respiratory distress, and denies the feeling of throat closing. Pt has been advised to take OTC  benadryl & return to the ED if they have a mod-severe allergic rxn (s/s including throat closing, difficulty breathing, swelling of lips face or tongue). Pt is to follow up with their PCP. Pt is agreeable with plan & verbalizes understanding.   Final diagnoses:  Itching   Filed Vitals:   08/30/15 1726 08/30/15 1946  BP: 116/77 120/76  Pulse: 77 60  Temp: 98.6 F (37 C) 97.7 F (36.5 C)  Resp: 20 14   I personally performed the services described in this documentation, which was scribed in my presence. The recorded information has been reviewed and is accurate.  Ottie Glazier, PA-C 08/31/15 1137  Virgel Manifold, MD 09/06/15 [phone]

## 2015-08-30 NOTE — Discharge Instructions (Signed)
Pruritus °Pruritus is an itching feeling. There are many different conditions and factors that can make your skin itchy. Dry skin is one of the most common causes of itching. Most cases of itching do not require medical attention. Itchy skin can turn into a rash.  °HOME CARE INSTRUCTIONS  °Watch your pruritus for any changes. Take these steps to help with your condition:  °Skin Care °· Moisturize your skin as needed. A moisturizer that contains petroleum jelly is best for keeping moisture in your skin. °· Take or apply medicines only as directed by your health care provider. This may include: °¨ Corticosteroid cream. °¨ Anti-itch lotions. °¨ Oral anti-histamines. °· Apply cool compresses to the affected areas. °· Try taking a bath with: °¨ Epsom salts. Follow the instructions on the packaging. You can get these at your local pharmacy or grocery store. °¨ Baking soda. Pour a small amount into the bath as directed by your health care provider. °¨ Colloidal oatmeal. Follow the instructions on the packaging. You can get this at your local pharmacy or grocery store. °· Try applying baking soda paste to your skin. Stir water into baking soda until it reaches a paste-like consistency.   °· Do not scratch your skin. °· Avoid hot showers or baths, which can make itching worse. A cold shower may help with itching as long as you use a moisturizer after. °· Avoid scented soaps, detergents, and perfumes. Use gentle soaps, detergents, perfumes, and other cosmetic products. °General Instructions °· Avoid wearing tight clothes. °· Keep a journal to help track what causes your itch. Write down: °¨ What you eat. °¨ What cosmetic products you use. °¨ What you drink. °¨ What you wear. This includes jewelry. °· Use a humidifier. This keeps the air moist, which helps to prevent dry skin. °SEEK MEDICAL CARE IF: °· The itching does not go away after several days. °· You sweat at night. °· You have weight loss. °· You are unusually  thirsty. °· You urinate more than normal. °· You are more tired than normal. °· You have abdominal pain. °· Your skin tingles. °· You feel weak. °· Your skin or the whites of your eyes look yellow (jaundice). °· Your skin feels numb. °  °This information is not intended to replace advice given to you by your health care provider. Make sure you discuss any questions you have with your health care provider. °  °Document Released: 05/16/2011 Document Revised: 01/18/2015 Document Reviewed: 08/30/2014 °Elsevier Interactive Patient Education ©2016 [employer] Inc. ° °

## 2015-08-30 NOTE — ED Notes (Signed)
Pt states she ate moldy bread today and is now itching all over. She states she is allergic to mold and PCN. Pt A&Ox4, NAD.

## 2015-09-08 ENCOUNTER — Telehealth: Payer: Self-pay | Admitting: Internal Medicine

## 2015-09-08 NOTE — Telephone Encounter (Signed)
Patient called requesting a medication refill for acetaminophen-codeine (TYLENOL #3) °Please follow up with patient.  °

## 2015-09-09 ENCOUNTER — Other Ambulatory Visit: Payer: Self-pay

## 2015-09-09 DIAGNOSIS — M25562 Pain in left knee: Principal | ICD-10-CM

## 2015-09-09 DIAGNOSIS — M25561 Pain in right knee: Secondary | ICD-10-CM

## 2015-09-09 MED ORDER — ACETAMINOPHEN-CODEINE #3 300-30 MG PO TABS: 1.0000 | ORAL_TABLET | Freq: Three times a day (TID) | ORAL | Status: DC | PRN

## 2015-09-13 ENCOUNTER — Telehealth: Payer: Self-pay | Admitting: Internal Medicine

## 2015-10-13 ENCOUNTER — Telehealth: Payer: Self-pay | Admitting: Internal Medicine

## 2015-10-13 NOTE — Telephone Encounter (Signed)
Pt need a refill of Tylenol 3  THANK YOU . Please, call her at [phone]

## 2015-10-14 NOTE — Telephone Encounter (Signed)
Pt. Called requesting a med refill on Tylenol # 3. Please f/u

## 2015-10-17 NOTE — Telephone Encounter (Signed)
Patient is calling back regarding medication refill....please follow up with patient

## 2015-10-18 ENCOUNTER — Telehealth: Payer: Self-pay

## 2015-10-18 ENCOUNTER — Other Ambulatory Visit: Payer: Self-pay

## 2015-10-18 DIAGNOSIS — M25562 Pain in left knee: Principal | ICD-10-CM

## 2015-10-18 DIAGNOSIS — M25561 Pain in right knee: Secondary | ICD-10-CM

## 2015-10-18 MED ORDER — ACETAMINOPHEN-CODEINE #3 300-30 MG PO TABS: 1.0000 | ORAL_TABLET | Freq: Three times a day (TID) | ORAL | Status: DC | PRN

## 2015-10-18 MED FILL — ACETAMINOPHEN/COD #3 TABLET: 300-30 | 20 days supply | Qty: 60 | Fill #0

## 2015-10-18 NOTE — Telephone Encounter (Signed)
Pt. Returned call. Please f/u with pt. °

## 2015-10-18 NOTE — Telephone Encounter (Signed)
Pt. Called requesting a med refill on Tylenol # 3. Please f/u with pt. °

## 2015-10-18 NOTE — Telephone Encounter (Signed)
Returned phone call to patient Patient not available Message states person you are trying to reach is unavailable Please try your  Call again later

## 2015-10-18 NOTE — Telephone Encounter (Signed)
Returned patient phone call Patient is aware her prescription was approved for her tylenol But will not be ready until this afternoon when her provider is in and  Can sign it

## 2015-11-04 ENCOUNTER — Ambulatory Visit: Payer: Self-pay | Attending: Internal Medicine | Admitting: Internal Medicine

## 2015-11-04 ENCOUNTER — Encounter: Payer: Self-pay | Admitting: Internal Medicine

## 2015-11-04 ENCOUNTER — Encounter: Payer: Self-pay | Admitting: Clinical

## 2015-11-04 VITALS — BP 105/72 | HR 67 | Temp 98.0°F | Resp 17 | Wt 149.0 lb

## 2015-11-04 DIAGNOSIS — M79604 Pain in right leg: Secondary | ICD-10-CM | POA: Insufficient documentation

## 2015-11-04 DIAGNOSIS — M79605 Pain in left leg: Secondary | ICD-10-CM | POA: Insufficient documentation

## 2015-11-04 DIAGNOSIS — Z880 Allergy status to penicillin: Secondary | ICD-10-CM | POA: Insufficient documentation

## 2015-11-04 DIAGNOSIS — M170 Bilateral primary osteoarthritis of knee: Secondary | ICD-10-CM

## 2015-11-04 DIAGNOSIS — Z888 Allergy status to other drugs, medicaments and biological substances status: Secondary | ICD-10-CM | POA: Insufficient documentation

## 2015-11-04 DIAGNOSIS — F32A Depression, unspecified: Secondary | ICD-10-CM

## 2015-11-04 DIAGNOSIS — Z79899 Other long term (current) drug therapy: Secondary | ICD-10-CM | POA: Insufficient documentation

## 2015-11-04 DIAGNOSIS — Z87891 Personal history of nicotine dependence: Secondary | ICD-10-CM | POA: Insufficient documentation

## 2015-11-04 DIAGNOSIS — D573 Sickle-cell trait: Secondary | ICD-10-CM | POA: Insufficient documentation

## 2015-11-04 DIAGNOSIS — D571 Sickle-cell disease without crisis: Secondary | ICD-10-CM | POA: Insufficient documentation

## 2015-11-04 DIAGNOSIS — J45909 Unspecified asthma, uncomplicated: Secondary | ICD-10-CM | POA: Insufficient documentation

## 2015-11-04 DIAGNOSIS — N83209 Unspecified ovarian cyst, unspecified side: Secondary | ICD-10-CM | POA: Insufficient documentation

## 2015-11-04 DIAGNOSIS — F329 Major depressive disorder, single episode, unspecified: Secondary | ICD-10-CM

## 2015-11-04 MED ORDER — FLUOXETINE HCL 40 MG PO CAPS: 40.0000 mg | ORAL_CAPSULE | Freq: Every day | ORAL | Status: DC

## 2015-11-04 MED ORDER — FLUTICASONE-SALMETEROL 250-50 MCG/DOSE IN AEPB: 1.0000 | INHALATION_SPRAY | Freq: Two times a day (BID) | RESPIRATORY_TRACT | Status: DC

## 2015-11-04 MED ORDER — NAPROXEN 500 MG PO TABS: 500.0000 mg | ORAL_TABLET | Freq: Two times a day (BID) | ORAL | Status: DC

## 2015-11-04 MED FILL — $ADVAIR 500/50MCG INHALER: 500-50 | 30 days supply | Qty: 60 | Fill #1

## 2015-11-04 MED FILL — NAPROXEN 500 MG TABLET: 500 | 30 days supply | Qty: 60 | Fill #0

## 2015-11-04 MED FILL — ?FLUOXETINE HCL 20MG TABLET: 20 | 15 days supply | Qty: 30 | Fill #4

## 2015-11-04 NOTE — Patient Instructions (Signed)
Get [employer] and I will send you to Orthopedics  Apply for PASS program with Pharmacy

## 2015-11-04 NOTE — Progress Notes (Signed)
Patient ID: [name], female   DOB: [birth year], 45 y.o.   MRN: [id number]  CC: bilateral leg pain  HPI: [name] is a 45 y.o. female here today for a follow up visit. Patient has past medical history of asthma, fibroids, depression.She continues to complain of pain in her bilateral lower extremities. The pain is greatest in her knees with right greater than left. She still has not been to Orthopedics due to lack of insurance. Bilateral leg pain that begins in the calf and extend up to her knee. Pain described as a stabbing pain and scrubbing pain in her knees when she walks. The pain is while walking and at rest. She has had to progress to using a cane for balance.  Patient states that she needs a refill of her Prozac because she has been off her medication for 2 months due to financial reasons.  Allergies  Allergen Reactions  . Bupropion Hcl Er (Sr) Hives and Other (See Comments)    The patient has an entire page of reactions that can be caused by the medication, but none that she has suffered.  . Fruit & Vegetable Daily [Nutritional Supplements] Other (See Comments)    "all fruits causes bumps and weird feeling in her mouth"  . Penicillins Hives  . Pollen Extract-Tree Extract Other (See Comments)    Allergies  . Other Rash    "bugs" roaches   Past Medical History  Diagnosis Date  . Allergy     pollen, pcn  . Asthma   . Fibroids   . History of ovarian cyst   . Sickle cell anemia (HCC)     has the trait  . Menometrorrhagia   . Sickle cell trait ([place])   . Depression   . Medical history non-contributory     sickle cell   Current Outpatient Prescriptions on File Prior to Visit  Medication Sig Dispense Refill  . acetaminophen-codeine (TYLENOL #3) 300-30 MG tablet Take 1 tablet by mouth every 8 (eight) hours as needed for moderate pain. 60 tablet 0  . albuterol (PROVENTIL HFA;VENTOLIN HFA) 108 (90 BASE) MCG/ACT inhaler Inhale 2 puffs into the lungs daily as needed for  shortness of breath. 3 Inhaler 3  . FLUoxetine (PROZAC) 40 MG capsule Take 1 capsule (40 mg total) by mouth daily. 30 capsule 3  . Fluticasone-Salmeterol (ADVAIR DISKUS) 250-50 MCG/DOSE AEPB Inhale 1 puff into the lungs 2 (two) times daily. 1 each 3  . ipratropium (ATROVENT HFA) 17 MCG/ACT inhaler Inhale 2 puffs into the lungs 2 (two) times daily. 1 Inhaler 12  . naproxen (NAPROSYN) 500 MG tablet Take 500 mg by mouth 2 (two) times daily with a meal.    . traZODone (DESYREL) 50 MG tablet Take 1 tablet (50 mg total) by mouth at bedtime as needed for sleep. 30 tablet 1  . clindamycin (CLEOCIN) 300 MG capsule Take 1 capsule (300 mg total) by mouth 4 (four) times daily. X 7 days (Patient not taking: Reported on 06/01/2015) 28 capsule 0  . diphenhydrAMINE (BENADRYL) 25 MG tablet Take 1 tablet (25 mg total) by mouth every 6 (six) hours. 20 tablet 0  . ergocalciferol (DRISDOL) 50000 UNITS capsule Take 1 capsule (50,000 Units total) by mouth once a week. (Patient not taking: Reported on 06/01/2015) 14 capsule 0  . Fluticasone-Salmeterol (ADVAIR DISKUS) 500-50 MCG/DOSE AEPB Inhale 1 puff into the lungs 2 (two) times daily. 180 each 3  . HYDROcodone-acetaminophen (NORCO) 5-325 MG per tablet Take 1-2 tablets by  mouth every 6 (six) hours as needed. 12 tablet 0  . levocetirizine (XYZAL) 5 MG tablet Take 1 tablet (5 mg total) by mouth every evening. 30 tablet 0  . triamcinolone cream (KENALOG) 0.1 % Apply 1 application topically 2 (two) times daily. 30 g 0   No current facility-administered medications on file prior to visit.   Family History  Problem Relation Age of Onset  . Cancer Mother     colon cancer  . Hypertension Mother   . Heart disease Father   . Cancer Father     cancer  . Other Neg Hx    Social History   Social History  . Marital Status: Divorced    Spouse Name: N/A  . Number of Children: N/A  . Years of Education: N/A   Occupational History  . Not on file.   Social History Main  Topics  . Smoking status: Former Smoker -- 0.25 packs/day    Types: Cigarettes  . Smokeless tobacco: Never Used     Comment: cessation info given  . Alcohol Use: No  . Drug Use: No  . Sexual Activity:    Partners: Male    Patent examiner Protection: IUD, None   Other Topics Concern  . Not on file   Social History Narrative    Review of Systems: Other than what is stated in HPI, all other systems are negative.   Objective:   Filed Vitals:   11/04/15 1632  BP: 105/72  Pulse: 67  Temp: 98 F (36.7 C)  Resp: 17    Physical Exam  Constitutional: She is oriented to person, place, and time.  Cardiovascular: Normal rate, regular rhythm and normal heart sounds.   Pulmonary/Chest: Effort normal and breath sounds normal.  Musculoskeletal: Normal range of motion. She exhibits no edema or tenderness.  Neurological: She is alert and oriented to person, place, and time.     Lab Results  Component Value Date   WBC 7.2 04/18/2015   HGB 12.3 04/18/2015   HCT 34.9* 04/18/2015   MCV 78.6 04/18/2015   PLT 314 04/18/2015   Lab Results  Component Value Date   CREATININE 0.85 04/18/2015   BUN 5* 04/18/2015   NA 138 04/18/2015   K 3.6 04/18/2015   CL 105 04/18/2015   CO2 25 04/18/2015    Lab Results  Component Value Date   HGBA1C 4.9 12/29/2014   Lipid Panel     Component Value Date/Time   CHOL 147 12/29/2014 1016   TRIG 58 12/29/2014 1016   HDL 44* 12/29/2014 1016   CHOLHDL 3.3 12/29/2014 1016   VLDL 12 12/29/2014 1016   LDLCALC 91 12/29/2014 1016       Assessment and plan:   Manuela was seen today for leg pain.  Diagnoses and all orders for this visit:  Osteoarthritis of both knees, unspecified osteoarthritis type -     Refill naproxen (NAPROSYN) 500 MG tablet; Take 1 tablet (500 mg total) by mouth 2 (two) times daily with a meal. She will apply for financial assistance so that I may place ortho referral  Depression -     Refilled FLUoxetine (PROZAC) 40 MG  capsule; Take 1 capsule (40 mg total) by mouth daily.   Return if symptoms worsen or fail to improve.        Lance Bosch, [facility] [phone] 11/04/2015, 4:42 PM

## 2015-11-04 NOTE — Progress Notes (Signed)
Patient complains of bilateral leg and knee pain The right leg being the worse Patient also complains of feeling really anxious with  Increased anxiety

## 2015-11-04 NOTE — Progress Notes (Signed)
Depression screen [facility] 2/9 11/04/2015 03/18/2015 03/18/2015 12/22/2014  Decreased Interest 2 1 0 3  Down, Depressed, Hopeless 1 1 0 3  PHQ - 2 Score 3 2 0 6  Altered sleeping 3 1 - 3  Tired, decreased energy 3 1 - 3  Change in appetite 3 2 - 3  Feeling bad or failure about yourself  3 1 - 3  Trouble concentrating 3 1 - 3  Moving slowly or fidgety/restless 3 1 - 3  Suicidal thoughts 2 0 - 2  PHQ-9 Score 23 9 - 26  Difficult doing work/chores - Somewhat difficult - -   GAD 7 : Generalized Anxiety Score 11/04/2015  Nervous, Anxious, on Edge 3  Control/stop worrying 3  Worry too much - different things 3  Trouble relaxing 3  Restless 3  Easily annoyed or irritable 3  Afraid - awful might happen 3  Total GAD 7 Score 21

## 2015-11-17 ENCOUNTER — Telehealth: Payer: Self-pay

## 2015-11-17 NOTE — Telephone Encounter (Signed)
Patient requesting a refill on her tylenol #3 Other medications were filled on 2/17

## 2015-11-21 ENCOUNTER — Other Ambulatory Visit: Payer: Self-pay

## 2015-11-21 ENCOUNTER — Telehealth: Payer: Self-pay

## 2015-11-21 DIAGNOSIS — M25562 Pain in left knee: Principal | ICD-10-CM

## 2015-11-21 DIAGNOSIS — M25561 Pain in right knee: Secondary | ICD-10-CM

## 2015-11-21 MED ORDER — ACETAMINOPHEN-CODEINE #3 300-30 MG PO TABS: 1.0000 | ORAL_TABLET | Freq: Three times a day (TID) | ORAL | Status: DC | PRN

## 2015-11-21 MED FILL — ACETAMINOPHEN/COD #3 TABLET: 300-30 | 20 days supply | Qty: 60 | Fill #0

## 2015-11-21 NOTE — Telephone Encounter (Signed)
Patient returned phone call and is aware her prescription is ready for pick up

## 2015-11-21 NOTE — Telephone Encounter (Signed)
May refill 

## 2015-11-21 NOTE — Telephone Encounter (Signed)
Tried to contact patient to let her know Her RX for tylenol #3 is ready for pick up Patient not available Message left on voice mail to return our call

## 2015-12-19 ENCOUNTER — Telehealth: Payer: Self-pay

## 2015-12-19 NOTE — Telephone Encounter (Signed)
Returned patient phone call  Patient is requesting a refill on her tylenol #3 Patient would also like additional refills on it

## 2015-12-20 NOTE — Telephone Encounter (Signed)
Please refill.

## 2015-12-21 ENCOUNTER — Other Ambulatory Visit: Payer: Self-pay | Admitting: Internal Medicine

## 2015-12-21 NOTE — Telephone Encounter (Signed)
Patient calling to follow up with request for tylenol #3..please call patient

## 2015-12-22 NOTE — Telephone Encounter (Signed)
Patient calling to follow up with request for tylenol #3..please call patient

## 2015-12-26 NOTE — Telephone Encounter (Signed)
Pt. Called requesting a refill on Tylenol #3. Pt. Stated she is in a lot of pain. Please f/u

## 2015-12-28 NOTE — Telephone Encounter (Signed)
Patient called and requested a med refill for Tylenol #3. Please f/u

## 2015-12-29 ENCOUNTER — Other Ambulatory Visit: Payer: Self-pay | Admitting: *Deleted

## 2015-12-29 DIAGNOSIS — M25561 Pain in right knee: Secondary | ICD-10-CM

## 2015-12-29 DIAGNOSIS — M25562 Pain in left knee: Principal | ICD-10-CM

## 2015-12-29 MED ORDER — ACETAMINOPHEN-CODEINE #3 300-30 MG PO TABS
1.0000 | ORAL_TABLET | Freq: Three times a day (TID) | ORAL | Status: DC | PRN
Start: 2015-12-29 — End: 2016-02-10

## 2015-12-29 MED FILL — $ADVAIR 500/50MCG INHALER: 500-50 | 30 days supply | Qty: 60 | Fill #2

## 2015-12-29 MED FILL — ACETAMINOPHEN/COD #3 TABLET: 300-30 | 20 days supply | Qty: 60 | Fill #0

## 2015-12-29 MED FILL — FLUoxetine HCL 40 MG CAPS: 40 | 30 days supply | Qty: 30 | Fill #0

## 2015-12-29 NOTE — Telephone Encounter (Signed)
Patient called to request a med refill for Tylenol #3. Please f/u °

## 2015-12-29 NOTE — Telephone Encounter (Signed)
CMA called patient, patient verified name and DOB. Patient rx for Tramadol was refilled.

## 2015-12-29 NOTE — Telephone Encounter (Signed)
Patients medication was refilled per PCP

## 2016-02-08 ENCOUNTER — Telehealth: Payer: Self-pay | Admitting: Internal Medicine

## 2016-02-08 NOTE — Telephone Encounter (Signed)
Pt. Called requesting a refill on the following medications:  acetaminophen-codeine (TYLENOL #3) 300-30 MG tablet Advir   Pt. Would also like her depression medication.  Please f/u with pt.

## 2016-02-10 ENCOUNTER — Other Ambulatory Visit: Payer: Self-pay | Admitting: Family Medicine

## 2016-02-10 DIAGNOSIS — F329 Major depressive disorder, single episode, unspecified: Secondary | ICD-10-CM

## 2016-02-10 DIAGNOSIS — J4530 Mild persistent asthma, uncomplicated: Secondary | ICD-10-CM

## 2016-02-10 DIAGNOSIS — F32A Depression, unspecified: Secondary | ICD-10-CM

## 2016-02-10 DIAGNOSIS — M25562 Pain in left knee: Principal | ICD-10-CM

## 2016-02-10 DIAGNOSIS — M25561 Pain in right knee: Secondary | ICD-10-CM

## 2016-02-10 MED ORDER — FLUTICASONE-SALMETEROL 500-50 MCG/DOSE IN AEPB: 1.0000 | INHALATION_SPRAY | Freq: Two times a day (BID) | RESPIRATORY_TRACT | Status: DC

## 2016-02-10 MED ORDER — ACETAMINOPHEN-CODEINE #3 300-30 MG PO TABS: 1.0000 | ORAL_TABLET | Freq: Three times a day (TID) | ORAL | Status: DC | PRN

## 2016-02-10 MED ORDER — FLUOXETINE HCL 40 MG PO CAPS: 40.0000 mg | ORAL_CAPSULE | Freq: Every day | ORAL | Status: DC

## 2016-02-10 MED FILL — **ADVAIR 500/50 DISKUS: 500-50 MCG | 14 days supply | Qty: 28 | Fill #0

## 2016-02-10 MED FILL — !VENTOLIN HFA INHALER: 108 (90 BAS | 25 days supply | Qty: 18 | Fill #5

## 2016-02-10 MED FILL — FLUoxetine HCL 40 MG CAPS: 40 | 30 days supply | Qty: 30 | Fill #0

## 2016-02-10 MED FILL — ACETAMINOPHEN/COD #3 TABLET: 300-30 | 20 days supply | Qty: 60 | Fill #0

## 2016-02-10 NOTE — Telephone Encounter (Signed)
Pt. Came into facility stating that her legs are swollen and they feel like they are  On fire. Pt. Would like to speak with the nurse ASAP.  Please f/u with pt.

## 2016-02-10 NOTE — Progress Notes (Signed)
Patient called for refill of Tylenol No. 3 which is ready for pick up ; she will need an office Visit to establish care with me and to sign a controlled substance contract.

## 2016-02-20 ENCOUNTER — Encounter: Payer: Self-pay | Admitting: Family Medicine

## 2016-02-20 ENCOUNTER — Ambulatory Visit: Payer: Self-pay | Attending: Family Medicine | Admitting: Family Medicine

## 2016-02-20 VITALS — BP 105/72 | HR 91 | Temp 98.6°F | Resp 20 | Ht 61.0 in | Wt 157.0 lb

## 2016-02-20 DIAGNOSIS — M545 Low back pain, unspecified: Secondary | ICD-10-CM

## 2016-02-20 DIAGNOSIS — F329 Major depressive disorder, single episode, unspecified: Secondary | ICD-10-CM | POA: Insufficient documentation

## 2016-02-20 DIAGNOSIS — K5909 Other constipation: Secondary | ICD-10-CM

## 2016-02-20 DIAGNOSIS — M549 Dorsalgia, unspecified: Secondary | ICD-10-CM | POA: Insufficient documentation

## 2016-02-20 DIAGNOSIS — J45909 Unspecified asthma, uncomplicated: Secondary | ICD-10-CM | POA: Insufficient documentation

## 2016-02-20 DIAGNOSIS — G629 Polyneuropathy, unspecified: Secondary | ICD-10-CM | POA: Insufficient documentation

## 2016-02-20 DIAGNOSIS — J4520 Mild intermittent asthma, uncomplicated: Secondary | ICD-10-CM | POA: Insufficient documentation

## 2016-02-20 DIAGNOSIS — K5900 Constipation, unspecified: Secondary | ICD-10-CM | POA: Insufficient documentation

## 2016-02-20 DIAGNOSIS — F32A Depression, unspecified: Secondary | ICD-10-CM

## 2016-02-20 DIAGNOSIS — G729 Myopathy, unspecified: Secondary | ICD-10-CM | POA: Insufficient documentation

## 2016-02-20 DIAGNOSIS — Z79899 Other long term (current) drug therapy: Secondary | ICD-10-CM | POA: Insufficient documentation

## 2016-02-20 DIAGNOSIS — M170 Bilateral primary osteoarthritis of knee: Secondary | ICD-10-CM

## 2016-02-20 LAB — RHEUMATOID FACTOR

## 2016-02-20 LAB — COMPLETE METABOLIC PANEL WITH GFR
ALBUMIN: 4.2 g/dL (ref 3.6–5.1)
ALK PHOS: 48 U/L (ref 33–115)
ALT: 8 U/L (ref 6–29)
AST: 16 U/L (ref 10–35)
BILIRUBIN TOTAL: 0.6 mg/dL (ref 0.2–1.2)
BUN: 7 mg/dL (ref 7–25)
CO2: 26 mmol/L (ref 20–31)
CREATININE: 0.75 mg/dL (ref 0.50–1.10)
Calcium: 8.9 mg/dL (ref 8.6–10.2)
Chloride: 106 mmol/L (ref 98–110)
Glucose, Bld: 77 mg/dL (ref 65–99)
Potassium: 4.3 mmol/L (ref 3.5–5.3)
Sodium: 139 mmol/L (ref 135–146)
TOTAL PROTEIN: 6.8 g/dL (ref 6.1–8.1)

## 2016-02-20 LAB — CK: Total CK: 149 U/L (ref 7–177)

## 2016-02-20 LAB — POCT GLYCOSYLATED HEMOGLOBIN (HGB A1C): HEMOGLOBIN A1C: 4.6

## 2016-02-20 LAB — SEDIMENTATION RATE: Sed Rate: 4 mm/hr (ref 0–20)

## 2016-02-20 MED ORDER — TRAZODONE HCL 50 MG PO TABS: 50.0000 mg | ORAL_TABLET | Freq: Every evening | ORAL | Status: DC | PRN

## 2016-02-20 MED ORDER — LACTULOSE 10 GM/15ML PO SOLN: 10.0000 g | Freq: Two times a day (BID) | ORAL | Status: DC | PRN

## 2016-02-20 MED ORDER — NAPROXEN 500 MG PO TABS: 500.0000 mg | ORAL_TABLET | Freq: Two times a day (BID) | ORAL | Status: DC

## 2016-02-20 MED ORDER — GABAPENTIN 300 MG PO CAPS: 300.0000 mg | ORAL_CAPSULE | Freq: Two times a day (BID) | ORAL | Status: DC

## 2016-02-20 MED FILL — LACTULOSE 10 GM/15 ML SOLN: 10 | 30 days supply | Qty: 946 | Fill #0

## 2016-02-20 MED FILL — GABAPENTIN 300 MG CAPSULE: 300 | 30 days supply | Qty: 60 | Fill #0

## 2016-02-20 MED FILL — NAPROXEN 500 MG TABLET: 500 | 30 days supply | Qty: 60 | Fill #0

## 2016-02-20 MED FILL — traZODone HCL 50 MG TABS: 50 | 30 days supply | Qty: 30 | Fill #0

## 2016-02-20 NOTE — Progress Notes (Signed)
Patient is here for bilateral pain in the legs  Patient is complaining of bilateral edema and pain in her legs. Pain and swelling have been present intermittently for months  Patient has taken medication with minimal relief.  Patient would like to have her Mirena removed and replaced. MA informed patient of today's visit being a "Sick" visit concerning her pain. Patient will need to schedule an appointment for women's wellness visit.

## 2016-02-20 NOTE — Progress Notes (Signed)
Subjective:  Patient ID: [name], female    DOB: January 10, 1971  Age: 45 y.o. MRN: [id number]  CC: Leg Pain   HPI [name] is a 45 year old female with a history of asthma, hypertension, depression who comes into the clinic complaining of worsening bilateral lower extremity pain which she has had for several months.  Pain starts in bilateral LE and radiates up to her thighs and up to the lower back and she has noticed progressive difficulty with ambulation to the point where she has to use a cane in her legs give out on sometimes making her fall. Pain is sharp, wakes her up at night and is associated with numbness. Denies history of trauma and takes Tylenol 3 for the pain. She was seen by her PCP 3 months ago and this was diagnosed as osteoarthritis and she was placed on naproxen. X-ray of right knee done in 04/2015 revealed no appreciable arthropathic change. She brings in a disability paperwork which she would like completed as she is working with an attorney to obtain disability.  She suffers from depression and remains on Prozac; pH Q9 score is 22 and GAD 7 score is 17 and she attributes elevated scores to pain. Asthma is controlled on current regimen.  Outpatient Prescriptions Prior to Visit  Medication Sig Dispense Refill  . acetaminophen-codeine (TYLENOL #3) 300-30 MG tablet Take 1 tablet by mouth every 8 (eight) hours as needed for moderate pain. 60 tablet 0  . albuterol (PROVENTIL HFA;VENTOLIN HFA) 108 (90 BASE) MCG/ACT inhaler Inhale 2 puffs into the lungs daily as needed for shortness of breath. 3 Inhaler 3  . diphenhydrAMINE (BENADRYL) 25 MG tablet Take 1 tablet (25 mg total) by mouth every 6 (six) hours. 20 tablet 0  . FLUoxetine (PROZAC) 40 MG capsule Take 1 capsule (40 mg total) by mouth daily. 30 capsule 3  . Fluticasone-Salmeterol (ADVAIR DISKUS) 500-50 MCG/DOSE AEPB Inhale 1 puff into the lungs 2 (two) times daily. 180 each 3  . ipratropium (ATROVENT HFA) 17  MCG/ACT inhaler Inhale 2 puffs into the lungs 2 (two) times daily. 1 Inhaler 12  . levocetirizine (XYZAL) 5 MG tablet Take 1 tablet (5 mg total) by mouth every evening. 30 tablet 0  . triamcinolone cream (KENALOG) 0.1 % Apply 1 application topically 2 (two) times daily. 30 g 0  . ergocalciferol (DRISDOL) 50000 UNITS capsule Take 1 capsule (50,000 Units total) by mouth once a week. 14 capsule 0  . HYDROcodone-acetaminophen (NORCO) 5-325 MG per tablet Take 1-2 tablets by mouth every 6 (six) hours as needed. 12 tablet 0  . naproxen (NAPROSYN) 500 MG tablet Take 1 tablet (500 mg total) by mouth 2 (two) times daily with a meal. 1 tablet 3  . traZODone (DESYREL) 50 MG tablet Take 1 tablet (50 mg total) by mouth at bedtime as needed for sleep. 30 tablet 1  . clindamycin (CLEOCIN) 300 MG capsule Take 1 capsule (300 mg total) by mouth 4 (four) times daily. X 7 days (Patient not taking: Reported on 06/01/2015) 28 capsule 0   No facility-administered medications prior to visit.    ROS Review of Systems  Constitutional: Negative for activity change, appetite change and fatigue.  HENT: Negative for congestion, sinus pressure and sore throat.   Eyes: Negative for visual disturbance.  Respiratory: Negative for cough, chest tightness, shortness of breath and wheezing.   Cardiovascular: Negative for chest pain and palpitations.  Gastrointestinal: Positive for constipation. Negative for abdominal pain and abdominal distention.  Endocrine: Negative for polydipsia.  Genitourinary: Negative for dysuria and frequency.  Musculoskeletal:       See hpi  Skin: Negative for rash.  Neurological: Positive for numbness. Negative for tremors and light-headedness.  Hematological: Does not bruise/bleed easily.  Psychiatric/Behavioral: Negative for behavioral problems and agitation.    Objective:  BP 105/72 mmHg  Pulse 91  Temp(Src) 98.6 F (37 C) (Oral)  Resp 20  Ht 5\' 1"  (1.549 m)  Wt 157 lb (71.215 kg)  BMI  29.68 kg/m2  SpO2 98%  LMP 02/20/2016  BP/Weight 02/20/2016 11/04/2015 0000000  Systolic BP 123456 123456 123456  Diastolic BP 72 72 76  Wt. (Lbs) 157 149 154  BMI 29.68 28.17 29.11      Physical Exam  Constitutional: She is oriented to person, place, and time. She appears well-developed and well-nourished.  Cardiovascular: Normal rate, normal heart sounds and intact distal pulses.   No murmur heard. Pulmonary/Chest: Effort normal and breath sounds normal. She has no wheezes. She has no rales. She exhibits no tenderness.  Abdominal: Soft. Bowel sounds are normal. She exhibits no distension and no mass. There is no tenderness.  Musculoskeletal: She exhibits tenderness (tenderness to palpation of bilateral lower extremity muscles ; inability to lift entire lower extremity above 10 degrees).  Tenderness to palpation of mid of lumbar spine  Neurological: She is alert and oriented to person, place, and time.     Assessment & Plan:   1. Asthma, mild intermittent, uncomplicated Controlled  2. Depression Uncontrolled due to pain - traZODone (DESYREL) 50 MG tablet; Take 1 tablet (50 mg total) by mouth at bedtime as needed for sleep.  Dispense: 30 tablet; Refill: 3  3. Myopathy - naproxen (NAPROSYN) 500 MG tablet; Take 1 tablet (500 mg total) by mouth 2 (two) times daily with a meal.  Dispense: 1 tablet; Refill: 3  - ANA - Rheumatoid factor - CK - Sedimentation rate - Cyclic Citrul Peptide Antibody, IGG - COMPLETE METABOLIC PANEL WITH GFR - HgB A1c  5. Midline low back pain without sciatica Will need lumbar spine imaging findings of x-ray and MRI however she has no medical coverage And is yet to obtain the [employer] discount which have advised her to work on - gabapentin (NEURONTIN) 300 MG capsule; Take 1 capsule (300 mg total) by mouth 2 (two) times daily.  Dispense: 60 capsule; Refill: 3  6. Neuropathy (HCC)   7. Other constipation - lactulose (CHRONULAC) 10 GM/15ML solution;  Take 15 mLs (10 g total) by mouth 2 (two) times daily as needed for mild constipation.  Dispense: 946 mL; Refill: 2   She would like to have her Mirena removed which I have advised that will be performed at her next visit along with completion of disability paperwork. Meds ordered this encounter  Medications  . traZODone (DESYREL) 50 MG tablet    Sig: Take 1 tablet (50 mg total) by mouth at bedtime as needed for sleep.    Dispense:  30 tablet    Refill:  3  . naproxen (NAPROSYN) 500 MG tablet    Sig: Take 1 tablet (500 mg total) by mouth 2 (two) times daily with a meal.    Dispense:  1 tablet    Refill:  3  . gabapentin (NEURONTIN) 300 MG capsule    Sig: Take 1 capsule (300 mg total) by mouth 2 (two) times daily.    Dispense:  60 capsule    Refill:  3  . lactulose (CHRONULAC) 10  GM/15ML solution    Sig: Take 15 mLs (10 g total) by mouth 2 (two) times daily as needed for mild constipation.    Dispense:  946 mL    Refill:  2    Follow-up: Return in about 2 weeks (around 03/05/2016) for removal of IUD and completion of disability paper work- 30 mins.   Arnoldo Morale MD

## 2016-02-21 LAB — CYCLIC CITRUL PEPTIDE ANTIBODY, IGG: Cyclic Citrullin Peptide Ab: <16 Units

## 2016-02-21 LAB — ANA: Anti Nuclear Antibody(ANA): NEGATIVE

## 2016-02-21 MED FILL — **ADVAIR 500/50 DISKUS: 500-50 MCG | 14 days supply | Qty: 28 | Fill #1

## 2016-02-27 ENCOUNTER — Ambulatory Visit: Payer: Self-pay

## 2016-03-02 ENCOUNTER — Ambulatory Visit: Payer: Self-pay | Attending: Family Medicine

## 2016-03-07 MED FILL — **ADVAIR 500/50 DISKUS: 500-50 MCG | 14 days supply | Qty: 28 | Fill #2

## 2016-03-13 NOTE — Telephone Encounter (Signed)
Patient was seen in the office with Amao on 02/20/16

## 2016-03-14 ENCOUNTER — Encounter: Payer: Self-pay | Admitting: Family Medicine

## 2016-03-14 ENCOUNTER — Ambulatory Visit: Payer: Self-pay | Attending: Family Medicine | Admitting: Family Medicine

## 2016-03-14 VITALS — BP 102/69 | HR 72 | Temp 98.0°F | Resp 18 | Ht 61.0 in | Wt 156.2 lb

## 2016-03-14 DIAGNOSIS — Z30432 Encounter for removal of intrauterine contraceptive device: Secondary | ICD-10-CM

## 2016-03-14 DIAGNOSIS — G729 Myopathy, unspecified: Secondary | ICD-10-CM

## 2016-03-14 DIAGNOSIS — Z0271 Encounter for disability determination: Secondary | ICD-10-CM

## 2016-03-14 DIAGNOSIS — M545 Low back pain, unspecified: Secondary | ICD-10-CM

## 2016-03-14 MED ORDER — ACETAMINOPHEN-CODEINE #3 300-30 MG PO TABS: 1.0000 | ORAL_TABLET | Freq: Three times a day (TID) | ORAL | Status: DC | PRN

## 2016-03-14 MED FILL — ACETAMINOPHEN/COD #3 TABLET: 300-30 | 10 days supply | Qty: 30 | Fill #0

## 2016-03-14 NOTE — Progress Notes (Signed)
Subjective:  Patient ID: [name], female    DOB: 17-Mar-1971  Age: 45 y.o. MRN: [id number]  CC: completion of disability paper work.  HPI [name] is a 45 year old female with a history of asthma, hypertension, depression who comes into the clinic complaining for completion of disability paperwork from her attorney due to worsening bilateral lower extremity pain which she has had for several months.  Pain starts in bilateral LE and radiates up to her thighs and up to the lower back and she has noticed progressive difficulty with ambulation to the point where she has to use a cane in her legs give out on sometimes making her fall. Pain is sharp, wakes her up at night and is associated with numbness.  She would also like to have her IUD removed.  Outpatient Prescriptions Prior to Visit  Medication Sig Dispense Refill  . albuterol (PROVENTIL HFA;VENTOLIN HFA) 108 (90 BASE) MCG/ACT inhaler Inhale 2 puffs into the lungs daily as needed for shortness of breath. 3 Inhaler 3  . FLUoxetine (PROZAC) 40 MG capsule Take 1 capsule (40 mg total) by mouth daily. 30 capsule 3  . Fluticasone-Salmeterol (ADVAIR DISKUS) 500-50 MCG/DOSE AEPB Inhale 1 puff into the lungs 2 (two) times daily. 180 each 3  . gabapentin (NEURONTIN) 300 MG capsule Take 1 capsule (300 mg total) by mouth 2 (two) times daily. 60 capsule 3  . ipratropium (ATROVENT HFA) 17 MCG/ACT inhaler Inhale 2 puffs into the lungs 2 (two) times daily. 1 Inhaler 12  . lactulose (CHRONULAC) 10 GM/15ML solution Take 15 mLs (10 g total) by mouth 2 (two) times daily as needed for mild constipation. 946 mL 2  . levocetirizine (XYZAL) 5 MG tablet Take 1 tablet (5 mg total) by mouth every evening. 30 tablet 0  . naproxen (NAPROSYN) 500 MG tablet Take 1 tablet (500 mg total) by mouth 2 (two) times daily with a meal. 1 tablet 3  . traZODone (DESYREL) 50 MG tablet Take 1 tablet (50 mg total) by mouth at bedtime as needed for sleep. 30 tablet 3  .  triamcinolone cream (KENALOG) 0.1 % Apply 1 application topically 2 (two) times daily. 30 g 0  . acetaminophen-codeine (TYLENOL #3) 300-30 MG tablet Take 1 tablet by mouth every 8 (eight) hours as needed for moderate pain. 60 tablet 0  . diphenhydrAMINE (BENADRYL) 25 MG tablet Take 1 tablet (25 mg total) by mouth every 6 (six) hours. 20 tablet 0   No facility-administered medications prior to visit.    ROS Review of Systems Constitutional: Negative for activity change, appetite change and fatigue.  HENT: Negative for congestion, sinus pressure and sore throat.   Eyes: Negative for visual disturbance.  Respiratory: Negative for cough, chest tightness, shortness of breath and wheezing.   Cardiovascular: Negative for chest pain and palpitations.  Gastrointestinal: Negative for abdominal pain and abdominal distention.  Endocrine: Negative for polydipsia.  Genitourinary: Negative for dysuria and frequency.  Musculoskeletal:       See hpi  Skin: Negative for rash.  Neurological: Positive for numbness. Negative for tremors and light-headedness.  Hematological: Does not bruise/bleed easily.  Psychiatric/Behavioral: Negative for behavioral problems and agitation.   Objective:  BP 102/69 mmHg  Pulse 72  Temp(Src) 98 F (36.7 C) (Oral)  Resp 18  Ht 5\' 1"  (1.549 m)  Wt 156 lb 3.2 oz (70.852 kg)  BMI 29.53 kg/m2  SpO2 99%  LMP 02/20/2016  BP/Weight 03/14/2016 02/20/2016 [id number]  Systolic BP [id number] 123456 123456  Diastolic BP 69 72 72  Wt. (Lbs) 156.2 157 149  BMI 29.53 29.68 28.17      Physical Exam Constitutional: She is oriented to person, place, and time. She appears well-developed and well-nourished.  Cardiovascular: Normal rate, normal heart sounds and intact distal pulses.   No murmur heard. Pulmonary/Chest: Effort normal and breath sounds normal. She has no wheezes. She has no rales. She exhibits no tenderness.  Abdominal: Soft. Bowel sounds are normal. She exhibits no distension  and no mass. There is no tenderness.  Genitourinary: IUD string visible, associated brownish colored blood.  Musculoskeletal: She exhibits tenderness (tenderness to palpation of bilateral lower extremity muscles ; inability to lift entire lower extremity actively).  Tenderness to palpation of mid of lumbar spine  Neurological: She is alert and oriented to person, place, and time.    Assessment & Plan:   1. Encounter for IUD removal Removed IUD with the aid of an oval forceps and patient tolerated procedure well Discussed pros and cons of the various contraceptive options and the patient would love to go with Depo-Provera shot however she needs to think about it and will get back to the clinic - Cath Tip Culture  2. Myopathy Unknown etiology - DG Lumbar Spine Complete; Future  3. Midline low back pain without sciatica We'll consider MRI at next visit. - acetaminophen-codeine (TYLENOL #3) 300-30 MG tablet; Take 1 tablet by mouth every 8 (eight) hours as needed for moderate pain.  Dispense: 30 tablet; Refill: 0 - DG Lumbar Spine Complete; Future  4. Disability examination Completed disability forms and a copy made for chart    Meds ordered this encounter  Medications  . acetaminophen-codeine (TYLENOL #3) 300-30 MG tablet    Sig: Take 1 tablet by mouth every 8 (eight) hours as needed for moderate pain.    Dispense:  30 tablet    Refill:  0    Follow-up: Return in about 2 years (around 03/14/2018) for follow up of low back pain.   Arnoldo Morale MD

## 2016-03-14 NOTE — Progress Notes (Signed)
Bilateral leg pain 8/10 IUD removal Medication refills

## 2016-03-21 ENCOUNTER — Ambulatory Visit (HOSPITAL_COMMUNITY)
Admission: RE | Admit: 2016-03-21 | Discharge: 2016-03-21 | Disposition: A | Discharge status: Home / Self Care | Payer: Self-pay | Source: Ambulatory Visit | Attending: Family Medicine | Admitting: Family Medicine

## 2016-03-21 ENCOUNTER — Telehealth: Payer: Self-pay

## 2016-03-21 ENCOUNTER — Other Ambulatory Visit: Payer: Self-pay | Admitting: Internal Medicine

## 2016-03-21 DIAGNOSIS — M545 Low back pain, unspecified: Secondary | ICD-10-CM

## 2016-03-21 DIAGNOSIS — M479 Spondylosis, unspecified: Secondary | ICD-10-CM | POA: Insufficient documentation

## 2016-03-21 DIAGNOSIS — G729 Myopathy, unspecified: Secondary | ICD-10-CM | POA: Insufficient documentation

## 2016-03-21 MED FILL — FLUoxetine HCL 40 MG CAPS: 40 | 30 days supply | Qty: 30 | Fill #1

## 2016-03-21 MED FILL — LACTULOSE 10 GM/15 ML SOLN: 10 | 30 days supply | Qty: 946 | Fill #1

## 2016-03-21 MED FILL — NAPROXEN 500 MG TABLET: 500 | 30 days supply | Qty: 60 | Fill #1

## 2016-03-21 MED FILL — **ADVAIR 500/50 DISKUS: 500-50 MCG | 14 days supply | Qty: 28 | Fill #3

## 2016-03-21 MED FILL — GABAPENTIN 300 MG CAPSULE: 300 | 30 days supply | Qty: 60 | Fill #1

## 2016-03-21 MED FILL — traZODone HCL 50 MG TABS: 50 | 30 days supply | Qty: 30 | Fill #1

## 2016-03-21 NOTE — Telephone Encounter (Signed)
Spoke with lab representative states they had to change Tip culture order to "Anaerobic Culture".  Message sent to ordering provider for review. Priscille Heidelberg, RN, BSN

## 2016-03-22 ENCOUNTER — Telehealth: Payer: Self-pay

## 2016-03-22 NOTE — Telephone Encounter (Signed)
Writer called patient to give her lab results and was unable to LVM - her phone continuously rings then disconnects.

## 2016-03-22 NOTE — Telephone Encounter (Signed)
-----   Message from Arnoldo Morale, MD sent at 03/21/2016  4:22 PM EDT ----- X-ray reveals mild degenerative changes.

## 2016-03-29 LAB — ANAEROBIC CULTURE

## 2016-04-03 ENCOUNTER — Telehealth: Payer: Self-pay

## 2016-04-03 NOTE — Telephone Encounter (Signed)
Letter generated

## 2016-04-03 NOTE — Telephone Encounter (Signed)
-----   Message from Arnoldo Morale, MD sent at 03/21/2016  4:22 PM EDT ----- X-ray reveals mild degenerative changes.

## 2016-04-06 MED FILL — **ADVAIR 500/50 DISKUS: 500-50 MCG | 14 days supply | Qty: 28 | Fill #4

## 2016-04-11 ENCOUNTER — Other Ambulatory Visit: Payer: Self-pay | Admitting: Family Medicine

## 2016-04-11 NOTE — Telephone Encounter (Signed)
Pt. Called requesting a refill on Albuterol. I did not see that it is in her current medication list. Please f/u with pt.

## 2016-04-11 NOTE — Telephone Encounter (Signed)
Pt. Called requesting a refill on Tylenol # 3. Please f/u °

## 2016-04-12 MED ORDER — ALBUTEROL SULFATE HFA 108 (90 BASE) MCG/ACT IN AERS
2.0000 | INHALATION_SPRAY | Freq: Four times a day (QID) | RESPIRATORY_TRACT | 3 refills | Status: DC | PRN
Start: 2016-04-12 — End: 2016-08-15

## 2016-04-12 MED FILL — VENTOLIN HFA 90 MCG INHALER: 108 (90 BAS | 30 days supply | Qty: 18 | Fill #0

## 2016-04-12 NOTE — Telephone Encounter (Signed)
Needs an office visit so an MRI of her back can be ordered and she will have to sign a pain contract after which Tylenol#3 will be prescribed. I have written a rx for albuterol.

## 2016-04-13 NOTE — Telephone Encounter (Signed)
Pt requesting her Tylenol #3 refill  Pt states she signed pain contract at her last visit on 6/28  Pt has been made an appointment with PCP in August

## 2016-04-16 ENCOUNTER — Other Ambulatory Visit: Payer: Self-pay

## 2016-04-16 DIAGNOSIS — M545 Low back pain, unspecified: Secondary | ICD-10-CM

## 2016-04-16 MED ORDER — ACETAMINOPHEN-CODEINE #3 300-30 MG PO TABS: 1.0000 | ORAL_TABLET | Freq: Three times a day (TID) | ORAL | 0 refills | Status: DC | PRN

## 2016-04-16 MED FILL — **ADVAIR 500/50 DISKUS: 500-50 MCG | 14 days supply | Qty: 28 | Fill #5

## 2016-04-16 MED FILL — ACETAMINOPHEN/COD #3 TABLET: 300-30 | 100 days supply | Qty: 30 | Fill #0

## 2016-04-16 NOTE — Telephone Encounter (Signed)
Done

## 2016-04-16 NOTE — Telephone Encounter (Signed)
Pt calling again stating that she is in extreme pain and is requesting her refill on Tylenol #3   Pt states again that she signed a pain contract and is willing to come to office with a copy of contract if needed  Please assist, thank you

## 2016-04-30 ENCOUNTER — Ambulatory Visit: Payer: Self-pay | Attending: Family Medicine | Admitting: Family Medicine

## 2016-04-30 ENCOUNTER — Encounter: Payer: Self-pay | Admitting: Family Medicine

## 2016-04-30 VITALS — BP 97/63 | HR 78 | Temp 98.1°F | Ht 61.0 in | Wt 159.4 lb

## 2016-04-30 DIAGNOSIS — D573 Sickle-cell trait: Secondary | ICD-10-CM | POA: Insufficient documentation

## 2016-04-30 DIAGNOSIS — F329 Major depressive disorder, single episode, unspecified: Secondary | ICD-10-CM | POA: Insufficient documentation

## 2016-04-30 DIAGNOSIS — I10 Essential (primary) hypertension: Secondary | ICD-10-CM | POA: Insufficient documentation

## 2016-04-30 DIAGNOSIS — M5416 Radiculopathy, lumbar region: Secondary | ICD-10-CM | POA: Insufficient documentation

## 2016-04-30 DIAGNOSIS — J45909 Unspecified asthma, uncomplicated: Secondary | ICD-10-CM | POA: Insufficient documentation

## 2016-04-30 DIAGNOSIS — M5010 Cervical disc disorder with radiculopathy, unspecified cervical region: Secondary | ICD-10-CM

## 2016-04-30 DIAGNOSIS — M5441 Lumbago with sciatica, right side: Secondary | ICD-10-CM

## 2016-04-30 DIAGNOSIS — N952 Postmenopausal atrophic vaginitis: Secondary | ICD-10-CM | POA: Insufficient documentation

## 2016-04-30 MED ORDER — ESTRADIOL 10 MCG VA TABS: 10.0000 ug | ORAL_TABLET | Freq: Every day | VAGINAL | 1 refills | Status: DC

## 2016-04-30 MED ORDER — METHOCARBAMOL 500 MG PO TABS
500.0000 mg | ORAL_TABLET | Freq: Two times a day (BID) | ORAL | 2 refills | Status: DC | PRN
Start: 2016-04-30 — End: 2016-08-15

## 2016-04-30 MED FILL — **ADVAIR 500/50 DISKUS: 500-50 MCG | 14 days supply | Qty: 28 | Fill #6

## 2016-04-30 MED FILL — !VAGIFEM 10 MCG VAGINAL TAB: 10 | 22 days supply | Qty: 30 | Fill #0

## 2016-04-30 MED FILL — METHOCARBAMOL 500 MG TABLET: 500 | 15 days supply | Qty: 60 | Fill #0

## 2016-04-30 NOTE — Progress Notes (Signed)
Numbness radiating down arm - one month Vaginal dryness prior to  Intercourse Worsened pain - wakes patient up in the middle of the night Naproxen causes stomach pain- stopped taking last month

## 2016-04-30 NOTE — Progress Notes (Signed)
Subjective:    Patient ID: [name], female    DOB: 21-Feb-1971, 45 y.o.   MRN: [id number]  HPI She is a 45 year old female with a history of asthma, hypertension, depression who comes into the clinic complaining of worsening bilateral lower extremity pain which she has had for several months.  Pain starts in bilateral LE and radiates up to her thighs and up to the lower back and she has noticed progressive difficulty with ambulation to the point where she has to use a cane and her legs give out on her sometimes making her fall. Pain is sharp, wakes her up at night and is associated with numbness. Also has a upper back pain with radiation down her whole right arm making it difficult to make a fist in her right arm Currently on Naproxen and Tylenol #3 but complains that the former causes abdominal pain X-ray of lumbar spine revealed mild degenerative changes.  Complains of vaginal dryness and would like to have a treatment for it as over-the-counter K-Y jelly has not worked.  Past Medical History:  Diagnosis Date  . Allergy    pollen, pcn  . Asthma   . Depression   . Fibroids   . History of ovarian cyst   . Medical history non-contributory    sickle cell  . Menometrorrhagia   . 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 Sickle cell trait [facility])     Past Surgical History:  Procedure Laterality Date  . CESAREAN SECTION     all three pregnancies    Allergies  Allergen Reactions  . Bupropion Hcl Er (Sr) Hives and Other (See Comments)    The patient has an entire page of reactions that can be caused by the medication, but none that she has suffered.  . Fruit & Vegetable Daily [Nutritional Supplements] Other (See Comments)    "all fruits causes bumps and weird feeling in her mouth"  . Penicillins Hives  . Pollen Extract-Tree Extract Other (See Comments)    Allergies  . Other Rash    "bugs" roaches      Review of Systems Constitutional: Negative for activity  change, appetite change and fatigue.  HENT: Negative for congestion, sinus pressure and sore throat.   Eyes: Negative for visual disturbance.  Respiratory: Negative for cough, chest tightness, shortness of breath and wheezing.   Cardiovascular: Negative for chest pain and palpitations.  Gastrointestinal: . Negative for abdominal pain and abdominal distention.  Endocrine: Negative for polydipsia.  Genitourinary: Negative for dysuria and frequency. Positive for vaginal dryness Musculoskeletal:       See hpi  Skin: Negative for rash.  Neurological: Positive for numbness. Negative for tremors and light-headedness.  Hematological: Does not bruise/bleed easily.  Psychiatric/Behavioral: Negative for behavioral problems and agitation.       Objective: Vitals:   04/30/16 1230  BP: 97/63  Pulse: 78  Temp: 98.1 F (36.7 C)  TempSrc: Oral  SpO2: 96%  Weight: 159 lb 6.4 oz (72.3 kg)  Height: 5\' 1"  (1.549 m)      Physical Exam Constitutional: She is oriented to person, place, and time. She appears well-developed and well-nourished.  Cardiovascular: Normal rate, normal heart sounds and intact distal pulses.   No murmur heard. Pulmonary/Chest: Effort normal and breath sounds normal. She has no wheezes. She has no rales. She exhibits no tenderness.  Abdominal: Soft. Bowel sounds are normal. She exhibits no distension and no mass. There is no tenderness.  Genitourinary: IUD string visible, associated brownish colored blood.  Musculoskeletal: She exhibits tenderness (tenderness to palpation of bilateral lower extremity muscles ; inability to lift entire lower extremity actively).  Tenderness to palpation of Cervical, thoracic and lumbar spine  Neurological: She is alert and oriented to person, place, and time.         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   Vaginal atrophy: Educated about risk and benefits of HRT She would like to try vagifem- educated that treatment is recommended for shortest possible duration and risk involved including cancer Will consider adding progesterone at next visit Last PAP was normal in 12/2014

## 2016-05-07 ENCOUNTER — Ambulatory Visit (HOSPITAL_COMMUNITY)
Admission: RE | Admit: 2016-05-07 | Discharge: 2016-05-07 | Disposition: A | Discharge status: Home / Self Care | Payer: Self-pay | Source: Ambulatory Visit | Attending: Family Medicine | Admitting: Family Medicine

## 2016-05-07 DIAGNOSIS — M5010 Cervical disc disorder with radiculopathy, unspecified cervical region: Secondary | ICD-10-CM | POA: Insufficient documentation

## 2016-05-07 DIAGNOSIS — M5441 Lumbago with sciatica, right side: Secondary | ICD-10-CM

## 2016-05-07 DIAGNOSIS — M4806 Spinal stenosis, lumbar region: Secondary | ICD-10-CM | POA: Insufficient documentation

## 2016-05-07 DIAGNOSIS — M4802 Spinal stenosis, cervical region: Secondary | ICD-10-CM | POA: Insufficient documentation

## 2016-05-07 DIAGNOSIS — M47814 Spondylosis without myelopathy or radiculopathy, thoracic region: Secondary | ICD-10-CM | POA: Insufficient documentation

## 2016-05-14 ENCOUNTER — Telehealth: Payer: Self-pay

## 2016-05-14 NOTE — Telephone Encounter (Signed)
Non-working number, message states the number you have dialed is not in service.  Letter sent.  Priscille Heidelberg, RN, BSN

## 2016-05-14 NOTE — Telephone Encounter (Signed)
-----   Message from Arnoldo Morale, MD sent at 05/10/2016  1:49 PM EDT ----- MRI cervical, thoracic, lumbar spine reveals multilevel spine narrowing which could explain her symptoms. I will discuss this further at her next office visit.

## 2016-05-15 ENCOUNTER — Ambulatory Visit: Payer: Self-pay | Attending: Family Medicine | Admitting: Family Medicine

## 2016-05-15 ENCOUNTER — Encounter: Payer: Self-pay | Admitting: Family Medicine

## 2016-05-15 VITALS — BP 106/73 | HR 79 | Temp 98.0°F | Ht 61.0 in | Wt 162.6 lb

## 2016-05-15 DIAGNOSIS — Z79899 Other long term (current) drug therapy: Secondary | ICD-10-CM | POA: Insufficient documentation

## 2016-05-15 DIAGNOSIS — M4806 Spinal stenosis, lumbar region: Secondary | ICD-10-CM | POA: Insufficient documentation

## 2016-05-15 DIAGNOSIS — M5010 Cervical disc disorder with radiculopathy, unspecified cervical region: Secondary | ICD-10-CM | POA: Insufficient documentation

## 2016-05-15 DIAGNOSIS — F329 Major depressive disorder, single episode, unspecified: Secondary | ICD-10-CM | POA: Insufficient documentation

## 2016-05-15 DIAGNOSIS — Z9889 Other specified postprocedural states: Secondary | ICD-10-CM | POA: Insufficient documentation

## 2016-05-15 DIAGNOSIS — F32A Depression, unspecified: Secondary | ICD-10-CM

## 2016-05-15 DIAGNOSIS — J4530 Mild persistent asthma, uncomplicated: Secondary | ICD-10-CM

## 2016-05-15 DIAGNOSIS — Z1239 Encounter for other screening for malignant neoplasm of breast: Secondary | ICD-10-CM

## 2016-05-15 DIAGNOSIS — M48062 Spinal stenosis, lumbar region with neurogenic claudication: Secondary | ICD-10-CM

## 2016-05-15 DIAGNOSIS — I739 Peripheral vascular disease, unspecified: Secondary | ICD-10-CM | POA: Insufficient documentation

## 2016-05-15 DIAGNOSIS — Z13228 Encounter for screening for other metabolic disorders: Secondary | ICD-10-CM

## 2016-05-15 DIAGNOSIS — Z888 Allergy status to other drugs, medicaments and biological substances status: Secondary | ICD-10-CM | POA: Insufficient documentation

## 2016-05-15 DIAGNOSIS — Z880 Allergy status to penicillin: Secondary | ICD-10-CM | POA: Insufficient documentation

## 2016-05-15 DIAGNOSIS — N952 Postmenopausal atrophic vaginitis: Secondary | ICD-10-CM | POA: Insufficient documentation

## 2016-05-15 DIAGNOSIS — J45909 Unspecified asthma, uncomplicated: Secondary | ICD-10-CM | POA: Insufficient documentation

## 2016-05-15 LAB — LIPID PANEL
CHOL/HDL RATIO: 3 ratio (ref <=5.0)
Cholesterol: 148 mg/dL (ref 125–200)
HDL: 50 mg/dL (ref >=46)
LDL Cholesterol: 85 mg/dL (ref <130)
TRIGLYCERIDES: 65 mg/dL (ref <150)
VLDL: 13 mg/dL (ref <30)

## 2016-05-15 MED ORDER — TRAZODONE HCL 50 MG PO TABS: 50.0000 mg | ORAL_TABLET | Freq: Every evening | ORAL | 3 refills | Status: DC | PRN

## 2016-05-15 MED ORDER — FLUOXETINE HCL 40 MG PO CAPS: 40.0000 mg | ORAL_CAPSULE | Freq: Every day | ORAL | 3 refills | Status: DC

## 2016-05-15 MED ORDER — FLUTICASONE-SALMETEROL 500-50 MCG/DOSE IN AEPB: 1.0000 | INHALATION_SPRAY | Freq: Two times a day (BID) | RESPIRATORY_TRACT | 3 refills | Status: DC

## 2016-05-15 MED ORDER — MEDROXYPROGESTERONE ACETATE 10 MG PO TABS: 10.0000 mg | ORAL_TABLET | Freq: Every day | ORAL | 1 refills | Status: DC

## 2016-05-15 MED ORDER — GABAPENTIN 300 MG PO CAPS: 300.0000 mg | ORAL_CAPSULE | Freq: Two times a day (BID) | ORAL | 3 refills | Status: DC

## 2016-05-15 MED ORDER — ACETAMINOPHEN-CODEINE #3 300-30 MG PO TABS: 1.0000 | ORAL_TABLET | Freq: Two times a day (BID) | ORAL | 1 refills | Status: DC | PRN

## 2016-05-15 MED FILL — FLUoxetine HCL 40 MG CAPS: 40 | 30 days supply | Qty: 30 | Fill #2

## 2016-05-15 MED FILL — METHOCARBAMOL 500 MG TABLET: 500 | 15 days supply | Qty: 60 | Fill #1

## 2016-05-15 MED FILL — VENTOLIN HFA 90 MCG INHALER: 108 (90 BAS | 30 days supply | Qty: 18 | Fill #1

## 2016-05-15 MED FILL — GABAPENTIN 300 MG CAPSULE: 300 | 30 days supply | Qty: 60 | Fill #0

## 2016-05-15 MED FILL — ADVAIR 500/50 DISKUS: 500-50 | 30 days supply | Qty: 60 | Fill #7

## 2016-05-15 MED FILL — ACETAMINOPHEN/COD #3 TABLET: 300-30 | 30 days supply | Qty: 60 | Fill #0

## 2016-05-15 MED FILL — MEDROXYPROGESTERONE 10 MG T: 10 | 10 days supply | Qty: 10 | Fill #0

## 2016-05-15 MED FILL — traZODone HCL 50 MG TABS: 50 | 30 days supply | Qty: 30 | Fill #0

## 2016-05-15 NOTE — Progress Notes (Signed)
Subjective:  Patient ID: [name], female    DOB: 06-13-1971  Age: 45 y.o. MRN: [id number]  CC: Back Pain   HPI [name] She is a 45 year old female with a history of asthma, hypertension, depression who comes into the clinic complaining of worsening bilateral lower extremity pain which she has had for several months.  Pain starts in bilateral LE and radiates up to her thighs and up to the lower back and she has noticed progressive difficulty with ambulation to the point where she has to use a cane and her legs give out on her sometimes making her fall. Pain is sharp, wakes her up at night and is associated with numbness. Also has a upper back pain with radiation down her whole right arm making it difficult to make a fist in her right arm Currently on Naproxen and Tylenol #3 but complains that the former causes abdominal pain Recent MRI of cervical, thoracic and lumbar spine revealed canal stenosis at C4-5 and C5-6, and neural foraminal narrowing which is moderate to severe on the right at C5-6, degenerative changes and thoracic spine, L3-L4 and L4-L5 neural foraminal narrowing.   She had also complained of vaginal dryness at her last office visit for which I placed her on Vagifem. Past Medical History:  Diagnosis Date  . Allergy    pollen, pcn  . Asthma   . Depression   . Fibroids   . History of ovarian cyst   . Medical history non-contributory    sickle cell  . Menometrorrhagia   . 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 Sickle cell trait [facility])     Past Surgical History:  Procedure Laterality Date  . CESAREAN SECTION     all three pregnancies    Allergies  Allergen Reactions  . Bupropion Hcl Er (Sr) Hives and Other (See Comments)    The patient has an entire page of reactions that can be caused by the medication, but none that she has suffered.  . Fruit & Vegetable Daily [Nutritional Supplements] Other (See Comments)    "all fruits causes bumps and  weird feeling in her mouth"  . Penicillins Hives  . Pollen Extract-Tree Extract Other (See Comments)    Allergies  . Other Rash    "bugs" roaches     Outpatient Medications Prior to Visit  Medication Sig Dispense Refill  . albuterol (VENTOLIN HFA) 108 (90 Base) MCG/ACT inhaler Inhale 2 puffs into the lungs every 6 (six) hours as needed for wheezing or shortness of breath. 18 g 3  . Estradiol (VAGIFEM) 10 MCG TABS vaginal tablet Place 1 tablet (10 mcg total) vaginally daily. For 2 weeks then twice weekly thereafter 30 tablet 1  . ipratropium (ATROVENT HFA) 17 MCG/ACT inhaler Inhale 2 puffs into the lungs 2 (two) times daily. 1 Inhaler 12  . lactulose (CHRONULAC) 10 GM/15ML solution Take 15 mLs (10 g total) by mouth 2 (two) times daily as needed for mild constipation. 946 mL 2  . levocetirizine (XYZAL) 5 MG tablet Take 1 tablet (5 mg total) by mouth every evening. 30 tablet 0  . methocarbamol (ROBAXIN) 500 MG tablet Take 1 tablet (500 mg total) by mouth 2 (two) times daily as needed for muscle spasms. 60 tablet 2  . triamcinolone cream (KENALOG) 0.1 % Apply 1 application topically 2 (two) times daily. 30 g 0  . acetaminophen-codeine (TYLENOL #3) 300-30 MG tablet Take 1 tablet by mouth every 8 (eight)  hours as needed for moderate pain. 30 tablet 0  . FLUoxetine (PROZAC) 40 MG capsule Take 1 capsule (40 mg total) by mouth daily. 30 capsule 3  . Fluticasone-Salmeterol (ADVAIR DISKUS) 500-50 MCG/DOSE AEPB Inhale 1 puff into the lungs 2 (two) times daily. 180 each 3  . gabapentin (NEURONTIN) 300 MG capsule Take 1 capsule (300 mg total) by mouth 2 (two) times daily. 60 capsule 3  . traZODone (DESYREL) 50 MG tablet Take 1 tablet (50 mg total) by mouth at bedtime as needed for sleep. 30 tablet 3   No facility-administered medications prior to visit.     ROS Review of Systems  Constitutional: Negative for activity change, appetite change and fatigue.  HENT: Negative for congestion, sinus pressure  and sore throat.   Eyes: Negative for visual disturbance.  Respiratory: Negative for cough, chest tightness, shortness of breath and wheezing.   Cardiovascular: Negative for chest pain and palpitations.  Gastrointestinal: Negative for abdominal distention, abdominal pain and constipation.  Endocrine: Negative for polydipsia.  Genitourinary: Negative for dysuria and frequency.  Musculoskeletal:       See hpi  Skin: Negative for rash.  Neurological: Positive for weakness and numbness. Negative for tremors and light-headedness.  Hematological: Does not bruise/bleed easily.  Psychiatric/Behavioral: Negative for agitation and behavioral problems.    Objective:  BP 106/73 (BP Location: Right Arm, Patient Position: Sitting, Cuff Size: Large)   Pulse 79   Temp 98 F (36.7 C) (Oral)   Ht 5\' 1"  (1.549 m)   Wt 162 lb 9.6 oz (73.8 kg)   LMP 03/30/2016   SpO2 100%   BMI 30.72 kg/m   BP/Weight 05/15/2016 04/30/2016 99991111  Systolic BP [id number] 97 [id number]  Diastolic BP 73 63 69  Wt. (Lbs) 162.6 159.4 156.2  BMI 30.72 30.12 29.53  Some encounter information is confidential and restricted. Go to Review Flowsheets activity to see all data.      Physical Exam Constitutional: She is oriented to person, place, and time. She appears well-developed and well-nourished.  Cardiovascular: Normal rate, normal heart sounds and intact distal pulses.   No murmur heard. Pulmonary/Chest: Effort normal and breath sounds normal. She has no wheezes. She has no rales. She exhibits no tenderness.  Abdominal: Soft. Bowel sounds are normal. She exhibits no distension and no mass. There is no tenderness.  Genitourinary: IUD string visible, associated brownish colored blood.  Musculoskeletal: She exhibits tenderness (tenderness to palpation of bilateral lower extremity muscles ; inability to lift entire lower extremity actively).  Tenderness to palpation of Cervical, thoracic and lumbar spine  Neurological: She is alert  and oriented to person, place, and time.   CLINICAL DATA:  Worsening bilateral lower extremity pain for several months. Pain occasionally radiates from thighs to lower back. Intermittent leg weakness resulting in falls. Upper back pain radiating to RIGHT arm for 2 months. No injury.  EXAM: MRI CERVICAL, THORACIC AND LUMBAR SPINE WITHOUT CONTRAST  TECHNIQUE: Multiplanar and multiecho pulse sequences of the cervical spine, to include the craniocervical junction and cervicothoracic junction, and thoracic and lumbar spine, were obtained without intravenous contrast.  COMPARISON:  None.  FINDINGS: MRI CERVICAL SPINE FINDINGS  ALIGNMENT: Broad reversed cervical lordosis.  No malalignment.  VERTEBRAE/DISCS: Vertebral bodies are intact. Mild C5-6 disc height loss and mild chronic discogenic endplate changes. No abnormal bone marrow signal to suggest acute osseous process.  CORD:Cervical spinal cord is normal morphology and signal characteristics from the cervicomedullary junction to level of T1-2, the most caudal well  visualized level.  POSTERIOR FOSSA, VERTEBRAL ARTERIES, PARASPINAL TISSUES: No MR findings of ligamentous injury. Vertebral artery flow voids present. Included posterior fossa and paraspinal soft tissues are normal.Partially imaged LEFT maxillary sinus mucosal retention cyst.  DISC LEVELS:  C2-3: No disc bulge, canal stenosis nor neural foraminal narrowing.  C3-4: Small central disc protrusion. Uncovertebral hypertrophy without canal stenosis or neural foraminal narrowing.  C4-5: Small central disc protrusion. Uncovertebral hypertrophy. Mild canal stenosis. Mild LEFT neural foraminal narrowing.  C5-6: Moderate broad-based disc bulge eccentric laterally. Uncovertebral hypertrophy. Mild canal stenosis. Moderate to severe RIGHT, moderate LEFT neural foraminal narrowing.  C6-7: No disc bulge, canal stenosis nor neural foraminal narrowing.  C7-T1:  Annular bulging eccentric bilaterally without canal stenosis. Mild bilateral neural foraminal narrowing.  MRI THORACIC SPINE FINDINGS- Mild motion degraded examination.  ALIGNMENT: Maintenance of the thoracic kyphosis. No malalignment.  VERTEBRAE/DISCS: Vertebral bodies are intact. Intervertebral discs demonstrate normal morphology and signal. No abnormal bone marrow signal to suggest acute osseous process.  CORD: Thoracic spinal cord is normal morphology and signal characteristics to the level of the conus medullaris which terminates at T12-L1.  PREVERTEBRAL AND PARASPINAL SOFT TISSUES:  Normal.  DISC LEVELS:  At T5-6 is moderate asymmetric LEFT facet arthropathy.  At T6-7 is small LEFT central disc protrusion.  At T7-8 is annular bulging.  At T9-10 is small LEFT central to subarticular disc protrusion.  Multilevel mild facet arthropathy. No canal stenosis or neural foraminal narrowing at any level.  MRI LUMBAR SPINE FINDINGS  SEGMENTATION: For the purposes of this report, the last well-formed intervertebral disc will be described as L5-S1.  ALIGNMENT: No malalignment.  Maintenance of the lumbar lordosis.  VERTEBRAE:Lumbar vertebral bodies are intact. Intervertebral discs demonstrate normal morphology and signal characteristics. No abnormal bone marrow signal.  CONUS MEDULLARIS: Conus medullaris terminates at L1-2 and demonstrates normal morphology and signal characteristics. Cauda equina is normal.  PARASPINAL AND SOFT TISSUES: Included prevertebral and paraspinal soft tissues are normal.  DISC LEVELS:  L1-2 and L2-3: No significant disc bulge. Mild facet arthropathy without canal stenosis or neural foraminal narrowing.  L3-4: Small broad-based disc bulge. Moderate to severe facet arthropathy and ligamentum flavum redundancy without canal stenosis. Moderate bilateral neural foraminal narrowing.  L4-5: Small broad-based disc bulge. Severe  facet arthropathy and ligamentum flavum redundancy without canal stenosis. Moderate bilateral neural foraminal narrowing.  L5-S1: No disc bulge. Moderate facet arthropathy without canal stenosis or neural foraminal narrowing.  IMPRESSION: MRI CERVICAL SPINE:Mild canal stenosis C4-5 and C5-6. Neural foraminal narrowing C4-5 and C5-6: Moderate to severe on the RIGHT at C5-6.  MRI THORACIC SPINE: Mild degenerative change without canal stenosis or neural foraminal narrowing.  MRI LUMBAR SPINE: No canal stenosis. Moderate bilateral L3-4 and L4-5 neural foraminal narrowing.   Electronically Signed   By: Elon Alas M.D.   On: 05/07/2016 17:20   Assessment & Plan:   1. Asthma, mild persistent, uncomplicated Stable - Fluticasone-Salmeterol (ADVAIR DISKUS) 500-50 MCG/DOSE AEPB; Inhale 1 puff into the lungs 2 (two) times daily.  Dispense: 180 each; Refill: 3  2. Depression Stable - FLUoxetine (PROZAC) 40 MG capsule; Take 1 capsule (40 mg total) by mouth daily.  Dispense: 30 capsule; Refill: 3 - traZODone (DESYREL) 50 MG tablet; Take 1 tablet (50 mg total) by mouth at bedtime as needed for sleep.  Dispense: 30 tablet; Refill: 3  3. Spinal stenosis, lumbar region, with neurogenic claudication Uncontrolled with severe pain - acetaminophen-codeine (TYLENOL #3) 300-30 MG tablet; Take 1 tablet by mouth every  12 (twelve) hours as needed for moderate pain.  Dispense: 60 tablet; Refill: 1 - gabapentin (NEURONTIN) 300 MG capsule; Take 1 capsule (300 mg total) by mouth 2 (two) times daily.  Dispense: 60 capsule; Refill: 3 - Ambulatory referral to Spine Surgery  4. Cervical disc disorder with radiculopathy of cervical region This could explain right arm pain - Ambulatory referral to Spine Surgery  5. Vaginal atrophy Currently on Vagifem Discussed risk of cancer and patient is aware and would like to continue with it. Educated to use Vagifem for 2 weeks on that then 2 weeks off  at the time she would use the Provera panel and repeat cycle next month-will need to use for shortness possible duration Pap smear not due until 2019 - medroxyPROGESTERone (PROVERA) 10 MG tablet; Take 1 tablet (10 mg total) by mouth daily. Take on days when off Vagifem  Dispense: 10 tablet; Refill: 1  6. Screening for breast cancer - MM Digital Screening; Future  7. Screening for metabolic disorder - Lipid Panel   Meds ordered this encounter  Medications  . acetaminophen-codeine (TYLENOL #3) 300-30 MG tablet    Sig: Take 1 tablet by mouth every 12 (twelve) hours as needed for moderate pain.    Dispense:  60 tablet    Refill:  1  . Fluticasone-Salmeterol (ADVAIR DISKUS) 500-50 MCG/DOSE AEPB    Sig: Inhale 1 puff into the lungs 2 (two) times daily.    Dispense:  180 each    Refill:  3  . FLUoxetine (PROZAC) 40 MG capsule    Sig: Take 1 capsule (40 mg total) by mouth daily.    Dispense:  30 capsule    Refill:  3  . traZODone (DESYREL) 50 MG tablet    Sig: Take 1 tablet (50 mg total) by mouth at bedtime as needed for sleep.    Dispense:  30 tablet    Refill:  3  . gabapentin (NEURONTIN) 300 MG capsule    Sig: Take 1 capsule (300 mg total) by mouth 2 (two) times daily.    Dispense:  60 capsule    Refill:  3  . medroxyPROGESTERone (PROVERA) 10 MG tablet    Sig: Take 1 tablet (10 mg total) by mouth daily. Take on days when off Vagifem    Dispense:  10 tablet    Refill:  1    Follow-up: Return in about 6 weeks (around 06/26/2016), or if symptoms worsen or fail to improve, for follow up on back pain.   Arnoldo Morale MD

## 2016-05-15 NOTE — Progress Notes (Signed)
C/C: leg pain and right hand pain.

## 2016-05-17 ENCOUNTER — Telehealth: Payer: Self-pay | Admitting: Family Medicine

## 2016-05-17 NOTE — Telephone Encounter (Signed)
Pt. Called stating that she is taking medroxyPROGESTERone (PROVERA) 10 MG tablet and it has her shaking.  Please f/u with pt.

## 2016-05-18 NOTE — Telephone Encounter (Signed)
If she is unable to tolerate day Provera she could stop it but then she will also have to stop the Vagifem which is an unopposed estrogen due to increased risk for cancer.

## 2016-05-22 NOTE — Telephone Encounter (Signed)
Writer called patient back per Dr. Jarold Song and informed her that it was ok with MD for her to stop the provera however she would have to stop vagifem also due to the risk factors with cancer.  Patient stated understanding and stated that she stopped both on 05/17/16.

## 2016-05-24 ENCOUNTER — Telehealth: Payer: Self-pay

## 2016-05-24 ENCOUNTER — Telehealth: Payer: Self-pay | Admitting: Family Medicine

## 2016-05-24 DIAGNOSIS — K0889 Other specified disorders of teeth and supporting structures: Secondary | ICD-10-CM

## 2016-05-24 MED ORDER — CLINDAMYCIN HCL 300 MG PO CAPS: 300.0000 mg | ORAL_CAPSULE | Freq: Two times a day (BID) | ORAL | 0 refills | Status: DC

## 2016-05-24 MED FILL — CLINDAMYCIN HCL 300 MG CAP: 300 | 10 days supply | Qty: 20 | Fill #0

## 2016-05-24 NOTE — Telephone Encounter (Signed)
Antibiotics sent to pharmacy and referral to dentist placed due to complaints of toothache.

## 2016-05-24 NOTE — Telephone Encounter (Signed)
Patient called back and writer was able to let her know that her antibiotic is here att the pharmacy and that MD placed a dental referral.  Patient was grateful.

## 2016-05-24 NOTE — Telephone Encounter (Signed)
Writer tried to call patient back to update her that an antibiotic was sent over to her pharmacy and a dental referral placed.  The gentleman that answered the call stated that he would have patient call back in 5-10 minutes.

## 2016-05-24 NOTE — Telephone Encounter (Signed)
Pt was called and informed of lab results on 9/7.

## 2016-06-11 MED FILL — FLUoxetine HCL 40 MG CAPS: 40 | 30 days supply | Qty: 30 | Fill #3

## 2016-06-11 MED FILL — ADVAIR 500/50 DISKUS: 500-50 | 30 days supply | Qty: 60 | Fill #8

## 2016-06-12 MED FILL — ACETAMINOPHEN/COD #3 TABLET: 300-30 | 30 days supply | Qty: 60 | Fill #1

## 2016-07-05 ENCOUNTER — Other Ambulatory Visit: Payer: Self-pay | Admitting: Family Medicine

## 2016-07-05 DIAGNOSIS — M48062 Spinal stenosis, lumbar region with neurogenic claudication: Secondary | ICD-10-CM

## 2016-07-05 NOTE — Telephone Encounter (Signed)
Patient called the office to request medication refill for acetaminophen-codeine (TYLENOL #3) 300-30 MG tablet. ° °Thank you.  °

## 2016-07-06 MED ORDER — ACETAMINOPHEN-CODEINE #3 300-30 MG PO TABS: 1.0000 | ORAL_TABLET | Freq: Two times a day (BID) | ORAL | 1 refills | Status: DC | PRN

## 2016-07-06 MED FILL — ACETAMINOPHEN/COD #3 TABLET: 300-30 | 30 days supply | Qty: 60 | Fill #0

## 2016-07-06 NOTE — Telephone Encounter (Signed)
I called patient to inform her that her written prescription is ready for pick up.

## 2016-07-06 NOTE — Telephone Encounter (Signed)
Patient notified to come and pick up here tylenol #3.

## 2016-07-06 NOTE — Telephone Encounter (Signed)
Ready for pick up

## 2016-07-17 MED FILL — ADVAIR 500/50 DISKUS: 500-50 | 30 days supply | Qty: 60 | Fill #9

## 2016-07-17 MED FILL — FLUoxetine HCL 40 MG CAPS: 40 | 30 days supply | Qty: 30 | Fill #0

## 2016-07-17 MED FILL — VENTOLIN HFA 90 MCG INHALER: 108 (90 BAS | 30 days supply | Qty: 18 | Fill #2

## 2016-08-02 ENCOUNTER — Emergency Department (HOSPITAL_COMMUNITY)
Admission: EM | Admit: 2016-08-02 | Discharge: 2016-08-02 | Disposition: A | Discharge status: Home / Self Care | Payer: Self-pay | Attending: Emergency Medicine | Admitting: Emergency Medicine

## 2016-08-02 ENCOUNTER — Encounter (HOSPITAL_COMMUNITY): Payer: Self-pay

## 2016-08-02 ENCOUNTER — Other Ambulatory Visit: Payer: Self-pay | Admitting: Family Medicine

## 2016-08-02 DIAGNOSIS — Z79899 Other long term (current) drug therapy: Secondary | ICD-10-CM | POA: Insufficient documentation

## 2016-08-02 DIAGNOSIS — J45909 Unspecified asthma, uncomplicated: Secondary | ICD-10-CM | POA: Insufficient documentation

## 2016-08-02 DIAGNOSIS — Z87891 Personal history of nicotine dependence: Secondary | ICD-10-CM | POA: Insufficient documentation

## 2016-08-02 DIAGNOSIS — F410 Panic disorder [episodic paroxysmal anxiety] without agoraphobia: Secondary | ICD-10-CM | POA: Insufficient documentation

## 2016-08-02 LAB — I-STAT CHEM 8, ED
BUN: 5 mg/dL — ABNORMAL LOW (ref 6–20)
CHLORIDE: 103 mmol/L (ref 101–111)
CREATININE: 0.8 mg/dL (ref 0.44–1.00)
Calcium, Ion: 1.19 mmol/L (ref 1.15–1.40)
GLUCOSE: 89 mg/dL (ref 65–99)
HEMATOCRIT: 37 % (ref 36.0–46.0)
HEMOGLOBIN: 12.6 g/dL (ref 12.0–15.0)
POTASSIUM: 3.4 mmol/L — AB (ref 3.5–5.1)
Sodium: 142 mmol/L (ref 135–145)
TCO2: 25 mmol/L (ref 0–100)

## 2016-08-02 LAB — I-STAT TROPONIN, ED: TROPONIN I, POC: 0 ng/mL (ref 0.00–0.08)

## 2016-08-02 MED ORDER — FLUTICASONE-SALMETEROL 230-21 MCG/ACT IN AERO: 2.0000 | INHALATION_SPRAY | Freq: Two times a day (BID) | RESPIRATORY_TRACT | 2 refills | Status: DC

## 2016-08-02 MED FILL — **ADVAIR HFA 230-21 MCG INH: 230-21 MCG | 30 days supply | Qty: 8 | Fill #0

## 2016-08-02 MED FILL — !VENTOLIN HFA INHALER: 108 (90 BAS | 30 days supply | Qty: 18 | Fill #3

## 2016-08-02 MED FILL — FLUoxetine HCL 40 MG CAPS: 40 | 30 days supply | Qty: 30 | Fill #1

## 2016-08-02 NOTE — ED Notes (Signed)
Pt stable, ambulatory, states understanding of discharge instructions 

## 2016-08-02 NOTE — ED Triage Notes (Signed)
Pt not on any medications for anxiety has recently been starting to have panic attacks

## 2016-08-02 NOTE — ED Provider Notes (Signed)
[facility] DEPT Provider Note   CSN: [id number] Arrival date & time: 08/02/16  1647  By signing my name below, I, [name], attest that this documentation has been prepared under the direction and in the presence of [facility], NP-C. Electronically Signed: [name], ED Scribe. 08/02/16. 5:31 PM.  History   Chief Complaint Chief Complaint  Patient presents with  . Panic Attack    panic attack that started about 30 minutes ago breathing rapid, fingers going numb    The history is provided by the patient. No language interpreter was used.  Anxiety  This is a new problem. The current episode started 1 to 2 hours ago. The problem occurs constantly. The problem has been gradually worsening. Pertinent negatives include no abdominal pain and no headaches. She has tried nothing for the symptoms.  HPI Comments: [name] is a 45 y.o. female with a hx of sickle cell trait and depression brought in by EMS who presents to the Emergency Department complaining of a possible panic attack occurring PTA. Pt says that she woke up out of her sleep and was breathing rapidly and felt like she was gasping for breath. She says that her fingers were going numb, having dry mouth, and was experiencing room-spinning dizziness. She also reports mild blurred vision, mild chest tightness, palpitations, diaphoresis, and right arm pain. She states that her dizziness has now changed to lightheadedness. Pt says that the last time that this happened was 2 years ago, after her father died. She notes that the anniversary of his death is upcoming. She is not currently on medications for anxiety. She denies abdominal pain, nausea, or vomiting.   Patient reports that just prior to arrival EMS personnel were talking to her and she felt calmer and now she feels back almost to normal.   Past Medical History:  Diagnosis Date  . Allergy    pollen, pcn  . Asthma   . Depression   . Fibroids   . History of  ovarian cyst   . Medical history non-contributory    sickle cell  . Menometrorrhagia   . 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 Sickle cell trait [facility])     Patient Active Problem List   Diagnosis Date Noted  . Tooth ache 05/24/2016  . Spinal stenosis, lumbar region, with neurogenic claudication 05/15/2016  . Cervical disc disorder with radiculopathy of cervical region 05/15/2016  . Vaginal atrophy 04/30/2016  . Asthma 02/20/2016  . Depression 02/20/2016  . Myopathy 02/20/2016  . Back pain 02/20/2016  . Neuropathy ([place]) 02/20/2016  . Intermenstrual spotting due to IUD ([place]) 11/12/2011  . Fibroids   . History of ovarian cyst   . Sickle cell trait ([place])   . Menometrorrhagia 04/09/2011  . mirena IUD 02/19/2011    Past Surgical History:  Procedure Laterality Date  . CESAREAN SECTION     all three pregnancies    OB History    Gravida Para Term Preterm AB Living   3 3 0     3   SAB TAB Ectopic Multiple Live Births                   Home Medications    Prior to Admission medications   Medication Sig Start Date End Date Taking? Authorizing Provider  acetaminophen-codeine (TYLENOL #3) 300-30 MG tablet Take 1 tablet by mouth every 12 (twelve) hours as needed for moderate pain. 07/06/16   Arnoldo Morale, MD  albuterol (VENTOLIN HFA) 108 (90 Base) MCG/ACT inhaler Inhale 2 puffs into the lungs every 6 (six) hours as needed for wheezing or shortness of breath. 04/12/16   Arnoldo Morale, MD  clindamycin (CLEOCIN) 300 MG capsule Take 1 capsule (300 mg total) by mouth 2 (two) times daily. 05/24/16   Arnoldo Morale, MD  Estradiol (VAGIFEM) 10 MCG TABS vaginal tablet Place 1 tablet (10 mcg total) vaginally daily. For 2 weeks then twice weekly thereafter 04/30/16   Arnoldo Morale, MD  FLUoxetine (PROZAC) 40 MG capsule Take 1 capsule (40 mg total) by mouth daily. 05/15/16   Arnoldo Morale, MD  Fluticasone-Salmeterol (ADVAIR DISKUS) 500-50 MCG/DOSE AEPB Inhale 1 puff into the lungs 2 (two) times  daily. 05/15/16   Arnoldo Morale, MD  fluticasone-salmeterol (ADVAIR HFA) 230-21 MCG/ACT inhaler Inhale 2 puffs into the lungs 2 (two) times daily. 08/02/16   Arnoldo Morale, MD  gabapentin (NEURONTIN) 300 MG capsule Take 1 capsule (300 mg total) by mouth 2 (two) times daily. 05/15/16   Arnoldo Morale, MD  ipratropium (ATROVENT HFA) 17 MCG/ACT inhaler Inhale 2 puffs into the lungs 2 (two) times daily. 12/14/14   Veryl Speak, MD  lactulose (CHRONULAC) 10 GM/15ML solution Take 15 mLs (10 g total) by mouth 2 (two) times daily as needed for mild constipation. 02/20/16   Arnoldo Morale, MD  levocetirizine (XYZAL) 5 MG tablet Take 1 tablet (5 mg total) by mouth every evening. 06/12/13   Domenic Moras, PA-C  medroxyPROGESTERone (PROVERA) 10 MG tablet Take 1 tablet (10 mg total) by mouth daily. Take on days when off Vagifem 05/15/16   Arnoldo Morale, MD  methocarbamol (ROBAXIN) 500 MG tablet Take 1 tablet (500 mg total) by mouth 2 (two) times daily as needed for muscle spasms. 04/30/16   Arnoldo Morale, MD  traZODone (DESYREL) 50 MG tablet Take 1 tablet (50 mg total) by mouth at bedtime as needed for sleep. 05/15/16   Arnoldo Morale, MD  triamcinolone cream (KENALOG) 0.1 % Apply 1 application topically 2 (two) times daily. 06/01/15   Lance Bosch, NP    Family History Family History  Problem Relation Age of Onset  . Cancer Mother     colon cancer  . Hypertension Mother   . Heart disease Father   . Cancer Father     cancer  . Other Neg Hx     Social History Social History  Substance Use Topics  . Smoking status: Former Smoker    Packs/day: 0.00    Types: Cigarettes    Quit date: 02/14/2016  . Smokeless tobacco: Never Used     Comment: cessation info given  . Alcohol use No     Allergies   Bupropion hcl er (sr); Fruit & vegetable daily [nutritional supplements]; Penicillins; Pollen extract-tree extract; and Other  Review of Systems Review of Systems  Constitutional: Positive for diaphoresis.  HENT:        Dry mouth  Eyes: Positive for visual disturbance.  Respiratory: Positive for chest tightness.   Cardiovascular: Positive for palpitations.  Gastrointestinal: Negative for abdominal pain, nausea and vomiting.  Musculoskeletal: Positive for arthralgias.  Skin: Negative for color change.  Neurological: Positive for dizziness and light-headedness. Negative for syncope, facial asymmetry, speech difficulty, weakness and headaches.  Psychiatric/Behavioral: Negative for confusion. The patient is nervous/anxious.    Physical Exam Updated Vital Signs BP 109/71 (BP Location: Left Arm)   Pulse 65   Temp 98.1 F (36.7 C) (Oral)   Resp 17   SpO2 100%  Physical Exam  Constitutional: She is oriented to person, place, and time. She appears well-developed and well-nourished.  HENT:  Head: Normocephalic and atraumatic.  Mouth/Throat: Oropharynx is clear and moist.  Eyes: Conjunctivae and EOM are normal. Pupils are equal, round, and reactive to light. No scleral icterus.  Normal visual fields  Neck: Normal range of motion. Neck supple.  Cardiovascular: Normal rate and regular rhythm.   Pulmonary/Chest: Effort normal and breath sounds normal.  Musculoskeletal: Normal range of motion.  Grips are equal; radial pulse is 2+; adequate circulation  Neurological: She is alert and oriented to person, place, and time. No cranial nerve deficit or sensory deficit. She displays a negative Romberg sign. Gait normal.  Rapid alternating movements intact  Skin: Skin is warm and dry. Capillary refill takes less than 2 seconds.  Psychiatric: She has a normal mood and affect. Her behavior is normal.  Nursing note and vitals reviewed.  ED Treatments / Results  DIAGNOSTIC STUDIES: Oxygen Saturation is 99% on RA, normal by my interpretation.    COORDINATION OF CARE: 5:31 PM-Discussed treatment plan which includes EKG and labs with pt at bedside and pt agreed to plan.    Labs (all labs ordered are listed, but  only abnormal results are displayed) Labs Reviewed  I-STAT CHEM 8, ED - Abnormal; Notable for the following:       Result Value   Potassium 3.4 (*)    BUN 5 (*)    All other components within normal limits  I-STAT TROPOININ, ED    EKG  EKG Interpretation  Date/Time:  Thursday August 02 2016 17:45:45 EST Ventricular Rate:  63 PR Interval:  144 QRS Duration: 82 QT Interval:  434 QTC Calculation: 444 R Axis:   76 Text Interpretation:  Normal sinus rhythm with sinus arrhythmia Normal ECG Confirmed by RAY MD, Andee Poles [id number]) on 08/03/2016 3:52:15 PM        Radiology No results found.  Procedures Procedures (including critical care time)  Medications Ordered in ED Medications - No data to display   Initial Impression / Assessment and Plan / ED Course  I have reviewed the triage vital signs and the nursing notes.  Pertinent labs & imaging results that were available during my care of the patient were reviewed by me and considered in my medical decision making (see chart for details).  Clinical Course   45 y.o. female with acute anxiety upon awaking from a nap stable for d/c with normal EKG, labs and back to her normal baseline. Discussed with the patient clinical and lab findings and all questioned fully answered. She will f/u with her PCP or return here if any problems arise.  Final Clinical Impressions(s) / ED Diagnoses   Final diagnoses:  Panic attack  I personally performed the services described in this documentation, which was scribed in my presence. The recorded information has been reviewed and is accurate.    New Prescriptions Discharge Medication List as of 08/02/2016  7:48 PM       [place], NP 08/03/16 1646    Leo Grosser, MD 08/06/16 1101

## 2016-08-06 MED FILL — ACETAMINOPHEN/COD #3 TABLET: 300-30 | 30 days supply | Qty: 60 | Fill #1

## 2016-08-15 ENCOUNTER — Encounter: Payer: Self-pay | Admitting: Family Medicine

## 2016-08-15 ENCOUNTER — Ambulatory Visit: Payer: Self-pay | Attending: Family Medicine | Admitting: Family Medicine

## 2016-08-15 ENCOUNTER — Encounter: Payer: Self-pay | Admitting: Licensed Clinical Social Worker

## 2016-08-15 VITALS — BP 104/71 | HR 73 | Temp 98.1°F | Ht 61.0 in | Wt 162.6 lb

## 2016-08-15 DIAGNOSIS — G5601 Carpal tunnel syndrome, right upper limb: Secondary | ICD-10-CM | POA: Insufficient documentation

## 2016-08-15 DIAGNOSIS — N912 Amenorrhea, unspecified: Secondary | ICD-10-CM | POA: Insufficient documentation

## 2016-08-15 DIAGNOSIS — J4540 Moderate persistent asthma, uncomplicated: Secondary | ICD-10-CM | POA: Insufficient documentation

## 2016-08-15 DIAGNOSIS — G4700 Insomnia, unspecified: Secondary | ICD-10-CM | POA: Insufficient documentation

## 2016-08-15 DIAGNOSIS — M5441 Lumbago with sciatica, right side: Secondary | ICD-10-CM | POA: Insufficient documentation

## 2016-08-15 DIAGNOSIS — M48062 Spinal stenosis, lumbar region with neurogenic claudication: Secondary | ICD-10-CM | POA: Insufficient documentation

## 2016-08-15 DIAGNOSIS — Z0001 Encounter for general adult medical examination with abnormal findings: Secondary | ICD-10-CM | POA: Insufficient documentation

## 2016-08-15 DIAGNOSIS — Z880 Allergy status to penicillin: Secondary | ICD-10-CM | POA: Insufficient documentation

## 2016-08-15 DIAGNOSIS — Z79899 Other long term (current) drug therapy: Secondary | ICD-10-CM | POA: Insufficient documentation

## 2016-08-15 DIAGNOSIS — G8929 Other chronic pain: Secondary | ICD-10-CM

## 2016-08-15 DIAGNOSIS — G4709 Other insomnia: Secondary | ICD-10-CM

## 2016-08-15 DIAGNOSIS — F331 Major depressive disorder, recurrent, moderate: Secondary | ICD-10-CM | POA: Insufficient documentation

## 2016-08-15 LAB — POCT URINE PREGNANCY: PREG TEST UR: NEGATIVE

## 2016-08-15 MED ORDER — GABAPENTIN 300 MG PO CAPS: 300.0000 mg | ORAL_CAPSULE | Freq: Two times a day (BID) | ORAL | 3 refills | Status: DC

## 2016-08-15 MED ORDER — TRAZODONE HCL 100 MG PO TABS: 100.0000 mg | ORAL_TABLET | Freq: Every evening | ORAL | 3 refills | Status: DC | PRN

## 2016-08-15 MED ORDER — HYDROXYZINE HCL 25 MG PO TABS: 25.0000 mg | ORAL_TABLET | Freq: Three times a day (TID) | ORAL | 1 refills | Status: DC | PRN

## 2016-08-15 MED ORDER — FLUOXETINE HCL 40 MG PO CAPS: 40.0000 mg | ORAL_CAPSULE | Freq: Every day | ORAL | 3 refills | Status: DC

## 2016-08-15 MED ORDER — ALBUTEROL SULFATE HFA 108 (90 BASE) MCG/ACT IN AERS: 2.0000 | INHALATION_SPRAY | Freq: Four times a day (QID) | RESPIRATORY_TRACT | 3 refills | Status: DC | PRN

## 2016-08-15 MED ORDER — METHOCARBAMOL 500 MG PO TABS: 500.0000 mg | ORAL_TABLET | Freq: Two times a day (BID) | ORAL | 2 refills | Status: DC | PRN

## 2016-08-15 MED ORDER — ACETAMINOPHEN-CODEINE #3 300-30 MG PO TABS: 1.0000 | ORAL_TABLET | Freq: Two times a day (BID) | ORAL | 2 refills | Status: DC | PRN

## 2016-08-15 MED FILL — hydrOXYzine HCL 25 MG TABS: 25 | 30 days supply | Qty: 90 | Fill #0

## 2016-08-15 NOTE — BH Specialist Note (Signed)
Session Start time: 4:05 pm   End Time: 4:35 pm Total Time:  30 minutes Type of Service: [place] Interpreter: No.   Interpreter Name & Language: N/A # [facility] Visits July 2017-June 2018: 1st   SUBJECTIVE: [name] is a 45 y.o. female  Pt. was referred by Dr. Jarold Song for:  anxiety and depression. Pt. reports the following symptoms/concerns: overwhelming feelings of sadness, irritability, withdrawn behavior, difficulty sleeping due to nightmares, and panic attacks Duration of problem:  Pt reports being diagnosed with depression in 2006 and anxiety in 2017.  Severity: severe Previous treatment: None reported   OBJECTIVE: Mood: Anxious & Affect: Appropriate Risk of harm to self or others: Pt reports frequent thoughts of hurting people. Pt stated that the people in her thoughts are strangers. Pt denied intent or plan to harm self or others Assessments administered: PHQ-9; GAD-7  LIFE CONTEXT:  Family & Social: Pt's support system consists of fiancee and adult daughter who visits pt everyday. Pt has attended a women wellness group at [facility] every Wednesday for the last four years School/ Work: Pt has applied for disability (pending hearing date) Self-Care: Pt has difficulty sleeping. Pt has began to smoke cigarettes (3 a day) due to stress after being tobacco free for one year.   Life changes: Pt reports chronic pain and is grieving the loss of her father. The third year anniversary of his death was in 08/02/23 and his birthday is upcoming in December  What is important to pt/family (values): Family   GOALS ADDRESSED:  Decrease symptoms of anxiety Decrease symptoms of depression  INTERVENTIONS: Solution Focused, Strength-based, Supportive and Meditation: Mindfulness   ASSESSMENT:  Pt currently experiencing depression and anxiety triggered by chronic pain and grieving the loss of parent. Pt reports overwhelming feelings of sadness,  irritability, withdrawn behavior, difficulty sleeping due to nightmares, and panic attacks. Pt may benefit from psycho education, psychotherapy, and medication management. [place] educated pt on the stages of grief and encouraged her to participate in grief counseling and support. LCSWA discussed benefits of applying healthy coping skills to decrease symptoms of depression and anxiety. Pt was provided community resources for tobacco cessation, crisis intervention, therapy, and medication management.     PLAN: 1. F/U with behavioral health clinician: Pt was encouraged to contact [place] if symptoms worsen or fail to improve to schedule behavioral appointments at [facility] 2. Behavioral Health meds: Hydroxyzine, Trazodone, and Prozac 3. Behavioral recommendations: LCSWA recommends that pt apply healthy coping skills discussed. Pt is encouraged to schedule follow up appointment with LCSWA 4. Referral: Brief Counseling/Psychotherapy, [employer], Problem-solving teaching/coping strategies, Psychoeducation and Supportive Counseling 5. From scale of 1-10, how likely are you to follow plan: [place], MSW, [place] Worker 08/16/16 5:40 pm  Warmhandoff:   Warm Hand Off Completed.

## 2016-08-15 NOTE — Patient Instructions (Signed)
Carpal Tunnel Release Carpal tunnel release is a surgical procedure to relieve numbness and pain in your hand that are caused by carpal tunnel syndrome. Your carpal tunnel is a narrow, hollow space in your wrist. It passes between your wrist bones and a band of connective tissue (transverse carpal ligament). The nerve that supplies most of your hand (median nerve) passes through this space, and so do the connections between your fingers and the muscles of your arm (tendons). Carpal tunnel syndrome makes this space swell and become narrow, and this causes pain and numbness. In carpal tunnel release surgery, a surgeon cuts through the transverse carpal ligament to make more room in the carpal tunnel space. You may have this surgery if other types of treatment have not worked. Tell a health care provider about:  Any allergies you have.  All medicines you are taking, including vitamins, herbs, eye drops, creams, and over-the-counter medicines.  Any problems you or family members have had with anesthetic medicines.  Any blood disorders you have.  Any surgeries you have had.  Any medical conditions you have. What are the risks? Generally, this is a safe procedure. However, problems may occur, including:  Bleeding.  Infection.  Injury to the median nerve.  Need for additional surgery.  What happens before the procedure?  Ask your health care provider about: ? Changing or stopping your regular medicines. This is especially important if you are taking diabetes medicines or blood thinners. ? Taking medicines such as aspirin and ibuprofen. These medicines can thin your blood. Do not take these medicines before your procedure if your health care provider instructs you not to.  Do not eat or drink anything after midnight on the night before the procedure or as directed by your health care provider.  Plan to have someone take you home after the procedure. What happens during the  procedure?  An IV tube may be inserted into a vein.  You will be given one of the following: ? A medicine that numbs the wrist area (local anesthetic). You may also be given a medicine to make you relax (sedative). ? A medicine that makes you go to sleep (general anesthetic).  Your arm, hand, and wrist will be cleaned with a germ-killing solution (antiseptic).  Your surgeon will make a surgical cut (incision) over the palm side of your wrist. The surgeon will pull aside the skin of your wrist to expose the carpal tunnel space.  The surgeon will cut the transverse carpal ligament.  The edges of the incision will be closed with stitches (sutures) or staples.  A bandage (dressing) will be placed over your wrist and wrapped around your hand and wrist. What happens after the procedure?  You may spend some time in a recovery area.  Your blood pressure, heart rate, breathing rate, and blood oxygen level will be monitored often until the medicines you were given have worn off.  You will likely have some pain. You will be given pain medicine.  You may need to wear a splint or a wrist brace over your dressing. This information is not intended to replace advice given to you by your health care provider. Make sure you discuss any questions you have with your health care provider. Document Released: 11/24/2003 Document Revised: 02/09/2016 Document Reviewed: 04/21/2014 Elsevier Interactive Patient Education  2017 [employer]  

## 2016-08-16 NOTE — Progress Notes (Signed)
Subjective:  Patient ID: [name], female    DOB: 02/18/1971  Age: 45 y.o. MRN: [id number]  CC: Late Period; Osteoarthritis; Medication Refill; Arm Pain (right arm); Knee Pain (bilateral); painic attacks; Headache; and Dizziness   HPI [name] is a 45 year old female with a history of asthma, hypertension, depression, chronic low back pain secondary to spinal stenosis, degenerative disc disease who comes in for a follow-up visit.  She was recently seen at the ED for panic attacks. She informs me that the anniversary of her dad's passing is fast approaching and she is having increased heart rate, rapid breathing and difficulty sleeping at night. She is currently on fluoxetine for depression and trazodone for insomnia which do not help her symptoms. These are accompanied by headaches and dizziness.  Also complains of right hand pain; she is right-handed and ambulates with a cane. Pain causing right wrist and right forearm and she sometimes has numbness in her hands.  She informs me that she has had a late menstrual period.  Tylenol 3 controls her Chronic back pain along with her appendix and; she is beginning to experience knee pains as well which are worse with going up and down stairs.   Past Medical History:  Diagnosis Date  . Allergy    pollen, pcn  . Asthma   . Depression   . Fibroids   . History of ovarian cyst   . Medical history non-contributory    sickle cell  . Menometrorrhagia   . 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 Sickle cell trait [facility])     Past Surgical History:  Procedure Laterality Date  . CESAREAN SECTION     all three pregnancies    Allergies  Allergen Reactions  . Bupropion Hcl Er (Sr) Hives and Other (See Comments)    The patient has an entire page of reactions that can be caused by the medication, but none that she has suffered.  . Fruit & Vegetable Daily [Nutritional Supplements] Other (See Comments)    "all fruits causes  bumps and weird feeling in her mouth"  . Penicillins Hives  . Pollen Extract-Tree Extract Other (See Comments)    Allergies  . Other Rash    "bugs" roaches    Outpatient Medications Prior to Visit  Medication Sig Dispense Refill  . Fluticasone-Salmeterol (ADVAIR DISKUS) 500-50 MCG/DOSE AEPB Inhale 1 puff into the lungs 2 (two) times daily. 180 each 3  . fluticasone-salmeterol (ADVAIR HFA) 230-21 MCG/ACT inhaler Inhale 2 puffs into the lungs 2 (two) times daily. 1 Inhaler 2  . ipratropium (ATROVENT HFA) 17 MCG/ACT inhaler Inhale 2 puffs into the lungs 2 (two) times daily. 1 Inhaler 12  . lactulose (CHRONULAC) 10 GM/15ML solution Take 15 mLs (10 g total) by mouth 2 (two) times daily as needed for mild constipation. 946 mL 2  . levocetirizine (XYZAL) 5 MG tablet Take 1 tablet (5 mg total) by mouth every evening. 30 tablet 0  . triamcinolone cream (KENALOG) 0.1 % Apply 1 application topically 2 (two) times daily. 30 g 0  . acetaminophen-codeine (TYLENOL #3) 300-30 MG tablet Take 1 tablet by mouth every 12 (twelve) hours as needed for moderate pain. 60 tablet 1  . albuterol (VENTOLIN HFA) 108 (90 Base) MCG/ACT inhaler Inhale 2 puffs into the lungs every 6 (six) hours as needed for wheezing or shortness of breath. 18 g 3  . clindamycin (CLEOCIN) 300 MG capsule Take 1 capsule (300 mg total)  by mouth 2 (two) times daily. 20 capsule 0  . Estradiol (VAGIFEM) 10 MCG TABS vaginal tablet Place 1 tablet (10 mcg total) vaginally daily. For 2 weeks then twice weekly thereafter 30 tablet 1  . FLUoxetine (PROZAC) 40 MG capsule Take 1 capsule (40 mg total) by mouth daily. 30 capsule 3  . gabapentin (NEURONTIN) 300 MG capsule Take 1 capsule (300 mg total) by mouth 2 (two) times daily. 60 capsule 3  . medroxyPROGESTERone (PROVERA) 10 MG tablet Take 1 tablet (10 mg total) by mouth daily. Take on days when off Vagifem 10 tablet 1  . methocarbamol (ROBAXIN) 500 MG tablet Take 1 tablet (500 mg total) by mouth 2 (two)  times daily as needed for muscle spasms. 60 tablet 2  . traZODone (DESYREL) 50 MG tablet Take 1 tablet (50 mg total) by mouth at bedtime as needed for sleep. 30 tablet 3   No facility-administered medications prior to visit.     ROS Review of Systems Constitutional: Negative for activity change, appetite change and fatigue.  HENT: Negative for congestion, sinus pressure and sore throat.   Eyes: Negative for visual disturbance.  Respiratory: Negative for cough, chest tightness, shortness of breath and wheezing.   Cardiovascular: Negative for chest pain and palpitations.  Gastrointestinal: Negative for abdominal distention, abdominal pain and constipation.  Endocrine: Negative for polydipsia.  Genitourinary: Negative for dysuria and frequency.  Musculoskeletal:       See hpi  Skin: Negative for rash.  Neurological: Positive for weakness and numbness. Negative for tremors and positive for light-headedness.  Hematological: Does not bruise/bleed easily.  Psychiatric/Behavioral: Positive for anxiety and panic attacks. Negative for suicidal ideation  Objective:  BP 104/71 (BP Location: Right Arm, Patient Position: Sitting, Cuff Size: Small)   Pulse 73   Temp 98.1 F (36.7 C) (Oral)   Ht 5\' 1"  (1.549 m)   Wt 162 lb 9.6 oz (73.8 kg)   LMP 03/28/2016   SpO2 100%   BMI 30.72 kg/m   BP/Weight 08/15/2016 08/02/2016 [id number]  Systolic BP 123456 [id number]  Diastolic BP 71 71 73  Wt. (Lbs) 162.6 - 162.6  BMI 30.72 - 30.72  Some encounter information is confidential and restricted. Go to Review Flowsheets activity to see all data.      Physical Exam Constitutional: She is oriented to person, place, and time. She appears well-developed and well-nourished.  Cardiovascular: Normal rate, normal heart sounds and intact distal pulses.   No murmur heard. Pulmonary/Chest: Effort normal and breath sounds normal. She has no wheezes. She has no rales. She exhibits no tenderness.  Abdominal: Soft.  Bowel sounds are normal. She exhibits no distension and no mass. There is no tenderness.  Musculoskeletal: She exhibits tenderness to palpation of lumbar spine. Positive straight leg raise bilaterally. Positive Phalen's and Tinel's sign on the right wrist  Neurological: She is alert and oriented to person, place, and time. Psych: Normal mood and affect.  Assessment & Plan:   1. Amenorrhea Likely onset of menopause - POCT urine pregnancy  2. Moderate episode of recurrent major depressive disorder (HCC) Current anxiety and panic attacks triggered by anniversary of her dad's passing Will add hydroxyzine to regimen LCSW called in for therapy - traZODone (DESYREL) 100 MG tablet; Take 1 tablet (100 mg total) by mouth at bedtime as needed for sleep.  Dispense: 30 tablet; Refill: 3 - FLUoxetine (PROZAC) 40 MG capsule; Take 1 capsule (40 mg total) by mouth daily.  Dispense: 30 capsule; Refill: 3  3. Spinal stenosis, lumbar region, with neurogenic claudication Doing well on Tylenol 3 - gabapentin (NEURONTIN) 300 MG capsule; Take 1 capsule (300 mg total) by mouth 2 (two) times daily.  Dispense: 60 capsule; Refill: 3 - acetaminophen-codeine (TYLENOL #3) 300-30 MG tablet; Take 1 tablet by mouth every 12 (twelve) hours as needed for moderate pain.  Dispense: 60 tablet; Refill: 2  4. Chronic midline low back pain with right-sided sciatica - methocarbamol (ROBAXIN) 500 MG tablet; Take 1 tablet (500 mg total) by mouth 2 (two) times daily as needed for muscle spasms.  Dispense: 60 tablet; Refill: 2  5. Other insomnia Increased dose of trazodone due to insomnia associated with panic attacks  6. Carpal tunnel syndrome of right wrist Unexplained right arm pain due to the fact that she ambulates with the aid of a cane and she is right-handed Advised to use wrist brace  7. Moderate persistent asthma without complication Stable - albuterol (VENTOLIN HFA) 108 (90 Base) MCG/ACT inhaler; Inhale 2 puffs into  the lungs every 6 (six) hours as needed for wheezing or shortness of breath.  Dispense: 18 g; Refill: 3   Meds ordered this encounter  Medications  . traZODone (DESYREL) 100 MG tablet    Sig: Take 1 tablet (100 mg total) by mouth at bedtime as needed for sleep.    Dispense:  30 tablet    Refill:  3  . albuterol (VENTOLIN HFA) 108 (90 Base) MCG/ACT inhaler    Sig: Inhale 2 puffs into the lungs every 6 (six) hours as needed for wheezing or shortness of breath.    Dispense:  18 g    Refill:  3  . FLUoxetine (PROZAC) 40 MG capsule    Sig: Take 1 capsule (40 mg total) by mouth daily.    Dispense:  30 capsule    Refill:  3  . gabapentin (NEURONTIN) 300 MG capsule    Sig: Take 1 capsule (300 mg total) by mouth 2 (two) times daily.    Dispense:  60 capsule    Refill:  3  . methocarbamol (ROBAXIN) 500 MG tablet    Sig: Take 1 tablet (500 mg total) by mouth 2 (two) times daily as needed for muscle spasms.    Dispense:  60 tablet    Refill:  2  . acetaminophen-codeine (TYLENOL #3) 300-30 MG tablet    Sig: Take 1 tablet by mouth every 12 (twelve) hours as needed for moderate pain.    Dispense:  60 tablet    Refill:  2    Do not refill prior to 08/30/16  . hydrOXYzine (ATARAX/VISTARIL) 25 MG tablet    Sig: Take 1 tablet (25 mg total) by mouth 3 (three) times daily as needed.    Dispense:  90 tablet    Refill:  1    Follow-up: Return in about 3 months (around 11/14/2016) for Follow-up on chronic medical conditions.   Arnoldo Morale MD

## 2016-08-20 ENCOUNTER — Other Ambulatory Visit: Payer: Self-pay | Admitting: *Deleted

## 2016-08-20 DIAGNOSIS — J4540 Moderate persistent asthma, uncomplicated: Secondary | ICD-10-CM

## 2016-08-20 MED ORDER — ALBUTEROL SULFATE HFA 108 (90 BASE) MCG/ACT IN AERS: 2.0000 | INHALATION_SPRAY | Freq: Four times a day (QID) | RESPIRATORY_TRACT | 3 refills | Status: DC | PRN

## 2016-08-20 MED ORDER — FLUTICASONE-SALMETEROL 500-50 MCG/DOSE IN AEPB: 1.0000 | INHALATION_SPRAY | Freq: Two times a day (BID) | RESPIRATORY_TRACT | 3 refills | Status: DC

## 2016-08-20 NOTE — Telephone Encounter (Signed)
PRINTED FOR PASS PROGRAM 

## 2016-08-27 MED FILL — GABAPENTIN 300 MG CAPSULE: 300 | 30 days supply | Qty: 60 | Fill #0

## 2016-08-27 MED FILL — !VENTOLIN HFA INHALER: 108 (90 BAS | 25 days supply | Qty: 18 | Fill #0

## 2016-08-27 MED FILL — METHOCARBAMOL 500 MG TABLET: 500 | 30 days supply | Qty: 60 | Fill #0

## 2016-08-27 MED FILL — **ADVAIR HFA 230-21 MCG INH: 230-21 MCG | 30 days supply | Qty: 8 | Fill #1

## 2016-08-27 MED FILL — traZODone HCL 100 MG TABS: 100 | 30 days supply | Qty: 30 | Fill #0

## 2016-09-03 MED FILL — ACETAMINOPHEN/COD #3 TABLET: 300-30 | 30 days supply | Qty: 60 | Fill #0

## 2016-09-06 ENCOUNTER — Other Ambulatory Visit: Payer: Self-pay | Admitting: *Deleted

## 2016-09-06 DIAGNOSIS — J4540 Moderate persistent asthma, uncomplicated: Secondary | ICD-10-CM

## 2016-09-06 MED ORDER — ALBUTEROL SULFATE HFA 108 (90 BASE) MCG/ACT IN AERS: 2.0000 | INHALATION_SPRAY | Freq: Four times a day (QID) | RESPIRATORY_TRACT | 3 refills | Status: DC | PRN

## 2016-09-06 MED ORDER — FLUTICASONE-SALMETEROL 500-50 MCG/DOSE IN AEPB: 1.0000 | INHALATION_SPRAY | Freq: Two times a day (BID) | RESPIRATORY_TRACT | 3 refills | Status: DC

## 2016-09-06 NOTE — Telephone Encounter (Signed)
PRINTED FOR PASS PROGRAM 

## 2016-09-28 MED FILL — **ADVAIR HFA 230-21 MCG INH: 230-21 MCG | 30 days supply | Qty: 8 | Fill #2

## 2016-09-28 MED FILL — ACETAMINOPHEN/COD #3 TABLET: 300-30 | 30 days supply | Qty: 60 | Fill #1

## 2016-09-28 MED FILL — !VENTOLIN HFA INHALER: 108 (90 BAS | 25 days supply | Qty: 18 | Fill #1

## 2016-09-28 MED FILL — hydrOXYzine HCL 25 MG TABS: 25 | 30 days supply | Qty: 90 | Fill #1

## 2016-10-09 ENCOUNTER — Encounter: Payer: Self-pay | Admitting: Family Medicine

## 2016-10-09 ENCOUNTER — Encounter (INDEPENDENT_AMBULATORY_CARE_PROVIDER_SITE_OTHER): Payer: Self-pay

## 2016-10-09 ENCOUNTER — Ambulatory Visit: Payer: Self-pay | Attending: Family Medicine | Admitting: Family Medicine

## 2016-10-09 ENCOUNTER — Other Ambulatory Visit: Payer: Self-pay | Admitting: Family Medicine

## 2016-10-09 VITALS — BP 124/81 | HR 88 | Temp 98.0°F | Ht 61.0 in | Wt 161.4 lb

## 2016-10-09 DIAGNOSIS — F329 Major depressive disorder, single episode, unspecified: Secondary | ICD-10-CM | POA: Insufficient documentation

## 2016-10-09 DIAGNOSIS — Z880 Allergy status to penicillin: Secondary | ICD-10-CM | POA: Insufficient documentation

## 2016-10-09 DIAGNOSIS — Z888 Allergy status to other drugs, medicaments and biological substances status: Secondary | ICD-10-CM | POA: Insufficient documentation

## 2016-10-09 DIAGNOSIS — F419 Anxiety disorder, unspecified: Secondary | ICD-10-CM | POA: Insufficient documentation

## 2016-10-09 DIAGNOSIS — M48062 Spinal stenosis, lumbar region with neurogenic claudication: Secondary | ICD-10-CM

## 2016-10-09 DIAGNOSIS — Z91038 Other insect allergy status: Secondary | ICD-10-CM | POA: Insufficient documentation

## 2016-10-09 DIAGNOSIS — L308 Other specified dermatitis: Secondary | ICD-10-CM

## 2016-10-09 DIAGNOSIS — G8929 Other chronic pain: Secondary | ICD-10-CM | POA: Insufficient documentation

## 2016-10-09 DIAGNOSIS — E876 Hypokalemia: Secondary | ICD-10-CM

## 2016-10-09 DIAGNOSIS — D573 Sickle-cell trait: Secondary | ICD-10-CM | POA: Insufficient documentation

## 2016-10-09 DIAGNOSIS — J45909 Unspecified asthma, uncomplicated: Secondary | ICD-10-CM | POA: Insufficient documentation

## 2016-10-09 DIAGNOSIS — I10 Essential (primary) hypertension: Secondary | ICD-10-CM | POA: Insufficient documentation

## 2016-10-09 DIAGNOSIS — L309 Dermatitis, unspecified: Secondary | ICD-10-CM | POA: Insufficient documentation

## 2016-10-09 DIAGNOSIS — M5010 Cervical disc disorder with radiculopathy, unspecified cervical region: Secondary | ICD-10-CM

## 2016-10-09 DIAGNOSIS — G4700 Insomnia, unspecified: Secondary | ICD-10-CM | POA: Insufficient documentation

## 2016-10-09 DIAGNOSIS — M4722 Other spondylosis with radiculopathy, cervical region: Secondary | ICD-10-CM | POA: Insufficient documentation

## 2016-10-09 LAB — BASIC METABOLIC PANEL
BUN: 7 mg/dL (ref 7–25)
CHLORIDE: 106 mmol/L (ref 98–110)
CO2: 25 mmol/L (ref 20–31)
Calcium: 8.8 mg/dL (ref 8.6–10.2)
Creat: 0.67 mg/dL (ref 0.50–1.10)
GLUCOSE: 74 mg/dL (ref 65–99)
Potassium: 4 mmol/L (ref 3.5–5.3)
SODIUM: 140 mmol/L (ref 135–146)

## 2016-10-09 MED ORDER — TRIAMCINOLONE ACETONIDE 0.1 % EX CREA: 1.0000 "application " | TOPICAL_CREAM | Freq: Two times a day (BID) | CUTANEOUS | 1 refills | Status: DC

## 2016-10-09 MED ORDER — MELOXICAM 7.5 MG PO TABS: 7.5000 mg | ORAL_TABLET | Freq: Every day | ORAL | 1 refills | Status: DC

## 2016-10-09 MED FILL — TRIAMCINOLONE 0.1% CREAM: 0.1 | 15 days supply | Qty: 30 | Fill #0

## 2016-10-09 MED FILL — ?MELOXICAM 7.5 MG TABLET: 7.5 | 30 days supply | Qty: 30 | Fill #0

## 2016-10-09 NOTE — Progress Notes (Signed)
Subjective:  Patient ID: [name], female    DOB: Aug 29, 1971  Age: 46 y.o. MRN: [id number]  CC: Osteoarthritis (follow up); Fall (right arm); Back Pain; and Numbness (feet R>L)   HPI [name] is a 46 year old female with a history of asthma, hypertension, depression, chronic low back pain secondary to spinal stenosis, degenerative disc disease who comes in for a follow-up visit.  She remains on Tylenol 3 for chronic back pain but informs me she spilled her medication which she recently picked up and currently has only 2 tablets. She is currently under a pain contract. Pain starts in bilateral LE and radiates up to her thighs and up to the lower back and she has noticed progressive difficulty with ambulation to the point where she has to use a cane and her legs give out on her sometimes making her fall.  She recently fell on her right arm.  MRI of cervical, thoracic and lumbar spine revealed canal stenosis at C4-5 and C5-6, and neural foraminal narrowing which is moderate to severe on the right at C5-6, degenerative changes and thoracic spine, L3-L4 and L4-L5 neural foraminal narrowing. She has been unable to see a spine specialist due to lack of medical coverage.  She was placed on hydroxyzine for anxiety but reports fighting in her sleep when she takes it at night. She recently got past the anniversary of her dad's passing and out of her mom comes up later this month. She would still like to take it during the day but would try to avoid it at night. Takes trazodone for insomnia. Needs a refill on triamcinolone cream which she uses for eczema   Past Medical History:  Diagnosis Date  . Allergy    pollen, pcn  . Asthma   . Depression   . Fibroids   . History of ovarian cyst   . Medical history non-contributory    sickle cell  . Menometrorrhagia   . 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 Sickle cell trait [facility])     Past Surgical History:  Procedure Laterality  Date  . CESAREAN SECTION     all three pregnancies    Allergies  Allergen Reactions  . Bupropion Hcl Er (Sr) Hives and Other (See Comments)    The patient has an entire page of reactions that can be caused by the medication, but none that she has suffered.  . Fruit & Vegetable Daily [Nutritional Supplements] Other (See Comments)    "all fruits causes bumps and weird feeling in her mouth"  . Penicillins Hives  . Pollen Extract-Tree Extract Other (See Comments)    Allergies  . Other Rash    "bugs" roaches     Outpatient Medications Prior to Visit  Medication Sig Dispense Refill  . acetaminophen-codeine (TYLENOL #3) 300-30 MG tablet Take 1 tablet by mouth every 12 (twelve) hours as needed for moderate pain. 60 tablet 2  . albuterol (VENTOLIN HFA) 108 (90 Base) MCG/ACT inhaler Inhale 2 puffs into the lungs every 6 (six) hours as needed for wheezing or shortness of breath. 54 g 3  . FLUoxetine (PROZAC) 40 MG capsule Take 1 capsule (40 mg total) by mouth daily. 30 capsule 3  . Fluticasone-Salmeterol (ADVAIR DISKUS) 500-50 MCG/DOSE AEPB Inhale 1 puff into the lungs 2 (two) times daily. 180 each 3  . fluticasone-salmeterol (ADVAIR HFA) 230-21 MCG/ACT inhaler Inhale 2 puffs into the lungs 2 (two) times daily. 1 Inhaler 2  .  gabapentin (NEURONTIN) 300 MG capsule Take 1 capsule (300 mg total) by mouth 2 (two) times daily. 60 capsule 3  . hydrOXYzine (ATARAX/VISTARIL) 25 MG tablet Take 1 tablet (25 mg total) by mouth 3 (three) times daily as needed. 90 tablet 1  . ipratropium (ATROVENT HFA) 17 MCG/ACT inhaler Inhale 2 puffs into the lungs 2 (two) times daily. 1 Inhaler 12  . lactulose (CHRONULAC) 10 GM/15ML solution Take 15 mLs (10 g total) by mouth 2 (two) times daily as needed for mild constipation. 946 mL 2  . levocetirizine (XYZAL) 5 MG tablet Take 1 tablet (5 mg total) by mouth every evening. 30 tablet 0  . methocarbamol (ROBAXIN) 500 MG tablet Take 1 tablet (500 mg total) by mouth 2 (two)  times daily as needed for muscle spasms. 60 tablet 2  . traZODone (DESYREL) 100 MG tablet Take 1 tablet (100 mg total) by mouth at bedtime as needed for sleep. 30 tablet 3  . triamcinolone cream (KENALOG) 0.1 % Apply 1 application topically 2 (two) times daily. 30 g 0   No facility-administered medications prior to visit.     ROS Review of Systems Constitutional: Negative for activity change, appetite change and fatigue.  HENT: Negative for congestion, sinus pressure and sore throat.   Eyes: Negative for visual disturbance.  Respiratory: Negative for cough, chest tightness, shortness of breath and wheezing.   Cardiovascular: Negative for chest pain and palpitations.  Gastrointestinal: Negative for abdominal distention, abdominal pain and constipation.  Endocrine: Negative for polydipsia.  Genitourinary: Negative for dysuria and frequency.  Musculoskeletal:       See hpi  Skin: Negative for rash.  Neurological: Positive for weakness and numbness. Negative for tremors and positive for light-headedness.  Hematological: Does not bruise/bleed easily.  Psychiatric/Behavioral: Positive for anxiety and panic attacks. Negative for suicidal ideation  Objective:  BP 124/81 (BP Location: Right Arm, Patient Position: Sitting, Cuff Size: Small)   Pulse 88   Temp 98 F (36.7 C) (Oral)   Ht 5\' 1"  (1.549 m)   Wt 161 lb 6.4 oz (73.2 kg)   SpO2 98%   BMI 30.50 kg/m   BP/Weight 10/09/2016 08/15/2016 [id number]  Systolic BP [id number]  Diastolic BP 81 71 71  Wt. (Lbs) 161.4 162.6 -  BMI 30.5 30.72 -  Some encounter information is confidential and restricted. Go to Review Flowsheets activity to see all data.      Physical Exam Constitutional: She is oriented to person, place, and time. She appears well-developed and well-nourished.  Cardiovascular: Normal rate, normal heart sounds and intact distal pulses.   No murmur heard. Pulmonary/Chest: Effort normal and breath sounds normal. She has  no wheezes. She has no rales. She exhibits no tenderness.  Abdominal: Soft. Bowel sounds are normal. She exhibits no distension and no mass. There is no tenderness.  Musculoskeletal: She exhibits tenderness to palpation of lumbar spine. Positive straight leg raise bilaterally. Normal deep tendon reflexes bilaterally  Neurological: She is alert and oriented to person, place, and time.Normal knee jerk reflexes bilaterally  Psych: Normal mood and affect.  Assessment & Plan:   1. Other eczema - triamcinolone cream (KENALOG) 0.1 %; Apply 1 application topically 2 (two) times daily.  Dispense: 30 g; Refill: 1  2. Cervical disc disorder with radiculopathy of cervical region Reviewed [place] controlled substances database and no suspicious activity noted Patient advised that she would only receive refill of Tylenol 3 as scheduled - we'll write atnext office visit in 1 month.  3. Spinal stenosis, lumbar region, with neurogenic claudication Unable to sit spinal surgery due to lack of medical coverage Continue Robaxin and gabapentin - meloxicam (MOBIC) 7.5 MG tablet; Take 1 tablet (7.5 mg total) by mouth daily.  Dispense: 30 tablet; Refill: 1 - Drug Screen, Urine  4. Hypokalemia - Basic Metabolic Panel   Meds ordered this encounter  Medications  . triamcinolone cream (KENALOG) 0.1 %    Sig: Apply 1 application topically 2 (two) times daily.    Dispense:  30 g    Refill:  1  . meloxicam (MOBIC) 7.5 MG tablet    Sig: Take 1 tablet (7.5 mg total) by mouth daily.    Dispense:  30 tablet    Refill:  1    Follow-up: Return in about 1 month (around 11/09/2016) for Follow-up in spinal stenosis with chronic medical conditions.   Arnoldo Morale MD

## 2016-10-09 NOTE — Progress Notes (Signed)
Spilled tylenol #3 in water yesterday- only has 2 left. Needs refill on triamcinolone cream

## 2016-10-14 LAB — DRUG ABUSE PANEL 10-50, U
AMPHETAMINES (1000 NG/ML SCRN): NEGATIVE
BARBITURATES: NEGATIVE
BENZODIAZEPINES: NEGATIVE
COCAINE METABOLITES: NEGATIVE
MARIJUANA MET (50 NG/ML SCRN): NEGATIVE
METHADONE: NEGATIVE
METHAQUALONE: NEGATIVE
OPIATES: NEGATIVE
PHENCYCLIDINE: NEGATIVE
PROPOXYPHENE: NEGATIVE

## 2016-10-15 ENCOUNTER — Telehealth: Payer: Self-pay | Admitting: Family Medicine

## 2016-10-15 NOTE — Telephone Encounter (Signed)
She gave me that information at her visit on 10/09/16 and I had discussed the plan with her.

## 2016-10-15 NOTE — Telephone Encounter (Signed)
Writer called patient back regarding the tylenol #3 refill.  It was discussed that patient would not be able to get that medication refill until the end of February as she and MD discussed at her last visit.  Patient asked what would happen if she  needed more of the mobic.  Writer stated that she received #30 on the 23rd of January and she had a refill which should last her until the end of March. Patient states she on has 15 pills left and writer asked her how many she was taking daily because in 6 days she took half a bottle of the pills.  Patient admits to taking 2-3 daily because she is in a lot of pain. Writer encouraged her to make her follow up appt in February.

## 2016-10-15 NOTE — Telephone Encounter (Signed)
Pt called states she dropped all of her medications in tub and has no medication, states she talked to pharmacy and they can not fill medications early. acetaminophen-codeine (TYLENOL #3) 300-30 MG tablet pt would like to know what she need to do.

## 2016-10-16 ENCOUNTER — Telehealth: Payer: Self-pay

## 2016-10-16 NOTE — Telephone Encounter (Signed)
-----   Message from Arnoldo Morale, MD sent at 10/15/2016  8:38 AM EST ----- Her urine drug screen was clean; Tylenol #3 which she is supposed to be taking was absent from her urine. She will no longer be receiving opiates from me.

## 2016-10-16 NOTE — Telephone Encounter (Signed)
Writer called patient today to discuss lab findings- her urine drug screen was discussed and patient was informed per MD that she no longer will prescribe opiates.  Patient stated understanding.

## 2016-11-02 ENCOUNTER — Other Ambulatory Visit: Payer: Self-pay | Admitting: Family Medicine

## 2016-11-02 MED FILL — ACETAMINOPHEN/COD #3 TABLET: 300-30 | 30 days supply | Qty: 60 | Fill #2

## 2016-11-02 MED FILL — FLUoxetine HCL 40 MG CAPS: 40 | 30 days supply | Qty: 30 | Fill #0

## 2016-11-02 MED FILL — hydrOXYzine HCL 25 MG TABS: 25 | 30 days supply | Qty: 90 | Fill #0

## 2016-11-02 MED FILL — $VENTOLIN HFA 18G INHALER: 108 (90 BAS | 25 days supply | Qty: 18 | Fill #2

## 2016-11-02 MED FILL — $ADVAIR 500/50MCG INHALER: 500-50 | 30 days supply | Qty: 60 | Fill #0

## 2016-11-02 MED FILL — GABAPENTIN 300 MG CAPSULE: 300 | 30 days supply | Qty: 60 | Fill #1

## 2016-11-02 MED FILL — NAPROXEN 500 MG TABLET: 500 | 30 days supply | Qty: 60 | Fill #2

## 2016-11-02 MED FILL — METHOCARBAMOL 500 MG TABLET: 500 | 30 days supply | Qty: 60 | Fill #1

## 2016-11-30 MED FILL — $VENTOLIN HFA 18G INHALER: 108 (90 BAS | 25 days supply | Qty: 18 | Fill #3

## 2016-11-30 MED FILL — $ADVAIR 500/50MCG INHALER: 500-50 | 30 days supply | Qty: 60 | Fill #1

## 2016-11-30 MED FILL — FLUoxetine HCL 40 MG CAPS: 40 | 30 days supply | Qty: 30 | Fill #1

## 2016-11-30 MED FILL — NAPROXEN 500 MG TABLET: 500 | 30 days supply | Qty: 60 | Fill #3

## 2016-11-30 MED FILL — METHOCARBAMOL 500 MG TABLET: 500 | 30 days supply | Qty: 60 | Fill #2

## 2016-11-30 MED FILL — ?HYDROXYZINE HCL 25 MG TAB: 25 MG | 30 days supply | Qty: 90 | Fill #1

## 2016-11-30 MED FILL — MELOXICAM 7.5 MG TABLET: 7.5 | 30 days supply | Qty: 30 | Fill #1

## 2016-12-11 ENCOUNTER — Ambulatory Visit: Payer: Self-pay | Attending: Family Medicine | Admitting: Family Medicine

## 2016-12-11 ENCOUNTER — Encounter: Payer: Self-pay | Admitting: Family Medicine

## 2016-12-11 VITALS — BP 133/83 | HR 87 | Temp 98.1°F | Ht 61.0 in | Wt 171.8 lb

## 2016-12-11 DIAGNOSIS — G8929 Other chronic pain: Secondary | ICD-10-CM

## 2016-12-11 DIAGNOSIS — F331 Major depressive disorder, recurrent, moderate: Secondary | ICD-10-CM | POA: Insufficient documentation

## 2016-12-11 DIAGNOSIS — R635 Abnormal weight gain: Secondary | ICD-10-CM | POA: Insufficient documentation

## 2016-12-11 DIAGNOSIS — M79601 Pain in right arm: Secondary | ICD-10-CM | POA: Insufficient documentation

## 2016-12-11 DIAGNOSIS — Z79899 Other long term (current) drug therapy: Secondary | ICD-10-CM | POA: Insufficient documentation

## 2016-12-11 DIAGNOSIS — M1990 Unspecified osteoarthritis, unspecified site: Secondary | ICD-10-CM | POA: Insufficient documentation

## 2016-12-11 DIAGNOSIS — K5909 Other constipation: Secondary | ICD-10-CM

## 2016-12-11 DIAGNOSIS — K5900 Constipation, unspecified: Secondary | ICD-10-CM | POA: Insufficient documentation

## 2016-12-11 DIAGNOSIS — J4540 Moderate persistent asthma, uncomplicated: Secondary | ICD-10-CM | POA: Insufficient documentation

## 2016-12-11 DIAGNOSIS — I10 Essential (primary) hypertension: Secondary | ICD-10-CM | POA: Insufficient documentation

## 2016-12-11 DIAGNOSIS — M5441 Lumbago with sciatica, right side: Secondary | ICD-10-CM | POA: Insufficient documentation

## 2016-12-11 DIAGNOSIS — M48062 Spinal stenosis, lumbar region with neurogenic claudication: Secondary | ICD-10-CM | POA: Insufficient documentation

## 2016-12-11 MED ORDER — FLUTICASONE-SALMETEROL 500-50 MCG/DOSE IN AEPB: 1.0000 | INHALATION_SPRAY | Freq: Two times a day (BID) | RESPIRATORY_TRACT | 3 refills | Status: DC

## 2016-12-11 MED ORDER — ALBUTEROL SULFATE HFA 108 (90 BASE) MCG/ACT IN AERS: 2.0000 | INHALATION_SPRAY | Freq: Four times a day (QID) | RESPIRATORY_TRACT | 3 refills | Status: DC | PRN

## 2016-12-11 MED ORDER — FLUOXETINE HCL 40 MG PO CAPS: 40.0000 mg | ORAL_CAPSULE | Freq: Every day | ORAL | 3 refills | Status: DC

## 2016-12-11 MED ORDER — LACTULOSE 10 GM/15ML PO SOLN: 10.0000 g | Freq: Two times a day (BID) | ORAL | 2 refills | Status: DC | PRN

## 2016-12-11 MED ORDER — GABAPENTIN 300 MG PO CAPS: 300.0000 mg | ORAL_CAPSULE | Freq: Two times a day (BID) | ORAL | 3 refills | Status: DC

## 2016-12-11 MED ORDER — TRAZODONE HCL 100 MG PO TABS: 100.0000 mg | ORAL_TABLET | Freq: Every evening | ORAL | 3 refills | Status: DC | PRN

## 2016-12-11 MED ORDER — METHOCARBAMOL 500 MG PO TABS: 500.0000 mg | ORAL_TABLET | Freq: Two times a day (BID) | ORAL | 2 refills | Status: DC | PRN

## 2016-12-11 MED ORDER — ACETAMINOPHEN-CODEINE #3 300-30 MG PO TABS: 1.0000 | ORAL_TABLET | Freq: Two times a day (BID) | ORAL | 2 refills | Status: DC | PRN

## 2016-12-11 MED ORDER — LEVOCETIRIZINE DIHYDROCHLORIDE 5 MG PO TABS: 5.0000 mg | ORAL_TABLET | Freq: Every evening | ORAL | 0 refills | Status: DC

## 2016-12-11 MED ORDER — MELOXICAM 7.5 MG PO TABS: 7.5000 mg | ORAL_TABLET | Freq: Every day | ORAL | 1 refills | Status: DC

## 2016-12-11 MED FILL — traZODone HCL 100 MG TABS: 100 | 30 days supply | Qty: 30 | Fill #0

## 2016-12-11 MED FILL — GABAPENTIN 300 MG CAPSULE: 300 | 30 days supply | Qty: 60 | Fill #0

## 2016-12-11 MED FILL — ACETAMINOPHEN/COD #3 TABLET: 300-30 | 30 days supply | Qty: 60 | Fill #0

## 2016-12-11 NOTE — Progress Notes (Signed)
Would like to start on "water pills" Needs refills on tylenol#3, and other meds but can't remember which ones

## 2016-12-11 NOTE — Progress Notes (Signed)
Subjective:  Patient ID: [name], female    DOB: 01/17/1971  Age: 46 y.o. MRN: [id number]  CC: Back Pain; Arm Pain (right); Edema (arms, lower back, lower legs); and Osteoarthritis   HPI [name] is a 46 year old female with a history of asthma, hypertension, depression, chronic low back pain secondary to spinal stenosis, degenerative disc disease who comes in for a follow-up visit.  She remains on Tylenol 3 for chronic back pain  She is currently under a pain contract. Pain starts in bilateral LE and radiates up to her thighs and up to the lower back and she has noticed progressive difficulty with ambulation to the point where she has to use a cane and her legs give out on her sometimes making her fall.  Unable to see a spine specialist due to lack of medical coverage and she is yet to completely for the [facility] card. Last urine drug screen was negative for Tylenol 3 but she had informed me she had fallen and spilled her medications. She continues to have pain in her right arm against which she fell.  Today she informs me she has fluid in her arms, back and review of her chart indicates she has gained 10 pounds in the last 2 months. She denies excessive eating but is not active either. Denies shortness of breath, orthopnea or chest pains.  Past Medical History:  Diagnosis Date  . Allergy    pollen, pcn  . Asthma   . Depression   . Fibroids   . History of ovarian cyst   . Medical history non-contributory    sickle cell  . Menometrorrhagia   . 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 Sickle cell trait [facility])     Past Surgical History:  Procedure Laterality Date  . CESAREAN SECTION     all three pregnancies    Allergies  Allergen Reactions  . Bupropion Hcl Er (Sr) Hives and Other (See Comments)    The patient has an entire page of reactions that can be caused by the medication, but none that she has suffered.  . Fruit & Vegetable Daily [Nutritional  Supplements] Other (See Comments)    "all fruits causes bumps and weird feeling in her mouth"  . Penicillins Hives  . Pollen Extract-Tree Extract Other (See Comments)    Allergies  . Other Rash    "bugs" roaches     Outpatient Medications Prior to Visit  Medication Sig Dispense Refill  . hydrOXYzine (ATARAX/VISTARIL) 25 MG tablet TAKE 1 TABLET BY MOUTH 3 TIMES DAILY AS NEEDED. 90 tablet 1  . triamcinolone cream (KENALOG) 0.1 % Apply 1 application topically 2 (two) times daily. 30 g 1  . albuterol (VENTOLIN HFA) 108 (90 Base) MCG/ACT inhaler Inhale 2 puffs into the lungs every 6 (six) hours as needed for wheezing or shortness of breath. 54 g 3  . FLUoxetine (PROZAC) 40 MG capsule Take 1 capsule (40 mg total) by mouth daily. 30 capsule 3  . Fluticasone-Salmeterol (ADVAIR DISKUS) 500-50 MCG/DOSE AEPB Inhale 1 puff into the lungs 2 (two) times daily. 180 each 3  . gabapentin (NEURONTIN) 300 MG capsule Take 1 capsule (300 mg total) by mouth 2 (two) times daily. 60 capsule 3  . ipratropium (ATROVENT HFA) 17 MCG/ACT inhaler Inhale 2 puffs into the lungs 2 (two) times daily. 1 Inhaler 12  . lactulose (CHRONULAC) 10 GM/15ML solution Take 15 mLs (10 g total) by mouth 2 (two) times  daily as needed for mild constipation. 946 mL 2  . levocetirizine (XYZAL) 5 MG tablet Take 1 tablet (5 mg total) by mouth every evening. 30 tablet 0  . meloxicam (MOBIC) 7.5 MG tablet Take 1 tablet (7.5 mg total) by mouth daily. 30 tablet 1  . methocarbamol (ROBAXIN) 500 MG tablet Take 1 tablet (500 mg total) by mouth 2 (two) times daily as needed for muscle spasms. 60 tablet 2  . traZODone (DESYREL) 100 MG tablet Take 1 tablet (100 mg total) by mouth at bedtime as needed for sleep. 30 tablet 3  . acetaminophen-codeine (TYLENOL #3) 300-30 MG tablet Take 1 tablet by mouth every 12 (twelve) hours as needed for moderate pain. (Patient not taking: Reported on 12/11/2016) 60 tablet 2  . fluticasone-salmeterol (ADVAIR HFA) 230-21  MCG/ACT inhaler INHALE 2 PUFFS INTO THE LUNGS 2 TIMES DAILY. 8 g 2   No facility-administered medications prior to visit.     ROS Review of Systems Constitutional: Negative for activity change, appetite change and fatigue.  HENT: Negative for congestion, sinus pressure and sore throat.   Eyes: Negative for visual disturbance.  Respiratory: Negative for cough, chest tightness, shortness of breath and wheezing.   Cardiovascular: Negative for chest pain and palpitations.  Gastrointestinal: Negative for abdominal distention, abdominal pain and constipation.  Endocrine: Negative for polydipsia.  Genitourinary: Negative for dysuria and frequency.  Musculoskeletal:       See hpi  Skin: Negative for rash.  Neurological: Positive for weakness and numbness. Negative for tremors and positive for light-headedness.  Hematological: Does not bruise/bleed easily.  Psychiatric/Behavioral: Positive for anxiety and panic attacks. Negative for suicidal ideation  Objective:  BP 133/83 (BP Location: Right Arm, Patient Position: Sitting, Cuff Size: Large)   Pulse 87   Temp 98.1 F (36.7 C) (Oral)   Ht 5\' 1"  (1.549 m)   Wt 171 lb 12.8 oz (77.9 kg)   SpO2 97%   BMI 32.46 kg/m   BP/Weight 12/11/2016 10/09/2016 37/06/6268  Systolic BP 485 462 703  Diastolic BP 83 81 71  Wt. (Lbs) 171.8 161.4 162.6  BMI 32.46 30.5 30.72  Some encounter information is confidential and restricted. Go to Review Flowsheets activity to see all data.      Physical Exam Constitutional: She is oriented to person, place, and time. She appears well-developed and well-nourished.  Cardiovascular: Normal rate, normal heart sounds and intact distal pulses.   No murmur heard. Pulmonary/Chest: Effort normal and breath sounds normal. She has no wheezes. She has no rales. She exhibits no tenderness.  Abdominal: Soft. Bowel sounds are normal. She exhibits no distension and no mass. There is no tenderness.  Musculoskeletal: She  exhibits tenderness to palpation of lumbar spine. Positive straight leg raise bilaterally. Normal deep tendon reflexes bilaterally  Neurological: She is alert and oriented to person, place, and time.Normal knee jerk reflexes bilaterally  Psych: Normal mood and affect.  CMP Latest Ref Rng & Units 10/09/2016 08/02/2016 02/20/2016  Glucose 65 - 99 mg/dL 74 89 77  BUN 7 - 25 mg/dL 7 5(L) 7  Creatinine 0.50 - 1.10 mg/dL 0.67 0.80 0.75  Sodium 135 - 146 mmol/L 140 142 139  Potassium 3.5 - 5.3 mmol/L 4.0 3.4(L) 4.3  Chloride 98 - 110 mmol/L 106 103 106  CO2 20 - 31 mmol/L 25 - 26  Calcium 8.6 - 10.2 mg/dL 8.8 - 8.9  Total Protein 6.1 - 8.1 g/dL - - 6.8  Total Bilirubin 0.2 - 1.2 mg/dL - - 0.6  Alkaline Phos 33 - 115 U/L - - 48  AST 10 - 35 U/L - - 16  ALT 6 - 29 U/L - - 8      Assessment & Plan:   1. Chronic midline low back pain with right-sided sciatica Apply heating pad - methocarbamol (ROBAXIN) 500 MG tablet; Take 1 tablet (500 mg total) by mouth 2 (two) times daily as needed for muscle spasms.  Dispense: 60 tablet; Refill: 2  2. Moderate episode of recurrent major depressive disorder (HCC) Stable - traZODone (DESYREL) 100 MG tablet; Take 1 tablet (100 mg total) by mouth at bedtime as needed for sleep.  Dispense: 30 tablet; Refill: 3 - FLUoxetine (PROZAC) 40 MG capsule; Take 1 capsule (40 mg total) by mouth daily.  Dispense: 30 capsule; Refill: 3  3. Moderate persistent asthma without complication No acute flares - albuterol (VENTOLIN HFA) 108 (90 Base) MCG/ACT inhaler; Inhale 2 puffs into the lungs every 6 (six) hours as needed for wheezing or shortness of breath.  Dispense: 54 g; Refill: 3  4. Other constipation Stable - lactulose (CHRONULAC) 10 GM/15ML solution; Take 15 mLs (10 g total) by mouth 2 (two) times daily as needed for mild constipation.  Dispense: 946 mL; Refill: 2  5. Spinal stenosis, lumbar region, with neurogenic claudication We'll need to see a spine surgeon  ultimately Tylenol 3 absent and urine drug screen - could be secondary to her spilling her medications. We will refill this one time. Discussed the terms of the pain contract again the fact that if her urine drug screen is not congruent with her medication regimen and I will desist from prescribing controlled substances for her. - meloxicam (MOBIC) 7.5 MG tablet; Take 1 tablet (7.5 mg total) by mouth daily.  Dispense: 30 tablet; Refill: 1 - gabapentin (NEURONTIN) 300 MG capsule; Take 1 capsule (300 mg total) by mouth 2 (two) times daily.  Dispense: 60 capsule; Refill: 3 - acetaminophen-codeine (TYLENOL #3) 300-30 MG tablet; Take 1 tablet by mouth every 12 (twelve) hours as needed for moderate pain.  Dispense: 60 tablet; Refill: 2  6. Weight gain Patient will need to increase physical activity We'll need to rule out thyroid disorder. - TSH   Meds ordered this encounter  Medications  . methocarbamol (ROBAXIN) 500 MG tablet    Sig: Take 1 tablet (500 mg total) by mouth 2 (two) times daily as needed for muscle spasms.    Dispense:  60 tablet    Refill:  2  . traZODone (DESYREL) 100 MG tablet    Sig: Take 1 tablet (100 mg total) by mouth at bedtime as needed for sleep.    Dispense:  30 tablet    Refill:  3  . FLUoxetine (PROZAC) 40 MG capsule    Sig: Take 1 capsule (40 mg total) by mouth daily.    Dispense:  30 capsule    Refill:  3  . lactulose (CHRONULAC) 10 GM/15ML solution    Sig: Take 15 mLs (10 g total) by mouth 2 (two) times daily as needed for mild constipation.    Dispense:  946 mL    Refill:  2  . meloxicam (MOBIC) 7.5 MG tablet    Sig: Take 1 tablet (7.5 mg total) by mouth daily.    Dispense:  30 tablet    Refill:  1  . gabapentin (NEURONTIN) 300 MG capsule    Sig: Take 1 capsule (300 mg total) by mouth 2 (two) times daily.    Dispense:  60 capsule    Refill:  3  . levocetirizine (XYZAL) 5 MG tablet    Sig: Take 1 tablet (5 mg total) by mouth every evening.     Dispense:  30 tablet    Refill:  0  . Fluticasone-Salmeterol (ADVAIR DISKUS) 500-50 MCG/DOSE AEPB    Sig: Inhale 1 puff into the lungs 2 (two) times daily.    Dispense:  180 each    Refill:  3  . acetaminophen-codeine (TYLENOL #3) 300-30 MG tablet    Sig: Take 1 tablet by mouth every 12 (twelve) hours as needed for moderate pain.    Dispense:  60 tablet    Refill:  2  . albuterol (VENTOLIN HFA) 108 (90 Base) MCG/ACT inhaler    Sig: Inhale 2 puffs into the lungs every 6 (six) hours as needed for wheezing or shortness of breath.    Dispense:  54 g    Refill:  3    Follow-up: Return in about 3 months (around 03/13/2017) for Follow-up on chronic medical conditions.   Arnoldo Morale MD

## 2016-12-12 LAB — TSH: TSH: 3.1 u[IU]/mL (ref 0.450–4.500)

## 2016-12-19 ENCOUNTER — Telehealth: Payer: Self-pay

## 2016-12-19 NOTE — Telephone Encounter (Signed)
After many attempts on both numbers provided and the inability to LVM writer sent patient a letter with her lab results.

## 2017-01-08 ENCOUNTER — Other Ambulatory Visit: Payer: Self-pay | Admitting: Family Medicine

## 2017-01-08 DIAGNOSIS — M170 Bilateral primary osteoarthritis of knee: Secondary | ICD-10-CM

## 2017-01-08 MED FILL — ?HYDROXYZINE HCL 25 MG TAB: 25 MG | 30 days supply | Qty: 90 | Fill #0

## 2017-01-08 MED FILL — $VENTOLIN HFA 18G INHALER: 108 (90 BAS | 30 days supply | Qty: 18 | Fill #0

## 2017-01-08 MED FILL — MELOXICAM 7.5 MG TABLET: 7.5 | 30 days supply | Qty: 30 | Fill #0

## 2017-01-08 MED FILL — GABAPENTIN 300 MG CAPSULE: 300 | 30 days supply | Qty: 60 | Fill #1

## 2017-01-08 MED FILL — $ADVAIR 500/50MCG INHALER: 500-50 | 30 days supply | Qty: 60 | Fill #2

## 2017-01-08 MED FILL — traZODone HCL 100 MG TABS: 100 | 30 days supply | Qty: 30 | Fill #1

## 2017-01-08 MED FILL — METHOCARBAMOL 500 MG TABLET: 500 | 30 days supply | Qty: 60 | Fill #0

## 2017-01-08 MED FILL — ACETAMINOPHEN/COD #3 TABLET: 300-30 | 30 days supply | Qty: 60 | Fill #1

## 2017-01-08 MED FILL — FLUoxetine HCL 40 MG CAPS: 40 | 30 days supply | Qty: 30 | Fill #2

## 2017-01-09 ENCOUNTER — Ambulatory Visit: Payer: Self-pay | Attending: Family Medicine | Admitting: Family Medicine

## 2017-01-09 ENCOUNTER — Encounter: Payer: Self-pay | Admitting: Family Medicine

## 2017-01-09 VITALS — BP 103/68 | HR 82 | Temp 98.4°F | Ht 61.0 in | Wt 174.2 lb

## 2017-01-09 DIAGNOSIS — G8929 Other chronic pain: Secondary | ICD-10-CM | POA: Insufficient documentation

## 2017-01-09 DIAGNOSIS — I10 Essential (primary) hypertension: Secondary | ICD-10-CM | POA: Insufficient documentation

## 2017-01-09 DIAGNOSIS — F419 Anxiety disorder, unspecified: Secondary | ICD-10-CM | POA: Insufficient documentation

## 2017-01-09 DIAGNOSIS — D573 Sickle-cell trait: Secondary | ICD-10-CM | POA: Insufficient documentation

## 2017-01-09 DIAGNOSIS — K0889 Other specified disorders of teeth and supporting structures: Secondary | ICD-10-CM | POA: Insufficient documentation

## 2017-01-09 DIAGNOSIS — Z880 Allergy status to penicillin: Secondary | ICD-10-CM | POA: Insufficient documentation

## 2017-01-09 DIAGNOSIS — M4800 Spinal stenosis, site unspecified: Secondary | ICD-10-CM | POA: Insufficient documentation

## 2017-01-09 DIAGNOSIS — Z888 Allergy status to other drugs, medicaments and biological substances status: Secondary | ICD-10-CM | POA: Insufficient documentation

## 2017-01-09 DIAGNOSIS — Z91018 Allergy to other foods: Secondary | ICD-10-CM | POA: Insufficient documentation

## 2017-01-09 DIAGNOSIS — Z79899 Other long term (current) drug therapy: Secondary | ICD-10-CM | POA: Insufficient documentation

## 2017-01-09 DIAGNOSIS — J45909 Unspecified asthma, uncomplicated: Secondary | ICD-10-CM | POA: Insufficient documentation

## 2017-01-09 DIAGNOSIS — F329 Major depressive disorder, single episode, unspecified: Secondary | ICD-10-CM | POA: Insufficient documentation

## 2017-01-09 DIAGNOSIS — F418 Other specified anxiety disorders: Secondary | ICD-10-CM | POA: Insufficient documentation

## 2017-01-09 MED ORDER — CLINDAMYCIN HCL 300 MG PO CAPS: 300.0000 mg | ORAL_CAPSULE | Freq: Two times a day (BID) | ORAL | 0 refills | Status: DC

## 2017-01-09 MED ORDER — BUSPIRONE HCL 10 MG PO TABS: 10.0000 mg | ORAL_TABLET | Freq: Two times a day (BID) | ORAL | 3 refills | Status: DC

## 2017-01-09 MED FILL — CLINDAMYCIN HCL 300 MG CAP: 300 | 10 days supply | Qty: 20 | Fill #0

## 2017-01-09 MED FILL — busPIRone HCL 10 MG TABS: 10 | 30 days supply | Qty: 60 | Fill #0
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
## 2017-01-09 NOTE — Patient Instructions (Signed)
Dental Pain Dental pain may be caused by many things, including:  Tooth decay (cavities or caries). Cavities cause the nerve of your tooth to be open to air and hot or cold temperatures. This can cause pain or discomfort.  Abscess or infection. A dental abscess is an area that is full of infected pus from a bacterial infection in the inner part of the tooth (pulp). It usually happens at the end of the tooth's root.  Injury.  An unknown reason (idiopathic). Your pain may be mild or severe. It may only happen when:  You are chewing.  You are exposed to hot or cold temperature.  You are eating or drinking sugary foods or beverages, such as:  Soda.  Candy. Your pain may also be there all of the time. Follow these instructions at home: Watch your dental pain for any changes. Do these things to lessen your discomfort:  Take medicines only as told by your dentist.  If your dentist tells you to take an antibiotic medicine, finish all of it even if you start to feel better.  Keep all follow-up visits as told by your dentist. This is important.  Do not apply heat to the outside of your face.  Rinse your mouth or gargle with salt water if told by your dentist. This helps with pain and swelling.  You can make salt water by adding  tsp of salt to 1 cup of warm water.  Apply ice to the painful area of your face:  Put ice in a plastic bag.  Place a towel between your skin and the bag.  Leave the ice on for 20 minutes, 2-3 times per day.  Avoid foods or drinks that cause you pain, such as:  Very hot or very cold foods or drinks.  Sweet or sugary foods or drinks. Contact a doctor if:  Your pain is not helped with medicines.  Your symptoms are worse.  You have new symptoms. Get help right away if:  You cannot open your mouth.  You are having trouble breathing or swallowing.  You have a fever.  Your face, neck, or jaw is puffy (swollen). This information is not  intended to replace advice given to you by your health care provider. Make sure you discuss any questions you have with your health care provider. Document Released: 02/20/2008 Document Revised: 02/09/2016 Document Reviewed: 08/30/2014 Elsevier Interactive Patient Education  2017 [employer].

## 2017-01-10 ENCOUNTER — Ambulatory Visit: Payer: Self-pay

## 2017-02-04 ENCOUNTER — Telehealth: Payer: Self-pay | Admitting: Family Medicine

## 2017-02-04 DIAGNOSIS — K0889 Other specified disorders of teeth and supporting structures: Secondary | ICD-10-CM

## 2017-02-04 NOTE — Telephone Encounter (Signed)
Patient called the office requesting medication refill for an antibiotic that was called in by provider for her tooth ache. Pt is still in pain.   Thank you.

## 2017-02-05 ENCOUNTER — Other Ambulatory Visit: Payer: Self-pay | Admitting: Family Medicine

## 2017-02-05 DIAGNOSIS — F331 Major depressive disorder, recurrent, moderate: Secondary | ICD-10-CM

## 2017-02-05 DIAGNOSIS — L308 Other specified dermatitis: Secondary | ICD-10-CM

## 2017-02-05 DIAGNOSIS — M48062 Spinal stenosis, lumbar region with neurogenic claudication: Secondary | ICD-10-CM

## 2017-02-05 MED ORDER — CLINDAMYCIN HCL 300 MG PO CAPS: 300.0000 mg | ORAL_CAPSULE | Freq: Two times a day (BID) | ORAL | 0 refills | Status: DC

## 2017-02-05 MED FILL — MELOXICAM 7.5 MG TABLET: 7.5 | 30 days supply | Qty: 30 | Fill #1

## 2017-02-05 MED FILL — FLUoxetine HCL 40 MG CAPS: 40 | 30 days supply | Qty: 30 | Fill #3

## 2017-02-05 MED FILL — $ADVAIR 500/50MCG INHALER: 500-50 | 30 days supply | Qty: 60 | Fill #3

## 2017-02-05 MED FILL — busPIRone HCL 10 MG TABS: 10 | 30 days supply | Qty: 60 | Fill #1

## 2017-02-05 MED FILL — GABAPENTIN 300 MG CAPSULE: 300 | 30 days supply | Qty: 60 | Fill #2

## 2017-02-05 MED FILL — traZODone HCL 100 MG TABS: 100 | 30 days supply | Qty: 30 | Fill #2

## 2017-02-05 MED FILL — ?HYDROXYZINE HCL 25 MG TAB: 25 MG | 30 days supply | Qty: 90 | Fill #1

## 2017-02-05 MED FILL — ?TRIAMCINOLONE 0.1% CREAM: 0.1 | 15 days supply | Qty: 30 | Fill #1

## 2017-02-05 MED FILL — METHOCARBAMOL 500 MG TABLET: 500 | 30 days supply | Qty: 60 | Fill #1

## 2017-02-05 MED FILL — ACETAMINOPHEN/COD #3 TABLET: 300-30 | 30 days supply | Qty: 60 | Fill #2

## 2017-02-05 MED FILL — $VENTOLIN HFA 18G INHALER: 108 (90 BAS | 30 days supply | Qty: 18 | Fill #1

## 2017-02-05 MED FILL — CLINDAMYCIN HCL 300 MG CAP: 300 | 10 days supply | Qty: 20 | Fill #0

## 2017-02-05 NOTE — Telephone Encounter (Signed)
Refilled

## 2017-02-05 NOTE — Telephone Encounter (Signed)
Called and informed patient that medication has been refilled. Thank you.

## 2017-03-04 ENCOUNTER — Telehealth: Payer: Self-pay | Admitting: Family Medicine

## 2017-03-04 DIAGNOSIS — F331 Major depressive disorder, recurrent, moderate: Secondary | ICD-10-CM

## 2017-03-04 DIAGNOSIS — M48062 Spinal stenosis, lumbar region with neurogenic claudication: Secondary | ICD-10-CM

## 2017-03-04 DIAGNOSIS — F419 Anxiety disorder, unspecified: Secondary | ICD-10-CM

## 2017-03-04 DIAGNOSIS — K5909 Other constipation: Secondary | ICD-10-CM

## 2017-03-04 MED ORDER — GABAPENTIN 300 MG PO CAPS: 300.0000 mg | ORAL_CAPSULE | Freq: Two times a day (BID) | ORAL | 2 refills | Status: DC

## 2017-03-04 MED ORDER — FLUTICASONE-SALMETEROL 500-50 MCG/DOSE IN AEPB: 1.0000 | INHALATION_SPRAY | Freq: Two times a day (BID) | RESPIRATORY_TRACT | 2 refills | Status: DC

## 2017-03-04 MED ORDER — LEVOCETIRIZINE DIHYDROCHLORIDE 5 MG PO TABS: 5.0000 mg | ORAL_TABLET | Freq: Every evening | ORAL | 2 refills | Status: DC

## 2017-03-04 MED ORDER — FLUOXETINE HCL 40 MG PO CAPS: 40.0000 mg | ORAL_CAPSULE | Freq: Every day | ORAL | 3 refills | Status: DC

## 2017-03-04 MED ORDER — LACTULOSE 10 GM/15ML PO SOLN: 10.0000 g | Freq: Two times a day (BID) | ORAL | 0 refills | Status: DC | PRN

## 2017-03-04 MED ORDER — BUSPIRONE HCL 10 MG PO TABS: 10.0000 mg | ORAL_TABLET | Freq: Two times a day (BID) | ORAL | 3 refills | Status: DC

## 2017-03-04 NOTE — Telephone Encounter (Signed)
Pt calling to request of "every last one of her current meds". States that she is out of all of her medications.

## 2017-03-04 NOTE — Telephone Encounter (Signed)
Chronic medications refilled.

## 2017-03-11 ENCOUNTER — Other Ambulatory Visit: Payer: Self-pay | Admitting: Family Medicine

## 2017-03-11 DIAGNOSIS — M48062 Spinal stenosis, lumbar region with neurogenic claudication: Secondary | ICD-10-CM

## 2017-03-11 DIAGNOSIS — F331 Major depressive disorder, recurrent, moderate: Secondary | ICD-10-CM

## 2017-03-11 MED FILL — GABAPENTIN 300 MG CAPSULE: 300 | 30 days supply | Qty: 60 | Fill #3

## 2017-03-11 MED FILL — $VENTOLIN HFA 18G INHALER: 108 (90 BAS | 25 days supply | Qty: 18 | Fill #2

## 2017-03-11 MED FILL — METHOCARBAMOL 500 MG TABLET: 500 | 30 days supply | Qty: 60 | Fill #2

## 2017-03-11 MED FILL — busPIRone HCL 10 MG TABS: 10 | 30 days supply | Qty: 60 | Fill #2

## 2017-03-11 MED FILL — FLUoxetine HCL 40 MG CAPS: 40 | 30 days supply | Qty: 30 | Fill #0

## 2017-03-11 MED FILL — $ADVAIR 500/50MCG INHALER: 500-50 | 30 days supply | Qty: 60 | Fill #4

## 2017-03-13 ENCOUNTER — Encounter: Payer: Self-pay | Admitting: Family Medicine

## 2017-03-13 ENCOUNTER — Ambulatory Visit: Payer: Self-pay | Attending: Family Medicine | Admitting: Family Medicine

## 2017-03-13 VITALS — BP 111/74 | HR 88 | Temp 98.5°F | Wt 180.0 lb

## 2017-03-13 DIAGNOSIS — I10 Essential (primary) hypertension: Secondary | ICD-10-CM | POA: Insufficient documentation

## 2017-03-13 DIAGNOSIS — D573 Sickle-cell trait: Secondary | ICD-10-CM | POA: Insufficient documentation

## 2017-03-13 DIAGNOSIS — L309 Dermatitis, unspecified: Secondary | ICD-10-CM | POA: Insufficient documentation

## 2017-03-13 DIAGNOSIS — Z888 Allergy status to other drugs, medicaments and biological substances status: Secondary | ICD-10-CM | POA: Insufficient documentation

## 2017-03-13 DIAGNOSIS — Z9109 Other allergy status, other than to drugs and biological substances: Secondary | ICD-10-CM | POA: Insufficient documentation

## 2017-03-13 DIAGNOSIS — N83209 Unspecified ovarian cyst, unspecified side: Secondary | ICD-10-CM | POA: Insufficient documentation

## 2017-03-13 DIAGNOSIS — F331 Major depressive disorder, recurrent, moderate: Secondary | ICD-10-CM | POA: Insufficient documentation

## 2017-03-13 DIAGNOSIS — L308 Other specified dermatitis: Secondary | ICD-10-CM

## 2017-03-13 DIAGNOSIS — G4709 Other insomnia: Secondary | ICD-10-CM

## 2017-03-13 DIAGNOSIS — G8929 Other chronic pain: Secondary | ICD-10-CM

## 2017-03-13 DIAGNOSIS — J4520 Mild intermittent asthma, uncomplicated: Secondary | ICD-10-CM

## 2017-03-13 DIAGNOSIS — G4700 Insomnia, unspecified: Secondary | ICD-10-CM | POA: Insufficient documentation

## 2017-03-13 DIAGNOSIS — Z91018 Allergy to other foods: Secondary | ICD-10-CM | POA: Insufficient documentation

## 2017-03-13 DIAGNOSIS — Z9889 Other specified postprocedural states: Secondary | ICD-10-CM | POA: Insufficient documentation

## 2017-03-13 DIAGNOSIS — Z880 Allergy status to penicillin: Secondary | ICD-10-CM | POA: Insufficient documentation

## 2017-03-13 DIAGNOSIS — J45909 Unspecified asthma, uncomplicated: Secondary | ICD-10-CM | POA: Insufficient documentation

## 2017-03-13 DIAGNOSIS — M5441 Lumbago with sciatica, right side: Secondary | ICD-10-CM | POA: Insufficient documentation

## 2017-03-13 MED ORDER — TRAZODONE HCL 50 MG PO TABS: 50.0000 mg | ORAL_TABLET | Freq: Every evening | ORAL | 0 refills | Status: DC | PRN

## 2017-03-13 MED ORDER — METHOCARBAMOL 500 MG PO TABS: 500.0000 mg | ORAL_TABLET | Freq: Two times a day (BID) | ORAL | 2 refills | Status: DC | PRN

## 2017-03-13 MED ORDER — TRIAMCINOLONE ACETONIDE 0.1 % EX CREA: 1.0000 | TOPICAL_CREAM | Freq: Two times a day (BID) | CUTANEOUS | 1 refills | Status: DC

## 2017-03-13 MED ORDER — CLONIDINE HCL 0.1 MG PO TABS: 0.1000 mg | ORAL_TABLET | Freq: Every day | ORAL | 3 refills | Status: DC

## 2017-03-13 MED FILL — MELOXICAM 7.5 MG TABLET: 7.5 | 30 days supply | Qty: 30 | Fill #0

## 2017-03-13 MED FILL — ?TRAZODONE 50 MG TABLET: 50 | 30 days supply | Qty: 30 | Fill #0

## 2017-03-13 MED FILL — ACETAMINOPHEN/COD #3 TABLET: 300-30 | 30 days supply | Qty: 60 | Fill #0

## 2017-03-13 MED FILL — METHOCARBAMOL 500 MG TABLET: 500 | 30 days supply | Qty: 60 | Fill #0

## 2017-03-13 MED FILL — cloNIDine HCL 0.1 MG TABS: 0.1 | 30 days supply | Qty: 30 | Fill #0

## 2017-03-13 MED FILL — ?TRIAMCINOLONE 0.1% CREAM: 0.1 | 15 days supply | Qty: 30 | Fill #0

## 2017-03-13 NOTE — Progress Notes (Signed)
Subjective:  Patient ID: [name], female    DOB: 1970/10/31  Age: 46 y.o. MRN: [id number]  CC: Follow-up   HPI [name]  is a 46 year old female with a history of asthma, hypertension, depression, chronic low back pain secondary to spinal stenosis, degenerative disc disease who comes in for a follow-up visit.  Her depression has been controlled on her current regimen and she denies asthma exacerbation.  She remains on Tylenol 3 for chronic back pain  She is currently under a pain contract. Pain starts in bilateral LE and radiates up to her thighs and up to the lower back and she has noticed progressive difficulty with ambulation to the point where she has to use a cane and her legs give out on her sometimes making her fall.  Unable to see a spine specialist due to lack of medical coverage and she is yet to completely for the [facility] card.  She informs me that her friend has complained that she fights in her sleep and kicks her legs; she is wondering if this could be due to the trazodone. She endorses hot flashes at nighttime.  She has also noticed a so called Rainbow range around her right eye for the last few days but denies any blurry vision or eye pain. She has not had any recent eye exam.  Past Medical History:  Diagnosis Date  . Allergy    pollen, pcn  . Asthma   . Depression   . Fibroids   . History of ovarian cyst   . Medical history non-contributory    sickle cell  . Menometrorrhagia   . 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 Sickle cell trait [facility])     Past Surgical History:  Procedure Laterality Date  . CESAREAN SECTION     all three pregnancies    Allergies  Allergen Reactions  . Bupropion Hcl Er (Sr) Hives and Other (See Comments)    The patient has an entire page of reactions that can be caused by the medication, but none that she has suffered.  . Fruit & Vegetable Daily [Nutritional Supplements] Other (See Comments)    "all fruits  causes bumps and weird feeling in her mouth"  . Penicillins Hives  . Pollen Extract-Tree Extract Other (See Comments)    Allergies  . Other Rash    "bugs" roaches     Outpatient Medications Prior to Visit  Medication Sig Dispense Refill  . acetaminophen-codeine (TYLENOL #3) 300-30 MG tablet TAKE 1 TABLET BY MOUTH EVERY 12 HOURS AS NEEDED FOR MODERATE PAIN 60 tablet 2  . albuterol (VENTOLIN HFA) 108 (90 Base) MCG/ACT inhaler Inhale 2 puffs into the lungs every 6 (six) hours as needed for wheezing or shortness of breath. 54 g 3  . busPIRone (BUSPAR) 10 MG tablet Take 1 tablet (10 mg total) by mouth 2 (two) times daily. 60 tablet 3  . FLUoxetine (PROZAC) 40 MG capsule Take 1 capsule (40 mg total) by mouth daily. 30 capsule 3  . FLUoxetine (PROZAC) 40 MG capsule TAKE 1 CAPSULE BY MOUTH DAILY. 30 capsule 3  . Fluticasone-Salmeterol (ADVAIR DISKUS) 500-50 MCG/DOSE AEPB Inhale 1 puff into the lungs 2 (two) times daily. 60 each 2  . gabapentin (NEURONTIN) 300 MG capsule Take 1 capsule (300 mg total) by mouth 2 (two) times daily. 60 capsule 2  . lactulose (CHRONULAC) 10 GM/15ML solution Take 15 mLs (10 g total) by mouth 2 (two) times daily  as needed for mild constipation. 946 mL 0  . levocetirizine (XYZAL) 5 MG tablet Take 1 tablet (5 mg total) by mouth every evening. 30 tablet 2  . meloxicam (MOBIC) 7.5 MG tablet TAKE 1 TABLET (7.5 MG TOTAL) BY MOUTH DAILY. 30 tablet 1  . clindamycin (CLEOCIN) 300 MG capsule Take 1 capsule (300 mg total) by mouth 2 (two) times daily. 20 capsule 0  . methocarbamol (ROBAXIN) 500 MG tablet Take 1 tablet (500 mg total) by mouth 2 (two) times daily as needed for muscle spasms. 60 tablet 2  . naproxen (NAPROSYN) 500 MG tablet TAKE 1 TABLET BY MOUTH 2 TIMES DAILY WITH A MEAL. 60 tablet 3  . traZODone (DESYREL) 100 MG tablet Take 1 tablet (100 mg total) by mouth at bedtime as needed for sleep. 30 tablet 3  . triamcinolone cream (KENALOG) 0.1 % Apply 1 application topically  2 (two) times daily. 30 g 1   No facility-administered medications prior to visit.     ROS Review of Systems Constitutional: Negative for activity change, appetite change and fatigue.  HENT: Negative for congestion, sinus pressure and sore throat. Positive for toothache   Eyes: see hpi.  Respiratory: Negative for cough, chest tightness, shortness of breath and wheezing.   Cardiovascular: Negative for chest pain and palpitations.  Gastrointestinal: Negative for abdominal distention, abdominal pain and constipation.  Endocrine: Negative for polydipsia.  Genitourinary: Negative for dysuria and frequency.  Musculoskeletal:    See hpi Skin: Negative for rash.  Neurological: Positive for weakness and numbness. Negative for tremors and positive for light-headedness.  Hematological: Does not bruise/bleed easily.  Psychiatric/Behavioral: Positive for anxiety and panic attacks. Negative for suicidal ideation  Objective:  BP 111/74   Pulse 88   Temp 98.5 F (36.9 C) (Oral)   Wt 180 lb (81.6 kg)   SpO2 97%   BMI 34.01 kg/m   BP/Weight 03/13/2017 01/09/2017 5/85/2778  Systolic BP 242 353 614  Diastolic BP 74 68 83  Wt. (Lbs) 180 174.2 171.8  BMI 34.01 32.91 32.46  Some encounter information is confidential and restricted. Go to Review Flowsheets activity to see all data.      Physical Exam Constitutional: She is oriented to person, place, and time. She appears well-developed and well-nourished.  Eyes: Normal extraocular muscles, normal sclera, normal pupils, funduscopy appears normal. HEENT: Couple of cracked teeth with dental caries Cardiovascular: Normal rate, normal heart sounds and intact distal pulses.   No murmur heard. Pulmonary/Chest: Effort normal and breath sounds normal. She has no wheezes. She has no rales. She exhibits no tenderness.  Abdominal: Soft. Bowel sounds are normal. She exhibits no distension and no mass. There is no tenderness.  Musculoskeletal: She exhibits  tenderness to palpation of lumbar spine. Positive straight leg raise bilaterally. Normal deep tendon reflexes bilaterally  Neurological: She is alert and oriented to person, place, and time.Normal knee jerk reflexes bilaterally  Psych: Normal mood and affect.  Assessment & Plan:   1. Other eczema Controlled - triamcinolone cream (KENALOG) 0.1 %; Apply 1 application topically 2 (two) times daily.  Dispense: 30 g; Refill: 1  2. Moderate episode of recurrent major depressive disorder ([place]) Depression is controlled on Prozac I will be tapering her off trazodone due to sleep disorder - traZODone (DESYREL) 50 MG tablet; Take 1 tablet (50 mg total) by mouth at bedtime as needed for sleep.  Dispense: 30 tablet; Refill: 0  3. Chronic midline low back pain with right-sided sciatica Back pain is controlled  on current regimen Continue Tylenol 3 - methocarbamol (ROBAXIN) 500 MG tablet; Take 1 tablet (500 mg total) by mouth 2 (two) times daily as needed for muscle spasms.  Dispense: 60 tablet; Refill: 2 - Drug Screen, Urine  4. Other insomnia Advised on instructions to taper off trazodone due to complains of fighting in her sleep Commenced on clonidine-discussed the antihypertensive property of this medication and she knows to discontinue it in the event that she develops lightheadedness. - cloNIDine (CATAPRES) 0.1 MG tablet; Take 1 tablet (0.1 mg total) by mouth at bedtime.  Dispense: 30 tablet; Refill: 3  5. Asthma No recent exacerbation Continue Advair and Proventil MDI  Advised she would need to schedule an ophthalmology appointment to evaluate 'rainbow' around her right eye is optic exam today is unrevealing.  Meds ordered this encounter  Medications  . triamcinolone cream (KENALOG) 0.1 %    Sig: Apply 1 application topically 2 (two) times daily.    Dispense:  30 g    Refill:  1  . traZODone (DESYREL) 50 MG tablet    Sig: Take 1 tablet (50 mg total) by mouth at bedtime as needed for  sleep.    Dispense:  30 tablet    Refill:  0  . methocarbamol (ROBAXIN) 500 MG tablet    Sig: Take 1 tablet (500 mg total) by mouth 2 (two) times daily as needed for muscle spasms.    Dispense:  60 tablet    Refill:  2  . cloNIDine (CATAPRES) 0.1 MG tablet    Sig: Take 1 tablet (0.1 mg total) by mouth at bedtime.    Dispense:  30 tablet    Refill:  3    Follow-up: Return in about 3 months (around 06/13/2017) for Follow-up of chronic medical conditions.    This note has been created with Surveyor, quantity. Any transcriptional errors are unintentional.     Arnoldo Morale MD

## 2017-03-14 LAB — DRUG SCREEN, URINE
AMPHETAMINES, URINE: NEGATIVE ng/mL
Barbiturate screen, urine: NEGATIVE ng/mL
Benzodiazepine Quant, Ur: NEGATIVE ng/mL
CANNABINOID QUANT UR: NEGATIVE ng/mL
COCAINE (METAB.): NEGATIVE ng/mL
Opiate Quant, Ur: NEGATIVE ng/mL
PCP QUANT UR: NEGATIVE ng/mL

## 2017-04-08 ENCOUNTER — Other Ambulatory Visit: Payer: Self-pay | Admitting: Family Medicine

## 2017-04-08 DIAGNOSIS — K0889 Other specified disorders of teeth and supporting structures: Secondary | ICD-10-CM

## 2017-04-10 ENCOUNTER — Other Ambulatory Visit: Payer: Self-pay | Admitting: Family Medicine

## 2017-04-10 DIAGNOSIS — M48062 Spinal stenosis, lumbar region with neurogenic claudication: Secondary | ICD-10-CM

## 2017-04-10 DIAGNOSIS — L308 Other specified dermatitis: Secondary | ICD-10-CM

## 2017-04-10 DIAGNOSIS — F419 Anxiety disorder, unspecified: Secondary | ICD-10-CM

## 2017-04-10 MED ORDER — TIZANIDINE HCL 4 MG PO TABS: 4.0000 mg | ORAL_TABLET | Freq: Three times a day (TID) | ORAL | 2 refills | Status: DC | PRN

## 2017-04-10 MED FILL — FLUoxetine HCL 40 MG CAPS: 40 | 30 days supply | Qty: 30 | Fill #0

## 2017-04-10 MED FILL — $VENTOLIN HFA 18G INHALER: 108 (90 BAS | 25 days supply | Qty: 18 | Fill #3

## 2017-04-10 MED FILL — ACETAMINOPHEN/COD #3 TABLET: 300-30 | 30 days supply | Qty: 60 | Fill #1

## 2017-04-10 MED FILL — traZODone HCL 50 MG TABS: 50 | 30 days supply | Qty: 30 | Fill #1

## 2017-04-10 MED FILL — ?CLONIDINE HCL 0.1 MG TABL: 0.1 | 30 days supply | Qty: 30 | Fill #1

## 2017-04-10 MED FILL — $ADVAIR 500/50MCG INHALER: 500-50 | 30 days supply | Qty: 60 | Fill #5

## 2017-04-10 MED FILL — GABAPENTIN 300 MG CAPSULE: 300 | 30 days supply | Qty: 60 | Fill #2

## 2017-04-10 MED FILL — tiZANidine HCL 4 MG TABS: 4 | 30 days supply | Qty: 90 | Fill #0

## 2017-04-10 MED FILL — TRIAMCINOLONE 0.1% CREAM: 0.1 | 15 days supply | Qty: 30 | Fill #1

## 2017-04-10 MED FILL — MELOXICAM 7.5 MG TABLET: 7.5 | 30 days supply | Qty: 30 | Fill #1

## 2017-04-10 MED FILL — busPIRone HCL 10 MG TABS: 10 | 30 days supply | Qty: 60 | Fill #3

## 2017-04-29 ENCOUNTER — Encounter: Payer: Self-pay | Admitting: Family Medicine

## 2017-04-29 ENCOUNTER — Ambulatory Visit: Payer: Self-pay

## 2017-04-29 ENCOUNTER — Ambulatory Visit: Payer: Self-pay | Attending: Family Medicine | Admitting: Family Medicine

## 2017-04-29 ENCOUNTER — Ambulatory Visit: Payer: Self-pay | Admitting: Licensed Clinical Social Worker

## 2017-04-29 VITALS — BP 114/79 | HR 61 | Temp 98.1°F | Ht 61.0 in | Wt 183.0 lb

## 2017-04-29 DIAGNOSIS — K0381 Cracked tooth: Secondary | ICD-10-CM | POA: Insufficient documentation

## 2017-04-29 DIAGNOSIS — Z880 Allergy status to penicillin: Secondary | ICD-10-CM | POA: Insufficient documentation

## 2017-04-29 DIAGNOSIS — Z79899 Other long term (current) drug therapy: Secondary | ICD-10-CM | POA: Insufficient documentation

## 2017-04-29 DIAGNOSIS — D573 Sickle-cell trait: Secondary | ICD-10-CM | POA: Insufficient documentation

## 2017-04-29 DIAGNOSIS — J45909 Unspecified asthma, uncomplicated: Secondary | ICD-10-CM | POA: Insufficient documentation

## 2017-04-29 DIAGNOSIS — I10 Essential (primary) hypertension: Secondary | ICD-10-CM | POA: Insufficient documentation

## 2017-04-29 DIAGNOSIS — K029 Dental caries, unspecified: Secondary | ICD-10-CM | POA: Insufficient documentation

## 2017-04-29 DIAGNOSIS — M5010 Cervical disc disorder with radiculopathy, unspecified cervical region: Secondary | ICD-10-CM | POA: Insufficient documentation

## 2017-04-29 DIAGNOSIS — G8929 Other chronic pain: Secondary | ICD-10-CM | POA: Insufficient documentation

## 2017-04-29 DIAGNOSIS — M48062 Spinal stenosis, lumbar region with neurogenic claudication: Secondary | ICD-10-CM | POA: Insufficient documentation

## 2017-04-29 DIAGNOSIS — F329 Major depressive disorder, single episode, unspecified: Secondary | ICD-10-CM | POA: Insufficient documentation

## 2017-04-29 DIAGNOSIS — F419 Anxiety disorder, unspecified: Secondary | ICD-10-CM

## 2017-04-29 MED ORDER — CYCLOBENZAPRINE HCL 10 MG PO TABS: 10.0000 mg | ORAL_TABLET | Freq: Two times a day (BID) | ORAL | 1 refills | Status: DC | PRN

## 2017-04-29 MED FILL — ?CYCLOBENZAPRINE 10 MG TABL: 10 | 30 days supply | Qty: 60 | Fill #0

## 2017-04-29 NOTE — Progress Notes (Signed)
Subjective:  Patient ID: [name], female    DOB: [birth year]  Age: 46 y.o. MRN: [id number]  CC: Medication Reaction   HPI [name] is a 46 year old female with a history of asthma, hypertension, depression, chronic low back pain secondary to spinal stenosis, degenerative disc disease who comes in Complaining of adverse effects from tizanidine.  She had been placed on tizanidine for treatment of her low back pain due to Robaxin being on back order. She complains of "crazy thoughts" with the use of tizanidine I would like to be switched to some other muscle relaxant.  Past Medical History:  Diagnosis Date  . Allergy    pollen, pcn  . Asthma   . Depression   . Fibroids   . History of ovarian cyst   . Medical history non-contributory    sickle cell  . Menometrorrhagia   . 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 Sickle cell trait [facility])     Past Surgical History:  Procedure Laterality Date  . CESAREAN SECTION     all three pregnancies    Allergies  Allergen Reactions  . Bupropion Hcl Er (Sr) Hives and Other (See Comments)    The patient has an entire page of reactions that can be caused by the medication, but none that she has suffered.  . Fruit & Vegetable Daily [Nutritional Supplements] Other (See Comments)    "all fruits causes bumps and weird feeling in her mouth"  . Penicillins Hives  . Pollen Extract-Tree Extract Other (See Comments)    Allergies  . Other Rash    "bugs" roaches      Outpatient Medications Prior to Visit  Medication Sig Dispense Refill  . acetaminophen-codeine (TYLENOL #3) 300-30 MG tablet TAKE 1 TABLET BY MOUTH EVERY 12 HOURS AS NEEDED FOR MODERATE PAIN 60 tablet 2  . albuterol (VENTOLIN HFA) 108 (90 Base) MCG/ACT inhaler Inhale 2 puffs into the lungs every 6 (six) hours as needed for wheezing or shortness of breath. 54 g 3  . busPIRone (BUSPAR) 10 MG tablet Take 1 tablet (10 mg total) by mouth 2 (two) times daily. 60 tablet 3    . cloNIDine (CATAPRES) 0.1 MG tablet Take 1 tablet (0.1 mg total) by mouth at bedtime. 30 tablet 3  . FLUoxetine (PROZAC) 40 MG capsule Take 1 capsule (40 mg total) by mouth daily. 30 capsule 3  . FLUoxetine (PROZAC) 40 MG capsule TAKE 1 CAPSULE BY MOUTH DAILY. 30 capsule 3  . Fluticasone-Salmeterol (ADVAIR DISKUS) 500-50 MCG/DOSE AEPB Inhale 1 puff into the lungs 2 (two) times daily. 60 each 2  . gabapentin (NEURONTIN) 300 MG capsule Take 1 capsule (300 mg total) by mouth 2 (two) times daily. 60 capsule 2  . lactulose (CHRONULAC) 10 GM/15ML solution Take 15 mLs (10 g total) by mouth 2 (two) times daily as needed for mild constipation. 946 mL 0  . levocetirizine (XYZAL) 5 MG tablet Take 1 tablet (5 mg total) by mouth every evening. 30 tablet 2  . meloxicam (MOBIC) 7.5 MG tablet TAKE 1 TABLET (7.5 MG TOTAL) BY MOUTH DAILY. 30 tablet 1  . traZODone (DESYREL) 50 MG tablet Take 1 tablet (50 mg total) by mouth at bedtime as needed for sleep. 30 tablet 0  . triamcinolone cream (KENALOG) 0.1 % Apply 1 application topically 2 (two) times daily. 30 g 1  . tiZANidine (ZANAFLEX) 4 MG tablet Take 1 tablet (4 mg total) by mouth every 8 (eight)  hours as needed for muscle spasms. (Patient not taking: Reported on 04/29/2017) 90 tablet 2   No facility-administered medications prior to visit.     ROS Review of Systems Constitutional: Negative for activity change, appetite change and fatigue.  HENT: Negative for congestion, sinus pressure and sore throat. Positive for toothache   Eyes: see hpi.  Respiratory: Negative for cough, chest tightness, shortness of breath and wheezing.   Cardiovascular: Negative for chest pain and palpitations.  Gastrointestinal: Negative for abdominal distention, abdominal pain and constipation.  Endocrine: Negative for polydipsia.  Genitourinary: Negative for dysuria and frequency.  Musculoskeletal:    See hpi Skin: Negative for rash.  Neurological: Positive for weakness and  numbness. Negative for tremors and positive for light-headedness.  Hematological: Does not bruise/bleed easily.  Psychiatric/Behavioral: Positive for anxiety and panic attacks. Negative for suicidal ideation  Objective:  BP 114/79   Pulse 61   Temp 98.1 F (36.7 C) (Oral)   Ht 5\' 1"  (1.549 m)   Wt 183 lb (83 kg)   SpO2 98%   BMI 34.58 kg/m   BP/Weight 04/29/2017 03/13/2017 05/15/5620  Systolic BP 308 657 846  Diastolic BP 79 74 68  Wt. (Lbs) 183 180 174.2  BMI 34.58 34.01 32.91  Some encounter information is confidential and restricted. Go to Review Flowsheets activity to see all data.      Physical Exam Constitutional: She is oriented to person, place, and time. She appears well-developed and well-nourished.  Eyes: Normal extraocular muscles, normal sclera, normal pupils, funduscopy appears normal. HEENT: Couple of cracked teeth with dental caries Cardiovascular: Normal rate, normal heart sounds and intact distal pulses.   No murmur heard. Pulmonary/Chest: Effort normal and breath sounds normal. She has no wheezes. She has no rales. She exhibits no tenderness.  Abdominal: Soft. Bowel sounds are normal. She exhibits no distension and no mass. There is no tenderness.  Musculoskeletal: She exhibits tenderness to palpation of lumbar spine. Positive straight leg raise bilaterally. Normal deep tendon reflexes bilaterally  Neurological: She is alert and oriented to person, place, and time.Normal knee jerk reflexes bilaterally  Psych: Normal mood and affect.  Assessment & Plan:   1. Cervical disc disorder with radiculopathy of cervical region Unable to tolerate tizanidine Switch to Flexeril - cyclobenzaprine (FLEXERIL) 10 MG tablet; Take 1 tablet (10 mg total) by mouth 2 (two) times daily as needed for muscle spasms.  Dispense: 60 tablet; Refill: 1  2. Spinal stenosis, lumbar region, with neurogenic claudication - cyclobenzaprine (FLEXERIL) 10 MG tablet; Take 1 tablet (10 mg  total) by mouth 2 (two) times daily as needed for muscle spasms.  Dispense: 60 tablet; Refill: 1   Meds ordered this encounter  Medications  . cyclobenzaprine (FLEXERIL) 10 MG tablet    Sig: Take 1 tablet (10 mg total) by mouth 2 (two) times daily as needed for muscle spasms.    Dispense:  60 tablet    Refill:  1    Discontinue tizanidine    Follow-up: Return for Follow-up of chronic medical conditions, keep previously scheduled appointment.   This note has been created with Surveyor, quantity. Any transcriptional errors are unintentional.     Arnoldo Morale MD

## 2017-04-29 NOTE — BH Specialist Note (Signed)
Integrated Behavioral Health Initial Visit  MRN: [id number] Name: [name]   Session Start time: 9:45 AM Session End time: 10:15 AM Total time: 30 minutes  Type of Service: [place] Interpretor:No. Interpretor Name and Language: N/A   Warm Hand Off Completed.       SUBJECTIVE: [name] is a 46 y.o. female accompanied by patient. Patient was referred by Dr. Jarold Song for depression. Patient reports the following symptoms/concerns: headaches, confusion, dizzy spells, and irritability Duration of problem: Pt reports being diagnosed with depression in 2006 and anxiety in 2017. Onset of current symptoms 3 weeks ago; Severity of problem: severe  OBJECTIVE: Mood: Anxious and Irritable and Affect: Appropriate Risk of harm to self or others: No plan to harm self or others   LIFE CONTEXT: Family and Social: Pt's support system consists of fiancee and adult daughter who visits pt everyday. School/Work: Pt has applied for disability Self-Care: Pt smokes cigarettes Life Changes: Pt reports chronic pain. She disclosed having thoughts of hurting others in the last three weeks after being placed on pain medication. Pt does not have a plan to harm others or access to weapons.  GOALS ADDRESSED: Patient will reduce symptoms of: agitation and anxiety and increase knowledge and/or ability of: coping skills and also: Increase adequate support systems for patient/family and consult with PCP regarding pain medication   INTERVENTIONS: Solution-Focused Strategies, Mindfulness or Relaxation Training, Supportive Counseling and Link to [employer]  Standardized Assessments completed: Patient declined screening  ASSESSMENT: Pt currently experiencing anxiety and agitation triggered by a medication reaction prescribed to decrease chronic pain. Pt reports headaches, confusion, dizzy spells, and irritability. Pt may benefit from psychotherapy and  medication management. LCSWA educated pt on the correlation between one's physical and mental health. LCSWA discussed benefits of consulting with PCP about the adverse side effects of prescribed medication, in addition, to initiating behavioral health services. Pt was provided community resources for crisis intervention, psychotherapy, and medication management. LCSWA assisted pt in scheduling appointment with PCP today to address medication concerns. Pt provided with transportation assistance.   PLAN: 1. Follow up with behavioral health clinician on : Pt was encouraged tocontact LCSWA if symptoms worsen or fail to improveto schedule behavioral appointments at [facility]. 2. Behavioral recommendations: LCSWA recommends that pt apply healthy coping skills discussed and utilize provided resources. Pt is encouraged to schedule follow up appointment with LCSWA 3. Referral(s): [place] (In Clinic) and [employer] Resources:  Transportation 4. "From scale of 1-10, how likely are you to follow plan?": 9/10  Rebekah [place], LCSW 05/01/17 2:50 PM

## 2017-05-08 ENCOUNTER — Other Ambulatory Visit: Payer: Self-pay | Admitting: Family Medicine

## 2017-05-08 DIAGNOSIS — F419 Anxiety disorder, unspecified: Secondary | ICD-10-CM

## 2017-05-08 DIAGNOSIS — M48062 Spinal stenosis, lumbar region with neurogenic claudication: Secondary | ICD-10-CM

## 2017-05-08 MED FILL — $ADVAIR 500/50MCG INHALER: 500-50 | 30 days supply | Qty: 60 | Fill #6

## 2017-05-08 MED FILL — ?CLONIDINE HCL 0.1 MG TABL: 0.1 | 30 days supply | Qty: 30 | Fill #2

## 2017-05-08 MED FILL — $VENTOLIN HFA 18G INHALER: 108 (90 BAS | 25 days supply | Qty: 18 | Fill #4

## 2017-05-08 MED FILL — GABAPENTIN 300 MG CAPSULE: 300 | 30 days supply | Qty: 60 | Fill #3

## 2017-05-08 MED FILL — busPIRone HCL 10 MG TABS: 10 | 30 days supply | Qty: 60 | Fill #0

## 2017-05-08 MED FILL — FLUoxetine HCL 40 MG CAPS: 40 | 30 days supply | Qty: 30 | Fill #1

## 2017-05-08 MED FILL — ACETAMINOPHEN/COD #3 TABLET: 300-30 | 30 days supply | Qty: 60 | Fill #2

## 2017-05-09 MED FILL — MELOXICAM 7.5 MG TABLET: 7.5 | 30 days supply | Qty: 30 | Fill #0

## 2017-05-27 ENCOUNTER — Other Ambulatory Visit: Payer: Self-pay | Admitting: Family Medicine

## 2017-05-27 DIAGNOSIS — M5010 Cervical disc disorder with radiculopathy, unspecified cervical region: Secondary | ICD-10-CM

## 2017-05-27 DIAGNOSIS — M48062 Spinal stenosis, lumbar region with neurogenic claudication: Secondary | ICD-10-CM

## 2017-05-27 MED FILL — ?CYCLOBENZAPRINE 10 MG TABL: 10 | 30 days supply | Qty: 60 | Fill #1

## 2017-06-05 ENCOUNTER — Other Ambulatory Visit: Payer: Self-pay | Admitting: Family Medicine

## 2017-06-05 DIAGNOSIS — G4709 Other insomnia: Secondary | ICD-10-CM

## 2017-06-05 DIAGNOSIS — M48062 Spinal stenosis, lumbar region with neurogenic claudication: Secondary | ICD-10-CM

## 2017-06-05 DIAGNOSIS — J4530 Mild persistent asthma, uncomplicated: Secondary | ICD-10-CM

## 2017-06-05 MED FILL — FLUoxetine HCL 40 MG CAPS: 40 | 30 days supply | Qty: 30 | Fill #2

## 2017-06-05 MED FILL — cloNIDine HCL 0.1 MG TABS: 0.1 | 30 days supply | Qty: 30 | Fill #3

## 2017-06-05 MED FILL — busPIRone HCL 10 MG TABS: 10 | 30 days supply | Qty: 60 | Fill #1

## 2017-06-05 MED FILL — $VENTOLIN HFA 18G INHALER: 108 (90 BAS | 25 days supply | Qty: 18 | Fill #5

## 2017-06-05 MED FILL — $ADVAIR 500/50MCG INHALER: 500-50 | 30 days supply | Qty: 60 | Fill #0

## 2017-06-05 MED FILL — MELOXICAM 7.5 MG TABLET: 7.5 | 30 days supply | Qty: 30 | Fill #1

## 2017-06-06 NOTE — Telephone Encounter (Signed)
Will be addressed at her office visit.

## 2017-06-07 NOTE — Telephone Encounter (Signed)
Pt called requesting medication refill on acetaminophen-codeine (TYLENOL #3) 300-30 MG tablet Please f/up

## 2017-06-10 MED ORDER — ACETAMINOPHEN-CODEINE #3 300-30 MG PO TABS: ORAL_TABLET | ORAL | 0 refills | Status: DC

## 2017-06-10 NOTE — Telephone Encounter (Signed)
She needs an OV. Refilled.

## 2017-06-12 ENCOUNTER — Encounter: Payer: Self-pay | Admitting: Family Medicine

## 2017-06-12 ENCOUNTER — Ambulatory Visit: Payer: Self-pay | Attending: Family Medicine | Admitting: Family Medicine

## 2017-06-12 ENCOUNTER — Other Ambulatory Visit: Payer: Self-pay | Admitting: Family Medicine

## 2017-06-12 VITALS — BP 110/75 | HR 69 | Temp 98.1°F | Ht 61.0 in | Wt 183.0 lb

## 2017-06-12 DIAGNOSIS — G4700 Insomnia, unspecified: Secondary | ICD-10-CM | POA: Insufficient documentation

## 2017-06-12 DIAGNOSIS — F32A Depression, unspecified: Secondary | ICD-10-CM

## 2017-06-12 DIAGNOSIS — M545 Low back pain: Secondary | ICD-10-CM | POA: Insufficient documentation

## 2017-06-12 DIAGNOSIS — I10 Essential (primary) hypertension: Secondary | ICD-10-CM | POA: Insufficient documentation

## 2017-06-12 DIAGNOSIS — F329 Major depressive disorder, single episode, unspecified: Secondary | ICD-10-CM

## 2017-06-12 DIAGNOSIS — G8929 Other chronic pain: Secondary | ICD-10-CM | POA: Insufficient documentation

## 2017-06-12 DIAGNOSIS — Z79899 Other long term (current) drug therapy: Secondary | ICD-10-CM | POA: Insufficient documentation

## 2017-06-12 DIAGNOSIS — M5010 Cervical disc disorder with radiculopathy, unspecified cervical region: Secondary | ICD-10-CM

## 2017-06-12 DIAGNOSIS — M48062 Spinal stenosis, lumbar region with neurogenic claudication: Secondary | ICD-10-CM

## 2017-06-12 DIAGNOSIS — G4709 Other insomnia: Secondary | ICD-10-CM

## 2017-06-12 DIAGNOSIS — J4520 Mild intermittent asthma, uncomplicated: Secondary | ICD-10-CM

## 2017-06-12 DIAGNOSIS — F331 Major depressive disorder, recurrent, moderate: Secondary | ICD-10-CM

## 2017-06-12 DIAGNOSIS — R600 Localized edema: Secondary | ICD-10-CM

## 2017-06-12 DIAGNOSIS — D573 Sickle-cell trait: Secondary | ICD-10-CM | POA: Insufficient documentation

## 2017-06-12 MED ORDER — FLUOXETINE HCL 40 MG PO CAPS: 40.0000 mg | ORAL_CAPSULE | Freq: Every day | ORAL | 3 refills | Status: DC

## 2017-06-12 MED ORDER — LEVOCETIRIZINE DIHYDROCHLORIDE 5 MG PO TABS: 5.0000 mg | ORAL_TABLET | Freq: Every evening | ORAL | 2 refills | Status: DC

## 2017-06-12 MED ORDER — FUROSEMIDE 20 MG PO TABS: 20.0000 mg | ORAL_TABLET | Freq: Every day | ORAL | 3 refills | Status: DC

## 2017-06-12 MED ORDER — TRAZODONE HCL 50 MG PO TABS: 50.0000 mg | ORAL_TABLET | Freq: Every evening | ORAL | 2 refills | Status: DC | PRN

## 2017-06-12 MED ORDER — GABAPENTIN 300 MG PO CAPS: 300.0000 mg | ORAL_CAPSULE | Freq: Two times a day (BID) | ORAL | 2 refills | Status: DC

## 2017-06-12 MED ORDER — CLONIDINE HCL 0.1 MG PO TABS: 0.1000 mg | ORAL_TABLET | Freq: Every day | ORAL | 3 refills | Status: DC

## 2017-06-12 MED ORDER — CYCLOBENZAPRINE HCL 10 MG PO TABS: ORAL_TABLET | ORAL | 1 refills | Status: DC

## 2017-06-12 MED ORDER — MELOXICAM 7.5 MG PO TABS: 7.5000 mg | ORAL_TABLET | Freq: Every day | ORAL | 3 refills | Status: DC

## 2017-06-12 MED ORDER — CYCLOBENZAPRINE HCL 10 MG PO TABS: ORAL_TABLET | ORAL | 3 refills | Status: DC

## 2017-06-12 MED FILL — GABAPENTIN 300 MG CAPSULE: 300 | 30 days supply | Qty: 60 | Fill #0

## 2017-06-12 MED FILL — traZODone HCL 50 MG TABS: 50 | 30 days supply | Qty: 30 | Fill #0

## 2017-06-12 MED FILL — ACETAMINOPHEN/COD #3 TABLET: 300-30 | 30 days supply | Qty: 60 | Fill #0

## 2017-06-12 NOTE — Patient Instructions (Signed)
Edema Edema is when you have too much fluid in your body or under your skin. Edema may make your legs, feet, and ankles swell up. Swelling is also common in looser tissues, like around your eyes. This is a common condition. It gets more common as you get older. There are many possible causes of edema. Eating too much salt (sodium) and being on your feet or sitting for a long time can cause edema in your legs, feet, and ankles. Hot weather may make edema worse. Edema is usually painless. Your skin may look swollen or shiny. Follow these instructions at home:  Keep the swollen body part raised (elevated) above the level of your heart when you are sitting or lying down.  Do not sit still or stand for a long time.  Do not wear tight clothes. Do not wear garters on your upper legs.  Exercise your legs. This can help the swelling go down.  Wear elastic bandages or support stockings as told by your doctor.  Eat a low-salt (low-sodium) diet to reduce fluid as told by your doctor.  Depending on the cause of your swelling, you may need to limit how much fluid you drink (fluid restriction).  Take over-the-counter and prescription medicines only as told by your doctor. Contact a doctor if:  Treatment is not working.  You have heart, liver, or kidney disease and have symptoms of edema.  You have sudden and unexplained weight gain. Get help right away if:  You have shortness of breath or chest pain.  You cannot breathe when you lie down.  You have pain, redness, or warmth in the swollen areas.  You have heart, liver, or kidney disease and get edema all of a sudden.  You have a fever and your symptoms get worse all of a sudden. Summary  Edema is when you have too much fluid in your body or under your skin.  Edema may make your legs, feet, and ankles swell up. Swelling is also common in looser tissues, like around your eyes.  Raise (elevate) the swollen body part above the level of your  heart when you are sitting or lying down.  Follow your doctor's instructions about diet and how much fluid you can drink (fluid restriction). This information is not intended to replace advice given to you by your health care provider. Make sure you discuss any questions you have with your health care provider. Document Released: 02/20/2008 Document Revised: 09/21/2016 Document Reviewed: 09/21/2016 Elsevier Interactive Patient Education  2017 [employer]  

## 2017-06-12 NOTE — Progress Notes (Signed)
Subjective:  Patient ID: [name], female    DOB: 01/14/1971  Age: 46 y.o. MRN: [id number]  CC: Leg Swelling   HPI [name] is a 46 year old female with a history of asthma, hypertension, depression, chronic low back pain secondary to spinal stenosis, degenerative disc disease who comes in Complaining of bilateral lower extremity swelling.  She complains of four-day history of swelling in both lower extremities which occurred on the medial aspects of both legs and also thinks she has swelling in her lower back. She informs me that on pushing against her lower back she can feel the fluid go down to her legs. She denies shortness of breath, chest pains and of note she gained 10 pounds between 12/2016 and 04/2017.  She does have chronic low back pain which radiates to both lower extremities and affects her gait; she ambulates with a cane. Also has intermittent numbness in her legs. Denies loss of sphincteric function. She also has chronic neck pain with cervical radiculopathy She is not interested in getting epidural spinal injections for her back and remains on Tylenol 3 chronically.  Asthma has been stable with no exacerbations; her depression is controlled on current regimen and she has been compliant with her antihypertensive.  Past Medical History:  Diagnosis Date  . Allergy    pollen, pcn  . Asthma   . Depression   . Fibroids   . History of ovarian cyst   . Medical history non-contributory    sickle cell  . Menometrorrhagia   . 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 Sickle cell trait [facility])     Past Surgical History:  Procedure Laterality Date  . CESAREAN SECTION     all three pregnancies    Allergies  Allergen Reactions  . Bupropion Hcl Er (Sr) Hives and Other (See Comments)    The patient has an entire page of reactions that can be caused by the medication, but none that she has suffered.  . Fruit & Vegetable Daily [Nutritional Supplements] Other  (See Comments)    "all fruits causes bumps and weird feeling in her mouth"  . Penicillins Hives  . Pollen Extract-Tree Extract Other (See Comments)    Allergies  . Other Rash    "bugs" roaches      Outpatient Medications Prior to Visit  Medication Sig Dispense Refill  . acetaminophen-codeine (TYLENOL #3) 300-30 MG tablet TAKE 1 TABLET BY MOUTH EVERY 12 HOURS AS NEEDED FOR MODERATE PAIN 60 tablet 0  . ADVAIR DISKUS 500-50 MCG/DOSE AEPB INHALE 1 PUFF INTO THE LUNGS 2 (TWO) TIMES DAILY. 180 each 0  . albuterol (VENTOLIN HFA) 108 (90 Base) MCG/ACT inhaler Inhale 2 puffs into the lungs every 6 (six) hours as needed for wheezing or shortness of breath. 54 g 3  . busPIRone (BUSPAR) 10 MG tablet TAKE 1 TABLET BY MOUTH 2 TIMES DAILY 60 tablet 3  . lactulose (CHRONULAC) 10 GM/15ML solution Take 15 mLs (10 g total) by mouth 2 (two) times daily as needed for mild constipation. 946 mL 0  . triamcinolone cream (KENALOG) 0.1 % Apply 1 application topically 2 (two) times daily. 30 g 1  . cloNIDine (CATAPRES) 0.1 MG tablet Take 1 tablet (0.1 mg total) by mouth at bedtime. 30 tablet 3  . cyclobenzaprine (FLEXERIL) 10 MG tablet TAKE 1 TABLET BY MOUTH 2 TIMES DAILY AS NEEDED FOR MUSCLE SPASMS. 60 tablet 1  . FLUoxetine (PROZAC) 40 MG capsule Take 1  capsule (40 mg total) by mouth daily. 30 capsule 3  . FLUoxetine (PROZAC) 40 MG capsule TAKE 1 CAPSULE BY MOUTH DAILY. 30 capsule 3  . gabapentin (NEURONTIN) 300 MG capsule Take 1 capsule (300 mg total) by mouth 2 (two) times daily. 60 capsule 2  . levocetirizine (XYZAL) 5 MG tablet Take 1 tablet (5 mg total) by mouth every evening. 30 tablet 2  . meloxicam (MOBIC) 7.5 MG tablet TAKE 1 TABLET BY MOUTH DAILY. 30 tablet 1  . traZODone (DESYREL) 50 MG tablet Take 1 tablet (50 mg total) by mouth at bedtime as needed for sleep. 30 tablet 2  . tiZANidine (ZANAFLEX) 4 MG tablet Take 1 tablet (4 mg total) by mouth every 8 (eight) hours as needed for muscle spasms. (Patient  not taking: Reported on 04/29/2017) 90 tablet 2   No facility-administered medications prior to visit.     ROS Review of Systems  Constitutional: Negative for activity change, appetite change and fatigue.  HENT: Negative for congestion, sinus pressure and sore throat.   Eyes: Negative for visual disturbance.  Respiratory: Negative for cough, chest tightness, shortness of breath and wheezing.   Cardiovascular: Positive for leg swelling. Negative for chest pain and palpitations.  Gastrointestinal: Negative for abdominal distention, abdominal pain and constipation.  Endocrine: Negative for polydipsia.  Genitourinary: Negative for dysuria and frequency.  Musculoskeletal: Positive for back pain. Negative for arthralgias.  Skin: Negative for rash.  Neurological: Negative for tremors, light-headedness and numbness.  Hematological: Does not bruise/bleed easily.  Psychiatric/Behavioral: Negative for agitation and behavioral problems.    Objective:  BP 110/75   Pulse 69   Temp 98.1 F (36.7 C) (Oral)   Ht '5\' 1"'  (1.549 m)   Wt 183 lb (83 kg)   SpO2 100%   BMI 34.58 kg/m   BP/Weight 06/12/2017 04/29/2017 03/12/9484  Systolic BP 462 703 500  Diastolic BP 75 79 74  Wt. (Lbs) 183 183 180  BMI 34.58 34.58 34.01  Some encounter information is confidential and restricted. Go to Review Flowsheets activity to see all data.      Physical Exam  Constitutional: She is oriented to person, place, and time. She appears well-developed and well-nourished.  Cardiovascular: Normal rate, normal heart sounds and intact distal pulses.   No murmur heard. Pulmonary/Chest: Effort normal and breath sounds normal. She has no wheezes. She has no rales. She exhibits no tenderness.  Abdominal: Soft. Bowel sounds are normal. She exhibits no distension and no mass. There is no tenderness.  Musculoskeletal: She exhibits edema (1+ bilateral non pitting edema of both calves) and tenderness (tenderness across lumbar  spine; positive straight leg raise bilaterally).  Neurological: She is alert and oriented to person, place, and time.  Skin: Skin is warm and dry.  Psychiatric: She has a normal mood and affect.     Assessment & Plan:   1. Cervical disc disorder with radiculopathy of cervical region Stable - cyclobenzaprine (FLEXERIL) 10 MG tablet; TAKE 1 TABLET BY MOUTH 2 TIMES DAILY AS NEEDED FOR MUSCLE SPASMS.  Dispense: 60 tablet; Refill: 3  2. Spinal stenosis, lumbar region, with neurogenic claudication Unable to see a spine specialist due to lack of medical coverage Refuses referral for epidural spinal injections and is satisfied with taking Tylenol 3 We will refill Tylenol 3 once drug screen is obtained. - gabapentin (NEURONTIN) 300 MG capsule; Take 1 capsule (300 mg total) by mouth 2 (two) times daily.  Dispense: 60 capsule; Refill: 2 - Drug Screen 12+Alcohol+CRT,  Ur - meloxicam (MOBIC) 7.5 MG tablet; Take 1 tablet (7.5 mg total) by mouth daily.  Dispense: 30 tablet; Refill: 3 - cyclobenzaprine (FLEXERIL) 10 MG tablet; TAKE 1 TABLET BY MOUTH 2 TIMES DAILY AS NEEDED FOR MUSCLE SPASMS.  Dispense: 60 tablet; Refill: 3  3. Moderate episode of recurrent major depressive disorder (HCC) Stable - traZODone (DESYREL) 50 MG tablet; Take 1 tablet (50 mg total) by mouth at bedtime as needed for sleep.  Dispense: 30 tablet; Refill: 2 - FLUoxetine (PROZAC) 40 MG capsule; Take 1 capsule (40 mg total) by mouth daily.  Dispense: 30 capsule; Refill: 3  4. Other insomnia Controlled - cloNIDine (CATAPRES) 0.1 MG tablet; Take 1 tablet (0.1 mg total) by mouth at bedtime.  Dispense: 30 tablet; Refill: 3  5. Pedal edema Likely dependent Use compression stockings, elevate feet, low-sodium ? Secondary to weight gain-gained 10 pounds in 6 months between 12/2016 and 04/2017 Commenced low-dose Lasix We'll check potassium level in 2 weeks - CMP14+EGFR - Brain natriuretic peptide - furosemide (LASIX) 20 MG tablet; Take  1 tablet (20 mg total) by mouth daily.  Dispense: 30 tablet; Refill: 3 - Basic Metabolic Panel; Future  6. Mild intermittent asthma without complication No acute flares Continue Advair MDI   Meds ordered this encounter  Medications  . DISCONTD: cyclobenzaprine (FLEXERIL) 10 MG tablet    Sig: TAKE 1 TABLET BY MOUTH 2 TIMES DAILY AS NEEDED FOR MUSCLE SPASMS.    Dispense:  60 tablet    Refill:  1  . gabapentin (NEURONTIN) 300 MG capsule    Sig: Take 1 capsule (300 mg total) by mouth 2 (two) times daily.    Dispense:  60 capsule    Refill:  2  . traZODone (DESYREL) 50 MG tablet    Sig: Take 1 tablet (50 mg total) by mouth at bedtime as needed for sleep.    Dispense:  30 tablet    Refill:  2  . levocetirizine (XYZAL) 5 MG tablet    Sig: Take 1 tablet (5 mg total) by mouth every evening.    Dispense:  30 tablet    Refill:  2  . meloxicam (MOBIC) 7.5 MG tablet    Sig: Take 1 tablet (7.5 mg total) by mouth daily.    Dispense:  30 tablet    Refill:  3  . FLUoxetine (PROZAC) 40 MG capsule    Sig: Take 1 capsule (40 mg total) by mouth daily.    Dispense:  30 capsule    Refill:  3  . cyclobenzaprine (FLEXERIL) 10 MG tablet    Sig: TAKE 1 TABLET BY MOUTH 2 TIMES DAILY AS NEEDED FOR MUSCLE SPASMS.    Dispense:  60 tablet    Refill:  3  . cloNIDine (CATAPRES) 0.1 MG tablet    Sig: Take 1 tablet (0.1 mg total) by mouth at bedtime.    Dispense:  30 tablet    Refill:  3  . furosemide (LASIX) 20 MG tablet    Sig: Take 1 tablet (20 mg total) by mouth daily.    Dispense:  30 tablet    Refill:  3    Follow-up: Return in about 3 months (around 09/11/2017) for Follow-up on back pain.   Arnoldo Morale MD

## 2017-06-13 ENCOUNTER — Other Ambulatory Visit: Payer: Self-pay | Admitting: Family Medicine

## 2017-06-13 DIAGNOSIS — F329 Major depressive disorder, single episode, unspecified: Secondary | ICD-10-CM

## 2017-06-13 DIAGNOSIS — F32A Depression, unspecified: Secondary | ICD-10-CM

## 2017-06-13 DIAGNOSIS — M48062 Spinal stenosis, lumbar region with neurogenic claudication: Secondary | ICD-10-CM

## 2017-06-13 MED FILL — FLUoxetine HCL 40 MG CAPS: 40 | 30 days supply | Qty: 30 | Fill #0

## 2017-06-13 MED FILL — MELOXICAM 7.5 MG TABLET: 7.5 | 30 days supply | Qty: 30 | Fill #0

## 2017-06-13 MED FILL — FUROSEMIDE 20 MG TABLET: 20 | 30 days supply | Qty: 30 | Fill #0

## 2017-06-13 MED FILL — CYCLOBENZAPRINE 10 MG TAB: 10 | 30 days supply | Qty: 60 | Fill #0

## 2017-06-17 ENCOUNTER — Ambulatory Visit: Payer: Self-pay | Admitting: Internal Medicine

## 2017-06-18 LAB — CMP14+EGFR
A/G RATIO: 1.7 (ref 1.2–2.2)
ALK PHOS: 73 IU/L (ref 39–117)
ALT: 10 IU/L (ref 0–32)
AST: 20 IU/L (ref 0–40)
Albumin: 4.2 g/dL (ref 3.5–5.5)
BILIRUBIN TOTAL: 0.2 mg/dL (ref 0.0–1.2)
BUN/Creatinine Ratio: 5 — ABNORMAL LOW (ref 9–23)
BUN: 5 mg/dL — ABNORMAL LOW (ref 6–24)
CALCIUM: 8.8 mg/dL (ref 8.7–10.2)
CHLORIDE: 103 mmol/L (ref 96–106)
CO2: 24 mmol/L (ref 20–29)
Creatinine, Ser: 0.92 mg/dL (ref 0.57–1.00)
GFR calc non Af Amer: 74 mL/min/{1.73_m2} (ref >59)
GFR, EST AFRICAN AMERICAN: 86 mL/min/{1.73_m2} (ref >59)
GLUCOSE: 87 mg/dL (ref 65–99)
Globulin, Total: 2.5 g/dL (ref 1.5–4.5)
POTASSIUM: 3.7 mmol/L (ref 3.5–5.2)
Sodium: 139 mmol/L (ref 134–144)
TOTAL PROTEIN: 6.7 g/dL (ref 6.0–8.5)

## 2017-06-18 LAB — DRUG SCREEN 12+ALCOHOL+CRT, UR
AMPHETAMINES, URINE: NEGATIVE ng/mL
BARBITURATE: NEGATIVE ng/mL
BENZODIAZ UR QL: NEGATIVE ng/mL
CANNABINOIDS: NEGATIVE ng/mL
COCAINE (METABOLITE): NEGATIVE ng/mL
CREATININE, RANDOM U: 108.6 mg/dL (ref 20.0–300.0)
Ethanol, Urine: NEGATIVE %
METHADONE: NEGATIVE ng/mL
Meperidine: NEGATIVE ng/mL
OPIATE SCREEN URINE: POSITIVE ng/mL — AB
OXYCODONE+OXYMORPHONE UR QL SCN: NEGATIVE ng/mL
PHENCYCLIDINE: NEGATIVE ng/mL
Propoxyphene: NEGATIVE ng/mL
Tramadol: NEGATIVE ng/mL

## 2017-06-18 LAB — BRAIN NATRIURETIC PEPTIDE: BNP: 25.8 pg/mL (ref 0.0–100.0)

## 2017-06-20 ENCOUNTER — Telehealth: Payer: Self-pay

## 2017-06-20 NOTE — Telephone Encounter (Signed)
Pt was called and informed of lab results. 

## 2017-07-08 ENCOUNTER — Other Ambulatory Visit: Payer: Self-pay | Admitting: Family Medicine

## 2017-07-08 DIAGNOSIS — M48062 Spinal stenosis, lumbar region with neurogenic claudication: Secondary | ICD-10-CM

## 2017-07-08 MED FILL — ?CLONIDINE HCL 0.1 MG TABL: 0.1 | 30 days supply | Qty: 30 | Fill #0

## 2017-07-08 MED FILL — $VENTOLIN HFA 18G INHALER: 108 (90 BAS | 25 days supply | Qty: 18 | Fill #6

## 2017-07-08 MED FILL — busPIRone HCL 10 MG TABS: 10 | 30 days supply | Qty: 60 | Fill #2

## 2017-07-08 MED FILL — $ADVAIR 500/50MCG INHALER: 500-50 | 30 days supply | Qty: 60 | Fill #1

## 2017-07-08 MED FILL — traZODone HCL 50 MG TABS: 50 | 30 days supply | Qty: 30 | Fill #1

## 2017-07-09 ENCOUNTER — Other Ambulatory Visit: Payer: Self-pay | Admitting: Family Medicine

## 2017-07-09 DIAGNOSIS — M48062 Spinal stenosis, lumbar region with neurogenic claudication: Secondary | ICD-10-CM

## 2017-07-09 MED FILL — ?FUROSEMIDE 20 MG TABLET: 20 | 30 days supply | Qty: 30 | Fill #1

## 2017-07-09 MED FILL — ?CYCLOBENZAPRINE 10 MG TABL: 10 | 30 days supply | Qty: 60 | Fill #1

## 2017-07-09 MED FILL — ACETAMINOPHEN/COD #3 TABLET: 300-30 | 30 days supply | Qty: 60 | Fill #0

## 2017-07-12 MED FILL — MELOXICAM 7.5 MG TABLET: 7.5 | 30 days supply | Qty: 30 | Fill #1

## 2017-07-12 MED FILL — GABAPENTIN 300 MG CAPSULE: 300 | 30 days supply | Qty: 60 | Fill #1

## 2017-07-12 MED FILL — FLUoxetine HCL 40 MG CAPS: 40 | 30 days supply | Qty: 30 | Fill #3

## 2017-08-05 ENCOUNTER — Other Ambulatory Visit: Payer: Self-pay | Admitting: Family Medicine

## 2017-08-05 DIAGNOSIS — L308 Other specified dermatitis: Secondary | ICD-10-CM

## 2017-08-05 MED FILL — ?CLONIDINE HCL 0.1 MG TABL: 0.1 | 30 days supply | Qty: 30 | Fill #1

## 2017-08-05 MED FILL — traZODone HCL 50 MG TABS: 50 | 30 days supply | Qty: 30 | Fill #2

## 2017-08-05 MED FILL — ?CYCLOBENZAPRINE 10 MG TABL: 10 | 30 days supply | Qty: 60 | Fill #2

## 2017-08-05 MED FILL — $ADVAIR 500/50MCG INHALER: 500-50 | 30 days supply | Qty: 60 | Fill #2

## 2017-08-05 MED FILL — $VENTOLIN HFA 18G INHALER: 108 (90 BAS | 25 days supply | Qty: 18 | Fill #7

## 2017-08-05 MED FILL — ?FUROSEMIDE 20 MG TABLET: 20 | 30 days supply | Qty: 30 | Fill #2

## 2017-08-05 MED FILL — ACETAMINOPHEN/COD #3 TABLET: 300-30 | 30 days supply | Qty: 60 | Fill #1

## 2017-08-12 MED FILL — FLUoxetine HCL 40 MG CAPS: 40 | 30 days supply | Qty: 30 | Fill #1

## 2017-08-12 MED FILL — GABAPENTIN 300 MG CAPSULE: 300 | 30 days supply | Qty: 60 | Fill #2

## 2017-08-12 MED FILL — busPIRone HCL 10 MG TABS: 10 | 30 days supply | Qty: 60 | Fill #3

## 2017-08-12 MED FILL — MELOXICAM 7.5 MG TABLET: 7.5 | 30 days supply | Qty: 30 | Fill #2

## 2017-08-27 ENCOUNTER — Other Ambulatory Visit: Payer: Self-pay | Admitting: Family Medicine

## 2017-08-27 DIAGNOSIS — F331 Major depressive disorder, recurrent, moderate: Secondary | ICD-10-CM

## 2017-08-27 DIAGNOSIS — F419 Anxiety disorder, unspecified: Secondary | ICD-10-CM

## 2017-08-27 DIAGNOSIS — M48062 Spinal stenosis, lumbar region with neurogenic claudication: Secondary | ICD-10-CM

## 2017-08-28 MED FILL — $ADVAIR 500/50MCG INHALER: 500-50 | 30 days supply | Qty: 60 | Fill #3

## 2017-08-28 MED FILL — $VENTOLIN HFA 18G INHALER: 108 (90 BAS | 25 days supply | Qty: 18 | Fill #8

## 2017-09-02 ENCOUNTER — Other Ambulatory Visit: Payer: Self-pay | Admitting: Family Medicine

## 2017-09-02 DIAGNOSIS — F419 Anxiety disorder, unspecified: Secondary | ICD-10-CM

## 2017-09-02 DIAGNOSIS — F331 Major depressive disorder, recurrent, moderate: Secondary | ICD-10-CM

## 2017-09-02 DIAGNOSIS — L308 Other specified dermatitis: Secondary | ICD-10-CM

## 2017-09-02 DIAGNOSIS — M48062 Spinal stenosis, lumbar region with neurogenic claudication: Secondary | ICD-10-CM

## 2017-09-02 MED FILL — ?CLONIDINE HCL 0.1 MG TABL: 0.1 | 30 days supply | Qty: 30 | Fill #2

## 2017-09-02 MED FILL — ?TRIAMCINOLONE 0.1% CRM: 0.1 | 15 days supply | Qty: 30 | Fill #0

## 2017-09-02 MED FILL — ?FUROSEMIDE 20 MG TABLET: 20 | 30 days supply | Qty: 30 | Fill #3

## 2017-09-02 MED FILL — ACETAMINOPHEN/COD #3 TABLET: 300-30 | 15 days supply | Qty: 30 | Fill #0

## 2017-09-02 MED FILL — traZODone HCL 50 MG TABS: 50 | 30 days supply | Qty: 30 | Fill #0

## 2017-09-02 NOTE — Telephone Encounter (Signed)
Pt. Called requesting a refill on the following medications:   traZODone (DESYREL) 50 MG tablet  busPIRone (BUSPAR) 10 MG tablet  levocetirizine (XYZAL) 5 MG tablet  gabapentin (NEURONTIN) 300 MG capsule   Pt. Uses [facility]. Please f/u with pt.

## 2017-09-02 NOTE — Telephone Encounter (Signed)
Pt called requesting a refill on Tylenol # 3. Pt. Uses [place] pharmacy. Please f/u with pt.

## 2017-09-03 MED FILL — CYCLOBENZAPRINE 10 MG TAB: 10 | 30 days supply | Qty: 60 | Fill #0

## 2017-09-06 ENCOUNTER — Other Ambulatory Visit: Payer: Self-pay | Admitting: Family Medicine

## 2017-09-06 DIAGNOSIS — R600 Localized edema: Secondary | ICD-10-CM

## 2017-09-06 MED FILL — GABAPENTIN 300 MG CAPSULE: 300 | 30 days supply | Qty: 60 | Fill #0

## 2017-09-06 MED FILL — FLUoxetine HCL 40 MG CAPS: 40 | 30 days supply | Qty: 30 | Fill #2

## 2017-09-06 MED FILL — MELOXICAM 7.5 MG TABLET: 7.5 | 30 days supply | Qty: 30 | Fill #3

## 2017-09-06 MED FILL — busPIRone HCL 10 MG TABS: 10 | 30 days supply | Qty: 60 | Fill #0

## 2017-09-11 ENCOUNTER — Ambulatory Visit: Payer: Self-pay | Admitting: Family Medicine

## 2017-09-13 ENCOUNTER — Encounter: Payer: Self-pay | Admitting: Family Medicine

## 2017-09-13 ENCOUNTER — Ambulatory Visit: Payer: Self-pay | Attending: Family Medicine | Admitting: Family Medicine

## 2017-09-13 VITALS — BP 95/65 | HR 90 | Temp 97.7°F | Resp 18 | Ht 61.0 in | Wt 195.0 lb

## 2017-09-13 DIAGNOSIS — F419 Anxiety disorder, unspecified: Secondary | ICD-10-CM

## 2017-09-13 DIAGNOSIS — K5903 Drug induced constipation: Secondary | ICD-10-CM | POA: Insufficient documentation

## 2017-09-13 DIAGNOSIS — G4700 Insomnia, unspecified: Secondary | ICD-10-CM | POA: Insufficient documentation

## 2017-09-13 DIAGNOSIS — T402X5A Adverse effect of other opioids, initial encounter: Secondary | ICD-10-CM | POA: Insufficient documentation

## 2017-09-13 DIAGNOSIS — M4802 Spinal stenosis, cervical region: Secondary | ICD-10-CM | POA: Insufficient documentation

## 2017-09-13 DIAGNOSIS — N951 Menopausal and female climacteric states: Secondary | ICD-10-CM | POA: Insufficient documentation

## 2017-09-13 DIAGNOSIS — M5010 Cervical disc disorder with radiculopathy, unspecified cervical region: Secondary | ICD-10-CM

## 2017-09-13 DIAGNOSIS — L304 Erythema intertrigo: Secondary | ICD-10-CM

## 2017-09-13 DIAGNOSIS — G4709 Other insomnia: Secondary | ICD-10-CM

## 2017-09-13 DIAGNOSIS — I10 Essential (primary) hypertension: Secondary | ICD-10-CM | POA: Insufficient documentation

## 2017-09-13 DIAGNOSIS — K5909 Other constipation: Secondary | ICD-10-CM

## 2017-09-13 DIAGNOSIS — Z79899 Other long term (current) drug therapy: Secondary | ICD-10-CM | POA: Insufficient documentation

## 2017-09-13 DIAGNOSIS — M48062 Spinal stenosis, lumbar region with neurogenic claudication: Secondary | ICD-10-CM

## 2017-09-13 DIAGNOSIS — J4530 Mild persistent asthma, uncomplicated: Secondary | ICD-10-CM

## 2017-09-13 DIAGNOSIS — G8929 Other chronic pain: Secondary | ICD-10-CM | POA: Insufficient documentation

## 2017-09-13 DIAGNOSIS — F331 Major depressive disorder, recurrent, moderate: Secondary | ICD-10-CM

## 2017-09-13 DIAGNOSIS — D573 Sickle-cell trait: Secondary | ICD-10-CM | POA: Insufficient documentation

## 2017-09-13 DIAGNOSIS — R232 Flushing: Secondary | ICD-10-CM

## 2017-09-13 DIAGNOSIS — Z880 Allergy status to penicillin: Secondary | ICD-10-CM | POA: Insufficient documentation

## 2017-09-13 DIAGNOSIS — R600 Localized edema: Secondary | ICD-10-CM

## 2017-09-13 MED ORDER — FUROSEMIDE 20 MG PO TABS: 20.0000 mg | ORAL_TABLET | Freq: Every day | ORAL | 1 refills | Status: DC

## 2017-09-13 MED ORDER — GABAPENTIN 300 MG PO CAPS: ORAL_CAPSULE | ORAL | 3 refills | Status: DC

## 2017-09-13 MED ORDER — ACETAMINOPHEN-CODEINE #3 300-30 MG PO TABS: ORAL_TABLET | ORAL | 2 refills | Status: DC

## 2017-09-13 MED ORDER — FLUOXETINE HCL 40 MG PO CAPS: 40.0000 mg | ORAL_CAPSULE | Freq: Every day | ORAL | 3 refills | Status: DC

## 2017-09-13 MED ORDER — CYCLOBENZAPRINE HCL 10 MG PO TABS: ORAL_TABLET | ORAL | 3 refills | Status: DC

## 2017-09-13 MED ORDER — ALBUTEROL SULFATE HFA 108 (90 BASE) MCG/ACT IN AERS: 2.0000 | INHALATION_SPRAY | Freq: Four times a day (QID) | RESPIRATORY_TRACT | 3 refills | Status: DC | PRN

## 2017-09-13 MED ORDER — CLONIDINE HCL 0.2 MG PO TABS: 0.2000 mg | ORAL_TABLET | Freq: Every day | ORAL | 3 refills | Status: DC

## 2017-09-13 MED ORDER — BUSPIRONE HCL 10 MG PO TABS: 10.0000 mg | ORAL_TABLET | Freq: Two times a day (BID) | ORAL | 3 refills | Status: DC

## 2017-09-13 MED ORDER — LEVOCETIRIZINE DIHYDROCHLORIDE 5 MG PO TABS: 5.0000 mg | ORAL_TABLET | Freq: Every evening | ORAL | 2 refills | Status: DC

## 2017-09-13 MED ORDER — LACTULOSE 10 GM/15ML PO SOLN: 10.0000 g | Freq: Two times a day (BID) | ORAL | 1 refills | Status: DC | PRN

## 2017-09-13 MED ORDER — TRAZODONE HCL 50 MG PO TABS: 50.0000 mg | ORAL_TABLET | Freq: Every evening | ORAL | 3 refills | Status: DC | PRN

## 2017-09-13 MED ORDER — MELOXICAM 7.5 MG PO TABS: 7.5000 mg | ORAL_TABLET | Freq: Every day | ORAL | 3 refills | Status: DC

## 2017-09-13 MED ORDER — FLUTICASONE-SALMETEROL 500-50 MCG/DOSE IN AEPB: 1.0000 | INHALATION_SPRAY | Freq: Two times a day (BID) | RESPIRATORY_TRACT | 1 refills | Status: DC

## 2017-09-13 MED ORDER — CLOTRIMAZOLE 1 % EX CREA: 1.0000 "application " | TOPICAL_CREAM | Freq: Two times a day (BID) | CUTANEOUS | 1 refills | Status: DC

## 2017-09-13 MED FILL — LACTULOSE 10 GM/15 ML SOLUT: 10 | 30 days supply | Qty: 946 | Fill #0

## 2017-09-13 NOTE — Patient Instructions (Signed)
Intertrigo Intertrigo is skin irritation (inflammation) that happens in warm, moist areas of the body. The irritation can cause a rash and make skin raw and itchy. The rash is usually pink or red. It happens mostly between folds of skin or where skin rubs together, such as:  Toes.  Armpits.  Groin.  Belly.  Breasts.  Buttocks.  This condition is not passed from person to person (is not contagious). Follow these instructions at home:  Keep the affected area clean and dry.  Do not scratch your skin.  Stay cool as much as possible. Use an air conditioner or fan, if you can.  Apply over-the-counter and prescription medicines only as told by your doctor.  If you were prescribed an antibiotic medicine, use it as told by your doctor. Do not stop using the antibiotic even if your condition starts to get better.  Keep all follow-up visits as told by your doctor. This is important. How is this prevented?  Stay at a healthy weight.  Keep your feet dry. This is very important if you have diabetes. Wear cotton or wool socks.  Take care of and protect the skin in your groin and butt area as told by your doctor.  Do not wear tight clothes. Wear clothes that: ? Are loose. ? Take away moisture from your body. ? Are made of cotton.  Wear a bra that gives good support, if needed.  Shower and dry yourself fully after being active.  Keep your blood sugar under control if you have diabetes. Contact a doctor if:  Your symptoms do not get better with treatment.  Your symptoms get worse or they spread.  You notice more redness and warmth.  You have a fever. This information is not intended to replace advice given to you by your health care provider. Make sure you discuss any questions you have with your health care provider. Document Released: 10/06/2010 Document Revised: 02/09/2016 Document Reviewed: 03/07/2015 Elsevier Interactive Patient Education  2018 [employer]vier Inc.  

## 2017-09-13 NOTE — Progress Notes (Signed)
Subjective:  Patient ID: [name], female    DOB: [birth year]  Age: 47 y.o. MRN: [id number]  CC: Follow-up   HPI [name] is a 46 year old female with a history of asthma, hypertension, depression, chronic low back pain secondary to spinal stenosis, degenerative disc disease of the thoracic spine, cervical stenosis who presents today for a follow-up visit.  She suffers from chronic low back pain for which she remains on chronic Tylenol No. 3.  She has been unable to see a spine surgeon due to lack of medical coverage and declined epidural spinal injections in the past. She endorses numbness in both lower extremities and ambulates with a cane.  Denies loss of sphincteric functions and has had  falls in the remote past.  Asthma has been controlled on her current medications and she denies any flares. She tolerates her antihypertensive with no complaints of adverse effects and her depression and anxiety are well controlled. She complains her hot flashes are not controlled on her current dose of Coumadin and is requesting an increase in the dose.  She also complains of a pruritic rash in between both thighs and her suprapubic area.  Past Medical History:  Diagnosis Date  . Allergy    pollen, pcn  . Asthma   . Depression   . Fibroids   . History of ovarian cyst   . Medical history non-contributory    sickle cell  . Menometrorrhagia   . 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 Sickle cell trait [facility])     Past Surgical History:  Procedure Laterality Date  . CESAREAN SECTION     all three pregnancies    Allergies  Allergen Reactions  . Bupropion Hcl Er (Sr) Hives and Other (See Comments)    The patient has an entire page of reactions that can be caused by the medication, but none that she has suffered.  . Fruit & Vegetable Daily [Nutritional Supplements] Other (See Comments)    "all fruits causes bumps and weird feeling in her mouth"  . Penicillins Hives  .  Pollen Extract-Tree Extract Other (See Comments)    Allergies  . Other Rash    "bugs" roaches     Outpatient Medications Prior to Visit  Medication Sig Dispense Refill  . triamcinolone cream (KENALOG) 0.1 % APPLY 1 APPLICATION TOPICALLY 2 (TWO) TIMES DAILY. 30 g 0  . triamcinolone cream (KENALOG) 0.1 % APPLY 1 APPLICATION TOPICALLY 2 (TWO) TIMES DAILY. 30 g 1  . ADVAIR DISKUS 500-50 MCG/DOSE AEPB INHALE 1 PUFF INTO THE LUNGS 2 (TWO) TIMES DAILY. 180 each 0  . albuterol (VENTOLIN HFA) 108 (90 Base) MCG/ACT inhaler Inhale 2 puffs into the lungs every 6 (six) hours as needed for wheezing or shortness of breath. 54 g 3  . busPIRone (BUSPAR) 10 MG tablet TAKE 1 TABLET BY MOUTH 2 TIMES DAILY 60 tablet 0  . cloNIDine (CATAPRES) 0.1 MG tablet Take 1 tablet (0.1 mg total) by mouth at bedtime. 30 tablet 3  . cyclobenzaprine (FLEXERIL) 10 MG tablet TAKE 1 TABLET BY MOUTH 2 TIMES DAILY AS NEEDED FOR MUSCLE SPASMS. 60 tablet 3  . FLUoxetine (PROZAC) 40 MG capsule Take 1 capsule (40 mg total) by mouth daily. 30 capsule 3  . furosemide (LASIX) 20 MG tablet Take 1 tablet (20 mg total) by mouth daily. 30 tablet 3  . gabapentin (NEURONTIN) 300 MG capsule TAKE 1 CAPSULE BY MOUTH 2 TIMES DAILY. 60 capsule 0  .  lactulose (CHRONULAC) 10 GM/15ML solution Take 15 mLs (10 g total) by mouth 2 (two) times daily as needed for mild constipation. 946 mL 0  . levocetirizine (XYZAL) 5 MG tablet Take 1 tablet (5 mg total) by mouth every evening. 30 tablet 2  . meloxicam (MOBIC) 7.5 MG tablet Take 1 tablet (7.5 mg total) by mouth daily. 30 tablet 3  . traZODone (DESYREL) 50 MG tablet TAKE 1 TABLET BY MOUTH AT BEDTIME AS NEEDED FOR SLEEP. 30 tablet 2  . acetaminophen-codeine (TYLENOL #3) 300-30 MG tablet TAKE 1 TABLET BY MOUTH EVERY 12 HOURS AS NEEDED FOR MODERATE PAIN. 30 tablet 0   No facility-administered medications prior to visit.     ROS Review of Systems  Constitutional: Negative for activity change, appetite  change and fatigue.  HENT: Negative for congestion, sinus pressure and sore throat.   Eyes: Negative for visual disturbance.  Respiratory: Negative for cough, chest tightness, shortness of breath and wheezing.   Cardiovascular: Negative for chest pain and palpitations.  Gastrointestinal: Negative for abdominal distention, abdominal pain and constipation.  Endocrine: Negative for polydipsia.  Genitourinary: Negative for dysuria and frequency.  Musculoskeletal: Positive for back pain. Negative for arthralgias.  Skin: Positive for rash.  Neurological: Positive for numbness. Negative for tremors and light-headedness.  Hematological: Does not bruise/bleed easily.  Psychiatric/Behavioral: Negative for agitation and behavioral problems.    Objective:  BP 95/65 (BP Location: Right Arm, Patient Position: Sitting, Cuff Size: Normal)   Pulse 90   Temp 97.7 F (36.5 C) (Oral)   Resp 18   Ht 5\' 1"  (1.549 m)   Wt 195 lb (88.5 kg)   SpO2 97%   BMI 36.84 kg/m   BP/Weight 09/13/2017 06/12/2017 3/73/4287  Systolic BP 95 681 157  Diastolic BP 65 75 79  Wt. (Lbs) 195 183 183  BMI 36.84 34.58 34.58  Some encounter information is confidential and restricted. Go to Review Flowsheets activity to see all data.      Physical Exam  Constitutional: She is oriented to person, place, and time. She appears well-developed and well-nourished.  Cardiovascular: Normal rate, normal heart sounds and intact distal pulses.  No murmur heard. Pulmonary/Chest: Effort normal and breath sounds normal. She has no wheezes. She has no rales. She exhibits no tenderness.  Abdominal: Soft. Bowel sounds are normal. She exhibits no distension and no mass. There is no tenderness.  Musculoskeletal: Normal range of motion.  TTP lumbar spine Positive straight leg raise bilaterally  Neurological: She is alert and oriented to person, place, and time. She displays normal reflexes.  Skin:  Intertriginous rash in abdominal fold,  crural fold bilaterally  Psychiatric: She has a normal mood and affect.    CMP Latest Ref Rng & Units 06/12/2017 10/09/2016 08/02/2016  Glucose 65 - 99 mg/dL 87 74 89  BUN 6 - 24 mg/dL 5(L) 7 5(L)  Creatinine 0.57 - 1.00 mg/dL 0.92 0.67 0.80  Sodium 134 - 144 mmol/L 139 140 142  Potassium 3.5 - 5.2 mmol/L 3.7 4.0 3.4(L)  Chloride 96 - 106 mmol/L 103 106 103  CO2 20 - 29 mmol/L 24 25 -  Calcium 8.7 - 10.2 mg/dL 8.8 8.8 -  Total Protein 6.0 - 8.5 g/dL 6.7 - -  Total Bilirubin 0.0 - 1.2 mg/dL 0.2 - -  Alkaline Phos 39 - 117 IU/L 73 - -  AST 0 - 40 IU/L 20 - -  ALT 0 - 32 IU/L 10 - -    Lipid Panel  Component Value Date/Time   CHOL 148 05/15/2016 0949   TRIG 65 05/15/2016 0949   HDL 50 05/15/2016 0949   CHOLHDL 3.0 05/15/2016 0949   VLDL 13 05/15/2016 0949   LDLCALC 85 05/15/2016 0949    Assessment & Plan:   1. Spinal stenosis, lumbar region, with neurogenic claudication We will refer for possible epidural spinal injections She has a controlled substances agreement in her chart Last urine drug screen was in keeping with treatment regimen - acetaminophen-codeine (TYLENOL #3) 300-30 MG tablet; TAKE 1 TABLET BY MOUTH EVERY 12 HOURS AS NEEDED FOR MODERATE PAIN.  Dispense: 60 tablet; Refill: 2 - meloxicam (MOBIC) 7.5 MG tablet; Take 1 tablet (7.5 mg total) by mouth daily.  Dispense: 30 tablet; Refill: 3 - gabapentin (NEURONTIN) 300 MG capsule; TAKE 1 CAPSULE BY MOUTH 2 TIMES DAILY.  Dispense: 60 capsule; Refill: 3 - cyclobenzaprine (FLEXERIL) 10 MG tablet; TAKE 1 TABLET BY MOUTH 2 TIMES DAILY AS NEEDED FOR MUSCLE SPASMS.  Dispense: 60 tablet; Refill: 3 - Ambulatory referral to Spine Surgery  2. Moderate episode of recurrent major depressive disorder (HCC) Controlled - FLUoxetine (PROZAC) 40 MG capsule; Take 1 capsule (40 mg total) by mouth daily.  Dispense: 30 capsule; Refill: 3  3. 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  Dispense: 946 mL; Refill: 1  4. Pedal edema Advised to use Lasix only as needed Low-sodium diet Elevate feet - furosemide (LASIX) 20 MG tablet; Take 1 tablet (20 mg total) by mouth daily. As needed for pedal edema  Dispense: 30 tablet; Refill: 1  5. Cervical disc disorder with radiculopathy of cervical region Apply heat - cyclobenzaprine (FLEXERIL) 10 MG tablet; TAKE 1 TABLET BY MOUTH 2 TIMES DAILY AS NEEDED FOR MUSCLE SPASMS.  Dispense: 60 tablet; Refill: 3  6. Other insomnia Controlled - traZODone (DESYREL) 50 MG tablet; Take 1 tablet (50 mg total) by mouth at bedtime as needed. for sleep  Dispense: 30 tablet; Refill: 3  7. Anxiety Stable - busPIRone (BUSPAR) 10 MG tablet; Take 1 tablet (10 mg total) by mouth 2 (two) times daily.  Dispense: 60 tablet; Refill: 3  8. Mild persistent asthma without complication No acute exacerbation - Fluticasone-Salmeterol (ADVAIR DISKUS) 500-50 MCG/DOSE AEPB; Inhale 1 puff into the lungs 2 (two) times daily.  Dispense: 180 each; Refill: 1 - albuterol (VENTOLIN HFA) 108 (90 Base) MCG/ACT inhaler; Inhale 2 puffs into the lungs every 6 (six) hours as needed for wheezing or shortness of breath.  Dispense: 54 g; Refill: 3  9. Intertrigo Discussed weight loss, with use of loose clothing  - clotrimazole (LOTRIMIN) 1 % cream; Apply 1 application topically 2 (two) times daily.  Dispense: 45 g; Refill: 1  10. Hot flashes Uncontrolled Increase dose of clonidine - cloNIDine (CATAPRES) 0.2 MG tablet; Take 1 tablet (0.2 mg total) by mouth at bedtime.  Dispense: 30 tablet; Refill: 3   Meds ordered this encounter  Medications  . DISCONTD: acetaminophen-codeine (TYLENOL #3) 300-30 MG tablet    Sig: TAKE 1 TABLET BY MOUTH EVERY 12 HOURS AS NEEDED FOR MODERATE PAIN.    Dispense:  60 tablet    Refill:  2  . acetaminophen-codeine (TYLENOL #3) 300-30 MG  tablet    Sig: TAKE 1 TABLET BY MOUTH EVERY 12 HOURS AS NEEDED FOR MODERATE PAIN.    Dispense:  60 tablet  Refill:  2  . traZODone (DESYREL) 50 MG tablet    Sig: Take 1 tablet (50 mg total) by mouth at bedtime as needed. for sleep    Dispense:  30 tablet    Refill:  3  . meloxicam (MOBIC) 7.5 MG tablet    Sig: Take 1 tablet (7.5 mg total) by mouth daily.    Dispense:  30 tablet    Refill:  3  . levocetirizine (XYZAL) 5 MG tablet    Sig: Take 1 tablet (5 mg total) by mouth every evening.    Dispense:  30 tablet    Refill:  2  . lactulose (CHRONULAC) 10 GM/15ML solution    Sig: Take 15 mLs (10 g total) by mouth 2 (two) times daily as needed for mild constipation.    Dispense:  946 mL    Refill:  1  . gabapentin (NEURONTIN) 300 MG capsule    Sig: TAKE 1 CAPSULE BY MOUTH 2 TIMES DAILY.    Dispense:  60 capsule    Refill:  3  . furosemide (LASIX) 20 MG tablet    Sig: Take 1 tablet (20 mg total) by mouth daily. As needed for pedal edema    Dispense:  30 tablet    Refill:  1  . FLUoxetine (PROZAC) 40 MG capsule    Sig: Take 1 capsule (40 mg total) by mouth daily.    Dispense:  30 capsule    Refill:  3  . cyclobenzaprine (FLEXERIL) 10 MG tablet    Sig: TAKE 1 TABLET BY MOUTH 2 TIMES DAILY AS NEEDED FOR MUSCLE SPASMS.    Dispense:  60 tablet    Refill:  3  . cloNIDine (CATAPRES) 0.2 MG tablet    Sig: Take 1 tablet (0.2 mg total) by mouth at bedtime.    Dispense:  30 tablet    Refill:  3    Discontinue previous dose  . busPIRone (BUSPAR) 10 MG tablet    Sig: Take 1 tablet (10 mg total) by mouth 2 (two) times daily.    Dispense:  60 tablet    Refill:  3  . Fluticasone-Salmeterol (ADVAIR DISKUS) 500-50 MCG/DOSE AEPB    Sig: Inhale 1 puff into the lungs 2 (two) times daily.    Dispense:  180 each    Refill:  1  . albuterol (VENTOLIN HFA) 108 (90 Base) MCG/ACT inhaler    Sig: Inhale 2 puffs into the lungs every 6 (six) hours as needed for wheezing or shortness of breath.     Dispense:  54 g    Refill:  3  . clotrimazole (LOTRIMIN) 1 % cream    Sig: Apply 1 application topically 2 (two) times daily.    Dispense:  45 g    Refill:  1    Follow-up: Return in about 3 months (around 12/12/2017) for Follow-up of chronic medical conditions.   Arnoldo Morale MD

## 2017-09-16 MED FILL — ACETAMINOPHEN/COD #3 TABLET: 300-30 | 30 days supply | Qty: 60 | Fill #0

## 2017-10-02 ENCOUNTER — Other Ambulatory Visit: Payer: Self-pay

## 2017-10-02 DIAGNOSIS — J4530 Mild persistent asthma, uncomplicated: Secondary | ICD-10-CM

## 2017-10-02 MED ORDER — FLUTICASONE-SALMETEROL 500-50 MCG/DOSE IN AEPB: 1.0000 | INHALATION_SPRAY | Freq: Two times a day (BID) | RESPIRATORY_TRACT | 3 refills | Status: DC

## 2017-10-02 MED ORDER — ALBUTEROL SULFATE HFA 108 (90 BASE) MCG/ACT IN AERS: 2.0000 | INHALATION_SPRAY | Freq: Four times a day (QID) | RESPIRATORY_TRACT | 3 refills | Status: DC | PRN

## 2017-10-10 ENCOUNTER — Other Ambulatory Visit: Payer: Self-pay | Admitting: Family Medicine

## 2017-10-10 DIAGNOSIS — F419 Anxiety disorder, unspecified: Secondary | ICD-10-CM

## 2017-10-10 MED FILL — MELOXICAM 7.5 MG TABLET: 7.5 | 30 days supply | Qty: 30 | Fill #0

## 2017-10-10 MED FILL — traZODone HCL 50 MG TABS: 50 | 30 days supply | Qty: 30 | Fill #1

## 2017-10-10 MED FILL — ?CLONIDINE HCL 0.1 MG TABL: 0.1 | 30 days supply | Qty: 30 | Fill #3

## 2017-10-10 MED FILL — CYCLOBENZAPRINE 10 MG TAB: 10 | 30 days supply | Qty: 60 | Fill #3

## 2017-10-10 MED FILL — FLUoxetine HCL 40 MG CAPS: 40 | 30 days supply | Qty: 30 | Fill #3

## 2017-10-15 MED FILL — $VENTOLIN HFA 18G INHALER: 108 (90 BAS | 25 days supply | Qty: 18 | Fill #9

## 2017-10-15 MED FILL — ACETAMINOPHEN/COD #3 TABLET: 300-30 | 30 days supply | Qty: 60 | Fill #1

## 2017-10-15 MED FILL — GABAPENTIN 300 MG CAPSULE: 300 | 30 days supply | Qty: 60 | Fill #0

## 2017-10-15 MED FILL — busPIRone HCL 10 MG TABS: 10 | 30 days supply | Qty: 60 | Fill #0

## 2017-10-15 MED FILL — ?FUROSEMIDE 20 MG TABLET: 20 | 30 days supply | Qty: 30 | Fill #0

## 2017-10-15 MED FILL — $ADVAIR 500/50MCG INHALER: 500-50 | 30 days supply | Qty: 60 | Fill #4

## 2017-11-11 ENCOUNTER — Other Ambulatory Visit: Payer: Self-pay | Admitting: Family Medicine

## 2017-11-11 DIAGNOSIS — G4709 Other insomnia: Secondary | ICD-10-CM

## 2017-11-11 MED FILL — ACETAMINOPHEN/COD #3 TABLET: 300-30 | 30 days supply | Qty: 60 | Fill #2

## 2017-11-11 MED FILL — $ADVAIR 500/50MCG INHALER: 500-50 | 30 days supply | Qty: 60 | Fill #5

## 2017-11-11 MED FILL — $VENTOLIN HFA 18G INHALER: 108 (90 BAS | 25 days supply | Qty: 18 | Fill #10

## 2017-11-11 MED FILL — busPIRone HCL 10 MG TABS: 10 | 30 days supply | Qty: 60 | Fill #1

## 2017-11-11 MED FILL — FLUoxetine HCL 40 MG CAPS: 40 | 30 days supply | Qty: 30 | Fill #0

## 2017-11-11 MED FILL — traZODone HCL 50 MG TABS: 50 | 30 days supply | Qty: 30 | Fill #2

## 2017-11-11 MED FILL — GABAPENTIN 300 MG CAPSULE: 300 | 30 days supply | Qty: 60 | Fill #1

## 2017-11-11 MED FILL — ?FUROSEMIDE 20 MG TABLET: 20 | 30 days supply | Qty: 30 | Fill #1

## 2017-11-12 ENCOUNTER — Other Ambulatory Visit: Payer: Self-pay | Admitting: Family Medicine

## 2017-11-12 DIAGNOSIS — G4709 Other insomnia: Secondary | ICD-10-CM

## 2017-11-18 ENCOUNTER — Other Ambulatory Visit: Payer: Self-pay | Admitting: Family Medicine

## 2017-11-18 DIAGNOSIS — G4709 Other insomnia: Secondary | ICD-10-CM

## 2017-11-18 MED FILL — MELOXICAM 7.5 MG TABLET: 7.5 | 30 days supply | Qty: 30 | Fill #1

## 2017-11-18 NOTE — Telephone Encounter (Signed)
Patient called asking for a refill on clonidine 0.2 mg

## 2017-12-09 ENCOUNTER — Other Ambulatory Visit: Payer: Self-pay | Admitting: Family Medicine

## 2017-12-09 DIAGNOSIS — M48062 Spinal stenosis, lumbar region with neurogenic claudication: Secondary | ICD-10-CM

## 2017-12-09 DIAGNOSIS — G4709 Other insomnia: Secondary | ICD-10-CM

## 2017-12-09 DIAGNOSIS — R600 Localized edema: Secondary | ICD-10-CM

## 2017-12-09 MED FILL — $ADVAIR 500/50MCG INHALER: 500-50 | 30 days supply | Qty: 60 | Fill #6

## 2017-12-09 MED FILL — FLUoxetine HCL 40 MG CAPS: 40 | 30 days supply | Qty: 30 | Fill #1

## 2017-12-09 MED FILL — ?FUROSEMIDE 20 MG TABLET: 20 | 30 days supply | Qty: 30 | Fill #0

## 2017-12-09 MED FILL — MELOXICAM 7.5 MG TABLET: 7.5 | 30 days supply | Qty: 30 | Fill #2

## 2017-12-09 MED FILL — traZODone HCL 50 MG TABS: 50 | 30 days supply | Qty: 30 | Fill #0

## 2017-12-09 MED FILL — CYCLOBENZAPRINE 10 MG TAB: 10 | 30 days supply | Qty: 60 | Fill #0

## 2017-12-09 MED FILL — $VENTOLIN HFA 18G INHALER: 108 (90 BAS | 25 days supply | Qty: 18 | Fill #11

## 2017-12-09 MED FILL — ?CLONIDINE HCL 0.1 MG TABL: 0.1 | 30 days supply | Qty: 30 | Fill #0

## 2017-12-09 MED FILL — GABAPENTIN 300 MG CAPSULE: 300 | 30 days supply | Qty: 60 | Fill #2

## 2017-12-12 ENCOUNTER — Ambulatory Visit: Payer: Self-pay | Attending: Family Medicine | Admitting: Family Medicine

## 2017-12-12 ENCOUNTER — Encounter: Payer: Self-pay | Admitting: Family Medicine

## 2017-12-12 VITALS — BP 103/71 | HR 77 | Temp 97.9°F | Ht 61.0 in | Wt 206.6 lb

## 2017-12-12 DIAGNOSIS — G8929 Other chronic pain: Secondary | ICD-10-CM | POA: Insufficient documentation

## 2017-12-12 DIAGNOSIS — Z79899 Other long term (current) drug therapy: Secondary | ICD-10-CM | POA: Insufficient documentation

## 2017-12-12 DIAGNOSIS — K5909 Other constipation: Secondary | ICD-10-CM | POA: Insufficient documentation

## 2017-12-12 DIAGNOSIS — E669 Obesity, unspecified: Secondary | ICD-10-CM | POA: Insufficient documentation

## 2017-12-12 DIAGNOSIS — R232 Flushing: Secondary | ICD-10-CM

## 2017-12-12 DIAGNOSIS — R600 Localized edema: Secondary | ICD-10-CM | POA: Insufficient documentation

## 2017-12-12 DIAGNOSIS — D573 Sickle-cell trait: Secondary | ICD-10-CM | POA: Insufficient documentation

## 2017-12-12 DIAGNOSIS — Z6839 Body mass index (BMI) 39.0-39.9, adult: Secondary | ICD-10-CM | POA: Insufficient documentation

## 2017-12-12 DIAGNOSIS — N951 Menopausal and female climacteric states: Secondary | ICD-10-CM | POA: Insufficient documentation

## 2017-12-12 DIAGNOSIS — G4709 Other insomnia: Secondary | ICD-10-CM

## 2017-12-12 DIAGNOSIS — J4530 Mild persistent asthma, uncomplicated: Secondary | ICD-10-CM | POA: Insufficient documentation

## 2017-12-12 DIAGNOSIS — I10 Essential (primary) hypertension: Secondary | ICD-10-CM | POA: Insufficient documentation

## 2017-12-12 DIAGNOSIS — F331 Major depressive disorder, recurrent, moderate: Secondary | ICD-10-CM | POA: Insufficient documentation

## 2017-12-12 DIAGNOSIS — M48062 Spinal stenosis, lumbar region with neurogenic claudication: Secondary | ICD-10-CM | POA: Insufficient documentation

## 2017-12-12 DIAGNOSIS — G4700 Insomnia, unspecified: Secondary | ICD-10-CM | POA: Insufficient documentation

## 2017-12-12 DIAGNOSIS — M5010 Cervical disc disorder with radiculopathy, unspecified cervical region: Secondary | ICD-10-CM | POA: Insufficient documentation

## 2017-12-12 DIAGNOSIS — F419 Anxiety disorder, unspecified: Secondary | ICD-10-CM | POA: Insufficient documentation

## 2017-12-12 DIAGNOSIS — Z131 Encounter for screening for diabetes mellitus: Secondary | ICD-10-CM

## 2017-12-12 DIAGNOSIS — Z880 Allergy status to penicillin: Secondary | ICD-10-CM | POA: Insufficient documentation

## 2017-12-12 LAB — POCT GLYCOSYLATED HEMOGLOBIN (HGB A1C): HEMOGLOBIN A1C: 4.8

## 2017-12-12 MED ORDER — FLUOXETINE HCL 40 MG PO CAPS: 40.0000 mg | ORAL_CAPSULE | Freq: Every day | ORAL | 3 refills | Status: DC

## 2017-12-12 MED ORDER — ACETAMINOPHEN-CODEINE #3 300-30 MG PO TABS: ORAL_TABLET | ORAL | 2 refills | Status: DC

## 2017-12-12 MED ORDER — MELOXICAM 7.5 MG PO TABS: 7.5000 mg | ORAL_TABLET | Freq: Every day | ORAL | 3 refills | Status: DC

## 2017-12-12 MED ORDER — CYCLOBENZAPRINE HCL 10 MG PO TABS: ORAL_TABLET | ORAL | 3 refills | Status: DC

## 2017-12-12 MED ORDER — LACTULOSE 10 GM/15ML PO SOLN: 10.0000 g | Freq: Two times a day (BID) | ORAL | 1 refills | Status: DC | PRN

## 2017-12-12 MED ORDER — TRAZODONE HCL 50 MG PO TABS: 50.0000 mg | ORAL_TABLET | Freq: Every evening | ORAL | 3 refills | Status: DC | PRN

## 2017-12-12 MED ORDER — FUROSEMIDE 20 MG PO TABS: ORAL_TABLET | ORAL | 1 refills | Status: DC

## 2017-12-12 MED ORDER — LEVOCETIRIZINE DIHYDROCHLORIDE 5 MG PO TABS: 5.0000 mg | ORAL_TABLET | Freq: Every evening | ORAL | 2 refills | Status: DC

## 2017-12-12 MED ORDER — ALBUTEROL SULFATE HFA 108 (90 BASE) MCG/ACT IN AERS: 2.0000 | INHALATION_SPRAY | Freq: Four times a day (QID) | RESPIRATORY_TRACT | 3 refills | Status: DC | PRN

## 2017-12-12 MED ORDER — GABAPENTIN 300 MG PO CAPS: ORAL_CAPSULE | ORAL | 3 refills | Status: DC

## 2017-12-12 MED ORDER — FLUTICASONE-SALMETEROL 500-50 MCG/DOSE IN AEPB: 1.0000 | INHALATION_SPRAY | Freq: Two times a day (BID) | RESPIRATORY_TRACT | 3 refills | Status: DC

## 2017-12-12 MED ORDER — CLONIDINE HCL 0.2 MG PO TABS: 0.2000 mg | ORAL_TABLET | Freq: Every day | ORAL | 3 refills | Status: DC

## 2017-12-12 MED ORDER — BUSPIRONE HCL 10 MG PO TABS: 10.0000 mg | ORAL_TABLET | Freq: Two times a day (BID) | ORAL | 3 refills | Status: DC

## 2017-12-12 MED ORDER — BUSPIRONE HCL 7.5 MG PO TABS: 7.5000 mg | ORAL_TABLET | Freq: Two times a day (BID) | ORAL | 3 refills | Status: DC

## 2017-12-12 MED FILL — busPIRone HCL 7.5 MG TABS: 7.5 | 30 days supply | Qty: 60 | Fill #0

## 2017-12-12 MED FILL — ACETAMINOPHEN/COD #3 TABLET: 300-30 | 30 days supply | Qty: 60 | Fill #0

## 2017-12-12 MED FILL — ?LACTULOSE 10 GM/15 ML SOL: 10 | 31 days supply | Qty: 946 | Fill #0

## 2017-12-12 NOTE — Progress Notes (Signed)
Subjective:  Patient ID: [name], female    DOB: 05/21/1971  Age: 47 y.o. MRN: [id number]  CC: Asthma; Back Pain; and Leg Pain   HPI [name] is a 47 year old female with a history of asthma, hypertension, depression, chronic low back pain secondary to spinal stenosis, degenerative disc disease of the thoracic spine, cervical stenosis, hot flashes, chronic constipation who presents today for a follow-up visit.  Anxiety and depression are controlled on current regimen and she denies suicidal ideation or intents. Doing well on trazodone which she takes for insomnia and hot flashes controlled on clonidine. Denies recent asthma exacerbation and has not had to use her albuterol MDI frequently She suffers from chronic low back pain for which she remains on Flexeril, gabapentin and Tylenol 3 which she states makes her pain bearable to a 5/10.  Pain sometimes radiates down her lower extremities but today denies radiation.  I had referred her to a spine surgeon at her last visit 3 months ago for evaluation for possible dural spinal injections however today she is undecided about it and would like to continue on her current regimen.  Review of her chart indicates she has gained 23 pounds in the last 7 months and she endorses snacking and eating in front of the TV, does not exercise.  Past Medical History:  Diagnosis Date  . Allergy    pollen, pcn  . Asthma   . Depression   . Fibroids   . History of ovarian cyst   . Medical history non-contributory    sickle cell  . Menometrorrhagia   . 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 Sickle cell trait [facility])     Past Surgical History:  Procedure Laterality Date  . CESAREAN SECTION     all three pregnancies    Allergies  Allergen Reactions  . Bupropion Hcl Er (Sr) Hives and Other (See Comments)    The patient has an entire page of reactions that can be caused by the medication, but none that she has suffered.  . Fruit &  Vegetable Daily [Nutritional Supplements] Other (See Comments)    "all fruits causes bumps and weird feeling in her mouth"  . Penicillins Hives  . Pollen Extract-Tree Extract Other (See Comments)    Allergies  . Other Rash    "bugs" roaches     Outpatient Medications Prior to Visit  Medication Sig Dispense Refill  . clotrimazole (LOTRIMIN) 1 % cream Apply 1 application topically 2 (two) times daily. 45 g 1  . triamcinolone cream (KENALOG) 0.1 % APPLY 1 APPLICATION TOPICALLY 2 (TWO) TIMES DAILY. 30 g 1  . acetaminophen-codeine (TYLENOL #3) 300-30 MG tablet TAKE 1 TABLET BY MOUTH EVERY 12 HOURS AS NEEDED FOR MODERATE PAIN. 60 tablet 2  . albuterol (VENTOLIN HFA) 108 (90 Base) MCG/ACT inhaler Inhale 2 puffs into the lungs every 6 (six) hours as needed for wheezing or shortness of breath. 54 g 3  . busPIRone (BUSPAR) 10 MG tablet Take 1 tablet (10 mg total) by mouth 2 (two) times daily. 60 tablet 3  . cloNIDine (CATAPRES) 0.2 MG tablet Take 1 tablet (0.2 mg total) by mouth at bedtime. 30 tablet 3  . cyclobenzaprine (FLEXERIL) 10 MG tablet TAKE 1 TABLET BY MOUTH 2 TIMES DAILY AS NEEDED FOR MUSCLE SPASMS. 60 tablet 3  . FLUoxetine (PROZAC) 40 MG capsule Take 1 capsule (40 mg total) by mouth daily. 30 capsule 3  . Fluticasone-Salmeterol (ADVAIR DISKUS)  500-50 MCG/DOSE AEPB Inhale 1 puff into the lungs 2 (two) times daily. 180 each 3  . furosemide (LASIX) 20 MG tablet TAKE 1 TABLET BY MOUTH DAILY AS NEEDED FOR PEDAL EDEMA 30 tablet 1  . gabapentin (NEURONTIN) 300 MG capsule TAKE 1 CAPSULE BY MOUTH 2 TIMES DAILY. 60 capsule 3  . lactulose (CHRONULAC) 10 GM/15ML solution Take 15 mLs (10 g total) by mouth 2 (two) times daily as needed for mild constipation. 946 mL 1  . levocetirizine (XYZAL) 5 MG tablet Take 1 tablet (5 mg total) by mouth every evening. 30 tablet 2  . meloxicam (MOBIC) 7.5 MG tablet Take 1 tablet (7.5 mg total) by mouth daily. 30 tablet 3  . traZODone (DESYREL) 50 MG tablet Take 1  tablet (50 mg total) by mouth at bedtime as needed. for sleep 30 tablet 3  . triamcinolone cream (KENALOG) 0.1 % APPLY 1 APPLICATION TOPICALLY 2 (TWO) TIMES DAILY. 30 g 0  . cloNIDine (CATAPRES) 0.1 MG tablet TAKE 1 TABLET (0.1 MG TOTAL) BY MOUTH AT BEDTIME. (Patient not taking: Reported on 12/12/2017) 30 tablet 3   No facility-administered medications prior to visit.     ROS Review of Systems  Constitutional: Positive for unexpected weight change. Negative for activity change, appetite change and fatigue.  HENT: Negative for congestion, sinus pressure and sore throat.   Eyes: Negative for visual disturbance.  Respiratory: Negative for cough, chest tightness, shortness of breath and wheezing.   Cardiovascular: Negative for chest pain and palpitations.  Gastrointestinal: Negative for abdominal distention, abdominal pain and constipation.  Endocrine: Negative for polydipsia.  Genitourinary: Negative for dysuria and frequency.  Musculoskeletal: Positive for back pain and gait problem. Negative for arthralgias.  Skin: Negative for rash.  Neurological: Negative for tremors, light-headedness and numbness.  Hematological: Does not bruise/bleed easily.  Psychiatric/Behavioral: Negative for agitation and behavioral problems.    Objective:  BP 103/71   Pulse 77   Temp 97.9 F (36.6 C) (Oral)   Ht _0  (1.549 m)   Wt 206 lb 9.6 oz (93.7 kg)   SpO2 97%   BMI 39.04 kg/m   BP/Weight 12/12/2017 09/13/2017 3/84/6659  Systolic BP 935 95 701  Diastolic BP 71 65 75  Wt. (Lbs) 206.6 195 183  BMI 39.04 36.84 34.58  Some encounter information is confidential and restricted. Go to Review Flowsheets activity to see all data.      Physical Exam  Constitutional: She is oriented to person, place, and time. She appears well-developed and well-nourished.  Cardiovascular: Normal rate, normal heart sounds and intact distal pulses.  No murmur heard. Pulmonary/Chest: Effort normal and breath sounds  normal. She has no wheezes. She has no rales. She exhibits no tenderness.  Abdominal: Soft. Bowel sounds are normal. She exhibits no distension and no mass. There is no tenderness.  Musculoskeletal: Normal range of motion. She exhibits tenderness (TTP across lumbar spine and on lying supine; neg straight leg raise b/l).  Ambulates with a cane  Neurological: She is alert and oriented to person, place, and time.  Skin: Skin is warm and dry.  Psychiatric: She has a normal mood and affect.    CMP Latest Ref Rng & Units 06/12/2017 10/09/2016 08/02/2016  Glucose 65 - 99 mg/dL 87 74 89  BUN 6 - 24 mg/dL 5(L) 7 5(L)  Creatinine 0.57 - 1.00 mg/dL 0.92 0.67 0.80  Sodium 134 - 144 mmol/L 139 140 142  Potassium 3.5 - 5.2 mmol/L 3.7 4.0 3.4(L)  Chloride 96 -  106 mmol/L 103 106 103  CO2 20 - 29 mmol/L 24 25 -  Calcium 8.7 - 10.2 mg/dL 8.8 8.8 -  Total Protein 6.0 - 8.5 g/dL 6.7 - -  Total Bilirubin 0.0 - 1.2 mg/dL 0.2 - -  Alkaline Phos 39 - 117 IU/L 73 - -  AST 0 - 40 IU/L 20 - -  ALT 0 - 32 IU/L 10 - -    Lipid Panel     Component Value Date/Time   CHOL 148 05/15/2016 0949   TRIG 65 05/15/2016 0949   HDL 50 05/15/2016 0949   CHOLHDL 3.0 05/15/2016 0949   VLDL 13 05/15/2016 0949   LDLCALC 85 05/15/2016 0949     Lab Results  Component Value Date   HGBA1C 4.8 12/12/2017     Assessment & Plan:   1. Spinal stenosis, lumbar region, with neurogenic claudication Controlled but pain is variable on current regimen Previously referred to spine surgery for possible corticosteroid injection however she would like to hold off on this Controlled substance agreement signed again in epic Last dose of tramadol was last night; urine drug screen today - acetaminophen-codeine (TYLENOL #3) 300-30 MG tablet; TAKE 1 TABLET BY MOUTH EVERY 12 HOURS AS NEEDED FOR MODERATE PAIN.  Dispense: 60 tablet; Refill: 2 - Drug Screen 12+Alcohol+CRT, Ur - cyclobenzaprine (FLEXERIL) 10 MG tablet; TAKE 1 TABLET BY  MOUTH 2 TIMES DAILY AS NEEDED FOR MUSCLE SPASMS.  Dispense: 60 tablet; Refill: 3 - gabapentin (NEURONTIN) 300 MG capsule; TAKE 1 CAPSULE BY MOUTH 2 TIMES DAILY.  Dispense: 60 capsule; Refill: 3 - meloxicam (MOBIC) 7.5 MG tablet; Take 1 tablet (7.5 mg total) by mouth daily.  Dispense: 30 tablet; Refill: 3  2. Anxiety Controlled 10 mg of BuSpar on back order so I have changed to 7.5 mg - busPIRone (BUSPAR) 7.5 MG tablet; Take 1 tablet (7.5 mg total) by mouth 2 (two) times daily.  Dispense: 60 tablet; Refill: 3  3. Mild persistent asthma without complication Controlled No recent flare - albuterol (VENTOLIN HFA) 108 (90 Base) MCG/ACT inhaler; Inhale 2 puffs into the lungs every 6 (six) hours as needed for wheezing or shortness of breath.  Dispense: 54 g; Refill: 3 - Lipid panel - Fluticasone-Salmeterol (ADVAIR DISKUS) 500-50 MCG/DOSE AEPB; Inhale 1 puff into the lungs 2 (two) times daily.  Dispense: 180 each; Refill: 3 - CMP14+EGFR  4. Screening for diabetes mellitus A1c is 4.8 - POCT glycosylated hemoglobin (Hb A1C)  5. Hot flashes Controlled - cloNIDine (CATAPRES) 0.2 MG tablet; Take 1 tablet (0.2 mg total) by mouth at bedtime.  Dispense: 30 tablet; Refill: 3  6. Cervical disc disorder with radiculopathy of cervical region Uncontrolled but stable on current regimen - cyclobenzaprine (FLEXERIL) 10 MG tablet; TAKE 1 TABLET BY MOUTH 2 TIMES DAILY AS NEEDED FOR MUSCLE SPASMS.  Dispense: 60 tablet; Refill: 3  7. Moderate episode of recurrent major depressive disorder (HCC) Controlled - FLUoxetine (PROZAC) 40 MG capsule; Take 1 capsule (40 mg total) by mouth daily.  Dispense: 30 capsule; Refill: 3  8. Pedal edema Use as needed for pedal edema - furosemide (LASIX) 20 MG tablet; TAKE 1 TABLET BY MOUTH DAILY AS NEEDED FOR PEDAL EDEMA  Dispense: 30 tablet; Refill: 1  9. Other constipation Increase fiber intake - lactulose (CHRONULAC) 10 GM/15ML solution; Take 15 mLs (10 g total) by mouth 2  (two) times daily as needed for mild constipation.  Dispense: 946 mL; Refill: 1  10. Other insomnia Controlled - traZODone (DESYREL)  50 MG tablet; Take 1 tablet (50 mg total) by mouth at bedtime as needed. for sleep  Dispense: 30 tablet; Refill: 3  11. Obesity (BMI 35.0-39.9 without comorbidity) She has gained 23 pounds in the last several months She is sedentary due to low back pain and ambulation difficulties We have discussed cutting back on caloric intake, avoiding late meals Limited ability to exercise due to back pain Weight gain could also be from gabapentin but I have expressed my concern that this is more of a lifestyle issue than from gabapentin alone   Meds ordered this encounter  Medications  . acetaminophen-codeine (TYLENOL #3) 300-30 MG tablet    Sig: TAKE 1 TABLET BY MOUTH EVERY 12 HOURS AS NEEDED FOR MODERATE PAIN.    Dispense:  60 tablet    Refill:  2  . DISCONTD: busPIRone (BUSPAR) 10 MG tablet    Sig: Take 1 tablet (10 mg total) by mouth 2 (two) times daily.    Dispense:  60 tablet    Refill:  3  . albuterol (VENTOLIN HFA) 108 (90 Base) MCG/ACT inhaler    Sig: Inhale 2 puffs into the lungs every 6 (six) hours as needed for wheezing or shortness of breath.    Dispense:  54 g    Refill:  3  . cloNIDine (CATAPRES) 0.2 MG tablet    Sig: Take 1 tablet (0.2 mg total) by mouth at bedtime.    Dispense:  30 tablet    Refill:  3    Discontinue previous dose  . cyclobenzaprine (FLEXERIL) 10 MG tablet    Sig: TAKE 1 TABLET BY MOUTH 2 TIMES DAILY AS NEEDED FOR MUSCLE SPASMS.    Dispense:  60 tablet    Refill:  3  . FLUoxetine (PROZAC) 40 MG capsule    Sig: Take 1 capsule (40 mg total) by mouth daily.    Dispense:  30 capsule    Refill:  3  . Fluticasone-Salmeterol (ADVAIR DISKUS) 500-50 MCG/DOSE AEPB    Sig: Inhale 1 puff into the lungs 2 (two) times daily.    Dispense:  180 each    Refill:  3  . furosemide (LASIX) 20 MG tablet    Sig: TAKE 1 TABLET BY MOUTH DAILY  AS NEEDED FOR PEDAL EDEMA    Dispense:  30 tablet    Refill:  1  . gabapentin (NEURONTIN) 300 MG capsule    Sig: TAKE 1 CAPSULE BY MOUTH 2 TIMES DAILY.    Dispense:  60 capsule    Refill:  3  . lactulose (CHRONULAC) 10 GM/15ML solution    Sig: Take 15 mLs (10 g total) by mouth 2 (two) times daily as needed for mild constipation.    Dispense:  946 mL    Refill:  1  . levocetirizine (XYZAL) 5 MG tablet    Sig: Take 1 tablet (5 mg total) by mouth every evening.    Dispense:  30 tablet    Refill:  2  . meloxicam (MOBIC) 7.5 MG tablet    Sig: Take 1 tablet (7.5 mg total) by mouth daily.    Dispense:  30 tablet    Refill:  3  . traZODone (DESYREL) 50 MG tablet    Sig: Take 1 tablet (50 mg total) by mouth at bedtime as needed. for sleep    Dispense:  30 tablet    Refill:  3  . busPIRone (BUSPAR) 7.5 MG tablet    Sig: Take 1 tablet (7.5 mg total) by  mouth 2 (two) times daily.    Dispense:  60 tablet    Refill:  3    Follow-up: Return in about 3 months (around 03/14/2018) for follow up of chronic medical conditions.   Charlott Rakes MD

## 2017-12-13 ENCOUNTER — Other Ambulatory Visit: Payer: Self-pay

## 2017-12-13 ENCOUNTER — Telehealth: Payer: Self-pay | Admitting: Family Medicine

## 2017-12-13 LAB — CMP14+EGFR
ALT: 16 IU/L (ref 0–32)
AST: 21 IU/L (ref 0–40)
Albumin/Globulin Ratio: 1.6 (ref 1.2–2.2)
Albumin: 4.2 g/dL (ref 3.5–5.5)
Alkaline Phosphatase: 91 IU/L (ref 39–117)
BILIRUBIN TOTAL: 0.5 mg/dL (ref 0.0–1.2)
BUN/Creatinine Ratio: 6 — ABNORMAL LOW (ref 9–23)
BUN: 6 mg/dL (ref 6–24)
CALCIUM: 9.2 mg/dL (ref 8.7–10.2)
CO2: 27 mmol/L (ref 20–29)
Chloride: 102 mmol/L (ref 96–106)
Creatinine, Ser: 0.98 mg/dL (ref 0.57–1.00)
GFR, EST AFRICAN AMERICAN: 79 mL/min/{1.73_m2} (ref >59)
GFR, EST NON AFRICAN AMERICAN: 68 mL/min/{1.73_m2} (ref >59)
GLUCOSE: 78 mg/dL (ref 65–99)
Globulin, Total: 2.6 g/dL (ref 1.5–4.5)
Potassium: 3.7 mmol/L (ref 3.5–5.2)
Sodium: 142 mmol/L (ref 134–144)
TOTAL PROTEIN: 6.8 g/dL (ref 6.0–8.5)

## 2017-12-13 LAB — LIPID PANEL
CHOL/HDL RATIO: 4.1 ratio (ref 0.0–4.4)
Cholesterol, Total: 226 mg/dL — ABNORMAL HIGH (ref 100–199)
HDL: 55 mg/dL (ref >39)
LDL Calculated: 154 mg/dL — ABNORMAL HIGH (ref 0–99)
Triglycerides: 85 mg/dL (ref 0–149)
VLDL Cholesterol Cal: 17 mg/dL (ref 5–40)

## 2017-12-13 MED ORDER — TETANUS-DIPHTH-ACELL PERTUSSIS 5-2.5-18.5 LF-MCG/0.5 IM SUSP: 0.5000 mL | Freq: Once | INTRAMUSCULAR | 0 refills | Status: AC

## 2017-12-13 MED ORDER — CLINDAMYCIN HCL 300 MG PO CAPS: 300.0000 mg | ORAL_CAPSULE | Freq: Two times a day (BID) | ORAL | 0 refills | Status: DC

## 2017-12-13 MED FILL — ?CLINDAMYCIN HCL 300 MG CAP: 300 | 10 days supply | Qty: 20 | Fill #0

## 2017-12-13 NOTE — Telephone Encounter (Signed)
Pt called to request medication for her tooth ache. Please follow up. If any medication is prescribed please send to [facility]

## 2017-12-13 NOTE — Telephone Encounter (Signed)
Antibiotics sent to pharmacy

## 2017-12-13 NOTE — Telephone Encounter (Signed)
Patient was called and informed that medication has been sent to onsite pharmacy.

## 2017-12-13 NOTE — Addendum Note (Signed)
Addended by: Charlott Rakes on: 12/13/2017 02:43 PM   Modules accepted: Orders

## 2017-12-17 LAB — DRUG SCREEN 12+ALCOHOL+CRT, UR
Amphetamines, Urine: NEGATIVE ng/mL
BENZODIAZ UR QL: NEGATIVE ng/mL
Barbiturate: NEGATIVE ng/mL
CANNABINOIDS: NEGATIVE ng/mL
CREATININE, RANDOM U: 107.6 mg/dL (ref 20.0–300.0)
Cocaine (Metabolite): NEGATIVE ng/mL
Ethanol, Urine: NEGATIVE %
MEPERIDINE: NEGATIVE ng/mL
METHADONE: NEGATIVE ng/mL
OPIATE SCREEN URINE: POSITIVE ng/mL — AB
OXYCODONE+OXYMORPHONE UR QL SCN: NEGATIVE ng/mL
PHENCYCLIDINE: NEGATIVE ng/mL
Propoxyphene: NEGATIVE ng/mL
Tramadol: NEGATIVE ng/mL

## 2017-12-18 MED FILL — $BOOSTRIX VACCINE SYRINGE: 5-2.5-18.5 | 1 days supply | Qty: 1 | Fill #0

## 2017-12-23 ENCOUNTER — Other Ambulatory Visit: Payer: Self-pay | Admitting: Family Medicine

## 2017-12-23 ENCOUNTER — Telehealth: Payer: Self-pay

## 2017-12-23 NOTE — Telephone Encounter (Signed)
Patient was called and informed to contact office for lab results.   If patient returns phone call please inform patient that Cholesterol is elevated; please encourage to work on a low-cholesterol diet and exercise as well as weight loss. If cholesterol trends up further we will have to place her on a statin.

## 2017-12-24 NOTE — Telephone Encounter (Signed)
Patient return CMA call regarding a missed called   CMA inform patient about lab results   Patient was aware and understood

## 2018-01-08 MED FILL — ?FUROSEMIDE 20 MG TABLET: 20 | 30 days supply | Qty: 30 | Fill #1

## 2018-01-08 MED FILL — FLUoxetine HCL 40 MG CAPS: 40 | 30 days supply | Qty: 30 | Fill #2

## 2018-01-08 MED FILL — MELOXICAM 7.5 MG TABLET: 7.5 | 30 days supply | Qty: 30 | Fill #3

## 2018-01-08 MED FILL — GABAPENTIN 300 MG CAPSULE: 300 | 30 days supply | Qty: 60 | Fill #3

## 2018-01-08 MED FILL — CYCLOBENZAPRINE 10 MG TAB: 10 | 30 days supply | Qty: 60 | Fill #1

## 2018-01-08 MED FILL — busPIRone HCL 7.5 MG TABS: 7.5 | 30 days supply | Qty: 60 | Fill #1

## 2018-01-08 MED FILL — ?CLONIDINE HCL 0.1 MG TABL: 0.1 | 30 days supply | Qty: 30 | Fill #1

## 2018-01-08 MED FILL — traZODone HCL 50 MG TABS: 50 | 30 days supply | Qty: 30 | Fill #1

## 2018-01-08 MED FILL — ACETAMINOPHEN/COD #3 TABLET: 300-30 | 30 days supply | Qty: 60 | Fill #1

## 2018-01-09 MED FILL — $VENTOLIN HFA 18G INHALER: 108 (90 BAS | 25 days supply | Qty: 18 | Fill #0

## 2018-01-09 MED FILL — TRIAMCINOLONE ACETONIDE 0.1: 0.1 | 15 days supply | Qty: 30 | Fill #1

## 2018-01-09 MED FILL — $ADVAIR 500/50MCG INHALER: 500-50 | 30 days supply | Qty: 60 | Fill #0

## 2018-02-04 ENCOUNTER — Other Ambulatory Visit: Payer: Self-pay | Admitting: Family Medicine

## 2018-02-04 DIAGNOSIS — F419 Anxiety disorder, unspecified: Secondary | ICD-10-CM

## 2018-02-04 DIAGNOSIS — L308 Other specified dermatitis: Secondary | ICD-10-CM

## 2018-02-04 MED ORDER — BUSPIRONE HCL 15 MG PO TABS: 15.0000 mg | ORAL_TABLET | Freq: Every day | ORAL | 2 refills | Status: DC

## 2018-02-04 MED FILL — traZODone HCL 50 MG TABS: 50 | 30 days supply | Qty: 30 | Fill #2

## 2018-02-04 MED FILL — GABAPENTIN 300 MG CAPSULE: 300 | 30 days supply | Qty: 60 | Fill #0

## 2018-02-04 MED FILL — TRIAMCINOLONE ACETONIDE 0.1: 0.1 | 7 days supply | Qty: 30 | Fill #0

## 2018-02-04 MED FILL — MELOXICAM 7.5 MG TABLET: 7.5 | 30 days supply | Qty: 30 | Fill #0

## 2018-02-04 MED FILL — $ADVAIR 500/50MCG INHALER: 500-50 | 30 days supply | Qty: 60 | Fill #1

## 2018-02-04 MED FILL — FLUoxetine HCL 40 MG CAPS: 40 | 30 days supply | Qty: 30 | Fill #3

## 2018-02-04 MED FILL — CYCLOBENZAPRINE 10 MG TAB: 10 | 30 days supply | Qty: 60 | Fill #2

## 2018-02-04 MED FILL — $VENTOLIN HFA 18G INHALER: 108 (90 BAS | 25 days supply | Qty: 18 | Fill #1

## 2018-02-04 MED FILL — ?CLONIDINE HCL 0.1 MG TABL: 0.1 | 30 days supply | Qty: 30 | Fill #2

## 2018-02-04 MED FILL — ACETAMINOPHEN/COD #3 TABLET: 300-30 | 30 days supply | Qty: 60 | Fill #2

## 2018-02-04 MED FILL — ?FUROSEMIDE 20 MG TABLET: 20 | 30 days supply | Qty: 30 | Fill #0

## 2018-02-04 MED FILL — busPIRone HCL 15 MG TABS: 15 | 30 days supply | Qty: 30 | Fill #0

## 2018-03-03 ENCOUNTER — Other Ambulatory Visit: Payer: Self-pay | Admitting: Family Medicine

## 2018-03-03 DIAGNOSIS — M48062 Spinal stenosis, lumbar region with neurogenic claudication: Secondary | ICD-10-CM

## 2018-03-03 MED FILL — ?FUROSEMIDE 20 MG TABLET: 20 | 30 days supply | Qty: 30 | Fill #1

## 2018-03-03 MED FILL — $ADVAIR 500/50MCG INHALER: 500-50 | 30 days supply | Qty: 60 | Fill #2

## 2018-03-03 MED FILL — MELOXICAM 7.5 MG TABLET: 7.5 | 30 days supply | Qty: 30 | Fill #1

## 2018-03-03 MED FILL — traZODone HCL 50 MG TABS: 50 | 30 days supply | Qty: 30 | Fill #3

## 2018-03-03 MED FILL — FLUoxetine HCL 40 MG CAPS: 40 | 30 days supply | Qty: 30 | Fill #1

## 2018-03-03 MED FILL — ?CLONIDINE HCL 0.1 MG TABL: 0.1 | 30 days supply | Qty: 30 | Fill #3

## 2018-03-03 MED FILL — $VENTOLIN HFA 18G INHALER: 108 (90 BAS | 25 days supply | Qty: 18 | Fill #2

## 2018-03-03 MED FILL — busPIRone HCL 15 MG TABS: 15 | 30 days supply | Qty: 30 | Fill #1

## 2018-03-03 MED FILL — CYCLOBENZAPRINE 10 MG TAB: 10 | 30 days supply | Qty: 60 | Fill #3

## 2018-03-03 MED FILL — GABAPENTIN 300 MG CAPSULE: 300 | 30 days supply | Qty: 60 | Fill #1

## 2018-03-04 MED FILL — ACETAMINOPHEN/COD #3 TABLET: 300-30 | 30 days supply | Qty: 60 | Fill #0

## 2018-03-04 MED FILL — TRIAMCINOLONE ACETONIDE 0.1: 0.1 | 7 days supply | Qty: 30 | Fill #1

## 2018-03-12 ENCOUNTER — Ambulatory Visit: Payer: Self-pay | Admitting: Family Medicine

## 2018-03-18 ENCOUNTER — Encounter: Payer: Self-pay | Admitting: Family Medicine

## 2018-03-18 ENCOUNTER — Ambulatory Visit: Payer: Self-pay | Attending: Family Medicine | Admitting: Family Medicine

## 2018-03-18 DIAGNOSIS — Z79899 Other long term (current) drug therapy: Secondary | ICD-10-CM | POA: Insufficient documentation

## 2018-03-18 DIAGNOSIS — Z880 Allergy status to penicillin: Secondary | ICD-10-CM | POA: Insufficient documentation

## 2018-03-18 DIAGNOSIS — G4709 Other insomnia: Secondary | ICD-10-CM

## 2018-03-18 DIAGNOSIS — M4802 Spinal stenosis, cervical region: Secondary | ICD-10-CM | POA: Insufficient documentation

## 2018-03-18 DIAGNOSIS — D573 Sickle-cell trait: Secondary | ICD-10-CM | POA: Insufficient documentation

## 2018-03-18 DIAGNOSIS — G4700 Insomnia, unspecified: Secondary | ICD-10-CM | POA: Insufficient documentation

## 2018-03-18 DIAGNOSIS — F419 Anxiety disorder, unspecified: Secondary | ICD-10-CM | POA: Insufficient documentation

## 2018-03-18 DIAGNOSIS — J4530 Mild persistent asthma, uncomplicated: Secondary | ICD-10-CM | POA: Insufficient documentation

## 2018-03-18 DIAGNOSIS — F331 Major depressive disorder, recurrent, moderate: Secondary | ICD-10-CM | POA: Insufficient documentation

## 2018-03-18 DIAGNOSIS — M48062 Spinal stenosis, lumbar region with neurogenic claudication: Secondary | ICD-10-CM | POA: Insufficient documentation

## 2018-03-18 DIAGNOSIS — N951 Menopausal and female climacteric states: Secondary | ICD-10-CM | POA: Insufficient documentation

## 2018-03-18 DIAGNOSIS — R232 Flushing: Secondary | ICD-10-CM

## 2018-03-18 DIAGNOSIS — M5134 Other intervertebral disc degeneration, thoracic region: Secondary | ICD-10-CM | POA: Insufficient documentation

## 2018-03-18 DIAGNOSIS — I10 Essential (primary) hypertension: Secondary | ICD-10-CM | POA: Insufficient documentation

## 2018-03-18 MED ORDER — LEVOCETIRIZINE DIHYDROCHLORIDE 5 MG PO TABS: 5.0000 mg | ORAL_TABLET | Freq: Every evening | ORAL | 2 refills | Status: DC

## 2018-03-18 MED ORDER — MELOXICAM 7.5 MG PO TABS: 7.5000 mg | ORAL_TABLET | Freq: Every day | ORAL | 3 refills | Status: DC

## 2018-03-18 MED ORDER — TRAZODONE HCL 50 MG PO TABS
50.0000 mg | ORAL_TABLET | Freq: Every evening | ORAL | 3 refills | Status: DC | PRN
Start: 2018-03-18 — End: 2018-09-15

## 2018-03-18 MED ORDER — BUSPIRONE HCL 15 MG PO TABS: 15.0000 mg | ORAL_TABLET | Freq: Every day | ORAL | 1 refills | Status: DC

## 2018-03-18 MED ORDER — ALBUTEROL SULFATE HFA 108 (90 BASE) MCG/ACT IN AERS: 2.0000 | INHALATION_SPRAY | Freq: Four times a day (QID) | RESPIRATORY_TRACT | 3 refills | Status: DC | PRN

## 2018-03-18 MED ORDER — LIDOCAINE 5 % EX PTCH: 1.0000 | MEDICATED_PATCH | CUTANEOUS | 3 refills | Status: DC

## 2018-03-18 MED ORDER — CLONIDINE HCL 0.2 MG PO TABS: 0.2000 mg | ORAL_TABLET | Freq: Every day | ORAL | 3 refills | Status: DC

## 2018-03-18 MED ORDER — FLUTICASONE-SALMETEROL 500-50 MCG/DOSE IN AEPB: 1.0000 | INHALATION_SPRAY | Freq: Two times a day (BID) | RESPIRATORY_TRACT | 3 refills | Status: DC

## 2018-03-18 MED ORDER — GABAPENTIN 300 MG PO CAPS: ORAL_CAPSULE | ORAL | 3 refills | Status: DC

## 2018-03-18 MED ORDER — ACETAMINOPHEN-CODEINE #3 300-30 MG PO TABS: 1.0000 | ORAL_TABLET | Freq: Two times a day (BID) | ORAL | 2 refills | Status: DC | PRN

## 2018-03-18 MED ORDER — FLUOXETINE HCL 40 MG PO CAPS: 40.0000 mg | ORAL_CAPSULE | Freq: Every day | ORAL | 1 refills | Status: DC

## 2018-03-18 MED FILL — ?LIDOCAINE 5% PATCH: 5 | 30 days supply | Qty: 30 | Fill #0

## 2018-03-18 NOTE — Progress Notes (Signed)
Subjective:  Patient ID: [name], female    DOB: Apr 04, 1971  Age: 47 y.o. MRN: [id number]  CC: Asthma   HPI [name] is a 47 year old female with a history of asthma, hypertension, depression, chronic low back pain secondary to spinal stenosis, degenerative disc disease of the thoracic spine, cervical stenosis, hot flashes, chronic constipation who presents today for a follow-up visit. Back pain is now controlled on her current regimen and she is wondering if something else can be done.  She has been unable to see a neurosurgeon due to lack of medical coverage and she is not open to receiving epidural spinal injections.  Pain is described as a 10/10 in her lower back. With regard to her anxiety and depression she receives counseling at [facility]. Doing well on her anti-passive and her asthma has been stable with no recent exacerbations.  Past Medical History:  Diagnosis Date  . Allergy    pollen, pcn  . Asthma   . Depression   . Fibroids   . History of ovarian cyst   . Medical history non-contributory    sickle cell  . Menometrorrhagia   . 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 Sickle cell trait [facility])     Past Surgical History:  Procedure Laterality Date  . CESAREAN SECTION     all three pregnancies    Allergies  Allergen Reactions  . Bupropion Hcl Er (Sr) Hives and Other (See Comments)    The patient has an entire page of reactions that can be caused by the medication, but none that she has suffered.  . Fruit & Vegetable Daily [Nutritional Supplements] Other (See Comments)    "all fruits causes bumps and weird feeling in her mouth"  . Penicillins Hives  . Pollen Extract-Tree Extract Other (See Comments)    Allergies  . Other Rash    "bugs" roaches     Outpatient Medications Prior to Visit  Medication Sig Dispense Refill  . clotrimazole (LOTRIMIN) 1 % cream Apply 1 application topically 2 (two) times daily. 45 g 1  . cyclobenzaprine (FLEXERIL)  10 MG tablet TAKE 1 TABLET BY MOUTH 2 TIMES DAILY AS NEEDED FOR MUSCLE SPASMS. 60 tablet 3  . furosemide (LASIX) 20 MG tablet TAKE 1 TABLET BY MOUTH DAILY AS NEEDED FOR PEDAL EDEMA 30 tablet 1  . lactulose (CHRONULAC) 10 GM/15ML solution Take 15 mLs (10 g total) by mouth 2 (two) times daily as needed for mild constipation. 946 mL 1  . triamcinolone cream (KENALOG) 0.1 % APPLY 1 APPLICATION TOPICALLY 2 (TWO) TIMES DAILY. 30 g 1  . acetaminophen-codeine (TYLENOL #3) 300-30 MG tablet TAKE 1 TABLET BY MOUTH EVERY 12 HOURS AS NEEDED FOR MODERATE PAIN. 60 tablet 2  . albuterol (VENTOLIN HFA) 108 (90 Base) MCG/ACT inhaler Inhale 2 puffs into the lungs every 6 (six) hours as needed for wheezing or shortness of breath. 54 g 3  . busPIRone (BUSPAR) 15 MG tablet Take 1 tablet (15 mg total) by mouth daily. 30 tablet 2  . cloNIDine (CATAPRES) 0.2 MG tablet Take 1 tablet (0.2 mg total) by mouth at bedtime. 30 tablet 3  . FLUoxetine (PROZAC) 40 MG capsule Take 1 capsule (40 mg total) by mouth daily. 30 capsule 3  . Fluticasone-Salmeterol (ADVAIR DISKUS) 500-50 MCG/DOSE AEPB Inhale 1 puff into the lungs 2 (two) times daily. 180 each 3  . gabapentin (NEURONTIN) 300 MG capsule TAKE 1 CAPSULE BY MOUTH 2 TIMES DAILY.  60 capsule 3  . levocetirizine (XYZAL) 5 MG tablet Take 1 tablet (5 mg total) by mouth every evening. 30 tablet 2  . meloxicam (MOBIC) 7.5 MG tablet Take 1 tablet (7.5 mg total) by mouth daily. 30 tablet 3  . traZODone (DESYREL) 50 MG tablet Take 1 tablet (50 mg total) by mouth at bedtime as needed. for sleep 30 tablet 3  . clindamycin (CLEOCIN) 300 MG capsule Take 1 capsule (300 mg total) by mouth 2 (two) times daily. (Patient not taking: Reported on 03/18/2018) 20 capsule 0   No facility-administered medications prior to visit.     ROS Review of Systems  Constitutional: Negative for activity change, appetite change and fatigue.  HENT: Negative for congestion, sinus pressure and sore throat.   Eyes:  Negative for visual disturbance.  Respiratory: Negative for cough, chest tightness, shortness of breath and wheezing.   Cardiovascular: Negative for chest pain and palpitations.  Gastrointestinal: Negative for abdominal distention, abdominal pain and constipation.  Endocrine: Negative for polydipsia.  Genitourinary: Negative for dysuria and frequency.  Musculoskeletal: Negative for arthralgias and back pain.  Skin: Negative for rash.  Neurological: Negative for tremors, light-headedness and numbness.  Hematological: Does not bruise/bleed easily.  Psychiatric/Behavioral: Negative for agitation and behavioral problems.    Objective:  BP 107/75   Pulse 75   Temp 98 F (36.7 C) (Oral)   Ht 5\' 1"  (1.549 m)   Wt 205 lb (93 kg)   SpO2 99%   BMI 38.73 kg/m   BP/Weight 03/18/2018 12/12/2017 40/34/7425  Systolic BP 956 387 95  Diastolic BP 75 71 65  Wt. (Lbs) 205 206.6 195  BMI 38.73 39.04 36.84  Some encounter information is confidential and restricted. Go to Review Flowsheets activity to see all data.      Physical Exam  Constitutional: She is oriented to person, place, and time. She appears well-developed and well-nourished.  Cardiovascular: Normal rate, normal heart sounds and intact distal pulses.  No murmur heard. Pulmonary/Chest: Effort normal and breath sounds normal. She has no wheezes. She has no rales. She exhibits no tenderness.  Abdominal: Soft. Bowel sounds are normal. She exhibits no distension and no mass. There is no tenderness.  Musculoskeletal: She exhibits tenderness (TTP across lumba rspine, +ve straight raise b/l).  Neurological: She is alert and oriented to person, place, and time.  Skin: Skin is warm and dry.  Psychiatric: She has a normal mood and affect.     Assessment & Plan:   1. Anxiety Stable - busPIRone (BUSPAR) 15 MG tablet; Take 1 tablet (15 mg total) by mouth daily.  Dispense: 30 tablet; Refill: 1  2. Mild persistent asthma without  complication Controlled - albuterol (VENTOLIN HFA) 108 (90 Base) MCG/ACT inhaler; Inhale 2 puffs into the lungs every 6 (six) hours as needed for wheezing or shortness of breath.  Dispense: 54 g; Refill: 3 - Fluticasone-Salmeterol (ADVAIR DISKUS) 500-50 MCG/DOSE AEPB; Inhale 1 puff into the lungs 2 (two) times daily.  Dispense: 180 each; Refill: 3  3. Hot flashes Controlled - cloNIDine (CATAPRES) 0.2 MG tablet; Take 1 tablet (0.2 mg total) by mouth at bedtime.  Dispense: 30 tablet; Refill: 3  4. Moderate episode of recurrent major depressive disorder (HCC) Stable Also followed by [facility] - FLUoxetine (PROZAC) 40 MG capsule; Take 1 capsule (40 mg total) by mouth daily.  Dispense: 30 capsule; Refill: 1  5. Spinal stenosis, lumbar region, with neurogenic claudication Uncontrolled Lidoderm patch added to regimen - gabapentin (NEURONTIN) 300 MG  capsule; TAKE 1 CAPSULE BY MOUTH 2 TIMES DAILY.  Dispense: 60 capsule; Refill: 3 - meloxicam (MOBIC) 7.5 MG tablet; Take 1 tablet (7.5 mg total) by mouth daily.  Dispense: 30 tablet; Refill: 3 - acetaminophen-codeine (TYLENOL #3) 300-30 MG tablet; Take 1 tablet by mouth every 12 (twelve) hours as needed for moderate pain.  Dispense: 60 tablet; Refill: 2 - lidocaine (LIDODERM) 5 %; Place 1 patch onto the skin daily. Remove & Discard patch within 12 hours or as directed by MD  Dispense: 30 patch; Refill: 3 - Ambulatory referral to Physical Therapy  6. Other insomnia Controlled - traZODone (DESYREL) 50 MG tablet; Take 1 tablet (50 mg total) by mouth at bedtime as needed. for sleep  Dispense: 30 tablet; Refill: 3   Meds ordered this encounter  Medications  . busPIRone (BUSPAR) 15 MG tablet    Sig: Take 1 tablet (15 mg total) by mouth daily.    Dispense:  30 tablet    Refill:  1  . albuterol (VENTOLIN HFA) 108 (90 Base) MCG/ACT inhaler    Sig: Inhale 2 puffs into the lungs every 6 (six) hours as needed for wheezing or shortness of breath.     Dispense:  54 g    Refill:  3  . cloNIDine (CATAPRES) 0.2 MG tablet    Sig: Take 1 tablet (0.2 mg total) by mouth at bedtime.    Dispense:  30 tablet    Refill:  3    Discontinue previous dose  . FLUoxetine (PROZAC) 40 MG capsule    Sig: Take 1 capsule (40 mg total) by mouth daily.    Dispense:  30 capsule    Refill:  1  . Fluticasone-Salmeterol (ADVAIR DISKUS) 500-50 MCG/DOSE AEPB    Sig: Inhale 1 puff into the lungs 2 (two) times daily.    Dispense:  180 each    Refill:  3  . gabapentin (NEURONTIN) 300 MG capsule    Sig: TAKE 1 CAPSULE BY MOUTH 2 TIMES DAILY.    Dispense:  60 capsule    Refill:  3  . levocetirizine (XYZAL) 5 MG tablet    Sig: Take 1 tablet (5 mg total) by mouth every evening.    Dispense:  30 tablet    Refill:  2  . meloxicam (MOBIC) 7.5 MG tablet    Sig: Take 1 tablet (7.5 mg total) by mouth daily.    Dispense:  30 tablet    Refill:  3  . traZODone (DESYREL) 50 MG tablet    Sig: Take 1 tablet (50 mg total) by mouth at bedtime as needed. for sleep    Dispense:  30 tablet    Refill:  3  . acetaminophen-codeine (TYLENOL #3) 300-30 MG tablet    Sig: Take 1 tablet by mouth every 12 (twelve) hours as needed for moderate pain.    Dispense:  60 tablet    Refill:  2  . lidocaine (LIDODERM) 5 %    Sig: Place 1 patch onto the skin daily. Remove & Discard patch within 12 hours or as directed by MD    Dispense:  30 patch    Refill:  3    Follow-up: Return in about 1 month (around 04/18/2018) for PAP smear.   Charlott Rakes MD

## 2018-03-18 NOTE — Patient Instructions (Signed)

## 2018-03-19 ENCOUNTER — Encounter: Payer: Self-pay | Admitting: Family Medicine

## 2018-04-01 ENCOUNTER — Other Ambulatory Visit: Payer: Self-pay | Admitting: Family Medicine

## 2018-04-01 DIAGNOSIS — G4709 Other insomnia: Secondary | ICD-10-CM

## 2018-04-01 DIAGNOSIS — R600 Localized edema: Secondary | ICD-10-CM

## 2018-04-01 MED FILL — $VENTOLIN HFA 18G INHALER: 108 (90 BAS | 75 days supply | Qty: 54 | Fill #3

## 2018-04-01 MED FILL — ACETAMINOPHEN/COD #3 TABLET: 300-30 | 30 days supply | Qty: 60 | Fill #1

## 2018-04-01 MED FILL — $ADVAIR 500/50MCG INHALER: 500-50 | 90 days supply | Qty: 180 | Fill #3

## 2018-04-01 MED FILL — MELOXICAM 7.5 MG TABLET: 7.5 | 30 days supply | Qty: 30 | Fill #2

## 2018-04-01 MED FILL — ?CLONIDINE HCL 0.2 MG TABLE: 0.2 | 30 days supply | Qty: 30 | Fill #0

## 2018-04-01 MED FILL — ?FUROSEMIDE 20 MG TABLET: 20 | 30 days supply | Qty: 30 | Fill #0

## 2018-04-01 MED FILL — GABAPENTIN 300 MG CAPSULE: 300 | 30 days supply | Qty: 60 | Fill #2

## 2018-04-01 MED FILL — CYCLOBENZAPRINE 10 MG TAB: 10 | 30 days supply | Qty: 60 | Fill #0

## 2018-04-14 MED FILL — LIDOCAINE PATCH 5%: 5 | 30 days supply | Qty: 30 | Fill #1

## 2018-04-24 ENCOUNTER — Encounter: Payer: Self-pay | Admitting: Family Medicine

## 2018-04-24 ENCOUNTER — Ambulatory Visit: Payer: Self-pay | Attending: Family Medicine | Admitting: Licensed Clinical Social Worker

## 2018-04-24 ENCOUNTER — Other Ambulatory Visit: Payer: Self-pay

## 2018-04-24 ENCOUNTER — Other Ambulatory Visit: Payer: Self-pay | Admitting: Family Medicine

## 2018-04-24 ENCOUNTER — Ambulatory Visit: Payer: Self-pay | Attending: Family Medicine | Admitting: Family Medicine

## 2018-04-24 VITALS — BP 108/75 | HR 84 | Temp 98.5°F | Resp 12 | Ht 61.0 in | Wt 205.0 lb

## 2018-04-24 DIAGNOSIS — F331 Major depressive disorder, recurrent, moderate: Secondary | ICD-10-CM

## 2018-04-24 DIAGNOSIS — Z01419 Encounter for gynecological examination (general) (routine) without abnormal findings: Secondary | ICD-10-CM | POA: Insufficient documentation

## 2018-04-24 DIAGNOSIS — F418 Other specified anxiety disorders: Secondary | ICD-10-CM

## 2018-04-24 DIAGNOSIS — H539 Unspecified visual disturbance: Secondary | ICD-10-CM | POA: Insufficient documentation

## 2018-04-24 DIAGNOSIS — Z124 Encounter for screening for malignant neoplasm of cervix: Secondary | ICD-10-CM

## 2018-04-24 DIAGNOSIS — R102 Pelvic and perineal pain unspecified side: Secondary | ICD-10-CM

## 2018-04-24 DIAGNOSIS — D573 Sickle-cell trait: Secondary | ICD-10-CM | POA: Insufficient documentation

## 2018-04-24 DIAGNOSIS — F419 Anxiety disorder, unspecified: Secondary | ICD-10-CM

## 2018-04-24 DIAGNOSIS — F17210 Nicotine dependence, cigarettes, uncomplicated: Secondary | ICD-10-CM | POA: Insufficient documentation

## 2018-04-24 NOTE — Progress Notes (Signed)
PAP Concerns with right eye vision

## 2018-04-24 NOTE — Progress Notes (Signed)
Subjective:    Patient ID: [name], female    DOB: 15-Jul-1971, 47 y.o.   MRN: [id number]  HPI 47 yo female who is seen as a schedule work-in to receive a pap smear. Per chart records, last pap was done 12/29/14 and was normal- no epithelial lesion. Patient did have bacterial vaginosis which was treated.  Patient reports new problems of visual disturbance in the right eye.  Patient states for approximately 3 months or more, when she is looking at bright lights or at street lights, she sees a Rainbow in her visual field.  Patient she did wear glasses in the past but has not worn glasses recently other than over-the-counter reading glasses.  Patient denies any double vision or blurred vision.  No loss of vision.  Patient also with complaint of vaginal dryness and pain with intercourse.  Patient states that this is been occurring for over 2 years and is getting worse.  Patient also stopped having her periods approximately 2 years ago.  Patient does have occasional hot flashes.  Patient also reports history of right ovarian cyst and has occasional right lower abdominal/pelvic discomfort.  Patient has had no vaginal bleeding or spotting.  Patient does not believe that she has any current vaginal discharge.  Patient reports family history significant for mother being diagnosed with colon cancer in her early 67s.  Patient reports that she has had a prior colonoscopy but is not sure how long ago this occurred.  Patient has had no blood in stool no black/dark stools.  Patient denies any changes in bowel habits.      Patient also states that she has had issues with anxiety and depression.  Patient states that she recently was placed on medication to help with her anxiety and depression.  Patient states that she is seeing a counselor and has an upcoming appointment with a psychiatrist at [facility].  Patient denies any suicidal thoughts or ideations.  Patient feels that her depression is related to  her issues with chronic pain related to back pain.  Patient reports she has been able to decrease her use of cigarettes to only 3/day. Past Medical History:  Diagnosis Date  . Allergy    pollen, pcn  . Asthma   . Depression   . Fibroids   . History of ovarian cyst   . Medical history non-contributory    sickle cell  . Menometrorrhagia   . 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 Sickle cell trait (HCC)    Family History  Problem Relation Age of Onset  . Cancer Mother        colon cancer  . Hypertension Mother   . Heart disease Father   . Cancer Father        cancer  . Other Neg Hx    Social History   Tobacco Use  . Smoking status: Current Every Day Smoker    Packs/day: 0.25    Types: Cigarettes    Last attempt to quit: 02/14/2016    Years since quitting: 2.1  . Smokeless tobacco: Never Used  . Tobacco comment: 1 cig daily 09/13/17  Substance Use Topics  . Alcohol use: No  . Drug use: No   Allergies  Allergen Reactions  . Bupropion Hcl Er (Sr) Hives and Other (See Comments)    The patient has an entire page of reactions that can be caused by the medication, but none that she has suffered.  Marland Kitchen  Fruit & Vegetable Daily [Nutritional Supplements] Other (See Comments)    "all fruits causes bumps and weird feeling in her mouth"  . Penicillins Hives  . Pollen Extract-Tree Extract Other (See Comments)    Allergies  . Other Rash    "bugs" roaches     Review of Systems  Constitutional: Positive for fatigue. Negative for chills and fever.  HENT: Negative for hearing loss, sore throat and trouble swallowing.   Respiratory: Negative for cough and shortness of breath.   Cardiovascular: Negative for palpitations and leg swelling.  Gastrointestinal: Negative for abdominal pain, blood in stool, constipation, diarrhea and nausea.  Genitourinary: Positive for frequency (states that she drinks a lot of water each day) and pelvic pain (has history of right ovarian cyst and  reports vaginal dryness and pain with intercourse since periods stopped 2 years ago). Negative for difficulty urinating, dysuria, vaginal bleeding and vaginal discharge.  Musculoskeletal: Positive for back pain (chronic) and gait problem.  Neurological: Negative for dizziness and headaches.  Psychiatric/Behavioral: Negative for agitation and confusion. The patient is nervous/anxious (reports that she was recently started on medication for anxiety and depression by [facility] and has follow-up).        Objective:   Physical Exam  Constitutional: She is oriented to person, place, and time. She appears well-developed and well-nourished. No distress.  Obese older female who is using a quad cane for balance/ambulation purposes.  HENT:  Head: Normocephalic and atraumatic.  Nose: Nose normal.  Mouth/Throat: Oropharynx is clear and moist.  TMs normal bilaterally  Eyes: Pupils are equal, round, and reactive to light. Conjunctivae and EOM are normal.  Neck: Neck supple. No thyromegaly present.  No bruits, range of motion is within normal with some mild decrease secondary to cervical paraspinous spasm  Cardiovascular: Normal rate and regular rhythm.  Pulmonary/Chest: Effort normal and breath sounds normal.  Abdominal: Soft. There is no tenderness. There is no guarding.  Truncal obesity  Genitourinary:  Genitourinary Comments: Patient with some discomfort with insertion of speculum into the vaginal canal.  Patient did have some mild, light yellow thin discharge in the posterior vault.  Cervix was normal in appearance.  No cervical motion tenderness.  Patient with some right adnexal discomfort to palpation.  Difficult to assess size of uterus secondary to body habitus.  Pap smear was performed at today's visit.  Musculoskeletal: She exhibits tenderness.  Patient with tenderness to palpation along the lumbosacral spine and SI joints.  Patient with marked lumbar paraspinous spasm.  Lymphadenopathy:    She  has no cervical adenopathy.  Neurological: She is alert and oriented to person, place, and time. No cranial nerve deficit.  Psychiatric: She has a normal mood and affect.   BP 108/75 (BP Location: Left Arm, Patient Position: Sitting, Cuff Size: Large)   Pulse 84   Temp 98.5 F (36.9 C) (Oral)   Resp 12   Ht 5\' 1"  (1.549 m)   Wt 205 lb (93 kg)   LMP  (Approximate) Comment: 55yrs ago  SpO2 98%   BMI 38.73 kg/m    Nursing note(which contained no content) and vital signs reviewed Assessment & Plan:  1. Screening for cervical cancer Pap smear done at today's visit.  Patient will be notified of the results and if any further follow-up or interventions needed based on these results. - Cytology - PAP  2. Visual disturbance Patient with complaint of visual disturbance in the right eye.  Patient will be referred to an eye care specialist  for further evaluation and treatment - Ambulatory referral to Ophthalmology  3. Adnexal pain Patient with presence of right sided adnexal pain on examination.  Patient will be scheduled for pelvic ultrasound.  Patient is advised to follow-up with her primary care physician in 4 to 6 weeks regarding her adnexal pain but sooner if worsening of pain or any concerns. - US Pelvis Complete; Future  4. Anxiety associated with depression Patient with an abnormal depression screen at today's visit and upon discussing the depression screen, patient indicated that she has recently been placed on medication to help with anxiety and depression and has follow-up with counseling and psychiatry at [facility] behavioral health.  Will also have patient seen today by medical social worker after her visit if possible and if not we will have medical social work to follow-up with patient regarding her anxiety and depression.

## 2018-04-24 NOTE — Patient Instructions (Signed)
Ovarian Cyst An ovarian cyst is a fluid-filled sac on an ovary. The ovaries are organs that make eggs in women. Most ovarian cysts go away on their own and are not cancerous (are benign). Some cysts need treatment. Follow these instructions at home:  Take over-the-counter and prescription medicines only as told by your doctor.  Do not drive or use heavy machinery while taking prescription pain medicine.  Get pelvic exams and Pap tests as often as told by your doctor.  Return to your normal activities as told by your doctor. Ask your doctor what activities are safe for you.  Do not use any products that contain nicotine or tobacco, such as cigarettes and e-cigarettes. If you need help quitting, ask your doctor.  Keep all follow-up visits as told by your doctor. This is important. Contact a doctor if:  Your periods are: ? Late. ? Irregular. ? Painful.  Your periods stop.  You have pelvic pain that does not go away.  You have pressure on your bladder.  You have trouble making your bladder empty when you pee (urinate).  You have pain during sex.  You have any of the following in your belly (abdomen): ? A feeling of fullness. ? Pressure. ? Discomfort. ? Pain that does not go away. ? Swelling.  You feel sick most of the time.  You have trouble pooping (have constipation).  You are not as hungry as usual (you lose your appetite).  You get very bad acne.  You start to have more hair on your body and face.  You are gaining weight or losing weight without changing your exercise and eating habits.  You think you may be pregnant. Get help right away if:  You have belly pain that is very bad or gets worse.  You cannot eat or drink without throwing up (vomiting).  You suddenly get a fever.  Your period is a lot heavier than usual. This information is not intended to replace advice given to you by your health care provider. Make sure you discuss any questions you have  with your health care provider. Document Released: 02/20/2008 Document Revised: 03/23/2016 Document Reviewed: 02/05/2016 Elsevier Interactive Patient Education  [employer].

## 2018-04-24 NOTE — BH Specialist Note (Signed)
Integrated Behavioral Health Initial Visit  MRN: [id number] Name: [name]  Number of [place] Clinician visits:: 1/6 Session Start time: 10:45 AM  Session End time: 11:15 AM Total time: 30 minutes  Type of Service: [place] Interpretor:No. Interpretor Name and Language: N/A   Warm Hand Off Completed.       SUBJECTIVE: [name] is a 47 y.o. female accompanied by self Patient was referred by Dr. Chapman Fitch for depression and anxiety. Patient reports the following symptoms/concerns: feelings of sadness and worry, difficulty sleeping, low energy/motivation, decreased concentration, restlessness, panic attacks, and irritability Duration of problem: Ongoing; Severity of problem: moderate  OBJECTIVE: Mood: Anxious and Depressed and Affect: Depressed Risk of harm to self or others: No plan to harm self or others  LIFE CONTEXT: Family and Social: Pt receives support from family School/Work: Pt has an appeal pending for disability. She was recently discharged from a lawyer and is in the process of obtaining another.  Self-Care: Pt participates in medication management and therapy through [facility] Life Changes: Pt has ongoing medical conditions and was recently discharged from a lawyer, regarding her appeal for disability.   GOALS ADDRESSED: Patient will: 1. Reduce symptoms of: anxiety, depression and stress 2. Increase knowledge and/or ability of: coping skills and self-management skills  3. Demonstrate ability to: Increase healthy adjustment to current life circumstances and Increase adequate support systems for patient/family  INTERVENTIONS: Interventions utilized: Mindfulness or Psychologist, educational, Supportive Counseling and Link to [employer]  Standardized Assessments completed: GAD-7 and PHQ 2&9  ASSESSMENT: Patient currently experiencing depression and anxiety triggered by ongoing medical conditions,  financial strain, and limited support regarding appeal for disability. She reports feelings of sadness and worry, difficulty sleeping, low energy/motivation, decreased concentration, restlessness, panic attacks, and irritability. Pt denies SI/HI/AVH and receives support from family.   Patient currently receives medication management and psychotherapy through [facility]. Mindfulness strategies were discussed to promote positive feelings and decrease symptoms. Transportation assistance was provided, in addition, to SCAT application.   PLAN: 1. Follow up with behavioral health clinician on : Pt was encouraged to contact LCSWA if symptoms worsen or fail to improve to schedule behavioral appointments at [facility]. 2. Behavioral recommendations: LCSWA recommends that pt apply healthy coping skills discussed, comply with services at [facility], and utilize provided resources.  3. Referral(s): Community Resources:  Transportation 4. "From scale of 1-10, how likely are you to follow plan?":   Rebekah Chesterfield, LCSW 04/25/18 4:11 PM

## 2018-04-28 ENCOUNTER — Ambulatory Visit (HOSPITAL_COMMUNITY): Admission: RE | Admit: 2018-04-28 | Payer: Self-pay | Source: Ambulatory Visit

## 2018-04-29 LAB — CYTOLOGY - PAP
Diagnosis: NEGATIVE
HPV: NOT DETECTED

## 2018-04-30 ENCOUNTER — Telehealth: Payer: Self-pay

## 2018-04-30 ENCOUNTER — Ambulatory Visit: Payer: Self-pay | Attending: Family Medicine | Admitting: Licensed Clinical Social Worker

## 2018-04-30 ENCOUNTER — Ambulatory Visit: Payer: Self-pay | Attending: Family Medicine | Admitting: Physician Assistant

## 2018-04-30 VITALS — BP 135/85 | HR 73 | Temp 98.3°F | Resp 18 | Ht 61.0 in | Wt 205.0 lb

## 2018-04-30 DIAGNOSIS — Z79899 Other long term (current) drug therapy: Secondary | ICD-10-CM | POA: Insufficient documentation

## 2018-04-30 DIAGNOSIS — D571 Sickle-cell disease without crisis: Secondary | ICD-10-CM | POA: Insufficient documentation

## 2018-04-30 DIAGNOSIS — Z880 Allergy status to penicillin: Secondary | ICD-10-CM | POA: Insufficient documentation

## 2018-04-30 DIAGNOSIS — Z888 Allergy status to other drugs, medicaments and biological substances status: Secondary | ICD-10-CM | POA: Insufficient documentation

## 2018-04-30 DIAGNOSIS — Z9109 Other allergy status, other than to drugs and biological substances: Secondary | ICD-10-CM | POA: Insufficient documentation

## 2018-04-30 DIAGNOSIS — K047 Periapical abscess without sinus: Secondary | ICD-10-CM | POA: Insufficient documentation

## 2018-04-30 DIAGNOSIS — Z91018 Allergy to other foods: Secondary | ICD-10-CM | POA: Insufficient documentation

## 2018-04-30 DIAGNOSIS — Z91038 Other insect allergy status: Secondary | ICD-10-CM | POA: Insufficient documentation

## 2018-04-30 DIAGNOSIS — F329 Major depressive disorder, single episode, unspecified: Secondary | ICD-10-CM | POA: Insufficient documentation

## 2018-04-30 DIAGNOSIS — D573 Sickle-cell trait: Secondary | ICD-10-CM | POA: Insufficient documentation

## 2018-04-30 DIAGNOSIS — J45909 Unspecified asthma, uncomplicated: Secondary | ICD-10-CM | POA: Insufficient documentation

## 2018-04-30 DIAGNOSIS — F331 Major depressive disorder, recurrent, moderate: Secondary | ICD-10-CM

## 2018-04-30 MED ORDER — CLINDAMYCIN HCL 300 MG PO CAPS: 300.0000 mg | ORAL_CAPSULE | Freq: Three times a day (TID) | ORAL | 0 refills | Status: DC

## 2018-04-30 MED ORDER — FLUCONAZOLE 150 MG PO TABS: 150.0000 mg | ORAL_TABLET | Freq: Once | ORAL | 0 refills | Status: AC

## 2018-04-30 MED FILL — GABAPENTIN 300 MG CAPSULE: 300 | 30 days supply | Qty: 60 | Fill #3

## 2018-04-30 MED FILL — FLUCONAZOLE 150 MG TABS: 150 | 1 days supply | Qty: 1 | Fill #0

## 2018-04-30 MED FILL — CYCLOBENZAPRINE 10 MG TAB: 10 | 30 days supply | Qty: 60 | Fill #1

## 2018-04-30 MED FILL — MELOXICAM 7.5 MG TABLET: 7.5 | 30 days supply | Qty: 30 | Fill #3

## 2018-04-30 MED FILL — ACETAMINOPHEN/COD #3 TABLET: 300-30 | 30 days supply | Qty: 60 | Fill #2

## 2018-04-30 MED FILL — ?CLONIDINE HCL 0.2 MG TABLE: 0.2 | 30 days supply | Qty: 30 | Fill #1

## 2018-04-30 MED FILL — CLINDAMYCIN HCL 300 MG CAP: 300 | 10 days supply | Qty: 30 | Fill #0

## 2018-04-30 MED FILL — ?FUROSEMIDE 20 MG TABLET: 20 | 30 days supply | Qty: 30 | Fill #1

## 2018-04-30 NOTE — BH Specialist Note (Signed)
Integrated Behavioral Health Initial Visit  MRN: [id number] Name: [name]  Number of [place] Clinician visits:: 2/6 Session Start time: 2:35 PM  Session End time: 2:45 PM Total time: 10 minutes  Type of Service: [place] Interpretor:No. Interpretor Name and Language: N/A   Warm Hand Off Completed.       SUBJECTIVE: [name] is a 47 y.o. female accompanied by self Patient was referred by Weyman Pedro for transportation. Patient reports the following symptoms/concerns: feelings of sadness and worry, difficulty sleeping, low energy/motivation, decreased concentration, restlessness, panic attacks, and irritability Duration of problem: Ongoing; Severity of problem: moderate  OBJECTIVE: Mood: Dysphoric and Affect: Depressed Risk of harm to self or others: No plan to harm self or others  LIFE CONTEXT: Family and Social: Pt receives support from family and fiance School/Work: Pt has an appeal pending for disability. She was recently discharged from a lawyer and is in the process of obtaining another.  Self-Care: Pt participates in medication management and therapy through [facility] Life Changes: Pt has ongoing medical conditions and was recently discharged from a lawyer, regarding her appeal for disability. She is currently experiencing pain and requested assistance with transportation  GOALS ADDRESSED: Patient will: 1. Reduce symptoms of: anxiety, depression and stress 2. Increase knowledge and/or ability of: self-management skills  3. Demonstrate ability to: Increase adequate support systems for patient/family  INTERVENTIONS: Interventions utilized: Solution-Focused Strategies  Standardized Assessments completed: GAD-7 and PHQ 2&9  ASSESSMENT: Patient currently experiencing depression and anxiety triggered by ongoing medical conditions, financial strain, and limited support regarding appeal for disability.  She reports pain from a tooth abscess.   LCSWA discussed benefits of utilizing her support system to assist her during this time. Pt verbalized understanding and shared that she has expressed needs to fiancee who agreed to assist her. Transportation assistance was provided.   PLAN: 1. Follow up with behavioral health clinician on : Pt was encouraged to contact LCSWA if symptoms worsen or fail to improve to schedule behavioral appointments at [facility]. 2. Behavioral recommendations: LCSWA recommends that pt apply healthy coping skills discussed, comply with services at [facility], and utilize provided resources.  3. Referral(s): Community Resources:  Transportation 4. "From scale of 1-10, how likely are you to follow plan?":   Rebekah Chesterfield, LCSW 05/02/18 10:31 AM

## 2018-04-30 NOTE — Progress Notes (Signed)
Patient ID: [name], female   DOB: 05/06/1971, 47 y.o.   MRN: [id number]   [name], is a 47 y.o. female  ZWC:[id number]  MPN:[id number]  DOB - Nov 04, 1970  Subjective:  Chief Complaint and HPI: Taylormarie Mason is a 47 y.o. female here today for tooth pain/broken teeth for 4 days.  Needs referral for dentist. No f/c.    ROS:   Constitutional:  No f/c, No night sweats, No unexplained weight loss. EENT:  No vision changes, No blurry vision, No hearing changes. No additional mouth, throat, or ear problems.  Respiratory: No cough, No SOB Cardiac: No CP, no palpitations GI:  No abd pain, No N/V/D. GU: No Urinary s/sx Musculoskeletal: No joint pain Neuro: No headache, no dizziness, no motor weakness.  Skin: No rash Endocrine:  No polydipsia. No polyuria.  Psych: Denies SI/HI  No problems updated.  ALLERGIES: Allergies  Allergen Reactions  . Bupropion Hcl Er (Sr) Hives and Other (See Comments)    The patient has an entire page of reactions that can be caused by the medication, but none that she has suffered.  . Fruit & Vegetable Daily [Nutritional Supplements] Other (See Comments)    "all fruits causes bumps and weird feeling in her mouth"  . Penicillins Hives  . Pollen Extract-Tree Extract Other (See Comments)    Allergies  . Other Rash    "bugs" roaches    PAST MEDICAL HISTORY: Past Medical History:  Diagnosis Date  . Allergy    pollen, pcn  . Asthma   . Depression   . Fibroids   . History of ovarian cyst   . Medical history non-contributory    sickle cell  . Menometrorrhagia   . 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 Sickle cell trait ([place])     MEDICATIONS AT HOME: Prior to Admission medications   Medication Sig Start Date End Date Taking? Authorizing Provider  acetaminophen-codeine (TYLENOL #3) 300-30 MG tablet Take 1 tablet by mouth every 12 (twelve) hours as needed for moderate pain. 03/18/18  Yes Charlott Rakes, MD  albuterol (VENTOLIN HFA)  108 (90 Base) MCG/ACT inhaler Inhale 2 puffs into the lungs every 6 (six) hours as needed for wheezing or shortness of breath. 03/18/18  Yes Newlin, Charlane Ferretti, MD  busPIRone (BUSPAR) 15 MG tablet Take 1 tablet (15 mg total) by mouth daily. 03/18/18  Yes Charlott Rakes, MD  cariprazine (VRAYLAR) capsule Take by mouth daily.   Yes [provider]  cloNIDine (CATAPRES) 0.2 MG tablet Take 1 tablet (0.2 mg total) by mouth at bedtime. 03/18/18  Yes Charlott Rakes, MD  clotrimazole (LOTRIMIN) 1 % cream Apply 1 application topically 2 (two) times daily. 09/13/17  Yes Newlin, Charlane Ferretti, MD  cyclobenzaprine (FLEXERIL) 10 MG tablet TAKE 1 TABLET BY MOUTH 2 TIMES DAILY AS NEEDED FOR MUSCLE SPASMS. 12/12/17  Yes Charlott Rakes, MD  FLUoxetine (PROZAC) 40 MG capsule Take 1 capsule (40 mg total) by mouth daily. 03/18/18  Yes Newlin, Charlane Ferretti, MD  Fluticasone-Salmeterol (ADVAIR DISKUS) 500-50 MCG/DOSE AEPB Inhale 1 puff into the lungs 2 (two) times daily. 03/18/18  Yes Newlin, Charlane Ferretti, MD  furosemide (LASIX) 20 MG tablet TAKE 1 TABLET BY MOUTH DAILY AS NEEDED FOR PEDAL EDEMA 04/01/18  Yes Newlin, Enobong, MD  gabapentin (NEURONTIN) 300 MG capsule TAKE 1 CAPSULE BY MOUTH 2 TIMES DAILY. 03/18/18  Yes Charlott Rakes, MD  lactulose (CHRONULAC) 10 GM/15ML solution Take 15 mLs (10 g total) by mouth 2 (two) times  daily as needed for mild constipation. 12/12/17  Yes Charlott Rakes, MD  levocetirizine (XYZAL) 5 MG tablet Take 1 tablet (5 mg total) by mouth every evening. 03/18/18  Yes Newlin, Charlane Ferretti, MD  lidocaine (LIDODERM) 5 % Place 1 patch onto the skin daily. Remove & Discard patch within 12 hours or as directed by MD 03/18/18  Yes Charlott Rakes, MD  meloxicam (MOBIC) 7.5 MG tablet Take 1 tablet (7.5 mg total) by mouth daily. 03/18/18  Yes Charlott Rakes, MD  traZODone (DESYREL) 50 MG tablet Take 1 tablet (50 mg total) by mouth at bedtime as needed. for sleep 03/18/18  Yes Newlin, Enobong, MD  triamcinolone cream (KENALOG) 0.1 %  APPLY 1 APPLICATION TOPICALLY 2 (TWO) TIMES DAILY. 02/04/18  Yes Charlott Rakes, MD  clindamycin (CLEOCIN) 300 MG capsule Take 1 capsule (300 mg total) by mouth 3 (three) times daily. 04/30/18   Argentina Donovan, PA-C  fluconazole (DIFLUCAN) 150 MG tablet Take 1 tablet (150 mg total) by mouth once for 1 dose. 04/30/18 04/30/18  Argentina Donovan, PA-C     Objective:  EXAM:   Vitals:   04/30/18 1356  BP: 135/85  Pulse: 73  Resp: 18  Temp: 98.3 F (36.8 C)  TempSrc: Oral  SpO2: 98%  Weight: 205 lb (93 kg)  Height: 5\' 1"  (1.549 m)    General appearance : A&OX3. NAD. Non-toxic-appearing HEENT: Atraumatic and Normocephalic.  PERRLA. EOM intact.  Poor dentition, multiple dental caries and broken teeth.  Lower L back with swelling and erythema and TTP.  No fluctuance, but there is concern for abscess.  Neck: supple, no JVD. No cervical lymphadenopathy. No thyromegaly Chest/Lungs:  Breathing-non-labored, Good air entry bilaterally, breath sounds normal without rales, rhonchi, or wheezing  CVS: S1 S2 regular, no murmurs, gallops, rubs  Extremities: Bilateral Lower Ext shows no edema, both legs are warm to touch with = pulse throughout Neurology:  CN II-XII grossly intact, Non focal.   Psych:  TP linear. J/I WNL. Normal speech. Appropriate eye contact and affect.  Skin:  No Rash  Data Review Lab Results  Component Value Date   HGBA1C 4.8 12/12/2017   HGBA1C 4.6 02/20/2016   HGBA1C 4.9 12/29/2014     Assessment & Plan   1. Tooth abscess - clindamycin (CLEOCIN) 300 MG capsule; Take 1 capsule (300 mg total) by mouth 3 (three) times daily.  Dispense: 30 capsule; Refill: 0 - Ambulatory referral to Dentistry - fluconazole (DIFLUCAN) 150 MG tablet; Take 1 tablet (150 mg total) by mouth once for 1 dose.  Dispense: 1 tablet; Refill: 0 -salt water gargles tid   Patient have been counseled extensively about nutrition and exercise  Return for keep 06/11/2018 appt .  The patient was given  clear instructions to go to ER or return to medical center if symptoms don't improve, worsen or new problems develop. The patient verbalized understanding. The patient was told to call to get lab results if they haven't heard anything in the next week.     Freeman Caldron, PA-C [facility], [place]   04/30/2018, 2:14 PM

## 2018-04-30 NOTE — Telephone Encounter (Signed)
-----   Message from Antony Blackbird, MD sent at 04/29/2018  5:13 PM EDT ----- Please notify patient that her recent pap smear was normal

## 2018-04-30 NOTE — Telephone Encounter (Signed)
Call was placed to patient, patient answered and verified dob. Patient was informed and verbalized understanding of most recent lab results. Patient had no further questions for the results but patient asked for an appointment for toothache, cma made patient an appointment for her toothache.

## 2018-06-03 ENCOUNTER — Other Ambulatory Visit: Payer: Self-pay | Admitting: Family Medicine

## 2018-06-03 DIAGNOSIS — L308 Other specified dermatitis: Secondary | ICD-10-CM

## 2018-06-03 MED FILL — traZODone HCL 100 MG TABS: 100 | 60 days supply | Qty: 60 | Fill #0

## 2018-06-03 MED FILL — TRIAMCINOLONE 0.1% CREAM: 0.1 | 7 days supply | Qty: 30 | Fill #0

## 2018-06-03 MED FILL — GABAPENTIN 300 MG CAPSULE: 300 | 30 days supply | Qty: 60 | Fill #0

## 2018-06-03 MED FILL — MELOXICAM 7.5 MG TABLET: 7.5 | 30 days supply | Qty: 30 | Fill #0

## 2018-06-03 MED FILL — FUROSEMIDE 20 MG TABLET: 20 | 30 days supply | Qty: 30 | Fill #2

## 2018-06-03 MED FILL — cloNIDine HCL 0.2 MG TABS: 0.2 | 30 days supply | Qty: 30 | Fill #2

## 2018-06-03 MED FILL — ACETAMINOPHEN/COD #3 TABLET: 300-30 | 30 days supply | Qty: 60 | Fill #0

## 2018-06-03 MED FILL — FLUoxetine HCL 40 MG CAPS: 40 | 30 days supply | Qty: 30 | Fill #0

## 2018-06-03 MED FILL — ?LIDOCAINE 5% PATCH: 5 | 30 days supply | Qty: 30 | Fill #2

## 2018-06-03 MED FILL — busPIRone HCL 15 MG TABS: 15 | 60 days supply | Qty: 120 | Fill #0

## 2018-06-03 MED FILL — CYCLOBENZAPRINE 10 MG TAB: 10 | 30 days supply | Qty: 60 | Fill #0

## 2018-06-11 ENCOUNTER — Encounter: Payer: Self-pay | Admitting: Family Medicine

## 2018-06-11 ENCOUNTER — Ambulatory Visit: Payer: Self-pay | Attending: Family Medicine | Admitting: Family Medicine

## 2018-06-11 VITALS — BP 107/74 | HR 78 | Temp 98.0°F | Ht 61.0 in | Wt 212.8 lb

## 2018-06-11 DIAGNOSIS — F419 Anxiety disorder, unspecified: Secondary | ICD-10-CM | POA: Insufficient documentation

## 2018-06-11 DIAGNOSIS — M5010 Cervical disc disorder with radiculopathy, unspecified cervical region: Secondary | ICD-10-CM | POA: Insufficient documentation

## 2018-06-11 DIAGNOSIS — Z23 Encounter for immunization: Secondary | ICD-10-CM

## 2018-06-11 DIAGNOSIS — Z880 Allergy status to penicillin: Secondary | ICD-10-CM | POA: Insufficient documentation

## 2018-06-11 DIAGNOSIS — Z79899 Other long term (current) drug therapy: Secondary | ICD-10-CM | POA: Insufficient documentation

## 2018-06-11 DIAGNOSIS — J4520 Mild intermittent asthma, uncomplicated: Secondary | ICD-10-CM | POA: Insufficient documentation

## 2018-06-11 DIAGNOSIS — R232 Flushing: Secondary | ICD-10-CM

## 2018-06-11 DIAGNOSIS — F331 Major depressive disorder, recurrent, moderate: Secondary | ICD-10-CM | POA: Insufficient documentation

## 2018-06-11 DIAGNOSIS — G8929 Other chronic pain: Secondary | ICD-10-CM | POA: Insufficient documentation

## 2018-06-11 DIAGNOSIS — I10 Essential (primary) hypertension: Secondary | ICD-10-CM | POA: Insufficient documentation

## 2018-06-11 DIAGNOSIS — K5909 Other constipation: Secondary | ICD-10-CM | POA: Insufficient documentation

## 2018-06-11 DIAGNOSIS — Z791 Long term (current) use of non-steroidal anti-inflammatories (NSAID): Secondary | ICD-10-CM | POA: Insufficient documentation

## 2018-06-11 DIAGNOSIS — N951 Menopausal and female climacteric states: Secondary | ICD-10-CM | POA: Insufficient documentation

## 2018-06-11 DIAGNOSIS — M5134 Other intervertebral disc degeneration, thoracic region: Secondary | ICD-10-CM | POA: Insufficient documentation

## 2018-06-11 DIAGNOSIS — M4802 Spinal stenosis, cervical region: Secondary | ICD-10-CM | POA: Insufficient documentation

## 2018-06-11 DIAGNOSIS — M48062 Spinal stenosis, lumbar region with neurogenic claudication: Secondary | ICD-10-CM | POA: Insufficient documentation

## 2018-06-11 DIAGNOSIS — D573 Sickle-cell trait: Secondary | ICD-10-CM | POA: Insufficient documentation

## 2018-06-11 MED ORDER — GABAPENTIN 300 MG PO CAPS: 600.0000 mg | ORAL_CAPSULE | Freq: Two times a day (BID) | ORAL | 3 refills | Status: DC

## 2018-06-11 MED ORDER — LIDOCAINE 5 % EX PTCH: 1.0000 | MEDICATED_PATCH | CUTANEOUS | 3 refills | Status: DC

## 2018-06-11 MED ORDER — CLONIDINE HCL 0.2 MG PO TABS: 0.2000 mg | ORAL_TABLET | Freq: Every day | ORAL | 3 refills | Status: DC

## 2018-06-11 MED ORDER — MELOXICAM 7.5 MG PO TABS: 7.5000 mg | ORAL_TABLET | Freq: Every day | ORAL | 3 refills | Status: DC

## 2018-06-11 MED ORDER — ACETAMINOPHEN-CODEINE #3 300-30 MG PO TABS: 1.0000 | ORAL_TABLET | Freq: Two times a day (BID) | ORAL | 2 refills | Status: DC | PRN

## 2018-06-11 MED ORDER — BUSPIRONE HCL 15 MG PO TABS: 15.0000 mg | ORAL_TABLET | Freq: Every day | ORAL | 3 refills | Status: DC

## 2018-06-11 MED ORDER — CYCLOBENZAPRINE HCL 10 MG PO TABS: ORAL_TABLET | ORAL | 3 refills | Status: DC

## 2018-06-11 MED FILL — GABAPENTIN 300 MG CAPSULE: 300 | 30 days supply | Qty: 120 | Fill #0

## 2018-06-11 NOTE — Progress Notes (Signed)
Subjective:  Patient ID: [name], female    DOB: 06/01/1971  Age: 47 y.o. MRN: [id number]  CC: Back Pain   HPI [name] is a 47 year old female with a history of asthma, hypertension, depression, chronic low back pain secondary to spinal stenosis, degenerative disc disease of the thoracic spine, cervical stenosis, hot flashes, chronic constipation who presents today for a follow-up visit. Her low back pain is uncontrolled on Tylenol 3, gabapentin and Flexeril and she experiences radiation of pain down her lower extremities bilaterally.  She has no medical coverage and is unable to see pain management.  Denies loss of sphincteric function and ambulates with a cane. At a previous visit she had complained of adnexal pain but informs me today that has resolved. Her asthma has been controlled with no recent exacerbation and she is tolerating her antihypertensive. She receives mental health care at [facility] where her depression is managed.  Past Medical History:  Diagnosis Date  . Allergy    pollen, pcn  . Asthma   . Depression   . Fibroids   . History of ovarian cyst   . Medical history non-contributory    sickle cell  . Menometrorrhagia   . 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 Sickle cell trait [facility])     Past Surgical History:  Procedure Laterality Date  . CESAREAN SECTION     all three pregnancies    Allergies  Allergen Reactions  . Bupropion Hcl Er (Sr) Hives and Other (See Comments)    The patient has an entire page of reactions that can be caused by the medication, but none that she has suffered.  . Fruit & Vegetable Daily [Nutritional Supplements] Other (See Comments)    "all fruits causes bumps and weird feeling in her mouth"  . Penicillins Hives  . Pollen Extract-Tree Extract Other (See Comments)    Allergies  . Other Rash    "bugs" roaches     Outpatient Medications Prior to Visit  Medication Sig Dispense Refill  . albuterol (VENTOLIN  HFA) 108 (90 Base) MCG/ACT inhaler Inhale 2 puffs into the lungs every 6 (six) hours as needed for wheezing or shortness of breath. 54 g 3  . cariprazine (VRAYLAR) capsule Take by mouth daily.    . clotrimazole (LOTRIMIN) 1 % cream Apply 1 application topically 2 (two) times daily. 45 g 1  . FLUoxetine (PROZAC) 40 MG capsule Take 1 capsule (40 mg total) by mouth daily. 30 capsule 1  . Fluticasone-Salmeterol (ADVAIR DISKUS) 500-50 MCG/DOSE AEPB Inhale 1 puff into the lungs 2 (two) times daily. 180 each 3  . furosemide (LASIX) 20 MG tablet TAKE 1 TABLET BY MOUTH DAILY AS NEEDED FOR PEDAL EDEMA 30 tablet 2  . lactulose (CHRONULAC) 10 GM/15ML solution Take 15 mLs (10 g total) by mouth 2 (two) times daily as needed for mild constipation. 946 mL 1  . levocetirizine (XYZAL) 5 MG tablet Take 1 tablet (5 mg total) by mouth every evening. 30 tablet 2  . traZODone (DESYREL) 50 MG tablet Take 1 tablet (50 mg total) by mouth at bedtime as needed. for sleep 30 tablet 3  . triamcinolone cream (KENALOG) 0.1 % APPLY TOPICALLY 2 (TWO) TIMES DAILY. 30 g 1  . acetaminophen-codeine (TYLENOL #3) 300-30 MG tablet Take 1 tablet by mouth every 12 (twelve) hours as needed for moderate pain. 60 tablet 2  . busPIRone (BUSPAR) 15 MG tablet Take 1 tablet (15 mg total)  by mouth daily. 30 tablet 1  . clindamycin (CLEOCIN) 300 MG capsule Take 1 capsule (300 mg total) by mouth 3 (three) times daily. 30 capsule 0  . cloNIDine (CATAPRES) 0.2 MG tablet Take 1 tablet (0.2 mg total) by mouth at bedtime. 30 tablet 3  . cyclobenzaprine (FLEXERIL) 10 MG tablet TAKE 1 TABLET BY MOUTH 2 TIMES DAILY AS NEEDED FOR MUSCLE SPASMS. 60 tablet 3  . gabapentin (NEURONTIN) 300 MG capsule TAKE 1 CAPSULE BY MOUTH 2 TIMES DAILY. 60 capsule 3  . lidocaine (LIDODERM) 5 % Place 1 patch onto the skin daily. Remove & Discard patch within 12 hours or as directed by MD 30 patch 3  . meloxicam (MOBIC) 7.5 MG tablet Take 1 tablet (7.5 mg total) by mouth daily. 30  tablet 3   No facility-administered medications prior to visit.     ROS Review of Systems  Constitutional: Negative for activity change, appetite change and fatigue.  HENT: Negative for congestion, sinus pressure and sore throat.   Eyes: Negative for visual disturbance.  Respiratory: Negative for cough, chest tightness, shortness of breath and wheezing.   Cardiovascular: Negative for chest pain and palpitations.  Gastrointestinal: Negative for abdominal distention, abdominal pain and constipation.  Endocrine: Negative for polydipsia.  Genitourinary: Negative for dysuria and frequency.  Musculoskeletal: Negative for arthralgias and back pain.  Skin: Negative for rash.  Neurological: Negative for tremors, light-headedness and numbness.  Hematological: Does not bruise/bleed easily.  Psychiatric/Behavioral: Negative for agitation and behavioral problems.    Objective:  BP 107/74   Pulse 78   Temp 98 F (36.7 C) (Oral)   Ht 5\' 1"  (1.549 m)   Wt 212 lb 12.8 oz (96.5 kg)   SpO2 100%   BMI 40.21 kg/m   BP/Weight 06/11/2018 01/23/3266 09/18/4578  Systolic BP 998 338 250  Diastolic BP 74 85 75  Wt. (Lbs) 212.8 205 205  BMI 40.21 38.73 38.73  Some encounter information is confidential and restricted. Go to Review Flowsheets activity to see all data.      Physical Exam  Constitutional: She is oriented to person, place, and time. She appears well-developed and well-nourished.  Cardiovascular: Normal rate, normal heart sounds and intact distal pulses.  No murmur heard. Pulmonary/Chest: Effort normal and breath sounds normal. She has no wheezes. She has no rales. She exhibits no tenderness.  Abdominal: Soft. Bowel sounds are normal. She exhibits no distension and no mass. There is no tenderness.  Musculoskeletal:  Tenderness to slight palpation on entire thoracic lumbar spine and bilateral aspects of both spine. Positive straight leg raise bilaterally  Neurological: She is alert and  oriented to person, place, and time.  Skin: Skin is warm and dry.  Psychiatric: She has a normal mood and affect.     Assessment & Plan:   1. Spinal stenosis, lumbar region, with neurogenic claudication Uncontrolled Referred to rehab - gabapentin (NEURONTIN) 300 MG capsule; Take 2 capsules (600 mg total) by mouth 2 (two) times daily. TAKE 1 CAPSULE BY MOUTH 2 TIMES DAILY.  Dispense: 120 capsule; Refill: 3 - Drug Screen 12+Alcohol+CRT, Ur - acetaminophen-codeine (TYLENOL #3) 300-30 MG tablet; Take 1 tablet by mouth every 12 (twelve) hours as needed for moderate pain.  Dispense: 60 tablet; Refill: 2 - cyclobenzaprine (FLEXERIL) 10 MG tablet; TAKE 1 TABLET BY MOUTH 2 TIMES DAILY AS NEEDED FOR MUSCLE SPASMS.  Dispense: 60 tablet; Refill: 3 - lidocaine (LIDODERM) 5 %; Place 1 patch onto the skin daily. Remove & Discard patch  within 12 hours or as directed by MD  Dispense: 30 patch; Refill: 3 - meloxicam (MOBIC) 7.5 MG tablet; Take 1 tablet (7.5 mg total) by mouth daily.  Dispense: 30 tablet; Refill: 3  2. Anxiety Controlled - busPIRone (BUSPAR) 15 MG tablet; Take 1 tablet (15 mg total) by mouth daily.  Dispense: 30 tablet; Refill: 3  3. Hot flashes Stable - cloNIDine (CATAPRES) 0.2 MG tablet; Take 1 tablet (0.2 mg total) by mouth at bedtime.  Dispense: 30 tablet; Refill: 3  4. Cervical disc disorder with radiculopathy of cervical region Uncontrolled Refer to rehab - cyclobenzaprine (FLEXERIL) 10 MG tablet; TAKE 1 TABLET BY MOUTH 2 TIMES DAILY AS NEEDED FOR MUSCLE SPASMS.  Dispense: 60 tablet; Refill: 3  5. Moderate episode of recurrent major depressive disorder (HCC) Stable Followed by mental health  6. Mild intermittent asthma without complication No recent exacerbation   Meds ordered this encounter  Medications  . gabapentin (NEURONTIN) 300 MG capsule    Sig: Take 2 capsules (600 mg total) by mouth 2 (two) times daily. TAKE 1 CAPSULE BY MOUTH 2 TIMES DAILY.    Dispense:  120  capsule    Refill:  3  . acetaminophen-codeine (TYLENOL #3) 300-30 MG tablet    Sig: Take 1 tablet by mouth every 12 (twelve) hours as needed for moderate pain.    Dispense:  60 tablet    Refill:  2  . busPIRone (BUSPAR) 15 MG tablet    Sig: Take 1 tablet (15 mg total) by mouth daily.    Dispense:  30 tablet    Refill:  3  . cloNIDine (CATAPRES) 0.2 MG tablet    Sig: Take 1 tablet (0.2 mg total) by mouth at bedtime.    Dispense:  30 tablet    Refill:  3    Discontinue previous dose  . cyclobenzaprine (FLEXERIL) 10 MG tablet    Sig: TAKE 1 TABLET BY MOUTH 2 TIMES DAILY AS NEEDED FOR MUSCLE SPASMS.    Dispense:  60 tablet    Refill:  3  . lidocaine (LIDODERM) 5 %    Sig: Place 1 patch onto the skin daily. Remove & Discard patch within 12 hours or as directed by MD    Dispense:  30 patch    Refill:  3  . meloxicam (MOBIC) 7.5 MG tablet    Sig: Take 1 tablet (7.5 mg total) by mouth daily.    Dispense:  30 tablet    Refill:  3    Follow-up: Return in about 3 months (around 09/10/2018) for Follow-up of chronic medical conditions.   Charlott Rakes MD

## 2018-06-11 NOTE — Patient Instructions (Signed)

## 2018-06-18 LAB — DRUG SCREEN 12+ALCOHOL+CRT, UR
AMPHETAMINES, URINE: NEGATIVE ng/mL
BARBITURATE: NEGATIVE ng/mL
BENZODIAZ UR QL: NEGATIVE ng/mL
CREATININE, RANDOM U: 126.1 mg/dL (ref 20.0–300.0)
Cannabinoids: NEGATIVE ng/mL
Cocaine (Metabolite): NEGATIVE ng/mL
Ethanol, Urine: NEGATIVE %
Meperidine: NEGATIVE ng/mL
Methadone: NEGATIVE ng/mL
OPIATE SCREEN URINE: POSITIVE ng/mL — AB
Oxycodone/Oxymorphone, Urine: NEGATIVE ng/mL
PROPOXYPHENE: NEGATIVE ng/mL
Phencyclidine: NEGATIVE ng/mL
TRAMADOL: NEGATIVE ng/mL

## 2018-06-19 ENCOUNTER — Telehealth: Payer: Self-pay

## 2018-06-19 NOTE — Telephone Encounter (Signed)
-----   Message from Charlott Rakes, MD sent at 06/19/2018  8:53 AM EDT ----- Urine drug screen is in keeping with prescribed medication

## 2018-06-19 NOTE — Telephone Encounter (Signed)
Patient was called and there was no voicemail to pick up.   If patient calls for lab results please inform her of lab results below.

## 2018-06-30 ENCOUNTER — Other Ambulatory Visit: Payer: Self-pay | Admitting: Family Medicine

## 2018-06-30 DIAGNOSIS — R600 Localized edema: Secondary | ICD-10-CM

## 2018-06-30 MED FILL — LIDOCAINE PATCH 5%: 5 | 30 days supply | Qty: 30 | Fill #3

## 2018-06-30 MED FILL — FLUoxetine HCL 40 MG CAPS: 40 | 30 days supply | Qty: 30 | Fill #1

## 2018-06-30 MED FILL — LACTULOSE 10 GM/15 ML SOLN: 10 | 31 days supply | Qty: 946 | Fill #0

## 2018-06-30 MED FILL — ACETAMINOPHEN/COD #3 TABLET: 300-30 | 30 days supply | Qty: 60 | Fill #1

## 2018-06-30 MED FILL — $ADVAIR 500/50MCG INHALER: 500-50 | 90 days supply | Qty: 180 | Fill #4

## 2018-06-30 MED FILL — FUROSEMIDE 20 MG TABLET: 20 | 30 days supply | Qty: 30 | Fill #0

## 2018-06-30 MED FILL — CYCLOBENZAPRINE 10 MG TAB: 10 | 30 days supply | Qty: 60 | Fill #1

## 2018-06-30 MED FILL — cloNIDine HCL 0.2 MG TABS: 0.2 | 30 days supply | Qty: 30 | Fill #3

## 2018-06-30 MED FILL — MELOXICAM 7.5 MG TABLET: 7.5 | 30 days supply | Qty: 30 | Fill #1

## 2018-06-30 MED FILL — $VENTOLIN HFA 18G INHALER: 108 (90 BAS | 75 days supply | Qty: 54 | Fill #4

## 2018-06-30 MED FILL — GABAPENTIN 300 MG CAPSULE: 300 | 30 days supply | Qty: 60 | Fill #1

## 2018-08-05 MED FILL — MELOXICAM 7.5 MG TABLET: 7.5 | 30 days supply | Qty: 30 | Fill #2

## 2018-08-05 MED FILL — FLUoxetine HCL 40 MG CAPS: 40 | 30 days supply | Qty: 30 | Fill #2

## 2018-08-05 MED FILL — ACETAMINOPHEN/COD #3 TABLET: 300-30 | 30 days supply | Qty: 60 | Fill #2

## 2018-08-05 MED FILL — FUROSEMIDE 20 MG TABLET: 20 | 30 days supply | Qty: 30 | Fill #1

## 2018-08-05 MED FILL — cloNIDine HCL 0.2 MG TABS: 0.2 | 30 days supply | Qty: 30 | Fill #0

## 2018-08-05 MED FILL — LIDOCAINE PATCH 5%: 5 | 30 days supply | Qty: 30 | Fill #0

## 2018-08-05 MED FILL — GABAPENTIN 300 MG CAPSULE: 300 | 30 days supply | Qty: 60 | Fill #2

## 2018-08-06 MED FILL — busPIRone HCL 15 MG TABS: 15 | 30 days supply | Qty: 30 | Fill #0

## 2018-08-08 ENCOUNTER — Telehealth: Payer: Self-pay | Admitting: Family Medicine

## 2018-08-08 NOTE — Telephone Encounter (Signed)
Patient was given a bus pass.

## 2018-09-12 MED FILL — CYCLOBENZAPRINE 10 MG TAB: 10 | 30 days supply | Qty: 60 | Fill #2

## 2018-09-12 MED FILL — MELOXICAM 7.5 MG TABLET: 7.5 | 30 days supply | Qty: 30 | Fill #3

## 2018-09-12 MED FILL — GABAPENTIN 300 MG CAPSULE: 300 | 30 days supply | Qty: 60 | Fill #3

## 2018-09-12 MED FILL — FLUoxetine HCL 40 MG CAPS: 40 | 30 days supply | Qty: 30 | Fill #0

## 2018-09-12 MED FILL — busPIRone HCL 15 MG TABS: 15 | 30 days supply | Qty: 30 | Fill #1

## 2018-09-12 MED FILL — FUROSEMIDE 20 MG TABLET: 20 | 30 days supply | Qty: 30 | Fill #2

## 2018-09-12 MED FILL — LIDOCAINE PATCH 5%: 5 | 30 days supply | Qty: 30 | Fill #1

## 2018-09-12 MED FILL — cloNIDine HCL 0.2 MG TABS: 0.2 | 30 days supply | Qty: 30 | Fill #1

## 2018-09-15 ENCOUNTER — Ambulatory Visit: Payer: Self-pay | Attending: Family Medicine | Admitting: Family Medicine

## 2018-09-15 ENCOUNTER — Ambulatory Visit: Payer: Self-pay | Attending: Family Medicine | Admitting: Licensed Clinical Social Worker

## 2018-09-15 ENCOUNTER — Encounter: Payer: Self-pay | Admitting: Family Medicine

## 2018-09-15 VITALS — BP 112/76 | HR 80 | Temp 98.2°F | Ht 61.0 in | Wt 216.4 lb

## 2018-09-15 DIAGNOSIS — M5010 Cervical disc disorder with radiculopathy, unspecified cervical region: Secondary | ICD-10-CM

## 2018-09-15 DIAGNOSIS — N951 Menopausal and female climacteric states: Secondary | ICD-10-CM | POA: Insufficient documentation

## 2018-09-15 DIAGNOSIS — F4321 Adjustment disorder with depressed mood: Secondary | ICD-10-CM

## 2018-09-15 DIAGNOSIS — M5412 Radiculopathy, cervical region: Secondary | ICD-10-CM | POA: Insufficient documentation

## 2018-09-15 DIAGNOSIS — Z79899 Other long term (current) drug therapy: Secondary | ICD-10-CM | POA: Insufficient documentation

## 2018-09-15 DIAGNOSIS — M48062 Spinal stenosis, lumbar region with neurogenic claudication: Secondary | ICD-10-CM | POA: Insufficient documentation

## 2018-09-15 DIAGNOSIS — D573 Sickle-cell trait: Secondary | ICD-10-CM | POA: Insufficient documentation

## 2018-09-15 DIAGNOSIS — I10 Essential (primary) hypertension: Secondary | ICD-10-CM | POA: Insufficient documentation

## 2018-09-15 DIAGNOSIS — Z720 Tobacco use: Secondary | ICD-10-CM

## 2018-09-15 DIAGNOSIS — F418 Other specified anxiety disorders: Secondary | ICD-10-CM | POA: Insufficient documentation

## 2018-09-15 DIAGNOSIS — F419 Anxiety disorder, unspecified: Secondary | ICD-10-CM

## 2018-09-15 DIAGNOSIS — R232 Flushing: Secondary | ICD-10-CM | POA: Insufficient documentation

## 2018-09-15 DIAGNOSIS — M4802 Spinal stenosis, cervical region: Secondary | ICD-10-CM | POA: Insufficient documentation

## 2018-09-15 DIAGNOSIS — K0889 Other specified disorders of teeth and supporting structures: Secondary | ICD-10-CM | POA: Insufficient documentation

## 2018-09-15 DIAGNOSIS — Z791 Long term (current) use of non-steroidal anti-inflammatories (NSAID): Secondary | ICD-10-CM | POA: Insufficient documentation

## 2018-09-15 DIAGNOSIS — G4709 Other insomnia: Secondary | ICD-10-CM | POA: Insufficient documentation

## 2018-09-15 DIAGNOSIS — F331 Major depressive disorder, recurrent, moderate: Secondary | ICD-10-CM

## 2018-09-15 MED ORDER — GABAPENTIN 300 MG PO CAPS: 600.0000 mg | ORAL_CAPSULE | Freq: Two times a day (BID) | ORAL | 3 refills | Status: DC

## 2018-09-15 MED ORDER — CLINDAMYCIN HCL 300 MG PO CAPS: 300.0000 mg | ORAL_CAPSULE | Freq: Two times a day (BID) | ORAL | 0 refills | Status: DC

## 2018-09-15 MED ORDER — LIDOCAINE 5 % EX PTCH: 1.0000 | MEDICATED_PATCH | CUTANEOUS | 3 refills | Status: DC

## 2018-09-15 MED ORDER — TRAZODONE HCL 50 MG PO TABS: 50.0000 mg | ORAL_TABLET | Freq: Every evening | ORAL | 3 refills | Status: DC | PRN

## 2018-09-15 MED ORDER — MELOXICAM 7.5 MG PO TABS: 7.5000 mg | ORAL_TABLET | Freq: Every day | ORAL | 3 refills | Status: DC

## 2018-09-15 MED ORDER — CLONIDINE HCL 0.2 MG PO TABS: 0.2000 mg | ORAL_TABLET | Freq: Every day | ORAL | 3 refills | Status: DC

## 2018-09-15 MED ORDER — CYCLOBENZAPRINE HCL 10 MG PO TABS: ORAL_TABLET | ORAL | 3 refills | Status: DC

## 2018-09-15 MED ORDER — ACETAMINOPHEN-CODEINE #3 300-30 MG PO TABS: 1.0000 | ORAL_TABLET | Freq: Two times a day (BID) | ORAL | 2 refills | Status: DC | PRN

## 2018-09-15 MED FILL — ACETAMINOPHEN/COD #3 TABLET: 300-30 | 30 days supply | Qty: 60 | Fill #0

## 2018-09-15 MED FILL — GABAPENTIN 300 MG CAPSULE: 300 | 30 days supply | Qty: 120 | Fill #0

## 2018-09-15 MED FILL — traZODone HCL 50 MG TABS: 50 | 30 days supply | Qty: 30 | Fill #0

## 2018-09-15 MED FILL — CLINDAMYCIN HCL 300 MG CAP: 300 | 10 days supply | Qty: 20 | Fill #0

## 2018-09-15 NOTE — BH Specialist Note (Signed)
Integrated Behavioral Health Follow Up Visit  MRN: [id number] Name: [name]  Number of [place] Clinician visits: 3/6 Session Start time: 2:25 PM  Session End time: 2:40 PM Total time: 15 minutes  Type of Service: [place] Interpretor:No. Interpretor Name and Language: N/A  SUBJECTIVE: [name] is a 47 y.o. female accompanied by self Patient was referred by self for grief and financial strain. Patient reports the following symptoms/concerns: Pt shared that partner's brother recently passed away. She is also experiencing financial strain resulting in inability to afford her medications (pt reports being out of meds for approx two weeks) Duration of problem: Ongoing; Severity of problem: moderate  OBJECTIVE: Mood: Anxious and Irritable and Affect: Appropriate Risk of harm to self or others: No plan to harm self or others  LIFE CONTEXT: Family and Social: Pt receives support from family and fiance School/Work: Pt has an appeal pending for disability. She was recently discharged from a lawyer and is in the process of obtaining another.  Self-Care: Pt participates in medication management and therapy through [facility] Life Changes: Pt shared that partner's brother recently passed away. She is also experiencing financial strain resulting in inability to afford her medications (pt reports being out of meds for approx two weeks)  GOALS ADDRESSED: Patient will: 1.  Reduce symptoms of: anxiety, depression and stress  2.  Increase knowledge and/or ability of: coping skills  3.  Demonstrate ability to: Increase healthy adjustment to current life circumstances, Increase adequate support systems for patient/family, Improve medication compliance and Begin healthy grieving over loss  INTERVENTIONS: Interventions utilized:  Solution-Focused Strategies and Link to [employer] Standardized Assessments completed: GAD-7 and  PHQ 2&9  ASSESSMENT: Patient currently experiencing depression and anxiety triggered by grief and financial strain. Pt shared that partner's brother recently passed away. She is also experiencing financial strain resulting in inability to afford her medications (pt reports being out of meds for approx two weeks) Patient participates in medication management and psychotherapy in the community. LCSWA discussed stages of grief and encouraged the importance of support during this difficult time.   Pt was provided financial assistance from  [place]. Pt was strongly encouraged to recertify blue card through financial counseling. Information on support groups for grief were provided.   PLAN: 1. Follow up with behavioral health clinician on : Pt was encouraged tocontact LCSWA if symptoms worsen or fail to improveto schedule behavioral appointments at [facility]. 2. Behavioral recommendations: LCSWA recommends that pt apply healthy coping skills discussed, comply with services at [facility], and utilize provided resources.  3. Referral(s): [place] [facility]) 4. "From scale of 1-10, how likely are you to follow plan?":   Rebekah Chesterfield, LCSW 09/16/18 12:36 PM

## 2018-09-15 NOTE — Progress Notes (Signed)
Subjective:  Patient ID: [name], female    DOB: [birth year]  Age: 47 y.o. MRN: [id number]  CC: Back Pain   HPI [name]  is a 47 year old female with a history of asthma, hypertension, depression, chronic low back pain secondary to spinal stenosis, degenerative disc disease of the thoracic spine, cervical stenosis, hot flashes, chronic constipation who presents today for a follow-up visit. She has been out of her medications for the last 2 weeks as she was waiting to come for her appointment to pick them up. Her low back pain has been uncontrolled and I had referred her to rehab medicine 3 months ago however she is yet to obtain an appointment.  Pain does not radiate down her lower extremities.  She ambulates with a cane and denies recent falls. Her hot flashes have been uncontrolled on clonidine and gabapentin and she has had this since she was in her late 102s. Complaints of pain in her left upper canine and states it needs to be extracted.  Denies fever, abscess formation.  Has been unable to see a dentist. Her asthma is controlled and she is doing well on her antihypertensives.  Depression is managed at [facility].  Past Medical History:  Diagnosis Date  . Allergy    pollen, pcn  . Asthma   . Depression   . Fibroids   . History of ovarian cyst   . Medical history non-contributory    sickle cell  . Menometrorrhagia   . 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 Sickle cell trait [facility])     Past Surgical History:  Procedure Laterality Date  . CESAREAN SECTION     all three pregnancies    Allergies  Allergen Reactions  . Bupropion Hcl Er (Sr) Hives and Other (See Comments)    The patient has an entire page of reactions that can be caused by the medication, but none that she has suffered.  . Fruit & Vegetable Daily [Nutritional Supplements] Other (See Comments)    "all fruits causes bumps and weird feeling in her mouth"  . Penicillins Hives  . Pollen  Extract-Tree Extract Other (See Comments)    Allergies  . Other Rash    "bugs" roaches     Outpatient Medications Prior to Visit  Medication Sig Dispense Refill  . albuterol (VENTOLIN HFA) 108 (90 Base) MCG/ACT inhaler Inhale 2 puffs into the lungs every 6 (six) hours as needed for wheezing or shortness of breath. 54 g 3  . busPIRone (BUSPAR) 15 MG tablet Take 1 tablet (15 mg total) by mouth daily. 30 tablet 3  . cariprazine (VRAYLAR) capsule Take by mouth daily.    . clotrimazole (LOTRIMIN) 1 % cream Apply 1 application topically 2 (two) times daily. 45 g 1  . FLUoxetine (PROZAC) 40 MG capsule Take 1 capsule (40 mg total) by mouth daily. 30 capsule 1  . Fluticasone-Salmeterol (ADVAIR DISKUS) 500-50 MCG/DOSE AEPB Inhale 1 puff into the lungs 2 (two) times daily. 180 each 3  . furosemide (LASIX) 20 MG tablet TAKE 1 TABLET BY MOUTH DAILY AS NEEDED FOR PEDAL EDEMA 30 tablet 2  . lactulose (CHRONULAC) 10 GM/15ML solution Take 15 mLs (10 g total) by mouth 2 (two) times daily as needed for mild constipation. 946 mL 1  . levocetirizine (XYZAL) 5 MG tablet Take 1 tablet (5 mg total) by mouth every evening. 30 tablet 2  . triamcinolone cream (KENALOG) 0.1 % APPLY TOPICALLY 2 (TWO)  TIMES DAILY. 30 g 1  . acetaminophen-codeine (TYLENOL #3) 300-30 MG tablet Take 1 tablet by mouth every 12 (twelve) hours as needed for moderate pain. 60 tablet 2  . cloNIDine (CATAPRES) 0.2 MG tablet Take 1 tablet (0.2 mg total) by mouth at bedtime. 30 tablet 3  . cyclobenzaprine (FLEXERIL) 10 MG tablet TAKE 1 TABLET BY MOUTH 2 TIMES DAILY AS NEEDED FOR MUSCLE SPASMS. 60 tablet 3  . gabapentin (NEURONTIN) 300 MG capsule Take 2 capsules (600 mg total) by mouth 2 (two) times daily. 120 capsule 3  . lidocaine (LIDODERM) 5 % Place 1 patch onto the skin daily. Remove & Discard patch within 12 hours or as directed by MD 30 patch 3  . meloxicam (MOBIC) 7.5 MG tablet Take 1 tablet (7.5 mg total) by mouth daily. 30 tablet 3  .  traZODone (DESYREL) 50 MG tablet Take 1 tablet (50 mg total) by mouth at bedtime as needed. for sleep 30 tablet 3   No facility-administered medications prior to visit.     ROS Review of Systems  Constitutional: Negative for activity change, appetite change and fatigue.  HENT: Positive for dental problem. Negative for congestion, sinus pressure and sore throat.   Eyes: Negative for visual disturbance.  Respiratory: Negative for cough, chest tightness, shortness of breath and wheezing.   Cardiovascular: Negative for chest pain and palpitations.  Gastrointestinal: Negative for abdominal distention, abdominal pain and constipation.  Endocrine: Negative for polydipsia.  Genitourinary: Negative for dysuria and frequency.  Musculoskeletal: Positive for back pain and neck pain. Negative for arthralgias.  Skin: Negative for rash.  Neurological: Negative for tremors, light-headedness and numbness.  Hematological: Does not bruise/bleed easily.  Psychiatric/Behavioral: Negative for agitation and behavioral problems.    Objective:  BP 112/76   Pulse 80   Temp 98.2 F (36.8 C) (Oral)   Ht 5\' 1"  (1.549 m)   Wt 216 lb 6.4 oz (98.2 kg)   SpO2 99%   BMI 40.89 kg/m   BP/Weight 09/15/2018 06/11/2018 4/40/3474  Systolic BP 259 563 875  Diastolic BP 76 74 85  Wt. (Lbs) 216.4 212.8 205  BMI 40.89 40.21 38.73  Some encounter information is confidential and restricted. Go to Review Flowsheets activity to see all data.      Physical Exam Constitutional:      Appearance: She is well-developed.  HENT:     Mouth/Throat:     Comments: Poor oral hygiene with brownish discoloration of teeth, partially broken left upper canine Cardiovascular:     Rate and Rhythm: Normal rate.     Heart sounds: Normal heart sounds. No murmur.  Pulmonary:     Effort: Pulmonary effort is normal.     Breath sounds: Normal breath sounds. No wheezing or rales.  Chest:     Chest wall: No tenderness.  Abdominal:      General: Bowel sounds are normal. There is no distension.     Palpations: Abdomen is soft. There is no mass.     Tenderness: There is no abdominal tenderness.  Musculoskeletal:     Comments: Lumbar spine tenderness; negative straight leg raise bilaterally  Neurological:     Mental Status: She is alert and oriented to person, place, and time.  Psychiatric:        Mood and Affect: Mood normal.        Behavior: Behavior normal.      Assessment & Plan:   1. Spinal stenosis, lumbar region, with neurogenic claudication Uncontrolled due to running  out of medications Referred to rehab however she has not made it yet due to lack of medical coverage and lack of the orange card - acetaminophen-codeine (TYLENOL #3) 300-30 MG tablet; Take 1 tablet by mouth every 12 (twelve) hours as needed for moderate pain.  Dispense: 60 tablet; Refill: 2 - cyclobenzaprine (FLEXERIL) 10 MG tablet; TAKE 1 TABLET BY MOUTH 2 TIMES DAILY AS NEEDED FOR MUSCLE SPASMS.  Dispense: 60 tablet; Refill: 3 - gabapentin (NEURONTIN) 300 MG capsule; Take 2 capsules (600 mg total) by mouth 2 (two) times daily.  Dispense: 120 capsule; Refill: 3 - lidocaine (LIDODERM) 5 %; Place 1 patch onto the skin daily. Remove & Discard patch within 12 hours or as directed by MD  Dispense: 30 patch; Refill: 3 - meloxicam (MOBIC) 7.5 MG tablet; Take 1 tablet (7.5 mg total) by mouth daily.  Dispense: 30 tablet; Refill: 3  2. Hot flashes Uncontrolled We have discussed hormone replacement therapy however she continues to smoke-we will hold off and discuss this again at her next visit - cloNIDine (CATAPRES) 0.2 MG tablet; Take 1 tablet (0.2 mg total) by mouth at bedtime.  Dispense: 30 tablet; Refill: 3  3. Cervical disc disorder with radiculopathy of cervical region See #1 above - cyclobenzaprine (FLEXERIL) 10 MG tablet; TAKE 1 TABLET BY MOUTH 2 TIMES DAILY AS NEEDED FOR MUSCLE SPASMS.  Dispense: 60 tablet; Refill: 3  4. Other insomnia Stable -  traZODone (DESYREL) 50 MG tablet; Take 1 tablet (50 mg total) by mouth at bedtime as needed. for sleep  Dispense: 30 tablet; Refill: 3  5. Tooth ache New problem Unable to see a dentist due to lack of medical coverage and lack of orange card precludes me from referring her to care for adult dental She will be working on obtaining the orange card - clindamycin (CLEOCIN) 300 MG capsule; Take 1 capsule (300 mg total) by mouth 2 (two) times daily.  Dispense: 20 capsule; Refill: 0  6. Anxiety associated with depression stable Currently managed by [facility]  7. Tobacco abuse Discussed cessation and she will be working on quitting   Meds ordered this encounter  Medications  . clindamycin (CLEOCIN) 300 MG capsule    Sig: Take 1 capsule (300 mg total) by mouth 2 (two) times daily.    Dispense:  20 capsule    Refill:  0  . acetaminophen-codeine (TYLENOL #3) 300-30 MG tablet    Sig: Take 1 tablet by mouth every 12 (twelve) hours as needed for moderate pain.    Dispense:  60 tablet    Refill:  2  . cloNIDine (CATAPRES) 0.2 MG tablet    Sig: Take 1 tablet (0.2 mg total) by mouth at bedtime.    Dispense:  30 tablet    Refill:  3    Discontinue previous dose  . cyclobenzaprine (FLEXERIL) 10 MG tablet    Sig: TAKE 1 TABLET BY MOUTH 2 TIMES DAILY AS NEEDED FOR MUSCLE SPASMS.    Dispense:  60 tablet    Refill:  3  . gabapentin (NEURONTIN) 300 MG capsule    Sig: Take 2 capsules (600 mg total) by mouth 2 (two) times daily.    Dispense:  120 capsule    Refill:  3  . lidocaine (LIDODERM) 5 %    Sig: Place 1 patch onto the skin daily. Remove & Discard patch within 12 hours or as directed by MD    Dispense:  30 patch    Refill:  3  .  meloxicam (MOBIC) 7.5 MG tablet    Sig: Take 1 tablet (7.5 mg total) by mouth daily.    Dispense:  30 tablet    Refill:  3  . traZODone (DESYREL) 50 MG tablet    Sig: Take 1 tablet (50 mg total) by mouth at bedtime as needed. for sleep    Dispense:  30 tablet     Refill:  3    Follow-up: Return in about 3 months (around 12/15/2018) for follow up of chronic medical conditions.   Charlott Rakes MD

## 2018-09-15 NOTE — Progress Notes (Signed)
Patient has been out of all medication for 2 weeks.

## 2018-09-24 ENCOUNTER — Ambulatory Visit: Payer: Self-pay | Attending: Family Medicine | Admitting: Licensed Clinical Social Worker

## 2018-09-24 ENCOUNTER — Ambulatory Visit: Payer: Self-pay | Attending: Family Medicine

## 2018-09-24 DIAGNOSIS — F419 Anxiety disorder, unspecified: Secondary | ICD-10-CM

## 2018-09-24 DIAGNOSIS — F331 Major depressive disorder, recurrent, moderate: Secondary | ICD-10-CM

## 2018-09-24 MED FILL — ACETAMINOPHEN/COD #3 TABLET: 300-30 | 30 days supply | Qty: 60 | Fill #0

## 2018-09-26 NOTE — BH Specialist Note (Signed)
Integrated Behavioral Health Follow Up Visit  MRN: [id number] Name: [name]  Number of [place] Clinician visits: 4/6 Session Start time: 4:15 PM  Session End time: 4:45 PM Total time: 30 minutes  Type of Service: [place] Interpretor:No. Interpretor Name and Language: N/A  SUBJECTIVE: [name] is a 48 y.o. female accompanied by self Patient was referred by self for depression and anxiety. Patient reports the following symptoms/concerns: Pt shared that she recently found out that partner is using substances Duration of problem: less than a week; Severity of problem: moderate  OBJECTIVE: Mood: Angry, Anxious and Depressed and Affect: Tearful Risk of harm to self or others: No plan to harm self or others  LIFE CONTEXT: Family and Social: Pt receives support from family School/Work: Pt has an appeal pending for disability.  Self-Care: Pt receives behavioral health services through [facility] Changes: Pt recently found out about partner's substance use  GOALS ADDRESSED: Patient will: 1.  Reduce symptoms of: anxiety, depression and stress  2.  Increase knowledge and/or ability of: self-management skills and stress reduction  3.  Demonstrate ability to: Increase healthy adjustment to current life circumstances and Increase adequate support systems for patient/family  INTERVENTIONS: Interventions utilized:  Solution-Focused Strategies, Supportive Counseling, Psychoeducation and/or Health Education and Link to [employer] Standardized Assessments completed: Not Needed  ASSESSMENT: Patient currently experiencing depression and anxiety triggered by partner's substance use. [place] educated pt on substance use and strategies to discuss concerns with partner. Supportive resources were also provided. Pt was provided transportation resources to assist pt with scheduling appointment with  [facility].  PLAN: 1. Follow up with behavioral health clinician on : Pt was encouraged tocontact LCSWA if symptoms worsen or fail to improveto schedule behavioral appointments at [facility]. 2. Behavioral recommendations: LCSWA recommends that pt apply healthy coping skills discussed, comply with services at [facility], and utilize provided resources.  3. Referral(s): [place] (In Clinic) 4. "From scale of 1-10, how likely are you to follow plan?":   Rebekah Chesterfield, LCSW 09/27/2018 11:32 AM

## 2018-10-10 ENCOUNTER — Other Ambulatory Visit: Payer: Self-pay | Admitting: Family Medicine

## 2018-10-10 DIAGNOSIS — R600 Localized edema: Secondary | ICD-10-CM

## 2018-10-10 MED FILL — $ADVAIR 500/50MCG INHALER: 500-50 | 30 days supply | Qty: 60 | Fill #5

## 2018-10-10 MED FILL — FLUoxetine HCL 40 MG CAPS: 40 | 30 days supply | Qty: 30 | Fill #1

## 2018-10-10 MED FILL — GABAPENTIN 300 MG CAPSULE: 300 | 30 days supply | Qty: 120 | Fill #0

## 2018-10-10 MED FILL — $VENTOLIN HFA 18G INHALER: 108 (90 BAS | 25 days supply | Qty: 18 | Fill #5

## 2018-10-10 MED FILL — CYCLOBENZAPRINE 10 MG TAB: 10 | 30 days supply | Qty: 60 | Fill #0

## 2018-10-10 MED FILL — MELOXICAM 7.5 MG TABLET: 7.5 | 30 days supply | Qty: 30 | Fill #0

## 2018-10-10 MED FILL — cloNIDine HCL 0.2 MG TABS: 0.2 | 30 days supply | Qty: 30 | Fill #0

## 2018-10-13 MED FILL — LIDOCAINE PATCH 5%: 5 | 30 days supply | Qty: 30 | Fill #0

## 2018-10-13 MED FILL — FUROSEMIDE 20 MG TABLET: 20 | 30 days supply | Qty: 30 | Fill #0

## 2018-10-14 ENCOUNTER — Telehealth: Payer: Self-pay | Admitting: Family Medicine

## 2018-10-14 MED FILL — busPIRone HCL 15 MG TABS: 15 | 30 days supply | Qty: 30 | Fill #2

## 2018-10-14 MED FILL — traZODone HCL 50 MG TABS: 50 | 30 days supply | Qty: 30 | Fill #1

## 2018-10-14 MED FILL — cloNIDine HCL 0.2 MG TABS: 0.2 | 30 days supply | Qty: 30 | Fill #2

## 2018-10-14 NOTE — Telephone Encounter (Signed)
Pt came in to check on the status of her application/ paper work please follow up

## 2018-10-14 NOTE — Telephone Encounter (Signed)
Tried to contact patient; no answer. Will try again later 

## 2018-10-21 ENCOUNTER — Other Ambulatory Visit: Payer: Self-pay

## 2018-10-21 ENCOUNTER — Ambulatory Visit: Payer: Self-pay | Attending: Primary Care | Admitting: Primary Care

## 2018-10-21 VITALS — BP 116/81 | HR 79 | Temp 98.1°F | Resp 16 | Wt 218.0 lb

## 2018-10-21 DIAGNOSIS — K047 Periapical abscess without sinus: Secondary | ICD-10-CM

## 2018-10-21 DIAGNOSIS — J029 Acute pharyngitis, unspecified: Secondary | ICD-10-CM

## 2018-10-21 MED ORDER — FLUCONAZOLE 150 MG PO TABS: 150.0000 mg | ORAL_TABLET | Freq: Once | ORAL | 0 refills | Status: AC

## 2018-10-21 MED ORDER — NYSTATIN 100000 UNIT/ML MT SUSP: 5.0000 mL | Freq: Four times a day (QID) | OROMUCOSAL | 0 refills | Status: DC

## 2018-10-21 MED ORDER — CLINDAMYCIN HCL 300 MG PO CAPS: 300.0000 mg | ORAL_CAPSULE | Freq: Three times a day (TID) | ORAL | 0 refills | Status: AC

## 2018-10-21 MED ORDER — CLINDAMYCIN HCL 150 MG PO CAPS: 300.0000 mg | ORAL_CAPSULE | Freq: Three times a day (TID) | ORAL | Status: DC

## 2018-10-21 MED FILL — ACETAMINOPHEN/COD #3 TABLET: 300-30 | 30 days supply | Qty: 60 | Fill #1

## 2018-10-21 MED FILL — FLUCONAZOLE 150 MG TABS: 150 | 1 days supply | Qty: 1 | Fill #0

## 2018-10-21 MED FILL — ?CLINDAMYCIN HCL 300 MG CAP: 300 | 10 days supply | Qty: 30 | Fill #0

## 2018-10-21 MED FILL — NYSTATIN 100,000 UNITS/ML S: 100000 | 6 days supply | Qty: 120 | Fill #0

## 2018-10-21 NOTE — Progress Notes (Signed)
Intermittent Tooth pain x 3 months  Out of Tylenol # 3 which helped with pain.

## 2018-10-21 NOTE — Progress Notes (Signed)
Acute Office Visit  Subjective:    Patient ID: [name], female    DOB: 03-Mar-1971, 48 y.o.   MRN: [id number]  Chief Complaint  Patient presents with  . Dental Pain    HPI Patient is in today requesting antibiotics for poor dental carries. She is in the prs is ocess of trying to get dental insurance. Explained abt's is a small bandage to a large wound. She has   Past Medical History:  Diagnosis Date  . Allergy    pollen, pcn  . Asthma   . Depression   . Fibroids   . History of ovarian cyst   . Medical history non-contributory    sickle cell  . Menometrorrhagia   . 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 Sickle cell trait [facility])     Past Surgical History:  Procedure Laterality Date  . CESAREAN SECTION     all three pregnancies    Family History  Problem Relation Age of Onset  . Cancer Mother        colon cancer  . Hypertension Mother   . Heart disease Father   . Cancer Father        cancer  . Other Neg Hx     Social History   Socioeconomic History  . Marital status: Divorced    Spouse name: Not on file  . Number of children: Not on file  . Years of education: Not on file  . Highest education level: Not on file  Occupational History  . Not on file  Social Needs  . Financial resource strain: Not on file  . Food insecurity:    Worry: Not on file    Inability: Not on file  . Transportation needs:    Medical: Not on file    Non-medical: Not on file  Tobacco Use  . Smoking status: Current Every Day Smoker    Packs/day: 0.25    Types: Cigarettes    Last attempt to quit: 02/14/2016    Years since quitting: 2.6  . Smokeless tobacco: Never Used  . Tobacco comment: 1 cig daily 09/13/17  Substance and Sexual Activity  . Alcohol use: No  . Drug use: No  . Sexual activity: Yes    Partners: Male    Birth control/protection: I.U.D., None  Lifestyle  . Physical activity:    Days per week: Not on file    Minutes per session: Not on file  .  Stress: Not on file  Relationships  . Social connections:    Talks on phone: Not on file    Gets together: Not on file    Attends religious service: Not on file    Active member of club or organization: Not on file    Attends meetings of clubs or organizations: Not on file    Relationship status: Not on file  . Intimate partner violence:    Fear of current or ex partner: Not on file    Emotionally abused: Not on file    Physically abused: Not on file    Forced sexual activity: Not on file  Other Topics Concern  . Not on file  Social History Narrative  . Not on file    Outpatient Medications Prior to Visit  Medication Sig Dispense Refill  . acetaminophen-codeine (TYLENOL #3) 300-30 MG tablet Take 1 tablet by mouth every 12 (twelve) hours as needed for moderate pain. 60 tablet 2  . albuterol (VENTOLIN HFA) 108 (  90 Base) MCG/ACT inhaler Inhale 2 puffs into the lungs every 6 (six) hours as needed for wheezing or shortness of breath. 54 g 3  . busPIRone (BUSPAR) 15 MG tablet Take 1 tablet (15 mg total) by mouth daily. 30 tablet 3  . cariprazine (VRAYLAR) capsule Take by mouth daily.    . cloNIDine (CATAPRES) 0.2 MG tablet Take 1 tablet (0.2 mg total) by mouth at bedtime. 30 tablet 3  . clotrimazole (LOTRIMIN) 1 % cream Apply 1 application topically 2 (two) times daily. 45 g 1  . cyclobenzaprine (FLEXERIL) 10 MG tablet TAKE 1 TABLET BY MOUTH 2 TIMES DAILY AS NEEDED FOR MUSCLE SPASMS. 60 tablet 3  . FLUoxetine (PROZAC) 40 MG capsule Take 1 capsule (40 mg total) by mouth daily. 30 capsule 1  . Fluticasone-Salmeterol (ADVAIR DISKUS) 500-50 MCG/DOSE AEPB Inhale 1 puff into the lungs 2 (two) times daily. 180 each 3  . furosemide (LASIX) 20 MG tablet TAKE 1 TABLET BY MOUTH DAILY AS NEEDED FOR PEDAL EDEMA 30 tablet 2  . gabapentin (NEURONTIN) 300 MG capsule Take 2 capsules (600 mg total) by mouth 2 (two) times daily. 120 capsule 3  . lactulose (CHRONULAC) 10 GM/15ML solution Take 15 mLs (10 g  total) by mouth 2 (two) times daily as needed for mild constipation. 946 mL 1  . levocetirizine (XYZAL) 5 MG tablet Take 1 tablet (5 mg total) by mouth every evening. 30 tablet 2  . lidocaine (LIDODERM) 5 % Place 1 patch onto the skin daily. Remove & Discard patch within 12 hours or as directed by MD 30 patch 3  . meloxicam (MOBIC) 7.5 MG tablet Take 1 tablet (7.5 mg total) by mouth daily. 30 tablet 3  . traZODone (DESYREL) 50 MG tablet Take 1 tablet (50 mg total) by mouth at bedtime as needed. for sleep 30 tablet 3  . triamcinolone cream (KENALOG) 0.1 % APPLY TOPICALLY 2 (TWO) TIMES DAILY. 30 g 1  . clindamycin (CLEOCIN) 300 MG capsule Take 1 capsule (300 mg total) by mouth 2 (two) times daily. (Patient not taking: Reported on 10/21/2018) 20 capsule 0   No facility-administered medications prior to visit.     Allergies  Allergen Reactions  . Bupropion Hcl Er (Sr) Hives and Other (See Comments)    The patient has an entire page of reactions that can be caused by the medication, but none that she has suffered.  . Fruit & Vegetable Daily [Nutritional Supplements] Other (See Comments)    "all fruits causes bumps and weird feeling in her mouth"  . Penicillins Hives  . Pollen Extract-Tree Extract Other (See Comments)    Allergies  . Other Rash    "bugs" roaches    Review of Systems  Constitutional: Negative.   HENT: Positive for sore throat.   Eyes: Negative.   Respiratory: Negative.   Cardiovascular: Negative.   Gastrointestinal: Negative.   Genitourinary: Negative.   Musculoskeletal: Negative.   Skin: Negative.   Neurological: Negative.   Endo/Heme/Allergies: Negative.   Psychiatric/Behavioral: Positive for depression.       Objective:    Physical Exam  Constitutional: She is oriented to person, place, and time. She appears well-nourished.  HENT:  Head: Normocephalic.  Neck: Normal range of motion.  Cardiovascular: Normal rate.  Pulmonary/Chest: Effort normal and breath  sounds normal.  Abdominal: Soft.  Musculoskeletal: Normal range of motion.  Neurological: She is oriented to person, place, and time.  Skin: Skin is warm.   Poor dental carries broken and  decayed teeth through out her mouth  BP 116/81 (BP Location: Left Arm, Patient Position: Sitting, Cuff Size: Large)   Pulse 79   Temp 98.1 F (36.7 C) (Oral)   Resp 16   Wt 218 lb (98.9 kg)   SpO2 97%   BMI 41.19 kg/m  Wt Readings from Last 3 Encounters:  10/21/18 218 lb (98.9 kg)  09/15/18 216 lb 6.4 oz (98.2 kg)  06/11/18 212 lb 12.8 oz (96.5 kg)    There are no preventive care reminders to display for this patient.  There are no preventive care reminders to display for this patient.   Lab Results  Component Value Date   TSH 3.100 12/11/2016   Lab Results  Component Value Date   WBC 7.2 04/18/2015   HGB 12.6 08/02/2016   HCT 37.0 08/02/2016   MCV 78.6 04/18/2015   PLT 314 04/18/2015   Lab Results  Component Value Date   NA 142 12/12/2017   K 3.7 12/12/2017   CO2 27 12/12/2017   GLUCOSE 78 12/12/2017   BUN 6 12/12/2017   CREATININE 0.98 12/12/2017   BILITOT 0.5 12/12/2017   ALKPHOS 91 12/12/2017   AST 21 12/12/2017   ALT 16 12/12/2017   PROT 6.8 12/12/2017   ALBUMIN 4.2 12/12/2017   CALCIUM 9.2 12/12/2017   ANIONGAP 8 04/18/2015   Lab Results  Component Value Date   CHOL 226 (H) 12/12/2017   Lab Results  Component Value Date   HDL 55 12/12/2017   Lab Results  Component Value Date   LDLCALC 154 (H) 12/12/2017   Lab Results  Component Value Date   TRIG 85 12/12/2017   Lab Results  Component Value Date   CHOLHDL 4.1 12/12/2017   Lab Results  Component Value Date   HGBA1C 4.8 12/12/2017       Assessment & Plan:   Problem List Items Addressed This Visit    None       Meds ordered this encounter  Medications  . nystatin (MYCOSTATIN) 100000 UNIT/ML suspension    Sig: Take 5 mLs (500,000 Units total) by mouth 4 (four) times daily.    Dispense:   120 mL    Refill:  0     Kerin Perna, NP

## 2018-10-21 NOTE — Patient Instructions (Signed)
Oral Thrush, Adult  Oral thrush is an infection in your mouth and throat. It causes white patches on your tongue and in your mouth. Follow these instructions at home: Helping with soreness   To lessen your pain: ? Drink cold liquids, like water and iced tea. ? Eat frozen ice pops or frozen juices. ? Eat foods that are easy to swallow, like gelatin and ice cream. ? Drink from a straw if the patches in your mouth are painful. General instructions  Take or use over-the-counter and prescription medicines only as told by your doctor. Medicine for oral thrush may be something to swallow, or it may be something to put on the infected area.  Eat plain yogurt that has live cultures in it. Read the label to make sure.  If you wear dentures: ? Take out your dentures before you go to bed. ? Brush them well. ? Soak them in a denture cleaner.  Rinse your mouth with warm salt-water many times a day. To make the salt-water mixture, completely dissolve 1/2-1 teaspoon of salt in 1 cup of warm water. Contact a doctor if:  Your problems are getting worse.  Your problems do not get better in less than 7 days with treatment.  Your infection is spreading. This may show as white patches on the skin outside of your mouth.  You are nursing your baby and you have redness and pain in the nipples. This information is not intended to replace advice given to you by your health care provider. Make sure you discuss any questions you have with your health care provider. Document Released: 11/28/2009 Document Revised: 05/28/2016 Document Reviewed: 05/28/2016 Elsevier Interactive Patient Education  2019 [employer]  Dental caries are spots of decay (cavities) in teeth. They are in the outer layer of your tooth (enamel). Treat them as soon as you can. If they are not treated, they can spread decay and lead to painful infection. Follow these instructions at home: General instructions  Take good  care of your mouth and teeth. This keeps them healthy. ? Brush your teeth 2 times a day. Use toothpaste with fluoride in it. ? Floss your teeth once a day.  If your dentist prescribed an antibiotic medicine to treat an infection, take it as told. Do not stop taking the antibiotic even if your condition gets better.  Keep all follow-up visits as told by your dentist. This is important. This includes all cleanings. Preventing dental caries   Brush your teeth every morning and night. Use fluoride toothpaste.  Get regular dental cleanings.  If you are at risk of dental caries. ? Wash your mouth with prescription mouthwash (chlorhexidine). ? Put topical fluoride on your teeth.  Drink water with fluoride in it.  Drink water instead of sugary drinks.  Eat healthy meals and snacks. Contact a doctor if:  You have symptoms of tooth decay. Summary  Dental caries are spots of decay (cavities) in teeth. They are in the outer layer of your tooth.  Take an antibiotic to treat an infection, if told by your dentist. Do not stop taking the antibiotic even if your condition gets better.  Regular dental cleanings and brushing can help prevent dental caries. This information is not intended to replace advice given to you by your health care provider. Make sure you discuss any questions you have with your health care provider. Document Released: 06/12/2008 Document Revised: 05/20/2016 Document Reviewed: 05/20/2016 Elsevier Interactive Patient Education  2019 [employer].

## 2018-10-22 ENCOUNTER — Encounter: Payer: Self-pay | Admitting: Primary Care

## 2018-11-13 MED FILL — CYCLOBENZAPRINE 10 MG TAB: 10 | 30 days supply | Qty: 60 | Fill #1

## 2018-11-13 MED FILL — FUROSEMIDE 20 MG TABLET: 20 | 30 days supply | Qty: 30 | Fill #1

## 2018-11-13 MED FILL — GABAPENTIN 300 MG CAPSULE: 300 | 30 days supply | Qty: 120 | Fill #1

## 2018-11-13 MED FILL — MELOXICAM 7.5 MG TABLET: 7.5 | 30 days supply | Qty: 30 | Fill #1

## 2018-11-13 MED FILL — !VENTOLIN HFA INHALER: 108 (90 BAS | 25 days supply | Qty: 18 | Fill #6

## 2018-11-13 MED FILL — !ADVAIR 500/50 DISKUS: 500-50 | 30 days supply | Qty: 60 | Fill #6

## 2018-11-13 MED FILL — traZODone HCL 50 MG TABS: 50 | 30 days supply | Qty: 30 | Fill #2

## 2018-11-13 MED FILL — FLUoxetine HCL 40 MG CAPS: 40 | 30 days supply | Qty: 30 | Fill #0

## 2018-11-13 MED FILL — busPIRone HCL 15 MG TABS: 15 | 30 days supply | Qty: 30 | Fill #3

## 2018-11-13 MED FILL — cloNIDine HCL 0.2 MG TABS: 0.2 | 30 days supply | Qty: 30 | Fill #3

## 2018-11-14 MED FILL — ACETAMINOPHEN/COD #3 TABLET: 300-30 | 30 days supply | Qty: 60 | Fill #2

## 2018-11-14 MED FILL — LIDOCAINE PATCH 5%: 5 | 30 days supply | Qty: 30 | Fill #1

## 2018-11-24 ENCOUNTER — Telehealth: Payer: Self-pay | Admitting: Family Medicine

## 2018-11-24 NOTE — Telephone Encounter (Signed)
[name] with social services called to obtain a copy of the patients [place] application. Expressed that a signed form was (release form) needed. Please follow up

## 2018-11-24 NOTE — Telephone Encounter (Signed)
LVM to call me back

## 2018-12-03 ENCOUNTER — Other Ambulatory Visit: Payer: Self-pay | Admitting: Family Medicine

## 2018-12-03 DIAGNOSIS — R232 Flushing: Secondary | ICD-10-CM

## 2018-12-03 DIAGNOSIS — F419 Anxiety disorder, unspecified: Secondary | ICD-10-CM

## 2018-12-03 MED FILL — $VENTOLIN HFA 18G INHALER: 108 (90 BAS | 25 days supply | Qty: 18 | Fill #7

## 2018-12-03 MED FILL — $ADVAIR 500/50MCG INHALER: 500-50 | 30 days supply | Qty: 60 | Fill #7

## 2018-12-09 MED FILL — ?FUROSEMIDE 20 MG TABLET: 20 | 30 days supply | Qty: 30 | Fill #2

## 2018-12-09 MED FILL — busPIRone HCL 15 MG TABS: 15 | 30 days supply | Qty: 30 | Fill #2

## 2018-12-09 MED FILL — FLUoxetine HCL 40 MG CAPS: 40 | 30 days supply | Qty: 30 | Fill #1

## 2018-12-09 MED FILL — CYCLOBENZAPRINE 10 MG TAB: 10 | 30 days supply | Qty: 60 | Fill #2

## 2018-12-09 MED FILL — traZODone HCL 50 MG TABS: 50 | 30 days supply | Qty: 30 | Fill #3

## 2018-12-09 MED FILL — LIDOCAINE PATCH 5%: 5 | 30 days supply | Qty: 30 | Fill #2

## 2018-12-09 MED FILL — MELOXICAM 7.5 MG TABLET: 7.5 | 30 days supply | Qty: 30 | Fill #2

## 2018-12-09 MED FILL — GABAPENTIN 300 MG CAPSULE: 300 | 30 days supply | Qty: 120 | Fill #2

## 2018-12-09 MED FILL — cloNIDine HCL 0.2 MG TABS: 0.2 | 30 days supply | Qty: 30 | Fill #1

## 2018-12-16 ENCOUNTER — Telehealth: Payer: Self-pay

## 2018-12-16 ENCOUNTER — Other Ambulatory Visit: Payer: Self-pay

## 2018-12-16 ENCOUNTER — Ambulatory Visit: Payer: Self-pay | Attending: Family Medicine | Admitting: Family Medicine

## 2018-12-16 NOTE — Telephone Encounter (Signed)
Patient had Phone visit set up for today. Patient did not pick when called for appointment.

## 2018-12-16 NOTE — Telephone Encounter (Signed)
She missed her appointment.  She needs a new telephone visit.

## 2018-12-16 NOTE — Telephone Encounter (Signed)
Pt called back wanted to inform her dr that she is having tooth aches would like to be prescribed something for it

## 2018-12-17 NOTE — Telephone Encounter (Signed)
Patient was called and there is no voicemail set up to leave a message. Patient is very hard to contact.

## 2018-12-22 ENCOUNTER — Telehealth (INDEPENDENT_AMBULATORY_CARE_PROVIDER_SITE_OTHER): Payer: Self-pay | Admitting: Family Medicine

## 2018-12-22 NOTE — Telephone Encounter (Signed)
Patient called stating she recently updated her Telephone number [phone]. Caller ID states [phone]  Patient will also like refill for acetaminophen-codeine (TYLENOL #3) 300-30 MG tablet   Patient uses [facility] pharmacy  Please Advise  Thank you Emmit Pomfret

## 2018-12-22 NOTE — Telephone Encounter (Signed)
Patient request refill set up for a phone visit today or tomorrow.

## 2018-12-23 NOTE — Telephone Encounter (Signed)
Please place her on the schedule for next available. Thanks

## 2018-12-24 MED FILL — MELOXICAM 7.5 MG TABLET: 7.5 | 30 days supply | Qty: 30 | Fill #3

## 2018-12-24 MED FILL — cloNIDine HCL 0.2 MG TABS: 0.2 | 30 days supply | Qty: 30 | Fill #2

## 2018-12-27 ENCOUNTER — Other Ambulatory Visit: Payer: Self-pay | Admitting: Family Medicine

## 2018-12-27 DIAGNOSIS — M48062 Spinal stenosis, lumbar region with neurogenic claudication: Secondary | ICD-10-CM

## 2018-12-29 ENCOUNTER — Telehealth: Payer: Self-pay | Admitting: Family Medicine

## 2018-12-29 NOTE — Telephone Encounter (Signed)
1) Medication(s) Requested (by name): Tylenol #3  2) Pharmacy of Choice: [facility] 3) Special Requests:   Approved medications will be sent to the pharmacy, we will reach out if there is an issue.  Requests made after 3pm may not be addressed until the following business day!  If a patient is unsure of the name of the medication(s) please note and ask patient to call back when they are able to provide all info, do not send to responsible party until all information is available!

## 2018-12-31 ENCOUNTER — Other Ambulatory Visit: Payer: Self-pay

## 2018-12-31 ENCOUNTER — Encounter: Payer: Self-pay | Admitting: Family Medicine

## 2018-12-31 ENCOUNTER — Ambulatory Visit: Payer: Self-pay | Attending: Family Medicine | Admitting: Family Medicine

## 2018-12-31 DIAGNOSIS — M5010 Cervical disc disorder with radiculopathy, unspecified cervical region: Secondary | ICD-10-CM

## 2018-12-31 DIAGNOSIS — K5909 Other constipation: Secondary | ICD-10-CM

## 2018-12-31 DIAGNOSIS — K0889 Other specified disorders of teeth and supporting structures: Secondary | ICD-10-CM

## 2018-12-31 DIAGNOSIS — M48062 Spinal stenosis, lumbar region with neurogenic claudication: Secondary | ICD-10-CM

## 2018-12-31 DIAGNOSIS — J4530 Mild persistent asthma, uncomplicated: Secondary | ICD-10-CM

## 2018-12-31 DIAGNOSIS — R232 Flushing: Secondary | ICD-10-CM

## 2018-12-31 MED ORDER — LIDOCAINE 5 % EX PTCH: 1.0000 | MEDICATED_PATCH | CUTANEOUS | 3 refills | Status: DC

## 2018-12-31 MED ORDER — ACETAMINOPHEN-CODEINE #3 300-30 MG PO TABS: 1.0000 | ORAL_TABLET | Freq: Two times a day (BID) | ORAL | 2 refills | Status: DC | PRN

## 2018-12-31 MED ORDER — CLONIDINE HCL 0.2 MG PO TABS: 0.2000 mg | ORAL_TABLET | Freq: Every day | ORAL | 2 refills | Status: DC

## 2018-12-31 MED ORDER — LACTULOSE 10 GM/15ML PO SOLN: 10.0000 g | Freq: Two times a day (BID) | ORAL | 1 refills | Status: DC | PRN

## 2018-12-31 MED ORDER — ALBUTEROL SULFATE HFA 108 (90 BASE) MCG/ACT IN AERS: 2.0000 | INHALATION_SPRAY | Freq: Four times a day (QID) | RESPIRATORY_TRACT | 3 refills | Status: DC | PRN

## 2018-12-31 MED ORDER — LEVOCETIRIZINE DIHYDROCHLORIDE 5 MG PO TABS: 5.0000 mg | ORAL_TABLET | Freq: Every evening | ORAL | 2 refills | Status: AC

## 2018-12-31 MED ORDER — CYCLOBENZAPRINE HCL 10 MG PO TABS: ORAL_TABLET | ORAL | 3 refills | Status: DC

## 2018-12-31 MED ORDER — FLUTICASONE-SALMETEROL 500-50 MCG/DOSE IN AEPB: 1.0000 | INHALATION_SPRAY | Freq: Two times a day (BID) | RESPIRATORY_TRACT | 3 refills | Status: DC

## 2018-12-31 MED ORDER — GABAPENTIN 300 MG PO CAPS: 600.0000 mg | ORAL_CAPSULE | Freq: Two times a day (BID) | ORAL | 3 refills | Status: DC

## 2018-12-31 MED ORDER — CLINDAMYCIN HCL 300 MG PO CAPS: 300.0000 mg | ORAL_CAPSULE | Freq: Three times a day (TID) | ORAL | 0 refills | Status: DC

## 2018-12-31 MED ORDER — MELOXICAM 7.5 MG PO TABS: 7.5000 mg | ORAL_TABLET | Freq: Every day | ORAL | 3 refills | Status: DC

## 2018-12-31 MED FILL — ACETAMINOPHEN/COD #3 TABLET: 300-30 | 30 days supply | Qty: 60 | Fill #0

## 2018-12-31 MED FILL — ?CLINDAMYCIN HCL 300 MG CAP: 300 | 10 days supply | Qty: 30 | Fill #0

## 2018-12-31 NOTE — Progress Notes (Signed)
Virtual Visit via Telephone Note  I connected with [name] on 12/31/18 at 10:30 AM EDT by telephone and verified that I am speaking with the correct person using two identifiers.   I discussed the limitations, risks, security and privacy concerns of performing an evaluation and management service by telephone and the availability of in person appointments. I also discussed with the patient that there may be a patient responsible charge related to this service. The patient expressed understanding and agreed to proceed.   History of Present Illness: [name]  is a 48 year old female with a history of asthma, hypertension, depression, chronic low back pain secondary to spinal stenosis, degenerative disc disease of the thoracic spine, cervical stenosis, hot flashes, chronic constipation who presents today for a follow-up visit. She has been out of her medications for the last 2 weeks and complains of low back pain radiating sitting down her lower extremities. PDMP reviewed and she has been compliant.  She also has tooth pain in her posterior upper premolar on anterior teeth; unable to see a dentist as she has no medical coverage but just recently got approved for the orange card and is waiting for the pandemic to be over prior to seeing one.  She is currently followed by [facility]ry at [facility] and reports control of her depression. She has no additional concerns today.  Observations/Objective: Alert, awake, oriented x3 Not in acute distress  Assessment and Plan: 1. Spinal stenosis, lumbar region, with neurogenic claudication Uncontrolled due to running out of medications which I have refilled - meloxicam (MOBIC) 7.5 MG tablet; Take 1 tablet (7.5 mg total) by mouth daily.  Dispense: 30 tablet; Refill: 3 - lidocaine (LIDODERM) 5 %; Place 1 patch onto the skin daily. Remove & Discard patch within 12 hours or as directed by MD  Dispense: 30 patch; Refill: 3 - gabapentin (NEURONTIN) 300  MG capsule; Take 2 capsules (600 mg total) by mouth 2 (two) times daily.  Dispense: 120 capsule; Refill: 3 - cyclobenzaprine (FLEXERIL) 10 MG tablet; TAKE 1 TABLET BY MOUTH 2 TIMES DAILY AS NEEDED FOR MUSCLE SPASMS.  Dispense: 60 tablet; Refill: 3 - acetaminophen-codeine (TYLENOL #3) 300-30 MG tablet; Take 1 tablet by mouth every 12 (twelve) hours as needed for moderate pain.  Dispense: 60 tablet; Refill: 2  2. Other constipation Controlled - lactulose (CHRONULAC) 10 GM/15ML solution; Take 15 mLs (10 g total) by mouth 2 (two) times daily as needed for mild constipation.  Dispense: 946 mL; Refill: 1  3. Mild persistent asthma without complication Stable No recent exacerbation - CMP14+EGFR; Future - Lipid panel; Future - Fluticasone-Salmeterol (ADVAIR DISKUS) 500-50 MCG/DOSE AEPB; Inhale 1 puff into the lungs 2 (two) times daily.  Dispense: 180 each; Refill: 3 - albuterol (VENTOLIN HFA) 108 (90 Base) MCG/ACT inhaler; Inhale 2 puffs into the lungs every 6 (six) hours as needed for wheezing or shortness of breath.  Dispense: 54 g; Refill: 3  4. Cervical disc disorder with radiculopathy of cervical region Uncontrolled due to running out of medications which have refilled - cyclobenzaprine (FLEXERIL) 10 MG tablet; TAKE 1 TABLET BY MOUTH 2 TIMES DAILY AS NEEDED FOR MUSCLE SPASMS.  Dispense: 60 tablet; Refill: 3  5. Hot flashes Stable - cloNIDine (CATAPRES) 0.2 MG tablet; Take 1 tablet (0.2 mg total) by mouth at bedtime.  Dispense: 30 tablet; Refill: 2  6. Tooth ache She will need to see a dentist Pen allergic so we will place on clindamycin - Ambulatory referral to Dentistry -  clindamycin (CLEOCIN) 300 MG capsule; Take 1 capsule (300 mg total) by mouth 3 (three) times daily.  Dispense: 30 capsule; Refill: 0   Follow Up Instructions:  Return for Follow-up of chronic medical conditions, call for an appointment.  I discussed the assessment and treatment plan with the patient. The patient was  provided an opportunity to ask questions and all were answered. The patient agreed with the plan and demonstrated an understanding of the instructions.   The patient was advised to call back or seek an in-person evaluation if the symptoms worsen or if the condition fails to improve as anticipated.  I provided 10 minutes of non-face-to-face time during this encounter.   Charlott Rakes, MD

## 2018-12-31 NOTE — Progress Notes (Signed)
Patient has been called and DOB has been verified. Patient has been screened and transferred to PCP to start phone visit.  C/C: Back and leg pain. Dental pain.

## 2019-01-01 NOTE — Telephone Encounter (Signed)
Medication has been filled 

## 2019-01-02 ENCOUNTER — Other Ambulatory Visit: Payer: Self-pay

## 2019-01-02 ENCOUNTER — Ambulatory Visit: Payer: Self-pay | Attending: Family Medicine

## 2019-01-02 DIAGNOSIS — J4530 Mild persistent asthma, uncomplicated: Secondary | ICD-10-CM

## 2019-01-03 LAB — CMP14+EGFR
ALT: 11 IU/L (ref 0–32)
AST: 15 IU/L (ref 0–40)
Albumin/Globulin Ratio: 1.8 (ref 1.2–2.2)
Albumin: 3.9 g/dL (ref 3.8–4.8)
Alkaline Phosphatase: 109 IU/L (ref 39–117)
BUN/Creatinine Ratio: 8 — ABNORMAL LOW (ref 9–23)
BUN: 7 mg/dL (ref 6–24)
Bilirubin Total: <0.2 mg/dL (ref 0.0–1.2)
CO2: 24 mmol/L (ref 20–29)
Calcium: 8.8 mg/dL (ref 8.7–10.2)
Chloride: 104 mmol/L (ref 96–106)
Creatinine, Ser: 0.89 mg/dL (ref 0.57–1.00)
GFR calc Af Amer: 88 mL/min/{1.73_m2} (ref >59)
GFR calc non Af Amer: 76 mL/min/{1.73_m2} (ref >59)
Globulin, Total: 2.2 g/dL (ref 1.5–4.5)
Glucose: 99 mg/dL (ref 65–99)
Potassium: 4.2 mmol/L (ref 3.5–5.2)
Sodium: 144 mmol/L (ref 134–144)
Total Protein: 6.1 g/dL (ref 6.0–8.5)

## 2019-01-03 LAB — LIPID PANEL
Chol/HDL Ratio: 3.8 ratio (ref 0.0–4.4)
Cholesterol, Total: 159 mg/dL (ref 100–199)
HDL: 42 mg/dL (ref >39)
LDL Calculated: 90 mg/dL (ref 0–99)
Triglycerides: 137 mg/dL (ref 0–149)
VLDL Cholesterol Cal: 27 mg/dL (ref 5–40)

## 2019-01-12 ENCOUNTER — Telehealth: Payer: Self-pay

## 2019-01-12 NOTE — Telephone Encounter (Signed)
Patient was called and a voicemail was left informing patient to return phone call. 

## 2019-01-12 NOTE — Telephone Encounter (Signed)
-----   Message from Charlott Rakes, MD sent at 01/05/2019  1:34 PM EDT ----- Please inform the patient that labs are normal. Thank you.

## 2019-01-15 ENCOUNTER — Other Ambulatory Visit: Payer: Self-pay | Admitting: Family Medicine

## 2019-01-15 DIAGNOSIS — F331 Major depressive disorder, recurrent, moderate: Secondary | ICD-10-CM

## 2019-01-15 DIAGNOSIS — R600 Localized edema: Secondary | ICD-10-CM

## 2019-01-15 DIAGNOSIS — F419 Anxiety disorder, unspecified: Secondary | ICD-10-CM

## 2019-01-15 DIAGNOSIS — G4709 Other insomnia: Secondary | ICD-10-CM

## 2019-01-15 MED FILL — GABAPENTIN 300 MG CAPSULE: 300 | 30 days supply | Qty: 120 | Fill #3

## 2019-01-15 MED FILL — traZODone HCL 50 MG TABS: 50 | 30 days supply | Qty: 30 | Fill #0

## 2019-01-15 MED FILL — MELOXICAM 7.5 MG TABLET: 7.5 | 30 days supply | Qty: 30 | Fill #0

## 2019-01-15 MED FILL — CYCLOBENZAPRINE 10 MG TAB: 10 | 30 days supply | Qty: 60 | Fill #3

## 2019-01-15 MED FILL — cloNIDine HCL 0.2 MG TABS: 0.2 | 30 days supply | Qty: 30 | Fill #3

## 2019-01-15 MED FILL — FLUoxetine HCL 40 MG CAPS: 40 | 30 days supply | Qty: 30 | Fill #0

## 2019-01-15 MED FILL — $ADVAIR 500/50MCG INHALER: 500-50 | 60 days supply | Qty: 120 | Fill #0

## 2019-01-15 MED FILL — ?FUROSEMIDE 20 MG TABLET: 20 | 30 days supply | Qty: 30 | Fill #0

## 2019-01-15 MED FILL — $VENTOLIN HFA 18G INHALER: 108 (90 BAS | 25 days supply | Qty: 18 | Fill #0

## 2019-01-15 MED FILL — ?BUSPIRONE HCL 15 MG TABLET: 15 | 30 days supply | Qty: 30 | Fill #0

## 2019-01-15 MED FILL — ?LIDOCAINE 5% PATCH: 5 | 30 days supply | Qty: 30 | Fill #3

## 2019-02-19 MED FILL — FLUoxetine HCL 40 MG CAPS: 40 | 30 days supply | Qty: 30 | Fill #1

## 2019-02-19 MED FILL — ?FUROSEMIDE 20 MG TABLET: 20 | 30 days supply | Qty: 30 | Fill #1

## 2019-02-19 MED FILL — $VENTOLIN HFA 18G INHALER: 108 (90 BAS | 25 days supply | Qty: 18 | Fill #1

## 2019-02-19 MED FILL — traZODone HCL 50 MG TABS: 50 | 30 days supply | Qty: 30 | Fill #1

## 2019-02-19 MED FILL — ?CLONIDINE HCL 0.1 MG TABL: 0.1 | 30 days supply | Qty: 60 | Fill #0

## 2019-02-19 MED FILL — ACETAMINOPHEN/COD #3 TABLET: 300-30 | 30 days supply | Qty: 60 | Fill #1

## 2019-02-19 MED FILL — ?BUSPIRONE HCL 15 MG TABLET: 15 | 30 days supply | Qty: 30 | Fill #1

## 2019-03-08 ENCOUNTER — Other Ambulatory Visit: Payer: Self-pay

## 2019-03-08 ENCOUNTER — Emergency Department (HOSPITAL_COMMUNITY)
Admission: EM | Admit: 2019-03-08 | Discharge: 2019-03-08 | Disposition: A | Discharge status: Home / Self Care | Payer: Self-pay | Attending: Emergency Medicine | Admitting: Emergency Medicine

## 2019-03-08 DIAGNOSIS — G8929 Other chronic pain: Secondary | ICD-10-CM | POA: Insufficient documentation

## 2019-03-08 DIAGNOSIS — J45909 Unspecified asthma, uncomplicated: Secondary | ICD-10-CM | POA: Insufficient documentation

## 2019-03-08 DIAGNOSIS — F17210 Nicotine dependence, cigarettes, uncomplicated: Secondary | ICD-10-CM | POA: Insufficient documentation

## 2019-03-08 DIAGNOSIS — Z880 Allergy status to penicillin: Secondary | ICD-10-CM | POA: Insufficient documentation

## 2019-03-08 DIAGNOSIS — M5440 Lumbago with sciatica, unspecified side: Secondary | ICD-10-CM | POA: Insufficient documentation

## 2019-03-08 DIAGNOSIS — D571 Sickle-cell disease without crisis: Secondary | ICD-10-CM | POA: Insufficient documentation

## 2019-03-08 MED ORDER — PREDNISONE 20 MG PO TABS: 40.0000 mg | ORAL_TABLET | Freq: Every day | ORAL | 0 refills | Status: DC

## 2019-03-08 NOTE — ED Triage Notes (Signed)
Pt reports bilateral leg pain that started about a week ago. Pt reports that she "feels like my legs could give out at anytime." Pt denies any injury to the area, but states any time she bumps any part of her leg she gets sharp, shooting pains down her legs. Pt also reporting some lower back pain with the leg pain. Pt reports 9/10 bilateral leg pain at rest.

## 2019-03-08 NOTE — Discharge Instructions (Signed)
I have prescribed a short course of steroids to help with your chronic back pain, please take 2 tablets daily for the next 5 days.  Please follow-up with your primary care physician for further management.  If you experience any fever, bowel or bladder incontinence please return to the emergency department.  Please note steroids can cause flushness, changes in appetite, insomnia.

## 2019-03-08 NOTE — ED Notes (Signed)
Patient verbalizes understanding of discharge instructions. Opportunity for questioning and answers were provided. Armband removed by staff, pt discharged from ED. Ambulated out to lobby  

## 2019-03-08 NOTE — ED Notes (Signed)
Caregiver- [phone]

## 2019-03-08 NOTE — ED Provider Notes (Signed)
[facility] EMERGENCY DEPARTMENT Provider Note   CSN: [id number] Arrival date & time: 03/08/19  0753    History   Chief Complaint Chief Complaint  Patient presents with  . Leg Pain    HPI [name] is a 48 y.o. female.     48 y.o female with a PMH of Sickle cell trait, asthma presents to the ED with a chief complaint of recurrent lumbar back pain. Patient reports this is a chronic complaint for her which has seem to progressively worse. She reports pain along the lumbar region with radiation to bilateral legs. States this pain is worse with movement and ambulation. She is currently followed for this complaint by her PCP who prescribed tylenol #3 on 12/31/2018 for a total of 60 quantities, I have personally reviewed patient's file to obtain this information. She is also currently on gabapentin for this chronic complaint. Patient walks with a cane at baseline. She denies any urinary retention, bowel incontinence, fever, IVDU or saddle anesthesia.   The history is provided by medical records and the patient.    Past Medical History:  Diagnosis Date  . Allergy    pollen, pcn  . Asthma   . Depression   . Fibroids   . History of ovarian cyst   . Medical history non-contributory    sickle cell  . Menometrorrhagia   . 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 Sickle cell trait [facility])     Patient Active Problem List   Diagnosis Date Noted  . Tobacco abuse 09/15/2018  . Visual disturbance 04/24/2018  . Obesity (BMI 35.0-39.9 without comorbidity) 12/12/2017  . Insomnia 03/13/2017  . Anxiety associated with depression 01/09/2017  . Tooth ache 05/24/2016  . Spinal stenosis, lumbar region, with neurogenic claudication 05/15/2016  . Cervical disc disorder with radiculopathy of cervical region 05/15/2016  . Vaginal atrophy 04/30/2016  . Asthma 02/20/2016  . Depression 02/20/2016  . Myopathy 02/20/2016  . Back pain 02/20/2016  . Neuropathy 02/20/2016   . Fibroids   . History of ovarian cyst   . Sickle cell trait ([place])   . Menometrorrhagia 04/09/2011    Past Surgical History:  Procedure Laterality Date  . CESAREAN SECTION     all three pregnancies     OB History    Gravida  3   Para  3   Term  0   Preterm      AB      Living  3     SAB      TAB      Ectopic      Multiple      Live Births               Home Medications    Prior to Admission medications   Medication Sig Start Date End Date Taking? Authorizing Provider  acetaminophen-codeine (TYLENOL #3) 300-30 MG tablet Take 1 tablet by mouth every 12 (twelve) hours as needed for moderate pain. 12/31/18   Charlott Rakes, MD  albuterol (VENTOLIN HFA) 108 (90 Base) MCG/ACT inhaler Inhale 2 puffs into the lungs every 6 (six) hours as needed for wheezing or shortness of breath. 12/31/18   Charlott Rakes, MD  busPIRone (BUSPAR) 15 MG tablet TAKE 1 TABLET BY MOUTH DAILY. 01/15/19   Charlott Rakes, MD  cariprazine (VRAYLAR) capsule Take by mouth daily.    [provider]  clindamycin (CLEOCIN) 300 MG capsule Take 1 capsule (300 mg total) by  mouth 3 (three) times daily. 12/31/18   Charlott Rakes, MD  cloNIDine (CATAPRES) 0.2 MG tablet Take 1 tablet (0.2 mg total) by mouth at bedtime. 12/31/18   Charlott Rakes, MD  clotrimazole (LOTRIMIN) 1 % cream Apply 1 application topically 2 (two) times daily. 09/13/17   Charlott Rakes, MD  cyclobenzaprine (FLEXERIL) 10 MG tablet TAKE 1 TABLET BY MOUTH 2 TIMES DAILY AS NEEDED FOR MUSCLE SPASMS. 12/31/18   Charlott Rakes, MD  FLUoxetine (PROZAC) 40 MG capsule TAKE 1 CAPSULE BY MOUTH DAILY. 01/15/19   Charlott Rakes, MD  Fluticasone-Salmeterol (ADVAIR DISKUS) 500-50 MCG/DOSE AEPB Inhale 1 puff into the lungs 2 (two) times daily. 12/31/18   Charlott Rakes, MD  furosemide (LASIX) 20 MG tablet TAKE 1 TABLET BY MOUTH DAILY AS NEEDED FOR PEDAL EDEMA 01/15/19   Charlott Rakes, MD  gabapentin (NEURONTIN) 300 MG capsule Take 2  capsules (600 mg total) by mouth 2 (two) times daily. 12/31/18   Charlott Rakes, MD  lactulose (CHRONULAC) 10 GM/15ML solution Take 15 mLs (10 g total) by mouth 2 (two) times daily as needed for mild constipation. 12/31/18   Charlott Rakes, MD  levocetirizine (XYZAL) 5 MG tablet Take 1 tablet (5 mg total) by mouth every evening. 12/31/18   Charlott Rakes, MD  lidocaine (LIDODERM) 5 % Place 1 patch onto the skin daily. Remove & Discard patch within 12 hours or as directed by MD 12/31/18   Charlott Rakes, MD  meloxicam (MOBIC) 7.5 MG tablet Take 1 tablet (7.5 mg total) by mouth daily. 12/31/18   Charlott Rakes, MD  nystatin (MYCOSTATIN) 100000 UNIT/ML suspension Take 5 mLs (500,000 Units total) by mouth 4 (four) times daily. 10/21/18   Kerin Perna, NP  predniSONE (DELTASONE) 20 MG tablet Take 2 tablets (40 mg total) by mouth daily for 5 days. 03/08/19 03/13/19  Janeece Fitting, PA-C  traZODone (DESYREL) 50 MG tablet TAKE 1 TABLET (50 MG TOTAL) BY MOUTH AT BEDTIME AS NEEDED. FOR SLEEP 01/15/19   Charlott Rakes, MD  triamcinolone cream (KENALOG) 0.1 % APPLY TOPICALLY 2 (TWO) TIMES DAILY. 06/03/18   Charlott Rakes, MD    Family History Family History  Problem Relation Age of Onset  . Cancer Mother        colon cancer  . Hypertension Mother   . Heart disease Father   . Cancer Father        cancer  . Other Neg Hx     Social History Social History   Tobacco Use  . Smoking status: Current Every Day Smoker    Packs/day: 0.25    Types: Cigarettes    Last attempt to quit: 02/14/2016    Years since quitting: 3.0  . Smokeless tobacco: Never Used  . Tobacco comment: 1 cig daily 09/13/17  Substance Use Topics  . Alcohol use: No  . Drug use: No     Allergies   Bupropion hcl er (sr), Fruit & vegetable daily [nutritional supplements], Penicillins, Pollen extract-tree extract, and Other   Review of Systems Review of Systems  Constitutional: Negative for fever.  Musculoskeletal: Positive  for back pain and myalgias.     Physical Exam Updated Vital Signs BP 103/71 (BP Location: Right Arm)   Pulse 88   Temp 98.1 F (36.7 C) (Oral)   Resp 20   Ht 5\' 1"  (1.549 m)   SpO2 100%   BMI 41.19 kg/m   Physical Exam Vitals signs and nursing note reviewed.  Constitutional:      General: She  is not in acute distress.    Appearance: She is well-developed.  HENT:     Head: Normocephalic and atraumatic.     Mouth/Throat:     Pharynx: No oropharyngeal exudate.  Eyes:     Pupils: Pupils are equal, round, and reactive to light.  Neck:     Musculoskeletal: Normal range of motion.  Cardiovascular:     Rate and Rhythm: Regular rhythm.     Heart sounds: Normal heart sounds.  Pulmonary:     Effort: Pulmonary effort is normal. No respiratory distress.     Breath sounds: Normal breath sounds.  Abdominal:     General: Bowel sounds are normal. There is no distension.     Palpations: Abdomen is soft.     Tenderness: There is no abdominal tenderness.  Musculoskeletal:        General: No tenderness or deformity.     Lumbar back: She exhibits pain and spasm. She exhibits no edema.       Back:     Right lower leg: No edema.     Left lower leg: No edema.     Comments: RLE- KF,KE 5/5 strength LLE- HF, HE 5/5 strength Normal gait. No pronator drift. No leg drop.  Patellar reflexes present and symmetric. CN I, II and VIII not tested. CN II-XII grossly intact bilaterally.     Skin:    General: Skin is warm and dry.  Neurological:     Mental Status: She is alert and oriented to person, place, and time.      ED Treatments / Results  Labs (all labs ordered are listed, but only abnormal results are displayed) Labs Reviewed - No data to display  EKG None  Radiology No results found.  Procedures Procedures (including critical care time)  Medications Ordered in ED Medications - No data to display   Initial Impression / Assessment and Plan / ED Course  I have reviewed  the triage vital signs and the nursing notes.  Pertinent labs & imaging results that were available during my care of the patient were reviewed by me and considered in my medical decision making (see chart for details).    Patient with a past medical history of chronic lumbar back pain presents to the ED with a chief complaint of worsening lower back pain.  Patient recently seen by primary care physician on December 31, 2018 given a prescription for Tylenol threes a quantity total of 60 pills.  Patient reports this has not been helping with her pain.  She also takes gabapentin and Flexeril for her pain without improvement.  These records were obtained from patient's chart after review.  During primary evaluation patient is well-appearing, sitting on stretcher texting on phone in no distress.  Pain along the lower lumbar area, strength is unremarkable.  Patient does walk with a cane at baseline.  Imaging reviewed from her MR lumbar on 2017 which showed some moderate bilateral L3-L4 and L4-L5 neural foraminal narrowing.  Does have mild degenerative changes on MRI.  Recommended the patient follow-up with a neurosurgeon to further help with her management.  At this time I feel patient would benefit from a short course of steroids to help with her symptoms.  Advised her that this would not be the ultimate solution and she would need to follow-up with PCP.  She reports she does have an appointment on Tuesday with her PCP Dr. Charlane Ferretti.  No red flags during today visit such as urinary retention, bowel incontinence,  fever, denies any IV drug use or saddle anesthesia.  Return precautions provided at length.   Portions of this note were generated with L[employer]. Dictation errors may occur despite best attempts at proofreading.  Final Clinical Impressions(s) / ED Diagnoses   Final diagnoses:  Chronic midline low back pain with sciatica, sciatica laterality unspecified    ED Discharge Orders          Ordered    predniSONE (DELTASONE) 20 MG tablet  Daily     03/08/19 0837           Janeece Fitting, PA-C 03/08/19 0901    Valarie Merino, MD 03/09/19 [phone]

## 2019-03-09 MED FILL — ?PREDNISONE 10 MG TABLET: 10 | 5 days supply | Qty: 20 | Fill #0

## 2019-03-10 ENCOUNTER — Ambulatory Visit: Payer: Self-pay | Admitting: Family Medicine

## 2019-03-11 ENCOUNTER — Encounter: Payer: Self-pay | Admitting: Family Medicine

## 2019-03-11 ENCOUNTER — Other Ambulatory Visit: Payer: Self-pay

## 2019-03-11 ENCOUNTER — Ambulatory Visit: Payer: Self-pay | Attending: Family Medicine | Admitting: Family Medicine

## 2019-03-11 VITALS — BP 121/82 | HR 78 | Temp 98.1°F | Wt 224.8 lb

## 2019-03-11 DIAGNOSIS — F329 Major depressive disorder, single episode, unspecified: Secondary | ICD-10-CM

## 2019-03-11 DIAGNOSIS — F32A Depression, unspecified: Secondary | ICD-10-CM

## 2019-03-11 DIAGNOSIS — M48062 Spinal stenosis, lumbar region with neurogenic claudication: Secondary | ICD-10-CM

## 2019-03-11 DIAGNOSIS — Z6841 Body Mass Index (BMI) 40.0 and over, adult: Secondary | ICD-10-CM

## 2019-03-11 DIAGNOSIS — Z720 Tobacco use: Secondary | ICD-10-CM

## 2019-03-11 DIAGNOSIS — M5010 Cervical disc disorder with radiculopathy, unspecified cervical region: Secondary | ICD-10-CM

## 2019-03-11 DIAGNOSIS — F419 Anxiety disorder, unspecified: Secondary | ICD-10-CM

## 2019-03-11 DIAGNOSIS — Z131 Encounter for screening for diabetes mellitus: Secondary | ICD-10-CM

## 2019-03-11 DIAGNOSIS — R232 Flushing: Secondary | ICD-10-CM

## 2019-03-11 DIAGNOSIS — G4709 Other insomnia: Secondary | ICD-10-CM

## 2019-03-11 DIAGNOSIS — J4530 Mild persistent asthma, uncomplicated: Secondary | ICD-10-CM

## 2019-03-11 LAB — POCT GLYCOSYLATED HEMOGLOBIN (HGB A1C): HbA1c, POC (prediabetic range): 5.3 % — AB (ref 5.7–6.4)

## 2019-03-11 MED ORDER — CLONIDINE HCL 0.2 MG PO TABS: 0.2000 mg | ORAL_TABLET | Freq: Every day | ORAL | 2 refills | Status: DC

## 2019-03-11 MED ORDER — NICOTINE 7 MG/24HR TD PT24: 7.0000 mg | MEDICATED_PATCH | Freq: Every day | TRANSDERMAL | 1 refills | Status: DC

## 2019-03-11 MED ORDER — ACETAMINOPHEN-CODEINE #3 300-30 MG PO TABS: 1.0000 | ORAL_TABLET | Freq: Two times a day (BID) | ORAL | 2 refills | Status: DC | PRN

## 2019-03-11 MED ORDER — MELOXICAM 7.5 MG PO TABS: 7.5000 mg | ORAL_TABLET | Freq: Every day | ORAL | 3 refills | Status: DC

## 2019-03-11 MED ORDER — TRAZODONE HCL 50 MG PO TABS: 50.0000 mg | ORAL_TABLET | Freq: Every evening | ORAL | 2 refills | Status: DC | PRN

## 2019-03-11 MED ORDER — ALBUTEROL SULFATE HFA 108 (90 BASE) MCG/ACT IN AERS: 2.0000 | INHALATION_SPRAY | Freq: Four times a day (QID) | RESPIRATORY_TRACT | 3 refills | Status: DC | PRN

## 2019-03-11 MED ORDER — BUSPIRONE HCL 15 MG PO TABS: 15.0000 mg | ORAL_TABLET | Freq: Every day | ORAL | 2 refills | Status: DC

## 2019-03-11 MED ORDER — CYCLOBENZAPRINE HCL 10 MG PO TABS: ORAL_TABLET | ORAL | 3 refills | Status: DC

## 2019-03-11 MED ORDER — GABAPENTIN 300 MG PO CAPS: 600.0000 mg | ORAL_CAPSULE | Freq: Two times a day (BID) | ORAL | 3 refills | Status: DC

## 2019-03-11 MED ORDER — FLUTICASONE-SALMETEROL 500-50 MCG/DOSE IN AEPB: 1.0000 | INHALATION_SPRAY | Freq: Two times a day (BID) | RESPIRATORY_TRACT | 3 refills | Status: DC

## 2019-03-11 MED ORDER — DULOXETINE HCL 60 MG PO CPEP: 60.0000 mg | ORAL_CAPSULE | Freq: Every day | ORAL | 3 refills | Status: DC

## 2019-03-11 MED FILL — CYCLOBENZAPRINE 10 MG TAB: 10 | 30 days supply | Qty: 60 | Fill #0

## 2019-03-11 MED FILL — GABAPENTIN 300 MG CAPSULE: 300 | 30 days supply | Qty: 120 | Fill #0

## 2019-03-11 MED FILL — NICOTINE 7 MG/24HR PATCH: 7 | 28 days supply | Qty: 28 | Fill #0

## 2019-03-11 MED FILL — cloNIDine HCL 0.2 MG TABS: 0.2 | 30 days supply | Qty: 30 | Fill #0

## 2019-03-11 MED FILL — ?DULoxetine HCL 60 MG CPEP: 60 | 30 days supply | Qty: 30 | Fill #0

## 2019-03-11 MED FILL — MELOXICAM 7.5 MG TABLET: 7.5 | 30 days supply | Qty: 30 | Fill #0

## 2019-03-11 MED FILL — $ADVAIR 500/50MCG INHALER: 500-50 | 30 days supply | Qty: 60 | Fill #0

## 2019-03-11 NOTE — Progress Notes (Signed)
Patient is having pain in legs and back. Patient had banana for breakfast and took morning meds.

## 2019-03-11 NOTE — Patient Instructions (Signed)

## 2019-03-11 NOTE — Progress Notes (Signed)
Subjective:  Patient ID: [name], female    DOB: 11/19/1970  Age: 48 y.o. MRN: [id number]  CC: Back Pain and Leg Pain   HPI [name] is a 48 year old female with a history of asthma, hypertension, depression, chronic low back pain secondary to spinal stenosis, degenerative disc disease of the thoracic spine, cervical stenosis, hot flashes, chronic constipation who presents today for a follow-up visit. She had an ED visit 3 days ago after her legs gave out on her for which she was prescribed prednisone with some improvement in her symptoms.  She has been compliant with Tylenol 3 and is currently on a pain contract.  The plan had been to refer her to physical rehab medicine however lack of medical coverage and lack of: Financial discount has precluded this. I have discussed with her again and again the need to apply for the [facility] financial discount to facilitate this referral. Pain radiates down her lower extremities bilaterally and she ambulates with the aid of a cane.  Denies loss of sphincteric function.  She has gained 40 pounds in the last 1 year and does not exercise regularly and is not making any active effort with regards to her diet.  She endorses eating veggie straws but states she does not eat other snacks. Her depression is stable and she is followed by psychiatry at [facility] but has not been there in a while.  Denies suicidal ideations or intent. Her asthma is stable. She denies chest pain, pedal edema is stable and she uses Lasix as needed.  Past Medical History:  Diagnosis Date  . Allergy    pollen, pcn  . Asthma   . Depression   . Fibroids   . History of ovarian cyst   . Medical history non-contributory    sickle cell  . Menometrorrhagia   . 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 Sickle cell trait [facility])     Past Surgical History:  Procedure Laterality Date  . CESAREAN SECTION     all three pregnancies    Family History  Problem Relation Age  of Onset  . Cancer Mother        colon cancer  . Hypertension Mother   . Heart disease Father   . Cancer Father        cancer  . Other Neg Hx     Allergies  Allergen Reactions  . Bupropion Hcl Er (Sr) Hives and Other (See Comments)    The patient has an entire page of reactions that can be caused by the medication, but none that she has suffered.  . Fruit & Vegetable Daily [Nutritional Supplements] Other (See Comments)    "all fruits causes bumps and weird feeling in her mouth"  . Penicillins Hives  . Pollen Extract-Tree Extract Other (See Comments)    Allergies  . Other Rash    "bugs" roaches    Outpatient Medications Prior to Visit  Medication Sig Dispense Refill  . cariprazine (VRAYLAR) capsule Take by mouth daily.    . clindamycin (CLEOCIN) 300 MG capsule Take 1 capsule (300 mg total) by mouth 3 (three) times daily. 30 capsule 0  . clotrimazole (LOTRIMIN) 1 % cream Apply 1 application topically 2 (two) times daily. 45 g 1  . furosemide (LASIX) 20 MG tablet TAKE 1 TABLET BY MOUTH DAILY AS NEEDED FOR PEDAL EDEMA 30 tablet 2  . lactulose (CHRONULAC) 10 GM/15ML solution Take 15 mLs (10 g total) by  mouth 2 (two) times daily as needed for mild constipation. 946 mL 1  . levocetirizine (XYZAL) 5 MG tablet Take 1 tablet (5 mg total) by mouth every evening. 30 tablet 2  . lidocaine (LIDODERM) 5 % Place 1 patch onto the skin daily. Remove & Discard patch within 12 hours or as directed by MD 30 patch 3  . nystatin (MYCOSTATIN) 100000 UNIT/ML suspension Take 5 mLs (500,000 Units total) by mouth 4 (four) times daily. 120 mL 0  . triamcinolone cream (KENALOG) 0.1 % APPLY TOPICALLY 2 (TWO) TIMES DAILY. 30 g 1  . acetaminophen-codeine (TYLENOL #3) 300-30 MG tablet Take 1 tablet by mouth every 12 (twelve) hours as needed for moderate pain. 60 tablet 2  . albuterol (VENTOLIN HFA) 108 (90 Base) MCG/ACT inhaler Inhale 2 puffs into the lungs every 6 (six) hours as needed for wheezing or shortness of  breath. 54 g 3  . busPIRone (BUSPAR) 15 MG tablet TAKE 1 TABLET BY MOUTH DAILY. 30 tablet 2  . cloNIDine (CATAPRES) 0.2 MG tablet Take 1 tablet (0.2 mg total) by mouth at bedtime. 30 tablet 2  . cyclobenzaprine (FLEXERIL) 10 MG tablet TAKE 1 TABLET BY MOUTH 2 TIMES DAILY AS NEEDED FOR MUSCLE SPASMS. 60 tablet 3  . FLUoxetine (PROZAC) 40 MG capsule TAKE 1 CAPSULE BY MOUTH DAILY. 30 capsule 2  . Fluticasone-Salmeterol (ADVAIR DISKUS) 500-50 MCG/DOSE AEPB Inhale 1 puff into the lungs 2 (two) times daily. 180 each 3  . gabapentin (NEURONTIN) 300 MG capsule Take 2 capsules (600 mg total) by mouth 2 (two) times daily. 120 capsule 3  . meloxicam (MOBIC) 7.5 MG tablet Take 1 tablet (7.5 mg total) by mouth daily. 30 tablet 3  . predniSONE (DELTASONE) 20 MG tablet Take 2 tablets (40 mg total) by mouth daily for 5 days. 10 tablet 0  . traZODone (DESYREL) 50 MG tablet TAKE 1 TABLET (50 MG TOTAL) BY MOUTH AT BEDTIME AS NEEDED. FOR SLEEP 30 tablet 2   No facility-administered medications prior to visit.      ROS Review of Systems  Constitutional: Positive for unexpected weight change. Negative for activity change, appetite change and fatigue.  HENT: Negative for congestion, sinus pressure and sore throat.   Eyes: Negative for visual disturbance.  Respiratory: Negative for cough, chest tightness, shortness of breath and wheezing.   Cardiovascular: Negative for chest pain and palpitations.  Gastrointestinal: Negative for abdominal distention, abdominal pain and constipation.  Endocrine: Negative for polydipsia.  Genitourinary: Negative for dysuria and frequency.  Musculoskeletal:       See hpi  Skin: Negative for rash.  Neurological: Negative for tremors, light-headedness and numbness.  Hematological: Does not bruise/bleed easily.  Psychiatric/Behavioral: Negative for agitation and behavioral problems.    Objective:  BP 121/82   Pulse 78   Temp 98.1 F (36.7 C) (Oral)   Wt 224 lb 12.8 oz (102  kg)   BMI 42.48 kg/m   BP/Weight 03/11/2019 12/24/1446 09/24/5629  Systolic BP 497 026 378  Diastolic BP 82 71 81  Wt. (Lbs) 224.8 - 218  BMI 42.48 41.19 41.19  Some encounter information is confidential and restricted. Go to Review Flowsheets activity to see all data.      Physical Exam Constitutional:      Appearance: She is well-developed. She is obese.  Cardiovascular:     Rate and Rhythm: Normal rate.     Heart sounds: Normal heart sounds. No murmur.  Pulmonary:     Effort: Pulmonary effort is  normal.     Breath sounds: Normal breath sounds. No wheezing or rales.  Chest:     Chest wall: No tenderness.  Abdominal:     General: Bowel sounds are normal. There is no distension.     Palpations: Abdomen is soft. There is no mass.     Tenderness: There is no abdominal tenderness.  Musculoskeletal:     Comments: Tenderness across lumbar spine Positive straight leg raise bilaterally Restricted flexion of both knees  Neurological:     Mental Status: She is alert and oriented to person, place, and time.     CMP Latest Ref Rng & Units 01/02/2019 12/12/2017 06/12/2017  Glucose 65 - 99 mg/dL 99 78 87  BUN 6 - 24 mg/dL 7 6 5(L)  Creatinine 0.57 - 1.00 mg/dL 0.89 0.98 0.92  Sodium 134 - 144 mmol/L 144 142 139  Potassium 3.5 - 5.2 mmol/L 4.2 3.7 3.7  Chloride 96 - 106 mmol/L 104 102 103  CO2 20 - 29 mmol/L 24 27 24   Calcium 8.7 - 10.2 mg/dL 8.8 9.2 8.8  Total Protein 6.0 - 8.5 g/dL 6.1 6.8 6.7  Total Bilirubin 0.0 - 1.2 mg/dL <0.2 0.5 0.2  Alkaline Phos 39 - 117 IU/L 109 91 73  AST 0 - 40 IU/L 15 21 20   ALT 0 - 32 IU/L 11 16 10     Lipid Panel     Component Value Date/Time   CHOL 159 01/02/2019 0925   TRIG 137 01/02/2019 0925   HDL 42 01/02/2019 0925   CHOLHDL 3.8 01/02/2019 0925   CHOLHDL 3.0 05/15/2016 0949   VLDL 13 05/15/2016 0949   LDLCALC 90 01/02/2019 0925    CBC    Component Value Date/Time   WBC 7.2 04/18/2015 1136   RBC 4.44 04/18/2015 1136   HGB 12.6  08/02/2016 1806   HCT 37.0 08/02/2016 1806   PLT 314 04/18/2015 1136   MCV 78.6 04/18/2015 1136   MCH 27.7 04/18/2015 1136   MCHC 35.2 04/18/2015 1136   RDW 13.4 04/18/2015 1136   LYMPHSABS 1.6 06/26/2010 2057   MONOABS 0.4 06/26/2010 2057   EOSABS 0.1 06/26/2010 2057   BASOSABS 0.0 06/26/2010 2057    Lab Results  Component Value Date   HGBA1C 4.8 12/12/2017    Assessment & Plan:   1. Anxiety and depression Controlled She is followed by psychiatry at [facility] and is currently on Vraylar Due for an appointment but she has not made it back there We will switch from Prozac to Cymbalta as the latter will help with analgesia - busPIRone (BUSPAR) 15 MG tablet; Take 1 tablet (15 mg total) by mouth daily.  Dispense: 30 tablet; Refill: 2 - DULoxetine (CYMBALTA) 60 MG capsule; Take 1 capsule (60 mg total) by mouth daily.  Dispense: 30 capsule; Refill: 3  2. Cervical disc disorder with radiculopathy of cervical region Uncontrolled She will benefit from seeing a spine surgeon but is yet to obtain the [place] discount to facilitate referral-I have emphasized the need for her to stop at the front desk to obtain this financial application package Hopefully addition of Cymbalta will be beneficial - cyclobenzaprine (FLEXERIL) 10 MG tablet; TAKE 1 TABLET BY MOUTH 2 TIMES DAILY AS NEEDED FOR MUSCLE SPASMS.  Dispense: 60 tablet; Refill: 3 - Drug Screen 12+Alcohol+CRT, Ur  3. Spinal stenosis, lumbar region, with neurogenic claudication See #2 above Strongly encouraged to lose weight as weight gain of over 40 pounds in last 1 year is also contributory  Currently on a pain contract - PDMP She has been compliant - cyclobenzaprine (FLEXERIL) 10 MG tablet; TAKE 1 TABLET BY MOUTH 2 TIMES DAILY AS NEEDED FOR MUSCLE SPASMS.  Dispense: 60 tablet; Refill: 3 - acetaminophen-codeine (TYLENOL #3) 300-30 MG tablet; Take 1 tablet by mouth every 12 (twelve) hours as needed for moderate pain.  Dispense: 60  tablet; Refill: 2 - gabapentin (NEURONTIN) 300 MG capsule; Take 2 capsules (600 mg total) by mouth 2 (two) times daily.  Dispense: 120 capsule; Refill: 3 - meloxicam (MOBIC) 7.5 MG tablet; Take 1 tablet (7.5 mg total) by mouth daily.  Dispense: 30 tablet; Refill: 3  4. Hot flashes Stable - cloNIDine (CATAPRES) 0.2 MG tablet; Take 1 tablet (0.2 mg total) by mouth at bedtime.  Dispense: 30 tablet; Refill: 2  5. Mild persistent asthma without complication No acute exacerbations - Fluticasone-Salmeterol (ADVAIR DISKUS) 500-50 MCG/DOSE AEPB; Inhale 1 puff into the lungs 2 (two) times daily.  Dispense: 180 each; Refill: 3 - albuterol (VENTOLIN HFA) 108 (90 Base) MCG/ACT inhaler; Inhale 2 puffs into the lungs every 6 (six) hours as needed for wheezing or shortness of breath.  Dispense: 54 g; Refill: 3  6. Other insomnia Controlled - traZODone (DESYREL) 50 MG tablet; Take 1 tablet (50 mg total) by mouth at bedtime as needed. for sleep  Dispense: 30 tablet; Refill: 2  7. Class 3 severe obesity due to excess calories with serious comorbidity and body mass index (BMI) of 40.0 to 44.9 in adult (HCC) Weight gain of 40 pounds in the last 1 year Counseled extensively on reducing portion sizes, avoiding late meals, exercising, with cutting out sweets If weight gain persist I will send a note to her psychiatrist regarding Arman Filter which could be the culprit as well  8. Screening for diabetes mellitus A1c 5.3  9. Tobacco abuse Counseled on smoking cessation and she is not ready to quit - nicotine (NICODERM CQ) 7 mg/24hr patch; Place 1 patch (7 mg total) onto the skin daily.  Dispense: 28 patch; Refill: 1   Meds ordered this encounter  Medications  . nicotine (NICODERM CQ) 7 mg/24hr patch    Sig: Place 1 patch (7 mg total) onto the skin daily.    Dispense:  28 patch    Refill:  1  . busPIRone (BUSPAR) 15 MG tablet    Sig: Take 1 tablet (15 mg total) by mouth daily.    Dispense:  30 tablet     Refill:  2  . cyclobenzaprine (FLEXERIL) 10 MG tablet    Sig: TAKE 1 TABLET BY MOUTH 2 TIMES DAILY AS NEEDED FOR MUSCLE SPASMS.    Dispense:  60 tablet    Refill:  3  . DULoxetine (CYMBALTA) 60 MG capsule    Sig: Take 1 capsule (60 mg total) by mouth daily.    Dispense:  30 capsule    Refill:  3    Discontinue Prozac  . cloNIDine (CATAPRES) 0.2 MG tablet    Sig: Take 1 tablet (0.2 mg total) by mouth at bedtime.    Dispense:  30 tablet    Refill:  2  . Fluticasone-Salmeterol (ADVAIR DISKUS) 500-50 MCG/DOSE AEPB    Sig: Inhale 1 puff into the lungs 2 (two) times daily.    Dispense:  180 each    Refill:  3  . acetaminophen-codeine (TYLENOL #3) 300-30 MG tablet    Sig: Take 1 tablet by mouth every 12 (twelve) hours as needed for moderate pain.    Dispense:  60 tablet    Refill:  2  . albuterol (VENTOLIN HFA) 108 (90 Base) MCG/ACT inhaler    Sig: Inhale 2 puffs into the lungs every 6 (six) hours as needed for wheezing or shortness of breath.    Dispense:  54 g    Refill:  3  . traZODone (DESYREL) 50 MG tablet    Sig: Take 1 tablet (50 mg total) by mouth at bedtime as needed. for sleep    Dispense:  30 tablet    Refill:  2  . gabapentin (NEURONTIN) 300 MG capsule    Sig: Take 2 capsules (600 mg total) by mouth 2 (two) times daily.    Dispense:  120 capsule    Refill:  3  . meloxicam (MOBIC) 7.5 MG tablet    Sig: Take 1 tablet (7.5 mg total) by mouth daily.    Dispense:  30 tablet    Refill:  3    Follow-up: Return in about 3 months (around 06/11/2019) for medical conditions.       Charlott Rakes, MD, FAAFP. [facility] [place], [place]   03/11/2019, 9:37 AM

## 2019-03-16 ENCOUNTER — Other Ambulatory Visit: Payer: Self-pay | Admitting: Family Medicine

## 2019-03-16 DIAGNOSIS — M5010 Cervical disc disorder with radiculopathy, unspecified cervical region: Secondary | ICD-10-CM

## 2019-03-17 LAB — DRUG SCREEN 12+ALCOHOL+CRT, UR
Amphetamines, Urine: NEGATIVE ng/mL
BENZODIAZ UR QL: NEGATIVE ng/mL
Barbiturate: NEGATIVE ng/mL
Cannabinoids: NEGATIVE ng/mL
Cocaine (Metabolite): NEGATIVE ng/mL
Creatinine, Urine: 49.3 mg/dL (ref 20.0–300.0)
Ethanol, Urine: NEGATIVE %
Meperidine: NEGATIVE ng/mL
Methadone: NEGATIVE ng/mL
OPIATE SCREEN URINE: POSITIVE ng/mL — AB
Oxycodone/Oxymorphone, Urine: NEGATIVE ng/mL
Phencyclidine: NEGATIVE ng/mL
Propoxyphene: NEGATIVE ng/mL
Tramadol: NEGATIVE ng/mL

## 2019-03-19 ENCOUNTER — Telehealth: Payer: Self-pay

## 2019-03-19 NOTE — Telephone Encounter (Signed)
Patient was called and a voicemail was left informing patient to return phone call for lab results. 

## 2019-03-19 NOTE — Telephone Encounter (Signed)
-----   Message from Charlott Rakes, MD sent at 03/19/2019  2:42 PM EDT ----- Urine drug screen is in keeping with prescribed medication

## 2019-03-23 LAB — TRAMADOL (GC/MS), URINE: Tramadol: NEGATIVE

## 2019-03-30 MED FILL — ?BUSPIRONE HCL 15 MG TABLET: 15 | 30 days supply | Qty: 30 | Fill #2

## 2019-03-30 MED FILL — cloNIDine HCL 0.2 MG TABS: 0.2 | 30 days supply | Qty: 30 | Fill #0

## 2019-03-30 MED FILL — ?FUROSEMIDE 20 MG TABLET: 20 | 30 days supply | Qty: 30 | Fill #2

## 2019-03-30 MED FILL — ?LIDOCAINE 5% PATCH: 5 | 30 days supply | Qty: 30 | Fill #0

## 2019-03-30 MED FILL — ?DULoxetine HCL 60 MG CPEP: 60 | 30 days supply | Qty: 30 | Fill #0

## 2019-03-30 MED FILL — ACETAMINOPHEN/COD #3 TABLET: 300-30 | 30 days supply | Qty: 60 | Fill #0

## 2019-03-30 MED FILL — $ADVAIR 500/50MCG INHALER: 500-50 | 60 days supply | Qty: 120 | Fill #0

## 2019-03-30 MED FILL — traZODone HCL 50 MG TABS: 50 | 30 days supply | Qty: 30 | Fill #0

## 2019-03-30 MED FILL — MELOXICAM 7.5 MG TABLET: 7.5 | 30 days supply | Qty: 30 | Fill #1

## 2019-04-06 ENCOUNTER — Ambulatory Visit: Payer: Self-pay | Admitting: Family Medicine

## 2019-04-10 ENCOUNTER — Ambulatory Visit: Payer: Self-pay

## 2019-04-10 ENCOUNTER — Telehealth: Payer: Self-pay | Admitting: Family Medicine

## 2019-04-10 NOTE — Telephone Encounter (Signed)
I try to call back the Pt, again the the new number she give, no answer, she need to reschedule an appt with financial since her application was submitted late and also need to get the taxes for 2019 or the non filling letter for 2019

## 2019-04-10 NOTE — Telephone Encounter (Signed)
Pt would like to be called back, she has questions regarding 2019 transcripts; pt confirmed that [phone] is a good number to be called back

## 2019-04-13 ENCOUNTER — Telehealth: Payer: Self-pay | Admitting: Family Medicine

## 2019-04-13 NOTE — Telephone Encounter (Signed)
I try to call Pt to her number on the application and the epic number , she did not answer or have a VM, so am sending back the application and documentation she provide since she submitted late for her appt.

## 2019-05-05 ENCOUNTER — Other Ambulatory Visit: Payer: Self-pay | Admitting: Family Medicine

## 2019-05-05 DIAGNOSIS — R600 Localized edema: Secondary | ICD-10-CM

## 2019-05-05 MED FILL — MELOXICAM 7.5 MG TABLET: 7.5 | 30 days supply | Qty: 30 | Fill #2

## 2019-05-05 MED FILL — traZODone HCL 50 MG TABS: 50 | 30 days supply | Qty: 30 | Fill #1

## 2019-05-05 MED FILL — cloNIDine HCL 0.2 MG TABS: 0.2 | 30 days supply | Qty: 30 | Fill #1

## 2019-05-05 MED FILL — ACETAMINOPHEN/COD #3 TABLET: 300-30 | 30 days supply | Qty: 60 | Fill #1

## 2019-05-05 MED FILL — ?BUSPIRONE HCL 15 MG TABLET: 15 | 30 days supply | Qty: 30 | Fill #0

## 2019-05-05 MED FILL — ?FUROSEMIDE 20MG TABLET: 20 | 30 days supply | Qty: 30 | Fill #0

## 2019-05-05 MED FILL — ?DULoxetine HCL 60 MG CPEP: 60 | 30 days supply | Qty: 30 | Fill #1

## 2019-05-06 MED FILL — LIDOCAINE 5 % PTCH: 5 | 30 days supply | Qty: 30 | Fill #1

## 2019-06-01 MED FILL — $VENTOLIN HFA 18G INHALER: 108 (90 BAS | 25 days supply | Qty: 18 | Fill #2

## 2019-06-01 MED FILL — $ADVAIR 500/50MCG INHALER: 500-50 | 30 days supply | Qty: 60 | Fill #1

## 2019-06-02 MED FILL — ACETAMINOPHEN/COD #3 TABLET: 300-30 | 30 days supply | Qty: 60 | Fill #2

## 2019-06-02 MED FILL — ?DULoxetine HCL 60 MG CPEP: 60 | 30 days supply | Qty: 30 | Fill #2

## 2019-06-02 MED FILL — traZODone HCL 50 MG TABS: 50 | 30 days supply | Qty: 30 | Fill #2

## 2019-06-02 MED FILL — MELOXICAM 7.5 MG TABLET: 7.5 | 30 days supply | Qty: 30 | Fill #3

## 2019-06-02 MED FILL — ?BUSPIRONE HCL 15 MG TABLET: 15 | 30 days supply | Qty: 30 | Fill #1

## 2019-06-02 MED FILL — ?FUROSEMIDE 20MG TABLET: 20 | 30 days supply | Qty: 30 | Fill #1

## 2019-06-02 MED FILL — cloNIDine HCL 0.2 MG TABS: 0.2 | 30 days supply | Qty: 30 | Fill #2

## 2019-06-17 ENCOUNTER — Encounter: Payer: Self-pay | Admitting: Family Medicine

## 2019-06-17 ENCOUNTER — Ambulatory Visit: Payer: Self-pay | Attending: Family Medicine | Admitting: Family Medicine

## 2019-06-17 ENCOUNTER — Ambulatory Visit (HOSPITAL_BASED_OUTPATIENT_CLINIC_OR_DEPARTMENT_OTHER): Payer: Self-pay | Admitting: Licensed Clinical Social Worker

## 2019-06-17 ENCOUNTER — Other Ambulatory Visit: Payer: Self-pay

## 2019-06-17 VITALS — BP 120/81 | HR 92 | Temp 98.2°F | Ht 61.0 in | Wt 219.0 lb

## 2019-06-17 DIAGNOSIS — G4709 Other insomnia: Secondary | ICD-10-CM

## 2019-06-17 DIAGNOSIS — K0889 Other specified disorders of teeth and supporting structures: Secondary | ICD-10-CM

## 2019-06-17 DIAGNOSIS — F32A Depression, unspecified: Secondary | ICD-10-CM

## 2019-06-17 DIAGNOSIS — F332 Major depressive disorder, recurrent severe without psychotic features: Secondary | ICD-10-CM

## 2019-06-17 DIAGNOSIS — M48062 Spinal stenosis, lumbar region with neurogenic claudication: Secondary | ICD-10-CM

## 2019-06-17 DIAGNOSIS — R232 Flushing: Secondary | ICD-10-CM

## 2019-06-17 DIAGNOSIS — Z599 Problem related to housing and economic circumstances, unspecified: Secondary | ICD-10-CM

## 2019-06-17 DIAGNOSIS — Z5900 Homelessness unspecified: Secondary | ICD-10-CM

## 2019-06-17 DIAGNOSIS — Z598 Other problems related to housing and economic circumstances: Secondary | ICD-10-CM

## 2019-06-17 DIAGNOSIS — Z720 Tobacco use: Secondary | ICD-10-CM

## 2019-06-17 DIAGNOSIS — M25561 Pain in right knee: Secondary | ICD-10-CM

## 2019-06-17 DIAGNOSIS — G8929 Other chronic pain: Secondary | ICD-10-CM

## 2019-06-17 DIAGNOSIS — M5010 Cervical disc disorder with radiculopathy, unspecified cervical region: Secondary | ICD-10-CM

## 2019-06-17 DIAGNOSIS — F329 Major depressive disorder, single episode, unspecified: Secondary | ICD-10-CM

## 2019-06-17 DIAGNOSIS — R600 Localized edema: Secondary | ICD-10-CM

## 2019-06-17 DIAGNOSIS — K5909 Other constipation: Secondary | ICD-10-CM

## 2019-06-17 DIAGNOSIS — J4530 Mild persistent asthma, uncomplicated: Secondary | ICD-10-CM

## 2019-06-17 MED ORDER — CLONIDINE HCL 0.2 MG PO TABS: 0.2000 mg | ORAL_TABLET | Freq: Every day | ORAL | 2 refills | Status: DC

## 2019-06-17 MED ORDER — MELOXICAM 7.5 MG PO TABS: 7.5000 mg | ORAL_TABLET | Freq: Every day | ORAL | 3 refills | Status: DC

## 2019-06-17 MED ORDER — CLINDAMYCIN HCL 300 MG PO CAPS: 300.0000 mg | ORAL_CAPSULE | Freq: Three times a day (TID) | ORAL | 0 refills | Status: DC

## 2019-06-17 MED ORDER — NICOTINE 7 MG/24HR TD PT24: 7.0000 mg | MEDICATED_PATCH | Freq: Every day | TRANSDERMAL | 1 refills | Status: DC

## 2019-06-17 MED ORDER — TRAZODONE HCL 50 MG PO TABS: 50.0000 mg | ORAL_TABLET | Freq: Every evening | ORAL | 2 refills | Status: DC | PRN

## 2019-06-17 MED ORDER — FUROSEMIDE 20 MG PO TABS
20.0000 mg | ORAL_TABLET | Freq: Every day | ORAL | 2 refills | Status: DC | PRN
Start: 2019-06-17 — End: 2020-03-14

## 2019-06-17 MED ORDER — CYCLOBENZAPRINE HCL 10 MG PO TABS: ORAL_TABLET | ORAL | 3 refills | Status: DC

## 2019-06-17 MED ORDER — GABAPENTIN 300 MG PO CAPS: 600.0000 mg | ORAL_CAPSULE | Freq: Two times a day (BID) | ORAL | 3 refills | Status: DC

## 2019-06-17 MED ORDER — LACTULOSE 10 GM/15ML PO SOLN: 10.0000 g | Freq: Two times a day (BID) | ORAL | 1 refills | Status: DC | PRN

## 2019-06-17 MED ORDER — FLUTICASONE-SALMETEROL 500-50 MCG/DOSE IN AEPB: 1.0000 | INHALATION_SPRAY | Freq: Two times a day (BID) | RESPIRATORY_TRACT | 3 refills | Status: DC

## 2019-06-17 MED ORDER — DULOXETINE HCL 60 MG PO CPEP: 60.0000 mg | ORAL_CAPSULE | Freq: Every day | ORAL | 3 refills | Status: DC

## 2019-06-17 MED ORDER — BUSPIRONE HCL 15 MG PO TABS: 15.0000 mg | ORAL_TABLET | Freq: Every day | ORAL | 2 refills | Status: DC

## 2019-06-17 MED FILL — cloNIDine HCL 0.2 MG TABS: 0.2 | 30 days supply | Qty: 30 | Fill #0

## 2019-06-17 MED FILL — CYCLOBENZAPRINE 10 MG TAB: 10 | 30 days supply | Qty: 60 | Fill #0

## 2019-06-17 MED FILL — GABAPENTIN 300 MG CAPSULE: 300 | 30 days supply | Qty: 120 | Fill #0

## 2019-06-17 MED FILL — LACTULOSE 10 GM/15 ML SOLN: 10 | 31 days supply | Qty: 946 | Fill #0

## 2019-06-17 MED FILL — ?DULOXETINE HCL 60 MG CPEP: 60 | 30 days supply | Qty: 30 | Fill #0

## 2019-06-17 MED FILL — ?BUSPIRONE HCL 15 MG TABLET: 15 | 30 days supply | Qty: 30 | Fill #0

## 2019-06-17 MED FILL — MELOXICAM 7.5 MG TABLET: 7.5 | 30 days supply | Qty: 30 | Fill #0

## 2019-06-17 MED FILL — $ADVAIR 500/50MCG INHALER: 500-50 | 30 days supply | Qty: 60 | Fill #0

## 2019-06-17 MED FILL — ?traZODONE HCL 50MG TAB: 50 | 30 days supply | Qty: 30 | Fill #0

## 2019-06-17 MED FILL — NICOTINE 7 MG/24HR PATCH: 7 | 28 days supply | Qty: 28 | Fill #0

## 2019-06-17 MED FILL — ?FUROSEMIDE 20MG TABLET: 20 | 30 days supply | Qty: 30 | Fill #0

## 2019-06-17 MED FILL — CLINDAMYCIN HCL 300 MG CAPS: 300 | 10 days supply | Qty: 30 | Fill #0

## 2019-06-17 NOTE — Progress Notes (Signed)
Subjective:  Patient ID: [name], female    DOB: 1971-01-16  Age: 48 y.o. MRN: [id number]  CC: Hypertension   HPI [name]  is a 48 year old female with a history of asthma, hypertension, depression, chronic low back pain secondary to spinal stenosis, degenerative disc disease of the thoracic spine, cervical stenosis, hot flashes, chronic constipation who presents today for a follow-up visit.  She complains of posterior R knee pain present for 2-3 months which feels like "a tightness or pulling" with associated swelling which is worse when she rises from a seated position and described as moderate. No preceding history of trauma.  Her back pain is rated as 5/10, last Tylenol #3 dose was this morning and she denies substance abuse.Pain is worse when she does not administer her analgesics. Denies recent falls, loss of sphincteric functions. Currently on an SSRI for anxiety and depression which has currently worsened due to psychosocial circumstances. The housing complex she currently lives in will be closing down and she just discovered her fiance has been using drugs. Her asthma is controlled with no flares, insomnia is controlled and constipation is better on her laxative.  She would like treatment for a tooth ache as she is yet to see a dentist Past Medical History:  Diagnosis Date  . Allergy    pollen, pcn  . Asthma   . Depression   . Fibroids   . History of ovarian cyst   . Medical history non-contributory    sickle cell  . Menometrorrhagia   . 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 Sickle cell trait [facility])     Past Surgical History:  Procedure Laterality Date  . CESAREAN SECTION     all three pregnancies    Family History  Problem Relation Age of Onset  . Cancer Mother        colon cancer  . Hypertension Mother   . Heart disease Father   . Cancer Father        cancer  . Other Neg Hx     Allergies  Allergen Reactions  . Bupropion Hcl Er (Sr)  Hives and Other (See Comments)    The patient has an entire page of reactions that can be caused by the medication, but none that she has suffered.  . Fruit & Vegetable Daily [Nutritional Supplements] Other (See Comments)    "all fruits causes bumps and weird feeling in her mouth"  . Penicillins Hives  . Pollen Extract-Tree Extract Other (See Comments)    Allergies  . Other Rash    "bugs" roaches    Outpatient Medications Prior to Visit  Medication Sig Dispense Refill  . acetaminophen-codeine (TYLENOL #3) 300-30 MG tablet Take 1 tablet by mouth every 12 (twelve) hours as needed for moderate pain. 60 tablet 2  . albuterol (VENTOLIN HFA) 108 (90 Base) MCG/ACT inhaler Inhale 2 puffs into the lungs every 6 (six) hours as needed for wheezing or shortness of breath. 54 g 3  . cariprazine (VRAYLAR) capsule Take by mouth daily.    . clotrimazole (LOTRIMIN) 1 % cream Apply 1 application topically 2 (two) times daily. 45 g 1  . levocetirizine (XYZAL) 5 MG tablet Take 1 tablet (5 mg total) by mouth every evening. 30 tablet 2  . lidocaine (LIDODERM) 5 % Place 1 patch onto the skin daily. Remove & Discard patch within 12 hours or as directed by MD 30 patch 3  . busPIRone (BUSPAR) 15  MG tablet Take 1 tablet (15 mg total) by mouth daily. 30 tablet 2  . clindamycin (CLEOCIN) 300 MG capsule Take 1 capsule (300 mg total) by mouth 3 (three) times daily. 30 capsule 0  . cloNIDine (CATAPRES) 0.2 MG tablet Take 1 tablet (0.2 mg total) by mouth at bedtime. 30 tablet 2  . cyclobenzaprine (FLEXERIL) 10 MG tablet TAKE 1 TABLET BY MOUTH 2 TIMES DAILY AS NEEDED FOR MUSCLE SPASMS. 60 tablet 3  . DULoxetine (CYMBALTA) 60 MG capsule Take 1 capsule (60 mg total) by mouth daily. 30 capsule 3  . Fluticasone-Salmeterol (ADVAIR DISKUS) 500-50 MCG/DOSE AEPB Inhale 1 puff into the lungs 2 (two) times daily. 180 each 3  . furosemide (LASIX) 20 MG tablet TAKE 1 TABLET BY MOUTH DAILY AS NEEDED FOR PEDAL EDEMA 30 tablet 2  .  gabapentin (NEURONTIN) 300 MG capsule Take 2 capsules (600 mg total) by mouth 2 (two) times daily. 120 capsule 3  . lactulose (CHRONULAC) 10 GM/15ML solution Take 15 mLs (10 g total) by mouth 2 (two) times daily as needed for mild constipation. 946 mL 1  . meloxicam (MOBIC) 7.5 MG tablet Take 1 tablet (7.5 mg total) by mouth daily. 30 tablet 3  . traZODone (DESYREL) 50 MG tablet Take 1 tablet (50 mg total) by mouth at bedtime as needed. for sleep 30 tablet 2  . nicotine (NICODERM CQ) 7 mg/24hr patch Place 1 patch (7 mg total) onto the skin daily. (Patient not taking: Reported on 06/17/2019) 28 patch 1  . nystatin (MYCOSTATIN) 100000 UNIT/ML suspension Take 5 mLs (500,000 Units total) by mouth 4 (four) times daily. (Patient not taking: Reported on 06/17/2019) 120 mL 0  . triamcinolone cream (KENALOG) 0.1 % APPLY TOPICALLY 2 (TWO) TIMES DAILY. (Patient not taking: Reported on 06/17/2019) 30 g 1   No facility-administered medications prior to visit.      ROS Review of Systems  Constitutional: Negative for activity change, appetite change and fatigue.  HENT: Negative for congestion, sinus pressure and sore throat.   Eyes: Negative for visual disturbance.  Respiratory: Negative for cough, chest tightness, shortness of breath and wheezing.   Cardiovascular: Negative for chest pain and palpitations.  Gastrointestinal: Negative for abdominal distention, abdominal pain and constipation.  Endocrine: Negative for polydipsia.  Genitourinary: Negative for dysuria and frequency.  Musculoskeletal:       See HPI  Skin: Negative for rash.  Neurological: Negative for tremors, light-headedness and numbness.  Hematological: Does not bruise/bleed easily.  Psychiatric/Behavioral: Positive for dysphoric mood. Negative for agitation and behavioral problems.    Objective:  BP 120/81   Pulse 92   Temp 98.2 F (36.8 C) (Oral)   Ht 5\' 1"  (1.549 m)   Wt 219 lb (99.3 kg)   SpO2 96%   BMI 41.38 kg/m    BP/Weight 06/17/2019 03/11/2019 [id number]  Systolic BP 123456 [id number]  Diastolic BP 81 82 71  Wt. (Lbs) 219 224.8 -  BMI 41.38 42.48 41.19  Some encounter information is confidential and restricted. Go to Review Flowsheets activity to see all data.      Physical Exam Constitutional:      Appearance: She is well-developed.  Neck:     Vascular: No JVD.  Cardiovascular:     Rate and Rhythm: Normal rate.     Heart sounds: Normal heart sounds. No murmur.  Pulmonary:     Effort: Pulmonary effort is normal.     Breath sounds: Normal breath sounds. No wheezing or rales.  Chest:     Chest wall: No tenderness.  Abdominal:     General: Bowel sounds are normal. There is no distension.     Palpations: Abdomen is soft. There is no mass.     Tenderness: There is no abdominal tenderness.  Musculoskeletal: Normal range of motion.     Right lower leg: No edema.     Left lower leg: No edema.     Comments: Slight lumbar spine TTP, negative straight leg raise b/l  Neurological:     Mental Status: She is alert and oriented to person, place, and time.  Psychiatric:     Comments: Dysphoric mood     CMP Latest Ref Rng & Units 01/02/2019 12/12/2017 06/12/2017  Glucose 65 - 99 mg/dL 99 78 87  BUN 6 - 24 mg/dL 7 6 5(L)  Creatinine 0.57 - 1.00 mg/dL 0.89 0.98 0.92  Sodium 134 - 144 mmol/L 144 142 139  Potassium 3.5 - 5.2 mmol/L 4.2 3.7 3.7  Chloride 96 - 106 mmol/L 104 102 103  CO2 20 - 29 mmol/L 24 27 24   Calcium 8.7 - 10.2 mg/dL 8.8 9.2 8.8  Total Protein 6.0 - 8.5 g/dL 6.1 6.8 6.7  Total Bilirubin 0.0 - 1.2 mg/dL <0.2 0.5 0.2  Alkaline Phos 39 - 117 IU/L 109 91 73  AST 0 - 40 IU/L 15 21 20   ALT 0 - 32 IU/L 11 16 10     Lipid Panel     Component Value Date/Time   CHOL 159 01/02/2019 0925   TRIG 137 01/02/2019 0925   HDL 42 01/02/2019 0925   CHOLHDL 3.8 01/02/2019 0925   CHOLHDL 3.0 05/15/2016 0949   VLDL 13 05/15/2016 0949   LDLCALC 90 01/02/2019 0925    CBC    Component Value  Date/Time   WBC 7.2 04/18/2015 1136   RBC 4.44 04/18/2015 1136   HGB 12.6 08/02/2016 1806   HCT 37.0 08/02/2016 1806   PLT 314 04/18/2015 1136   MCV 78.6 04/18/2015 1136   MCH 27.7 04/18/2015 1136   MCHC 35.2 04/18/2015 1136   RDW 13.4 04/18/2015 1136   LYMPHSABS 1.6 06/26/2010 2057   MONOABS 0.4 06/26/2010 2057   EOSABS 0.1 06/26/2010 2057   BASOSABS 0.0 06/26/2010 2057    Lab Results  Component Value Date   HGBA1C 5.3 (A) 03/11/2019    Assessment & Plan:   1. Anxiety and depression Stable She does have underlying social economic stressors - busPIRone (BUSPAR) 15 MG tablet; Take 1 tablet (15 mg total) by mouth daily.  Dispense: 30 tablet; Refill: 2 - DULoxetine (CYMBALTA) 60 MG capsule; Take 1 capsule (60 mg total) by mouth daily.  Dispense: 30 capsule; Refill: 3  2. Tooth ache Unable to see the dentist despite previous referral - clindamycin (CLEOCIN) 300 MG capsule; Take 1 capsule (300 mg total) by mouth 3 (three) times daily.  Dispense: 30 capsule; Refill: 0  3. Hot flashes Stable - cloNIDine (CATAPRES) 0.2 MG tablet; Take 1 tablet (0.2 mg total) by mouth at bedtime.  Dispense: 30 tablet; Refill: 2  4. Cervical disc disorder with radiculopathy of cervical region Stable Currently on a pain contract PDMP reviewed - no evidence of aberrant behavior - Drug Screen 12+Alcohol+CRT, Ur - cyclobenzaprine (FLEXERIL) 10 MG tablet; TAKE 1 TABLET BY MOUTH 2 TIMES DAILY AS NEEDED FOR MUSCLE SPASMS.  Dispense: 60 tablet; Refill: 3  5. Spinal stenosis, lumbar region, with neurogenic claudication See #4 above - Drug Screen 12+Alcohol+CRT, Ur - cyclobenzaprine (  FLEXERIL) 10 MG tablet; TAKE 1 TABLET BY MOUTH 2 TIMES DAILY AS NEEDED FOR MUSCLE SPASMS.  Dispense: 60 tablet; Refill: 3 - gabapentin (NEURONTIN) 300 MG capsule; Take 2 capsules (600 mg total) by mouth 2 (two) times daily.  Dispense: 120 capsule; Refill: 3 - meloxicam (MOBIC) 7.5 MG tablet; Take 1 tablet (7.5 mg total) by  mouth daily.  Dispense: 30 tablet; Refill: 3  6. Mild persistent asthma without complication Controlled No acute exacerbations - Basic Metabolic Panel - Fluticasone-Salmeterol (ADVAIR DISKUS) 500-50 MCG/DOSE AEPB; Inhale 1 puff into the lungs 2 (two) times daily.  Dispense: 180 each; Refill: 3  7. Pedal edema stable - furosemide (LASIX) 20 MG tablet; Take 1 tablet (20 mg total) by mouth daily as needed.  Dispense: 30 tablet; Refill: 2  8. Other constipation Increase fiber intake - lactulose (CHRONULAC) 10 GM/15ML solution; Take 15 mLs (10 g total) by mouth 2 (two) times daily as needed for mild constipation.  Dispense: 946 mL; Refill: 1  9. Other insomnia Controlled - traZODone (DESYREL) 50 MG tablet; Take 1 tablet (50 mg total) by mouth at bedtime as needed. for sleep  Dispense: 30 tablet; Refill: 2  10. Chronic pain of right knee She does have popliteal fossa pain for which I have advised her to continue meloxicam, knee brace If symptoms persist will send for ultrasound to evaluate for Baker's cyst   Meds ordered this encounter  Medications  . busPIRone (BUSPAR) 15 MG tablet    Sig: Take 1 tablet (15 mg total) by mouth daily.    Dispense:  30 tablet    Refill:  2  . clindamycin (CLEOCIN) 300 MG capsule    Sig: Take 1 capsule (300 mg total) by mouth 3 (three) times daily.    Dispense:  30 capsule    Refill:  0  . cloNIDine (CATAPRES) 0.2 MG tablet    Sig: Take 1 tablet (0.2 mg total) by mouth at bedtime.    Dispense:  30 tablet    Refill:  2  . cyclobenzaprine (FLEXERIL) 10 MG tablet    Sig: TAKE 1 TABLET BY MOUTH 2 TIMES DAILY AS NEEDED FOR MUSCLE SPASMS.    Dispense:  60 tablet    Refill:  3  . DULoxetine (CYMBALTA) 60 MG capsule    Sig: Take 1 capsule (60 mg total) by mouth daily.    Dispense:  30 capsule    Refill:  3    Discontinue Prozac  . Fluticasone-Salmeterol (ADVAIR DISKUS) 500-50 MCG/DOSE AEPB    Sig: Inhale 1 puff into the lungs 2 (two) times daily.     Dispense:  180 each    Refill:  3  . furosemide (LASIX) 20 MG tablet    Sig: Take 1 tablet (20 mg total) by mouth daily as needed.    Dispense:  30 tablet    Refill:  2  . gabapentin (NEURONTIN) 300 MG capsule    Sig: Take 2 capsules (600 mg total) by mouth 2 (two) times daily.    Dispense:  120 capsule    Refill:  3  . lactulose (CHRONULAC) 10 GM/15ML solution    Sig: Take 15 mLs (10 g total) by mouth 2 (two) times daily as needed for mild constipation.    Dispense:  946 mL    Refill:  1  . meloxicam (MOBIC) 7.5 MG tablet    Sig: Take 1 tablet (7.5 mg total) by mouth daily.    Dispense:  30 tablet  Refill:  3  . traZODone (DESYREL) 50 MG tablet    Sig: Take 1 tablet (50 mg total) by mouth at bedtime as needed. for sleep    Dispense:  30 tablet    Refill:  2    Follow-up: Return in about 3 months (around 09/16/2019) for chrnic medical conditions - virtual.       Charlott Rakes, MD, FAAFP. [facility] [place], [place]   06/17/2019, 11:53 AM

## 2019-06-17 NOTE — Progress Notes (Signed)
Patient states that right legs is swollen

## 2019-06-17 NOTE — Patient Instructions (Signed)

## 2019-06-18 LAB — BASIC METABOLIC PANEL
BUN/Creatinine Ratio: 6 — ABNORMAL LOW (ref 9–23)
BUN: 4 mg/dL — ABNORMAL LOW (ref 6–24)
CO2: 29 mmol/L (ref 20–29)
Calcium: 9.3 mg/dL (ref 8.7–10.2)
Chloride: 102 mmol/L (ref 96–106)
Creatinine, Ser: 0.72 mg/dL (ref 0.57–1.00)
GFR calc Af Amer: 114 mL/min/{1.73_m2} (ref >59)
GFR calc non Af Amer: 99 mL/min/{1.73_m2} (ref >59)
Glucose: 84 mg/dL (ref 65–99)
Potassium: 3.5 mmol/L (ref 3.5–5.2)
Sodium: 146 mmol/L — ABNORMAL HIGH (ref 134–144)

## 2019-06-19 NOTE — BH Specialist Note (Signed)
Integrated Behavioral Health Initial Visit  MRN: [id number] Name: [name]  Number of [place] Clinician visits:: 1/6 Session Start time: 11:52 AM  Session End time: 12:05 PM Total time: 13 minutes  Type of Service: [place] Interpretor:No. Interpretor Name and Language: NA   Warm Hand Off Completed.       SUBJECTIVE: [name] is a 48 y.o. female accompanied by self Patient was referred by Dr. Margarita Rana for depression and anxiety. Patient reports the following symptoms/concerns: Pt reports increase in anxiety and depression triggered by partner's ongoing substance use and financial strain. Pt shared that she and partner will be evicted from residence by the end of this year due to back rent Duration of problem: Ongoing; Severity of problem: severe  OBJECTIVE: Mood: Anxious and Depressed and Affect: Depressed Risk of harm to self or others: No plan to harm self or others  LIFE CONTEXT: Family and Social: Pt reports receiving emotional support from adult daughter School/Work: Pt was recently denied Medicaid (March 2020) due to insufficient information. Pt plans on obtaining private lawyer to assist with the appeal.  Self-Care: Pt's limited income has prevented ability to afford medications to manage physical conditions. She shared that she prays and plays games on phone to cope with ongoing stressors Life Changes: Pt reports increase stress due to financial strain, eviction, and relationship with partner  GOALS ADDRESSED: Patient will: 1. Reduce symptoms of: anxiety, depression and stress 2. Increase knowledge and/or ability of: self-management skills  3. Demonstrate ability to: Increase adequate support systems for patient/family and Increase motivation to adhere to plan of care  INTERVENTIONS: Interventions utilized: Supportive Counseling and Link to [employer]  Standardized Assessments completed: GAD-7  and PHQ 2&9  ASSESSMENT: Patient currently experiencing depression and anxiety triggered by partner's ongoing substance use and financial strain. Pt shared that she and partner will be evicted from residence by the end of this year due to back rent. Pt has been unable to comply with medications management due to inability to afford meds.   Patient may benefit from psychotherapy and renewal of blue card. LCSW discussed importance of self-care. Pt verbalized understanding. RMA was able to obtain refills of pt's medication for free of charge. Pt voiced appreciation and met with Financial Counselor to discuss how she can renew blue card to remain compliant. Self-care strategies discussed, in addition, to community resources to assist with obtaining housing  PLAN: 1. Follow up with behavioral health clinician on : Schedule follow up appointment with LCSW 2. Behavioral recommendations: Utilize strategies and resources provided. Renew blue card to assist with medication compliance 3. Referral(s): [place] (In Clinic) 4. "From scale of 1-10, how likely are you to follow plan?":   Rebekah Chesterfield, LCSW 06/19/2019 10:15 AM

## 2019-06-21 LAB — DRUG SCREEN 12+ALCOHOL+CRT, UR
Amphetamines, Urine: NEGATIVE ng/mL
BENZODIAZ UR QL: NEGATIVE ng/mL
Barbiturate: NEGATIVE ng/mL
Cannabinoids: NEGATIVE ng/mL
Cocaine (Metabolite): NEGATIVE ng/mL
Creatinine, Urine: 31.7 mg/dL (ref 20.0–300.0)
Ethanol, Urine: NEGATIVE %
Meperidine: NEGATIVE ng/mL
Methadone: NEGATIVE ng/mL
OPIATE SCREEN URINE: POSITIVE ng/mL — AB
Oxycodone/Oxymorphone, Urine: NEGATIVE ng/mL
Phencyclidine: NEGATIVE ng/mL
Propoxyphene: NEGATIVE ng/mL
Tramadol: NEGATIVE ng/mL

## 2019-06-25 ENCOUNTER — Telehealth: Payer: Self-pay

## 2019-06-25 NOTE — Telephone Encounter (Signed)
Patient was called and there is no voicemail set up to leave a voicemail.

## 2019-06-25 NOTE — Telephone Encounter (Signed)
-----   Message from Charlott Rakes, MD sent at 06/22/2019  1:38 PM EDT ----- Please inform the patient that labs are normal. Thank you.

## 2019-06-30 MED FILL — ACETAMINOPHEN/COD #3 TABLET: 300-30 | 30 days supply | Qty: 60 | Fill #2

## 2019-07-29 ENCOUNTER — Other Ambulatory Visit: Payer: Self-pay | Admitting: Family Medicine

## 2019-07-29 DIAGNOSIS — M48062 Spinal stenosis, lumbar region with neurogenic claudication: Secondary | ICD-10-CM

## 2019-07-29 MED FILL — ?BUSPIRONE HCL 15 MG TABLET: 15 | 30 days supply | Qty: 30 | Fill #1

## 2019-07-29 MED FILL — $VENTOLIN HFA 18G INHALER: 108 (90 BAS | 25 days supply | Qty: 18 | Fill #2

## 2019-07-29 MED FILL — $ADVAIR 500/50MCG INHALER: 500-50 | 90 days supply | Qty: 180 | Fill #1

## 2019-07-29 MED FILL — ?DULOXETINE HCL 60 MG CPEP: 60 | 30 days supply | Qty: 30 | Fill #1

## 2019-07-29 MED FILL — MELOXICAM 7.5 MG TABLET: 7.5 | 30 days supply | Qty: 30 | Fill #1

## 2019-07-29 MED FILL — ?FUROSEMIDE 20MG TABLET: 20 | 30 days supply | Qty: 30 | Fill #1

## 2019-07-31 ENCOUNTER — Other Ambulatory Visit: Payer: Self-pay | Admitting: Family Medicine

## 2019-07-31 DIAGNOSIS — M48062 Spinal stenosis, lumbar region with neurogenic claudication: Secondary | ICD-10-CM

## 2019-07-31 MED FILL — LIDOCAINE 5 % PTCH: 5 | 30 days supply | Qty: 30 | Fill #1

## 2019-07-31 MED FILL — ACETAMINOPHEN/COD #3 TABLET: 300-30 | 30 days supply | Qty: 60 | Fill #0

## 2019-08-06 ENCOUNTER — Emergency Department (HOSPITAL_COMMUNITY)
Admission: EM | Admit: 2019-08-06 | Discharge: 2019-08-06 | Disposition: A | Discharge status: Home / Self Care | Payer: Self-pay | Attending: Emergency Medicine | Admitting: Emergency Medicine

## 2019-08-06 ENCOUNTER — Other Ambulatory Visit: Payer: Self-pay

## 2019-08-06 ENCOUNTER — Encounter (HOSPITAL_COMMUNITY): Payer: Self-pay | Admitting: Emergency Medicine

## 2019-08-06 DIAGNOSIS — Z79899 Other long term (current) drug therapy: Secondary | ICD-10-CM | POA: Insufficient documentation

## 2019-08-06 DIAGNOSIS — F17210 Nicotine dependence, cigarettes, uncomplicated: Secondary | ICD-10-CM | POA: Insufficient documentation

## 2019-08-06 DIAGNOSIS — G8929 Other chronic pain: Secondary | ICD-10-CM | POA: Insufficient documentation

## 2019-08-06 DIAGNOSIS — M25561 Pain in right knee: Secondary | ICD-10-CM | POA: Insufficient documentation

## 2019-08-06 DIAGNOSIS — J45909 Unspecified asthma, uncomplicated: Secondary | ICD-10-CM | POA: Insufficient documentation

## 2019-08-06 MED ORDER — IBUPROFEN 600 MG PO TABS: 600.0000 mg | ORAL_TABLET | Freq: Four times a day (QID) | ORAL | 0 refills | Status: DC | PRN

## 2019-08-06 MED ORDER — CYCLOBENZAPRINE HCL 10 MG PO TABS: 10.0000 mg | ORAL_TABLET | Freq: Two times a day (BID) | ORAL | 0 refills | Status: DC | PRN

## 2019-08-06 MED FILL — CYCLOBENZAPRINE 10 MG TAB: 10 | 10 days supply | Qty: 20 | Fill #0

## 2019-08-06 MED FILL — IBUPROFEN 600 MG TABLET: 600 | 7 days supply | Qty: 30 | Fill #0

## 2019-08-06 NOTE — ED Provider Notes (Signed)
[facility] EMERGENCY DEPARTMENT Provider Note   CSN: [id number] Arrival date & time: 08/06/19  1148     History   Chief Complaint Chief Complaint  Patient presents with  . Leg Pain    HPI TZIRY DITORO is a 48 y.o. female.     The history is provided by the patient. No language interpreter was used.  Leg Pain    48 year old female with hx of recurrent back pain and recurrent bilateral knee pain presenting with complaints of right knee pain.  Patient report for the past 2 days she has had intermittent pain about her right knee.  Pain involves both the anterior and posterior aspects of the knee, worsening with standing and with walking, described as a sharp, radiates up and down the leg without any known injury.  Pain is moderate in severity not adequately managed with taking Tylenol 3 and lidocaine patch at home.  She reported having a previous prior MRI several years ago showing degenerative changes to her knee.  She denies any recent injury, denies any fever, back pain, numbness.  Endorsed mild weakness when the pain is intense.  No prior history of PE or DVT.    Past Medical History:  Diagnosis Date  . Allergy    pollen, pcn  . Asthma   . Depression   . Fibroids   . History of ovarian cyst   . Medical history non-contributory    sickle cell  . Menometrorrhagia   . 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 Sickle cell trait [facility])     Patient Active Problem List   Diagnosis Date Noted  . Tobacco abuse 09/15/2018  . Visual disturbance 04/24/2018  . Obesity 12/12/2017  . Insomnia 03/13/2017  . Anxiety associated with depression 01/09/2017  . Tooth ache 05/24/2016  . Spinal stenosis, lumbar region, with neurogenic claudication 05/15/2016  . Cervical disc disorder with radiculopathy of cervical region 05/15/2016  . Vaginal atrophy 04/30/2016  . Asthma 02/20/2016  . Depression 02/20/2016  . Myopathy 02/20/2016  . Back pain 02/20/2016  .  Neuropathy 02/20/2016  . Fibroids   . History of ovarian cyst   . Menometrorrhagia 04/09/2011    Past Surgical History:  Procedure Laterality Date  . CESAREAN SECTION     all three pregnancies     OB History    Gravida  3   Para  3   Term  0   Preterm      AB      Living  3     SAB      TAB      Ectopic      Multiple      Live Births               Home Medications    Prior to Admission medications   Medication Sig Start Date End Date Taking? Authorizing Provider  acetaminophen-codeine (TYLENOL #3) 300-30 MG tablet TAKE 1 TABLET BY MOUTH EVERY 12 (TWELVE) HOURS AS NEEDED FOR MODERATE PAIN. 07/31/19   Charlott Rakes, MD  albuterol (VENTOLIN HFA) 108 (90 Base) MCG/ACT inhaler Inhale 2 puffs into the lungs every 6 (six) hours as needed for wheezing or shortness of breath. 03/11/19   Charlott Rakes, MD  busPIRone (BUSPAR) 15 MG tablet Take 1 tablet (15 mg total) by mouth daily. 06/17/19   Charlott Rakes, MD  cariprazine (VRAYLAR) capsule Take by mouth daily.    [provider]  clindamycin (CLEOCIN) 300 MG capsule Take 1 capsule (300 mg total) by mouth 3 (three) times daily. 06/17/19   Charlott Rakes, MD  cloNIDine (CATAPRES) 0.2 MG tablet Take 1 tablet (0.2 mg total) by mouth at bedtime. 06/17/19   Charlott Rakes, MD  clotrimazole (LOTRIMIN) 1 % cream Apply 1 application topically 2 (two) times daily. 09/13/17   Charlott Rakes, MD  cyclobenzaprine (FLEXERIL) 10 MG tablet TAKE 1 TABLET BY MOUTH 2 TIMES DAILY AS NEEDED FOR MUSCLE SPASMS. 06/17/19   Charlott Rakes, MD  DULoxetine (CYMBALTA) 60 MG capsule Take 1 capsule (60 mg total) by mouth daily. 06/17/19   Charlott Rakes, MD  Fluticasone-Salmeterol (ADVAIR DISKUS) 500-50 MCG/DOSE AEPB Inhale 1 puff into the lungs 2 (two) times daily. 06/17/19   Charlott Rakes, MD  furosemide (LASIX) 20 MG tablet Take 1 tablet (20 mg total) by mouth daily as needed. 06/17/19   Charlott Rakes, MD  gabapentin (NEURONTIN)  300 MG capsule Take 2 capsules (600 mg total) by mouth 2 (two) times daily. 06/17/19   Charlott Rakes, MD  lactulose (CHRONULAC) 10 GM/15ML solution Take 15 mLs (10 g total) by mouth 2 (two) times daily as needed for mild constipation. 06/17/19   Charlott Rakes, MD  levocetirizine (XYZAL) 5 MG tablet Take 1 tablet (5 mg total) by mouth every evening. 12/31/18   Charlott Rakes, MD  lidocaine (LIDODERM) 5 % Place 1 patch onto the skin daily. Remove & Discard patch within 12 hours or as directed by MD 12/31/18   Charlott Rakes, MD  meloxicam (MOBIC) 7.5 MG tablet Take 1 tablet (7.5 mg total) by mouth daily. 06/17/19   Charlott Rakes, MD  nicotine (NICODERM CQ) 7 mg/24hr patch Place 1 patch (7 mg total) onto the skin daily. 06/17/19   Charlott Rakes, MD  nystatin (MYCOSTATIN) 100000 UNIT/ML suspension Take 5 mLs (500,000 Units total) by mouth 4 (four) times daily. Patient not taking: Reported on 06/17/2019 10/21/18   Kerin Perna, NP  traZODone (DESYREL) 50 MG tablet Take 1 tablet (50 mg total) by mouth at bedtime as needed. for sleep 06/17/19   Charlott Rakes, MD  triamcinolone cream (KENALOG) 0.1 % APPLY TOPICALLY 2 (TWO) TIMES DAILY. Patient not taking: Reported on 06/17/2019 06/03/18   Charlott Rakes, MD    Family History Family History  Problem Relation Age of Onset  . Cancer Mother        colon cancer  . Hypertension Mother   . Heart disease Father   . Cancer Father        cancer  . Other Neg Hx     Social History Social History   Tobacco Use  . Smoking status: Current Every Day Smoker    Packs/day: 0.25    Types: Cigarettes    Last attempt to quit: 02/14/2016    Years since quitting: 3.4  . Smokeless tobacco: Never Used  . Tobacco comment: 1 cig daily 09/13/17  Substance Use Topics  . Alcohol use: No  . Drug use: No     Allergies   Bupropion hcl er (sr), Fruit & vegetable daily [nutritional supplements], Penicillins, Pollen extract-tree extract, and Other   Review  of Systems Review of Systems  All other systems reviewed and are negative.    Physical Exam Updated Vital Signs BP 121/84 (BP Location: Right Arm)   Pulse 74   Temp 98.8 F (37.1 C) (Oral)   Resp 16   SpO2 100%   Physical Exam Vitals signs and nursing note reviewed.  Constitutional:      General: She is not in acute distress.    Appearance: She is well-developed. She is obese.  HENT:     Head: Atraumatic.  Eyes:     Conjunctiva/sclera: Conjunctivae normal.  Neck:     Musculoskeletal: Neck supple.  Musculoskeletal:        General: Tenderness (Right knee: Patella is located, tenderness noted to medial lateral joint as well as inferior patella region on palpation without any crepitus, swelling, warmth, or erythema.  Pain with knee flexion but no decreased range of motion.  No joint laxity. ) present.     Comments: Right hip and right ankle nontender.  No tenderness to low back.  Leg compartment is soft with intact pedal pulses.  Skin:    Findings: No rash.  Neurological:     Mental Status: She is alert.      ED Treatments / Results  Labs (all labs ordered are listed, but only abnormal results are displayed) Labs Reviewed - No data to display  EKG None  Radiology No results found.  Procedures Procedures (including critical care time)  Medications Ordered in ED Medications - No data to display   Initial Impression / Assessment and Plan / ED Course  I have reviewed the triage vital signs and the nursing notes.  Pertinent labs & imaging results that were available during my care of the patient were reviewed by me and considered in my medical decision making (see chart for details).        BP 121/84 (BP Location: Right Arm)   Pulse 74   Temp 98.8 F (37.1 C) (Oral)   Resp 16   SpO2 100%    Final Clinical Impressions(s) / ED Diagnoses   Final diagnoses:  Chronic pain of right knee    ED Discharge Orders         Ordered    ibuprofen (ADVIL) 600 MG  tablet  Every 6 hours PRN     08/06/19 1348    cyclobenzaprine (FLEXERIL) 10 MG tablet  2 times daily PRN     08/06/19 1348         1:14 PM Patient here with acute on chronic right knee pain.  No recent injury.  Examination revealed no evidence to suggest infection or acute fracture or dislocation.  Low suspicion for DVT.  Likely musculoskeletal pain.  No radicular pain to suggest sciatica.  Will provide rice therapy and will give orthopedic referral given.   Domenic Moras, PA-C 08/06/19 [place], Wenda Overland, MD 08/06/19 [phone]

## 2019-08-06 NOTE — Discharge Instructions (Signed)
Wear knee sleeve for support.  Take medications prescribed and follow up with orthopedist for further care.

## 2019-08-06 NOTE — ED Triage Notes (Signed)
Left leg pain from knee to foot mostly around knee, no injury she  States. Since yesterday

## 2019-08-25 ENCOUNTER — Ambulatory Visit: Payer: Self-pay | Attending: Family Medicine | Admitting: Family Medicine

## 2019-08-25 ENCOUNTER — Other Ambulatory Visit: Payer: Self-pay

## 2019-08-25 ENCOUNTER — Encounter: Payer: Self-pay | Admitting: Family Medicine

## 2019-08-25 DIAGNOSIS — F419 Anxiety disorder, unspecified: Secondary | ICD-10-CM

## 2019-08-25 DIAGNOSIS — R232 Flushing: Secondary | ICD-10-CM

## 2019-08-25 DIAGNOSIS — G8929 Other chronic pain: Secondary | ICD-10-CM

## 2019-08-25 DIAGNOSIS — M48062 Spinal stenosis, lumbar region with neurogenic claudication: Secondary | ICD-10-CM

## 2019-08-25 DIAGNOSIS — F329 Major depressive disorder, single episode, unspecified: Secondary | ICD-10-CM

## 2019-08-25 DIAGNOSIS — K0889 Other specified disorders of teeth and supporting structures: Secondary | ICD-10-CM

## 2019-08-25 DIAGNOSIS — G4709 Other insomnia: Secondary | ICD-10-CM

## 2019-08-25 DIAGNOSIS — M25561 Pain in right knee: Secondary | ICD-10-CM

## 2019-08-25 MED ORDER — TRAZODONE HCL 50 MG PO TABS: 50.0000 mg | ORAL_TABLET | Freq: Every evening | ORAL | 2 refills | Status: DC | PRN

## 2019-08-25 MED ORDER — DULOXETINE HCL 60 MG PO CPEP: 60.0000 mg | ORAL_CAPSULE | Freq: Every day | ORAL | 3 refills | Status: DC

## 2019-08-25 MED ORDER — MELOXICAM 7.5 MG PO TABS: 7.5000 mg | ORAL_TABLET | Freq: Every day | ORAL | 3 refills | Status: DC

## 2019-08-25 MED ORDER — GABAPENTIN 300 MG PO CAPS: 600.0000 mg | ORAL_CAPSULE | Freq: Two times a day (BID) | ORAL | 3 refills | Status: DC

## 2019-08-25 MED ORDER — BUSPIRONE HCL 15 MG PO TABS: 15.0000 mg | ORAL_TABLET | Freq: Every day | ORAL | 2 refills | Status: DC

## 2019-08-25 MED ORDER — CLONIDINE HCL 0.2 MG PO TABS: 0.2000 mg | ORAL_TABLET | Freq: Every day | ORAL | 2 refills | Status: DC

## 2019-08-25 MED ORDER — CLINDAMYCIN HCL 300 MG PO CAPS: 300.0000 mg | ORAL_CAPSULE | Freq: Three times a day (TID) | ORAL | 0 refills | Status: DC

## 2019-08-25 MED FILL — DULoxetine HCL 60 MG CPEP: 60 | 30 days supply | Qty: 30 | Fill #0

## 2019-08-25 MED FILL — traZODone HCL 50 MG TABS: 50 | 30 days supply | Qty: 30 | Fill #0

## 2019-08-25 MED FILL — MELOXICAM 7.5 MG TABLET: 7.5 | 30 days supply | Qty: 30 | Fill #0

## 2019-08-25 MED FILL — GABAPENTIN 300 MG CAPSULE: 300 | 30 days supply | Qty: 120 | Fill #0

## 2019-08-25 MED FILL — CLINDAMYCIN HCL 300 MG CAPS: 300 | 10 days supply | Qty: 30 | Fill #0

## 2019-08-25 MED FILL — busPIRone HCL 15 MG TABS: 15 | 30 days supply | Qty: 30 | Fill #0

## 2019-08-25 MED FILL — cloNIDine HCL 0.2 MG TABS: 0.2 | 30 days supply | Qty: 30 | Fill #0

## 2019-08-25 NOTE — Progress Notes (Signed)
Patient has been called and DOB has been verified. Patient has been screened and transferred to PCP to start phone visit.  Patient is having pain in her right legs that radiates down to her feet.

## 2019-08-25 NOTE — Progress Notes (Signed)
Virtual Visit via Telephone Note  I connected with [name], on 08/25/2019 at 1:31 PM by telephone due to the COVID-19 pandemic and verified that I am speaking with the correct person using two identifiers.   Consent: I discussed the limitations, risks, security and privacy concerns of performing an evaluation and management service by telephone and the availability of in person appointments. I also discussed with the patient that there may be a patient responsible charge related to this service. The patient expressed understanding and agreed to proceed.   Location of Patient: Home  Location of Provider: Clinic   Persons participating in Telemedicine visit: Rosealee Timisha Duffany Farrington-CMA Dr. Margarita Rana     History of Present Illness: [name] is a 48 year old female with a history of asthma, hypertension, depression, chronic low back pain secondary to spinal stenosis, degenerative disc disease of the thoracic spine, cervical stenosis, hot flashes, chronic constipation who presents today for a follow-up visit.   She complains of pain in her right leg radiating to her feet; this started last Monday and feels like a stabbing pain.  On further questioning she admits pain is in her right knee and goes on to say pain is 8/10 with associated swelling in R knee.  Seen at the ED 3 weeks ago for this where she was prescribed Flexeril and ibuprofen.  At her visit with me 3 months ago she had complained of right popliteal fossa pain.  Ibuprofen provides minimal relief.  Today she has no back pain she says and last Tylenol #3 dose was 2 days ago; uses it as needed.  She denies recent falls. She is yet to apply for the [facility] Financial discount despite persuading her to do so on multiple occassions to facilitate referral if needed.  Her asthma is stable, depression is controlled and she is doing well on her antihypertensive. Also complains of a tooth ache and has been unable to see a  dentist due to lack of medical coverage.  Past Medical History:  Diagnosis Date  . Allergy    pollen, pcn  . Asthma   . Depression   . Fibroids   . History of ovarian cyst   . Medical history non-contributory    sickle cell  . Menometrorrhagia   . 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 Sickle cell trait (HCC)    Allergies  Allergen Reactions  . Bupropion Hcl Er (Sr) Hives and Other (See Comments)    The patient has an entire page of reactions that can be caused by the medication, but none that she has suffered.  . Fruit & Vegetable Daily [Nutritional Supplements] Other (See Comments)    "all fruits causes bumps and weird feeling in her mouth"  . Penicillins Hives  . Pollen Extract-Tree Extract Other (See Comments)    Allergies  . Other Rash    "bugs" roaches    Current Outpatient Medications on File Prior to Visit  Medication Sig Dispense Refill  . acetaminophen-codeine (TYLENOL #3) 300-30 MG tablet TAKE 1 TABLET BY MOUTH EVERY 12 (TWELVE) HOURS AS NEEDED FOR MODERATE PAIN. 60 tablet 1  . albuterol (VENTOLIN HFA) 108 (90 Base) MCG/ACT inhaler Inhale 2 puffs into the lungs every 6 (six) hours as needed for wheezing or shortness of breath. 54 g 3  . busPIRone (BUSPAR) 15 MG tablet Take 1 tablet (15 mg total) by mouth daily. 30 tablet 2  . cariprazine (VRAYLAR) capsule Take by mouth daily.    Marland Kitchen  clindamycin (CLEOCIN) 300 MG capsule Take 1 capsule (300 mg total) by mouth 3 (three) times daily. 30 capsule 0  . cloNIDine (CATAPRES) 0.2 MG tablet Take 1 tablet (0.2 mg total) by mouth at bedtime. 30 tablet 2  . clotrimazole (LOTRIMIN) 1 % cream Apply 1 application topically 2 (two) times daily. 45 g 1  . cyclobenzaprine (FLEXERIL) 10 MG tablet Take 1 tablet (10 mg total) by mouth 2 (two) times daily as needed for muscle spasms. 20 tablet 0  . DULoxetine (CYMBALTA) 60 MG capsule Take 1 capsule (60 mg total) by mouth daily. 30 capsule 3  . Fluticasone-Salmeterol (ADVAIR DISKUS)  500-50 MCG/DOSE AEPB Inhale 1 puff into the lungs 2 (two) times daily. 180 each 3  . furosemide (LASIX) 20 MG tablet Take 1 tablet (20 mg total) by mouth daily as needed. 30 tablet 2  . gabapentin (NEURONTIN) 300 MG capsule Take 2 capsules (600 mg total) by mouth 2 (two) times daily. 120 capsule 3  . ibuprofen (ADVIL) 600 MG tablet Take 1 tablet (600 mg total) by mouth every 6 (six) hours as needed. 30 tablet 0  . lactulose (CHRONULAC) 10 GM/15ML solution Take 15 mLs (10 g total) by mouth 2 (two) times daily as needed for mild constipation. 946 mL 1  . levocetirizine (XYZAL) 5 MG tablet Take 1 tablet (5 mg total) by mouth every evening. 30 tablet 2  . lidocaine (LIDODERM) 5 % Place 1 patch onto the skin daily. Remove & Discard patch within 12 hours or as directed by MD 30 patch 3  . meloxicam (MOBIC) 7.5 MG tablet Take 1 tablet (7.5 mg total) by mouth daily. 30 tablet 3  . traZODone (DESYREL) 50 MG tablet Take 1 tablet (50 mg total) by mouth at bedtime as needed. for sleep 30 tablet 2  . nicotine (NICODERM CQ) 7 mg/24hr patch Place 1 patch (7 mg total) onto the skin daily. (Patient not taking: Reported on 08/25/2019) 28 patch 1  . [DISCONTINUED] nystatin (MYCOSTATIN) 100000 UNIT/ML suspension Take 5 mLs (500,000 Units total) by mouth 4 (four) times daily. (Patient not taking: Reported on 06/17/2019) 120 mL 0   No current facility-administered medications on file prior to visit.     Observations/Objective: Alert, awake, oriented x3 Not in acute distress  Assessment and Plan: 1. Anxiety and depression Stable - busPIRone (BUSPAR) 15 MG tablet; Take 1 tablet (15 mg total) by mouth daily.  Dispense: 30 tablet; Refill: 2 - DULoxetine (CYMBALTA) 60 MG capsule; Take 1 capsule (60 mg total) by mouth daily.  Dispense: 30 capsule; Refill: 3  2. Hot flashes Controlled - cloNIDine (CATAPRES) 0.2 MG tablet; Take 1 tablet (0.2 mg total) by mouth at bedtime.  Dispense: 30 tablet; Refill: 2  3. Spinal  stenosis, lumbar region, with neurogenic claudication Stable - gabapentin (NEURONTIN) 300 MG capsule; Take 2 capsules (600 mg total) by mouth 2 (two) times daily.  Dispense: 120 capsule; Refill: 3 - meloxicam (MOBIC) 7.5 MG tablet; Take 1 tablet (7.5 mg total) by mouth daily.  Dispense: 30 tablet; Refill: 3  4. Other insomnia Stable - traZODone (DESYREL) 50 MG tablet; Take 1 tablet (50 mg total) by mouth at bedtime as needed. for sleep  Dispense: 30 tablet; Refill: 2  5. Chronic pain of right knee Uncontrolled She will benefit from weight loss Would also like to refer her for PT but she has no medical coverage at this time and is yet to apply for the [facility] financial discount Continue ibuprofen, Flexeril  6. Tooth ache She will need to see a dentist - clindamycin (CLEOCIN) 300 MG capsule; Take 1 capsule (300 mg total) by mouth 3 (three) times daily.  Dispense: 30 capsule; Refill: 0   Follow Up Instructions: 3 months   I discussed the assessment and treatment plan with the patient. The patient was provided an opportunity to ask questions and all were answered. The patient agreed with the plan and demonstrated an understanding of the instructions.   The patient was advised to call back or seek an in-person evaluation if the symptoms worsen or if the condition fails to improve as anticipated.     I provided 25 minutes total of non-face-to-face time during this encounter including median intraservice time, reviewing previous notes, labs, imaging, medications, management and patient verbalized understanding.     Charlott Rakes, MD, FAAFP. [facility] and [place], [place]   08/25/2019, 1:31 PM

## 2019-08-28 MED FILL — ACETAMINOPHEN/COD #3 TABLET: 300-30 | 30 days supply | Qty: 60 | Fill #1

## 2019-09-30 ENCOUNTER — Other Ambulatory Visit: Payer: Self-pay | Admitting: Family Medicine

## 2019-09-30 DIAGNOSIS — M48062 Spinal stenosis, lumbar region with neurogenic claudication: Secondary | ICD-10-CM

## 2019-09-30 MED FILL — MELOXICAM 7.5 MG TABLET: 7.5 | 30 days supply | Qty: 30 | Fill #2

## 2019-09-30 MED FILL — busPIRone HCL 15 MG TABS: 15 | 30 days supply | Qty: 30 | Fill #2

## 2019-09-30 MED FILL — DULoxetine HCL 60 MG CPEP: 60 | 30 days supply | Qty: 30 | Fill #2

## 2019-09-30 MED FILL — $VENTOLIN HFA 18G INHALER: 108 (90 BAS | 25 days supply | Qty: 18 | Fill #3

## 2019-09-30 MED FILL — FUROSEMIDE 20 MG TABS: 20 | 30 days supply | Qty: 30 | Fill #2

## 2019-10-01 MED FILL — cloNIDine HCL 0.2 MG TABS: 0.2 | 30 days supply | Qty: 30 | Fill #1

## 2019-10-08 ENCOUNTER — Other Ambulatory Visit: Payer: Self-pay | Admitting: Family Medicine

## 2019-10-08 DIAGNOSIS — M48062 Spinal stenosis, lumbar region with neurogenic claudication: Secondary | ICD-10-CM

## 2019-10-08 MED FILL — cloNIDine HCL 0.2 MG TABS: 0.2 | 30 days supply | Qty: 30 | Fill #0

## 2019-10-08 MED FILL — DULoxetine HCL 60 MG CPEP: 60 | 30 days supply | Qty: 30 | Fill #0

## 2019-10-08 MED FILL — CLINDAMYCIN HCL 300 MG CAPS: 300 | 10 days supply | Qty: 30 | Fill #0

## 2019-10-08 MED FILL — MELOXICAM 7.5 MG TABLET: 7.5 | 30 days supply | Qty: 30 | Fill #0

## 2019-10-08 MED FILL — busPIRone HCL 15 MG TABS: 15 | 30 days supply | Qty: 30 | Fill #0

## 2019-10-08 MED FILL — ?FUROSEMIDE 20 MG TABLET: 20 | 30 days supply | Qty: 30 | Fill #2

## 2019-10-13 MED FILL — ACETAMINOPHEN/COD #3 TABLET: 300-30 | 30 days supply | Qty: 60 | Fill #0

## 2019-12-08 MED FILL — ?TRAZODONE HCL 50 TABS: 50 | 30 days supply | Qty: 30 | Fill #0

## 2019-12-08 MED FILL — LIDOCAINE 5 % PTCH: 5 | 30 days supply | Qty: 30 | Fill #2

## 2019-12-08 MED FILL — ACETAMINOPHEN/COD #3 TABLET: 300-30 | 30 days supply | Qty: 60 | Fill #1

## 2019-12-08 MED FILL — ?CLONIDINE HCL 0.2 MG TABS: 0.2 | 30 days supply | Qty: 30 | Fill #1

## 2019-12-08 MED FILL — $VENTOLIN HFA 18G INHALER: 108 (90 BAS | 25 days supply | Qty: 18 | Fill #3

## 2019-12-08 MED FILL — !ADVAIR 500/50 DISKUS: 500-50 | 30 days supply | Qty: 60 | Fill #2

## 2019-12-09 ENCOUNTER — Other Ambulatory Visit: Payer: Self-pay | Admitting: Family Medicine

## 2019-12-09 ENCOUNTER — Ambulatory Visit: Payer: Self-pay | Attending: Family Medicine | Admitting: Family Medicine

## 2019-12-09 ENCOUNTER — Encounter: Payer: Self-pay | Admitting: Family Medicine

## 2019-12-09 ENCOUNTER — Other Ambulatory Visit: Payer: Self-pay

## 2019-12-09 DIAGNOSIS — Z720 Tobacco use: Secondary | ICD-10-CM

## 2019-12-09 DIAGNOSIS — R232 Flushing: Secondary | ICD-10-CM

## 2019-12-09 DIAGNOSIS — F419 Anxiety disorder, unspecified: Secondary | ICD-10-CM

## 2019-12-09 DIAGNOSIS — G4709 Other insomnia: Secondary | ICD-10-CM

## 2019-12-09 DIAGNOSIS — M48062 Spinal stenosis, lumbar region with neurogenic claudication: Secondary | ICD-10-CM

## 2019-12-09 DIAGNOSIS — F329 Major depressive disorder, single episode, unspecified: Secondary | ICD-10-CM

## 2019-12-09 MED ORDER — MELOXICAM 7.5 MG PO TABS: 7.5000 mg | ORAL_TABLET | Freq: Every day | ORAL | 3 refills | Status: DC

## 2019-12-09 MED ORDER — CLONIDINE HCL 0.2 MG PO TABS: 0.2000 mg | ORAL_TABLET | Freq: Every day | ORAL | 2 refills | Status: DC

## 2019-12-09 MED ORDER — NICOTINE 7 MG/24HR TD PT24: 7.0000 mg | MEDICATED_PATCH | Freq: Every day | TRANSDERMAL | 1 refills | Status: DC

## 2019-12-09 MED ORDER — GABAPENTIN 300 MG PO CAPS: 600.0000 mg | ORAL_CAPSULE | Freq: Two times a day (BID) | ORAL | 3 refills | Status: DC

## 2019-12-09 MED ORDER — DULOXETINE HCL 60 MG PO CPEP: 60.0000 mg | ORAL_CAPSULE | Freq: Every day | ORAL | 3 refills | Status: DC

## 2019-12-09 MED ORDER — ACETAMINOPHEN-CODEINE #3 300-30 MG PO TABS: 1.0000 | ORAL_TABLET | Freq: Two times a day (BID) | ORAL | 1 refills | Status: DC | PRN

## 2019-12-09 MED ORDER — TRAZODONE HCL 50 MG PO TABS: 50.0000 mg | ORAL_TABLET | Freq: Every evening | ORAL | 2 refills | Status: DC | PRN

## 2019-12-09 MED FILL — DULoxetine HCL 60 MG CPEP: 60 | 30 days supply | Qty: 30 | Fill #0

## 2019-12-09 MED FILL — NICOTINE 7 MG/24HR PATCH: 7 | 28 days supply | Qty: 28 | Fill #0

## 2019-12-09 MED FILL — ?TRAZODONE HCL 50 TABS: 50 | 30 days supply | Qty: 30 | Fill #0

## 2019-12-09 MED FILL — MELOXICAM 7.5 MG TABLET: 7.5 | 30 days supply | Qty: 30 | Fill #0

## 2019-12-09 MED FILL — GABAPENTIN 300 MG CAPSULE: 300 | 30 days supply | Qty: 120 | Fill #0

## 2019-12-09 NOTE — Progress Notes (Signed)
Established Patient Office Visit  Subjective:  Patient ID: [name], female    DOB: 08/19/1971  Age: 49 y.o. MRN: [id number]  CC:  Chief Complaint  Patient presents with  . Pain    HPI [name] is a 49 year old female with a history of asthma, hypertension, depression, chronic low back pain secondary to spinal stenosis, degenerative disc disease of the thoracic spine, cervical stenosis, hot flashes, and chronic constipation who presents today for a follow-up visit.  Her back pain is managed on Tylenol #3 and she states that she is not in pain unless while just sitting.  Last dose of Tylenol #3 was this morning and she takes it as needed. The back pain is described as a sharp, shooting pain that moves down her legs. Denies recent falls, loss of sphincteric functions.  Her back and knee pain are aggravated by movement, especially if she is sedentary for a long period.  The bilateral knee pain is described as "tightness" and is 8/10 at it's worst. She ambulates with a cane. Her depression is worse lately due to leaving her fiance because of his drug use.  She is currently on an SSRI and is not currently seeing a therapist or psychiatrist.  She applied for financial assistance but has not yet received it.  Her asthma is controlled with no flares and she rarely has to use her rescue inhaler.  Her constipation is improved with lactulose as needed.  Her blood pressure is well controlled.  Denies dizziness, fainting, SOB, and chest pain.  No acute complaints today.  Past Medical History:  Diagnosis Date  . Allergy    pollen, pcn  . Asthma   . Depression   . Fibroids   . History of ovarian cyst   . Medical history non-contributory    sickle cell  . Menometrorrhagia   . 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 Sickle cell trait [facility])     Past Surgical History:  Procedure Laterality Date  . CESAREAN SECTION     all three pregnancies    Family History  Problem  Relation Age of Onset  . Cancer Mother        colon cancer  . Hypertension Mother   . Heart disease Father   . Cancer Father        cancer  . Other Neg Hx     Social History   Socioeconomic History  . Marital status: Divorced    Spouse name: Not on file  . Number of children: Not on file  . Years of education: Not on file  . Highest education level: Not on file  Occupational History  . Not on file  Tobacco Use  . Smoking status: Current Every Day Smoker    Packs/day: 0.25    Types: Cigarettes    Last attempt to quit: 02/14/2016    Years since quitting: 3.8  . Smokeless tobacco: Never Used  . Tobacco comment: 1 cig daily 09/13/17  Substance and Sexual Activity  . Alcohol use: No  . Drug use: No  . Sexual activity: Yes    Partners: Male    Birth control/protection: I.U.D., None  Other Topics Concern  . Not on file  Social History Narrative  . Not on file   Social Determinants of Health   Financial Resource Strain:   . Difficulty of Paying Living Expenses:   Food Insecurity:   . Worried About Charity fundraiser in  the Last Year:   . [place] in the Last Year:   Transportation Needs:   . Film/video editor (Medical):   Marland Kitchen Lack of Transportation (Non-Medical):   Physical Activity:   . Days of Exercise per Week:   . Minutes of Exercise per Session:   Stress:   . Feeling of Stress :   Social Connections:   . Frequency of Communication with Friends and Family:   . Frequency of Social Gatherings with Friends and Family:   . Attends Religious Services:   . Active Member of Clubs or Organizations:   . Attends Archivist Meetings:   Marland Kitchen Marital Status:   Intimate Partner Violence:   . Fear of Current or Ex-Partner:   . Emotionally Abused:   Marland Kitchen Physically Abused:   . Sexually Abused:     Outpatient Medications Prior to Visit  Medication Sig Dispense Refill  . albuterol (VENTOLIN HFA) 108 (90 Base) MCG/ACT inhaler Inhale 2 puffs into the lungs  every 6 (six) hours as needed for wheezing or shortness of breath. 54 g 3  . busPIRone (BUSPAR) 15 MG tablet Take 1 tablet (15 mg total) by mouth daily. 30 tablet 2  . cariprazine (VRAYLAR) capsule Take by mouth daily.    . clindamycin (CLEOCIN) 300 MG capsule Take 1 capsule (300 mg total) by mouth 3 (three) times daily. 30 capsule 0  . clotrimazole (LOTRIMIN) 1 % cream Apply 1 application topically 2 (two) times daily. 45 g 1  . cyclobenzaprine (FLEXERIL) 10 MG tablet Take 1 tablet (10 mg total) by mouth 2 (two) times daily as needed for muscle spasms. 20 tablet 0  . Fluticasone-Salmeterol (ADVAIR DISKUS) 500-50 MCG/DOSE AEPB Inhale 1 puff into the lungs 2 (two) times daily. 180 each 3  . furosemide (LASIX) 20 MG tablet Take 1 tablet (20 mg total) by mouth daily as needed. 30 tablet 2  . ibuprofen (ADVIL) 600 MG tablet Take 1 tablet (600 mg total) by mouth every 6 (six) hours as needed. 30 tablet 0  . lactulose (CHRONULAC) 10 GM/15ML solution Take 15 mLs (10 g total) by mouth 2 (two) times daily as needed for mild constipation. 946 mL 1  . levocetirizine (XYZAL) 5 MG tablet Take 1 tablet (5 mg total) by mouth every evening. 30 tablet 2  . lidocaine (LIDODERM) 5 % Place 1 patch onto the skin daily. Remove & Discard patch within 12 hours or as directed by MD 30 patch 3  . acetaminophen-codeine (TYLENOL #3) 300-30 MG tablet TAKE 1 TABLET BY MOUTH EVERY 12 (TWELVE) HOURS AS NEEDED FOR MODERATE PAIN. 60 tablet 1  . cloNIDine (CATAPRES) 0.2 MG tablet Take 1 tablet (0.2 mg total) by mouth at bedtime. 30 tablet 2  . DULoxetine (CYMBALTA) 60 MG capsule Take 1 capsule (60 mg total) by mouth daily. 30 capsule 3  . gabapentin (NEURONTIN) 300 MG capsule Take 2 capsules (600 mg total) by mouth 2 (two) times daily. 120 capsule 3  . meloxicam (MOBIC) 7.5 MG tablet Take 1 tablet (7.5 mg total) by mouth daily. 30 tablet 3  . nicotine (NICODERM CQ) 7 mg/24hr patch Place 1 patch (7 mg total) onto the skin daily.  (Patient not taking: Reported on 08/25/2019) 28 patch 1  . traZODone (DESYREL) 50 MG tablet Take 1 tablet (50 mg total) by mouth at bedtime as needed. for sleep 30 tablet 2   No facility-administered medications prior to visit.    Allergies  Allergen Reactions  .  Bupropion Hcl Er (Sr) Hives and Other (See Comments)    The patient has an entire page of reactions that can be caused by the medication, but none that she has suffered.  . Fruit & Vegetable Daily [Nutritional Supplements] Other (See Comments)    "all fruits causes bumps and weird feeling in her mouth"  . Penicillins Hives  . Pollen Extract-Tree Extract Other (See Comments)    Allergies  . Other Rash    "bugs" roaches    ROS Review of Systems  Constitutional: Negative for fatigue, fever and unexpected weight change.  HENT: Negative for congestion, rhinorrhea, sinus pressure and sinus pain.   Eyes: Negative for visual disturbance.  Respiratory: Negative for cough, chest tightness and shortness of breath.   Cardiovascular: Negative for chest pain, palpitations and leg swelling.  Gastrointestinal: Negative for abdominal distention, abdominal pain, constipation, diarrhea, nausea and vomiting.  Endocrine: Negative for polydipsia and polyuria.  Genitourinary: Negative for decreased urine volume, difficulty urinating and dysuria.  Musculoskeletal: Positive for back pain, gait problem and myalgias.       See HPI  Skin: Negative for color change and rash.  Neurological: Negative for dizziness, tremors, weakness and numbness.  Hematological: Does not bruise/bleed easily.  Psychiatric/Behavioral: Negative for agitation and behavioral problems.      Objective:    Physical Exam  Constitutional: She is oriented to person, place, and time. She appears well-developed and well-nourished.  HENT:  Head: Normocephalic and atraumatic.  Eyes: Pupils are equal, round, and reactive to light. Conjunctivae and EOM are normal.   Cardiovascular: Normal rate, regular rhythm, normal heart sounds and intact distal pulses.  Pulmonary/Chest: Effort normal and breath sounds normal. No respiratory distress. She has no wheezes.  Abdominal: Soft. Bowel sounds are normal. She exhibits no distension. There is no abdominal tenderness.  Musculoskeletal:     Lumbar back: Pain and tenderness present. No edema.     Right hip: Decreased range of motion.     Left hip: Decreased range of motion.     Right knee: No swelling. Decreased range of motion.     Left knee: No swelling. Decreased range of motion.     Comments: Restricted flexion in bilateral knees Bilateral positive straight leg raise Tenderness to palpation across lumbar back  Neurological: She is alert and oriented to person, place, and time.  Skin: Skin is warm and dry. No rash noted.  Psychiatric: She has a normal mood and affect. Her behavior is normal.  Nursing note and vitals reviewed.   BP 103/70   Pulse 91   Ht 5\' 1"  (1.549 m)   Wt 206 lb 9.6 oz (93.7 kg)   SpO2 98%   BMI 39.04 kg/m  Wt Readings from Last 3 Encounters:  12/09/19 206 lb 9.6 oz (93.7 kg)  06/17/19 219 lb (99.3 kg)  03/11/19 224 lb 12.8 oz (102 kg)     There are no preventive care reminders to display for this patient.  There are no preventive care reminders to display for this patient.  Lab Results  Component Value Date   TSH 3.100 12/11/2016   Lab Results  Component Value Date   WBC 7.2 04/18/2015   HGB 12.6 08/02/2016   HCT 37.0 08/02/2016   MCV 78.6 04/18/2015   PLT 314 04/18/2015   Lab Results  Component Value Date   NA 146 (H) 06/17/2019   K 3.5 06/17/2019   CO2 29 06/17/2019   GLUCOSE 84 06/17/2019   BUN 4 (L)  06/17/2019   CREATININE 0.72 06/17/2019   BILITOT <0.2 01/02/2019   ALKPHOS 109 01/02/2019   AST 15 01/02/2019   ALT 11 01/02/2019   PROT 6.1 01/02/2019   ALBUMIN 3.9 01/02/2019   CALCIUM 9.3 06/17/2019   ANIONGAP 8 04/18/2015   Lab Results   Component Value Date   CHOL 159 01/02/2019   Lab Results  Component Value Date   HDL 42 01/02/2019   Lab Results  Component Value Date   LDLCALC 90 01/02/2019   Lab Results  Component Value Date   TRIG 137 01/02/2019   Lab Results  Component Value Date   CHOLHDL 3.8 01/02/2019   Lab Results  Component Value Date   HGBA1C 5.3 (A) 03/11/2019      Assessment & Plan:   1. Spinal stenosis, lumbar region, with neurogenic claudication Stable Pain is now intermittent Currently on a pain contract PDMP reviewed - no evidence of aberrant behavior Continue current regimen - Drug Screen 12+Alcohol+CRT, Ur - acetaminophen-codeine (TYLENOL #3) 300-30 MG tablet; Take 1 tablet by mouth every 12 (twelve) hours as needed for moderate pain.  Dispense: 60 tablet; Refill: 1 - gabapentin (NEURONTIN) 300 MG capsule; Take 2 capsules (600 mg total) by mouth 2 (two) times daily.  Dispense: 120 capsule; Refill: 3 - meloxicam (MOBIC) 7.5 MG tablet; Take 1 tablet (7.5 mg total) by mouth daily.  Dispense: 30 tablet; Refill: 3  2. Hot flashes Stable Continue clonidine If hypotension occurs, we may have to reduce dose - cloNIDine (CATAPRES) 0.2 MG tablet; Take 1 tablet (0.2 mg total) by mouth at bedtime.  Dispense: 30 tablet; Refill: 2  3. Anxiety and depression Stable She does have underlying social stressors which are contributing. Patient declines therapy referral today but may consider in the future. - DULoxetine (CYMBALTA) 60 MG capsule; Take 1 capsule (60 mg total) by mouth daily.  Dispense: 30 capsule; Refill: 3  4. Other insomnia Controlled - traZODone (DESYREL) 50 MG tablet; Take 1 tablet (50 mg total) by mouth at bedtime as needed. for sleep  Dispense: 30 tablet; Refill: 2 - Basic Metabolic Panel  5. Tobacco abuse Spent 3 minutes on counseling about the benefits of smoking cessation. Begin nicotine patch - nicotine (NICODERM CQ) 7 mg/24hr patch; Place 1 patch (7 mg total) onto  the skin daily.  Dispense: 28 patch; Refill: 1   Meds ordered this encounter  Medications  . acetaminophen-codeine (TYLENOL #3) 300-30 MG tablet    Sig: Take 1 tablet by mouth every 12 (twelve) hours as needed for moderate pain.    Dispense:  60 tablet    Refill:  1    Fill after 01/05/20  . cloNIDine (CATAPRES) 0.2 MG tablet    Sig: Take 1 tablet (0.2 mg total) by mouth at bedtime.    Dispense:  30 tablet    Refill:  2  . DULoxetine (CYMBALTA) 60 MG capsule    Sig: Take 1 capsule (60 mg total) by mouth daily.    Dispense:  30 capsule    Refill:  3    Discontinue Prozac  . gabapentin (NEURONTIN) 300 MG capsule    Sig: Take 2 capsules (600 mg total) by mouth 2 (two) times daily.    Dispense:  120 capsule    Refill:  3  . meloxicam (MOBIC) 7.5 MG tablet    Sig: Take 1 tablet (7.5 mg total) by mouth daily.    Dispense:  30 tablet    Refill:  3  .  traZODone (DESYREL) 50 MG tablet    Sig: Take 1 tablet (50 mg total) by mouth at bedtime as needed. for sleep    Dispense:  30 tablet    Refill:  2  . nicotine (NICODERM CQ) 7 mg/24hr patch    Sig: Place 1 patch (7 mg total) onto the skin daily.    Dispense:  28 patch    Refill:  1    Follow-up: Return in about 3 months (around 03/10/2020) for Chronic medical condition.    Tomasita Morrow, RN   Evaluation and management procedures were performed by me with DNP Student in attendance, note written by DNP student under my supervision and collaboration. I have reviewed the note and I agree with the management and plan.  Ms Gallmeyer will benefit from PT but unfortunately has no medical coverage and is yet to apply for the [facility] financial assistance so referral can be placed. She has been encouraged to do see the financial assistance department. Given she is now using Tramadol prn we will need to rea evaluate the need for bid dosing and possibly consider cutting down on the frequency of administration of her Tylenol #3  Charlott Rakes, MD,  FAAFP. [facility] [place], [place]   12/09/2019, 8:24 PM

## 2019-12-09 NOTE — Patient Instructions (Signed)

## 2019-12-10 LAB — BASIC METABOLIC PANEL
BUN/Creatinine Ratio: 9 (ref 9–23)
BUN: 9 mg/dL (ref 6–24)
CO2: 25 mmol/L (ref 20–29)
Calcium: 9.1 mg/dL (ref 8.7–10.2)
Chloride: 104 mmol/L (ref 96–106)
Creatinine, Ser: 1 mg/dL (ref 0.57–1.00)
GFR calc Af Amer: 76 mL/min/{1.73_m2} (ref >59)
GFR calc non Af Amer: 66 mL/min/{1.73_m2} (ref >59)
Glucose: 117 mg/dL — ABNORMAL HIGH (ref 65–99)
Potassium: 3.7 mmol/L (ref 3.5–5.2)
Sodium: 143 mmol/L (ref 134–144)

## 2019-12-14 LAB — DRUG SCREEN 12+ALCOHOL+CRT, UR
Amphetamines, Urine: NEGATIVE ng/mL
BENZODIAZ UR QL: NEGATIVE ng/mL
Barbiturate: NEGATIVE ng/mL
Cannabinoids: NEGATIVE ng/mL
Cocaine (Metabolite): NEGATIVE ng/mL
Creatinine, Urine: 128.4 mg/dL (ref 20.0–300.0)
Ethanol, Urine: NEGATIVE %
Meperidine: NEGATIVE ng/mL
Methadone: NEGATIVE ng/mL
OPIATE SCREEN URINE: POSITIVE ng/mL — AB
Oxycodone/Oxymorphone, Urine: NEGATIVE ng/mL
Phencyclidine: NEGATIVE ng/mL
Propoxyphene: NEGATIVE ng/mL
Tramadol: NEGATIVE ng/mL

## 2019-12-23 ENCOUNTER — Telehealth: Payer: Self-pay

## 2019-12-23 NOTE — Telephone Encounter (Signed)
-----   Message from Charlott Rakes, MD sent at 12/15/2019 11:55 AM EDT ----- Urine drug screen is in keeping with Tylenol #3 which she receives

## 2019-12-23 NOTE — Telephone Encounter (Signed)
Patient was called and a voicemail was left informing patient to return phone call. 

## 2019-12-23 NOTE — Telephone Encounter (Signed)
Patient returned call for results. Patient was identified by 2 patient identifiers. Patients results were given, patient verbalized understanding and had no further questions or concerns at this time.

## 2020-03-14 ENCOUNTER — Ambulatory Visit: Payer: Self-pay | Admitting: Family Medicine

## 2020-03-14 ENCOUNTER — Other Ambulatory Visit: Payer: Self-pay | Admitting: Physician Assistant

## 2020-03-14 ENCOUNTER — Telehealth: Payer: Self-pay

## 2020-03-14 ENCOUNTER — Ambulatory Visit: Payer: Self-pay | Admitting: Physician Assistant

## 2020-03-14 ENCOUNTER — Other Ambulatory Visit: Payer: Self-pay

## 2020-03-14 VITALS — BP 132/89 | HR 91 | Temp 98.5°F | Resp 18 | Ht 61.0 in | Wt 203.0 lb

## 2020-03-14 DIAGNOSIS — R232 Flushing: Secondary | ICD-10-CM

## 2020-03-14 DIAGNOSIS — F331 Major depressive disorder, recurrent, moderate: Secondary | ICD-10-CM

## 2020-03-14 DIAGNOSIS — F419 Anxiety disorder, unspecified: Secondary | ICD-10-CM

## 2020-03-14 DIAGNOSIS — Z13228 Encounter for screening for other metabolic disorders: Secondary | ICD-10-CM

## 2020-03-14 DIAGNOSIS — Z1159 Encounter for screening for other viral diseases: Secondary | ICD-10-CM

## 2020-03-14 DIAGNOSIS — F329 Major depressive disorder, single episode, unspecified: Secondary | ICD-10-CM

## 2020-03-14 DIAGNOSIS — R600 Localized edema: Secondary | ICD-10-CM

## 2020-03-14 DIAGNOSIS — M48062 Spinal stenosis, lumbar region with neurogenic claudication: Secondary | ICD-10-CM

## 2020-03-14 DIAGNOSIS — K047 Periapical abscess without sinus: Secondary | ICD-10-CM

## 2020-03-14 DIAGNOSIS — G4709 Other insomnia: Secondary | ICD-10-CM

## 2020-03-14 MED ORDER — DULOXETINE HCL 60 MG PO CPEP: 60.0000 mg | ORAL_CAPSULE | Freq: Every day | ORAL | 3 refills | Status: DC

## 2020-03-14 MED ORDER — TRAZODONE HCL 50 MG PO TABS: 50.0000 mg | ORAL_TABLET | Freq: Every evening | ORAL | 2 refills | Status: DC | PRN

## 2020-03-14 MED ORDER — FUROSEMIDE 20 MG PO TABS: 20.0000 mg | ORAL_TABLET | Freq: Every day | ORAL | 2 refills | Status: DC | PRN

## 2020-03-14 MED ORDER — MELOXICAM 7.5 MG PO TABS: 7.5000 mg | ORAL_TABLET | Freq: Every day | ORAL | 3 refills | Status: DC

## 2020-03-14 MED ORDER — SULFAMETHOXAZOLE-TRIMETHOPRIM 800-160 MG PO TABS: 1.0000 | ORAL_TABLET | Freq: Two times a day (BID) | ORAL | 0 refills | Status: DC

## 2020-03-14 MED ORDER — CLONIDINE HCL 0.2 MG PO TABS: 0.2000 mg | ORAL_TABLET | Freq: Every day | ORAL | 2 refills | Status: DC

## 2020-03-14 MED ORDER — ACETAMINOPHEN-CODEINE #3 300-30 MG PO TABS: 1.0000 | ORAL_TABLET | Freq: Two times a day (BID) | ORAL | 1 refills | Status: DC | PRN

## 2020-03-14 MED ORDER — HYDROXYZINE HCL 10 MG PO TABS: 10.0000 mg | ORAL_TABLET | Freq: Three times a day (TID) | ORAL | 0 refills | Status: DC | PRN

## 2020-03-14 MED ORDER — GABAPENTIN 300 MG PO CAPS: 600.0000 mg | ORAL_CAPSULE | Freq: Two times a day (BID) | ORAL | 3 refills | Status: DC

## 2020-03-14 MED FILL — ?FUROSEMIDE 20MG TABLET: 20 | 30 days supply | Qty: 30 | Fill #0

## 2020-03-14 MED FILL — ?CLONIDINE HCL 0.2 MG TABS: 0.2 | 30 days supply | Qty: 30 | Fill #0

## 2020-03-14 MED FILL — hydrOXYzine HCL 10 MG TABS: 10 | 10 days supply | Qty: 30 | Fill #0

## 2020-03-14 MED FILL — ?TRAZODONE HCL 50 TABS: 50 | 30 days supply | Qty: 30 | Fill #0

## 2020-03-14 MED FILL — DULoxetine HCL 60 MG CPEP: 60 | 30 days supply | Qty: 30 | Fill #0

## 2020-03-14 MED FILL — ?SULFAMETHOXAZOLE-TMP DS TB: 800-160 | 5 days supply | Qty: 10 | Fill #0

## 2020-03-14 MED FILL — MELOXICAM 7.5 MG TABLET: 7.5 | 30 days supply | Qty: 30 | Fill #0

## 2020-03-14 MED FILL — GABAPENTIN 300 MG CAPSULE: 300 | 30 days supply | Qty: 120 | Fill #0

## 2020-03-14 NOTE — Patient Instructions (Addendum)
I encourage you to resume the Cymbalta as well as the trazodone.  I encourage you to resume the clonidine as well.  I have also refilled the Lasix, Tylenol 3, and the gabapentin.  I encourage you to try the hydroxyzine during the day to see if this helps you with anxiety.  You can use it every 6-8 hours as needed.  For your dental infection, I have prescribed Bactrim, you will take this twice a day for 5 days.  Please keep [facility] appointment [facility] to review your fasting labs  Please let us know if there is anything else we can do for you  Kennieth Rad, PA-C Physician Assistant [facility] Medicine [email or web address]    Dental Abscess  A dental abscess is a collection of pus in or around a tooth that results from an infection. An abscess can cause pain in the affected area as well as other symptoms. Treatment is important to help with symptoms and to prevent the infection from spreading. What are the causes? This condition is caused by a bacterial infection around the root of the tooth that involves the inner part of the tooth (pulp). It may result from:  Severe tooth decay.  Trauma to the tooth, such as a broken or chipped tooth, that allows bacteria to enter into the pulp.  Severe gum disease around a tooth. What increases the risk? This condition is more likely to develop in males. It is also more likely to develop in people who:  Have dental decay (cavities).  Eat sugary snacks between meals.  Use tobacco products.  Have diabetes.  Have a weakened disease-fighting system (immune system).  Do not brush and care for their teeth regularly. What are the signs or symptoms? Symptoms of this condition include:  Severe pain in and around the infected tooth.  Swelling and redness around the infected tooth, in the mouth, or in the face.  Tenderness.  Pus drainage.  Bad breath.  Bitter  taste in the mouth.  Difficulty swallowing.  Difficulty opening the mouth.  Nausea.  Vomiting.  Chills.  Swollen neck glands.  Fever. How is this diagnosed? This condition is diagnosed based on:  Your symptoms and your medical and dental history.  An examination of the infected tooth. During the exam, your dentist may tap on the infected tooth. You may also have X-rays of the affected area. How is this treated? This condition is treated by getting rid of the infection. This may be done with:  Incision and drainage. This procedure is done by making an incision in the abscess to drain out the pus. Removing pus is the first priority in treating an abscess.  Antibiotic medicines. These may be used in certain situations.  Antibacterial mouth rinse.  A root canal. This may be performed to save the tooth. Your dentist accesses the visible part of your tooth (crown) with a drill and removes any damaged pulp. Then the space is filled and sealed off.  Tooth extraction. The tooth is pulled out if it cannot be saved by other treatment. You may also receive treatment for pain, such as:  Acetaminophen or NSAIDs.  Gels that contain a numbing medicine.  An injection to block the pain near your nerve. Follow these instructions at home: Medicines  Take over-the-counter and prescription medicines only as told by your dentist.  If you were prescribed an antibiotic, take it as told by your dentist. Do not stop taking the antibiotic even  if you start to feel better.  If you were prescribed a gel that contains a numbing medicine, use it exactly as told in the directions. Do not use these gels for children who are younger than 66 years of age.  Do not drive or use heavy machinery while taking prescription pain medicine. General instructions  Rinse out your mouth often with salt water to relieve pain or swelling. To make a salt-water mixture, completely dissolve -1 tsp of salt in 1 cup of  warm water.  Eat a soft diet while your abscess is healing.  Drink enough fluid to keep your urine pale yellow.  Do not apply heat to the outside of your mouth.  Do not use any products that contain nicotine or tobacco, such as cigarettes and e-cigarettes. If you need help quitting, ask your health care provider.  Keep all follow-up visits as told by your dentist. This is important. How is this prevented?  Brush your teeth every morning and night with fluoride toothpaste. Floss one time each day.  Get regularly scheduled dental cleanings.  Consider having a dental sealant applied on teeth that have deep holes (caries).  Drink fluoridated water regularly. This includes most tap water. Check the label on bottled water to see if it contains fluoride.  Drink water instead of sugary drinks.  Eat healthy meals and snacks.  Wear a mouth guard or face shield to protect your teeth while playing sports. Contact a health care provider if:  Your pain is worse and is not helped by medicine. Get help right away if:  You have a fever or chills.  Your symptoms suddenly get worse.  You have a very bad headache.  You have problems breathing or swallowing.  You have trouble opening your mouth.  You have swelling in your neck or around your eye. Summary  A dental abscess is a collection of pus in or around a tooth that results from an infection.  A dental abscess may result from severe tooth decay, trauma to the tooth, or severe gum disease around a tooth.  Symptoms include severe pain, swelling, redness, and drainage of pus in and around the infected tooth.  The first priority in treating a dental abscess is to drain out the pus. Treatment may also involve removing damage inside the tooth (root canal) or pulling out (extracting) the tooth. This information is not intended to replace advice given to you by your health care provider. Make sure you discuss any questions you have with  your health care provider. Document Revised: 08/16/2017 Document Reviewed: 05/06/2017 Elsevier Patient Education  2020 [employer].

## 2020-03-14 NOTE — Telephone Encounter (Signed)
Requesting return call. Confirmed phone number on chart.

## 2020-03-14 NOTE — Progress Notes (Signed)
Established Patient Office Visit  Subjective:  Patient ID: [name], female    DOB: [birth year]  Age: 49 y.o. MRN: [id number]  CC:  Chief Complaint  Patient presents with  . Dental Pain  . Medication Refill    HPI [name] presents for medication management.  Patient reports that she has been out of all of her medications for the past 3 months due to financial constraints.  Reports that she continues to have periods of depression, attributes increased stressors of relationship strife and financial constraints.  Reports that she has been having difficulty falling asleep and staying asleep, is only sleeping approximately 1 to 2 hours a night.  Does not check blood pressure at home.  Continues to have back pain, states using the Tylenol 3 once a day offered relief.  Reports that she has been having tenderness and swelling on her lower left gumline, endorses history of broken tooth.  Has not tried anything for relief, states this has been ongoing for the past 2 weeks.  Has not been able to have a dental follow-up due to financial constraints.  Reports that she has been smoking less, is only smoking approximately 2 cigarettes a day.  Reports that she has been having a productive cough with green sputum over the past 2 weeks, denies any other URI symptoms.   Past Medical History:  Diagnosis Date  . Allergy    pollen, pcn  . Asthma   . Depression   . Fibroids   . History of ovarian cyst   . Medical history non-contributory    sickle cell  . Menometrorrhagia   . 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 Sickle cell trait [facility])     Past Surgical History:  Procedure Laterality Date  . CESAREAN SECTION     all three pregnancies    Family History  Problem Relation Age of Onset  . Cancer Mother        colon cancer  . Hypertension Mother   . Heart disease Father   . Cancer Father        cancer  . Other Neg Hx     Social History   Socioeconomic  History  . Marital status: Divorced    Spouse name: Not on file  . Number of children: Not on file  . Years of education: Not on file  . Highest education level: Not on file  Occupational History  . Not on file  Tobacco Use  . Smoking status: Current Every Day Smoker    Packs/day: 0.25    Types: Cigarettes    Last attempt to quit: 02/14/2016    Years since quitting: 4.0  . Smokeless tobacco: Never Used  . Tobacco comment: 1 cig daily 09/13/17  Vaping Use  . Vaping Use: Never used  Substance and Sexual Activity  . Alcohol use: No  . Drug use: No  . Sexual activity: Yes    Partners: Male    Birth control/protection: I.U.D., None  Other Topics Concern  . Not on file  Social History Narrative  . Not on file   Social Determinants of Health   Financial Resource Strain:   . Difficulty of Paying Living Expenses:   Food Insecurity:   . Worried About Charity fundraiser in the Last Year:   . Arboriculturist in the Last Year:   Transportation Needs:   . Film/video editor (Medical):   Marland Kitchen Lack of  Transportation (Non-Medical):   Physical Activity:   . Days of Exercise per Week:   . Minutes of Exercise per Session:   Stress:   . Feeling of Stress :   Social Connections:   . Frequency of Communication with Friends and Family:   . Frequency of Social Gatherings with Friends and Family:   . Attends Religious Services:   . Active Member of Clubs or Organizations:   . Attends Archivist Meetings:   Marland Kitchen Marital Status:   Intimate Partner Violence:   . Fear of Current or Ex-Partner:   . Emotionally Abused:   Marland Kitchen Physically Abused:   . Sexually Abused:     Outpatient Medications Prior to Visit  Medication Sig Dispense Refill  . albuterol (VENTOLIN HFA) 108 (90 Base) MCG/ACT inhaler Inhale 2 puffs into the lungs every 6 (six) hours as needed for wheezing or shortness of breath. 54 g 3  . acetaminophen-codeine (TYLENOL #3) 300-30 MG tablet Take 1 tablet by mouth every 12  (twelve) hours as needed for moderate pain. 60 tablet 1  . cloNIDine (CATAPRES) 0.2 MG tablet Take 1 tablet (0.2 mg total) by mouth at bedtime. 30 tablet 2  . DULoxetine (CYMBALTA) 60 MG capsule Take 1 capsule (60 mg total) by mouth daily. 30 capsule 3  . furosemide (LASIX) 20 MG tablet Take 1 tablet (20 mg total) by mouth daily as needed. 30 tablet 2  . gabapentin (NEURONTIN) 300 MG capsule Take 2 capsules (600 mg total) by mouth 2 (two) times daily. 120 capsule 3  . meloxicam (MOBIC) 7.5 MG tablet Take 1 tablet (7.5 mg total) by mouth daily. 30 tablet 3  . traZODone (DESYREL) 50 MG tablet Take 1 tablet (50 mg total) by mouth at bedtime as needed. for sleep 30 tablet 2  . busPIRone (BUSPAR) 15 MG tablet Take 1 tablet (15 mg total) by mouth daily. 30 tablet 2  . cariprazine (VRAYLAR) capsule Take by mouth daily.    . clotrimazole (LOTRIMIN) 1 % cream Apply 1 application topically 2 (two) times daily. 45 g 1  . cyclobenzaprine (FLEXERIL) 10 MG tablet Take 1 tablet (10 mg total) by mouth 2 (two) times daily as needed for muscle spasms. 20 tablet 0  . Fluticasone-Salmeterol (ADVAIR DISKUS) 500-50 MCG/DOSE AEPB Inhale 1 puff into the lungs 2 (two) times daily. 180 each 3  . ibuprofen (ADVIL) 600 MG tablet Take 1 tablet (600 mg total) by mouth every 6 (six) hours as needed. 30 tablet 0  . lactulose (CHRONULAC) 10 GM/15ML solution Take 15 mLs (10 g total) by mouth 2 (two) times daily as needed for mild constipation. 946 mL 1  . levocetirizine (XYZAL) 5 MG tablet Take 1 tablet (5 mg total) by mouth every evening. 30 tablet 2  . lidocaine (LIDODERM) 5 % Place 1 patch onto the skin daily. Remove & Discard patch within 12 hours or as directed by MD 30 patch 3  . nicotine (NICODERM CQ) 7 mg/24hr patch Place 1 patch (7 mg total) onto the skin daily. (Patient not taking: Reported on 03/14/2020) 28 patch 1  . clindamycin (CLEOCIN) 300 MG capsule Take 1 capsule (300 mg total) by mouth 3 (three) times daily. 30  capsule 0   No facility-administered medications prior to visit.    Allergies  Allergen Reactions  . Bupropion Hcl Er (Sr) Hives and Other (See Comments)    The patient has an entire page of reactions that can be caused by the medication, but none  that she has suffered.  . Fruit & Vegetable Daily [Nutritional Supplements] Other (See Comments)    "all fruits causes bumps and weird feeling in her mouth"  . Penicillins Hives  . Pollen Extract-Tree Extract Other (See Comments)    Allergies  . Other Rash    "bugs" roaches    ROS Review of Systems  Constitutional: Positive for fatigue. Negative for chills and fever.  HENT: Negative for sinus pressure and sinus pain.   Eyes: Negative.   Respiratory: Positive for cough. Negative for shortness of breath and wheezing.   Cardiovascular: Negative.   Gastrointestinal: Negative.   Endocrine: Negative.   Genitourinary: Negative.   Musculoskeletal: Positive for back pain.  Skin: Negative.   Allergic/Immunologic: Negative.   Neurological: Negative.   Psychiatric/Behavioral: Positive for dysphoric mood and sleep disturbance. Negative for self-injury and suicidal ideas. The patient is nervous/anxious.       Objective:    Physical Exam Vitals and nursing note reviewed.  Constitutional:      General: She is not in acute distress.    Appearance: Normal appearance. She is obese. She is not ill-appearing.  HENT:     Head: Normocephalic and atraumatic.     Right Ear: Tympanic membrane, ear canal and external ear normal.     Left Ear: Tympanic membrane, ear canal and external ear normal.     Nose: Nose normal.     Mouth/Throat:     Lips: Pink.     Mouth: Mucous membranes are moist.     Dentition: Dental tenderness, dental caries and dental abscesses present.     Pharynx: Oropharynx is clear.   Eyes:     Extraocular Movements: Extraocular movements intact.     Conjunctiva/sclera: Conjunctivae normal.     Pupils: Pupils are equal, round,  and reactive to light.  Cardiovascular:     Rate and Rhythm: Normal rate and regular rhythm.     Pulses: Normal pulses.     Heart sounds: Normal heart sounds.  Pulmonary:     Effort: Pulmonary effort is normal.     Breath sounds: Normal breath sounds. No wheezing or rhonchi.  Abdominal:     General: Abdomen is flat. Bowel sounds are normal.     Palpations: Abdomen is soft.  Musculoskeletal:     Cervical back: Normal, normal range of motion and neck supple.     Thoracic back: Normal.     Lumbar back: Tenderness present.  Skin:    General: Skin is warm and dry.  Neurological:     General: No focal deficit present.     Mental Status: She is alert and oriented to person, place, and time.  Psychiatric:        Mood and Affect: Mood normal.        Behavior: Behavior normal.        Thought Content: Thought content normal.        Judgment: Judgment normal.     BP 132/89 (BP Location: Left Arm, Patient Position: Sitting, Cuff Size: Large)   Pulse 91   Temp 98.5 F (36.9 C) (Oral)   Resp 18   Ht 5\' 1"  (1.549 m)   Wt 203 lb (92.1 kg)   SpO2 96%   BMI 38.36 kg/m  Wt Readings from Last 3 Encounters:  03/14/20 203 lb (92.1 kg)  12/09/19 206 lb 9.6 oz (93.7 kg)  06/17/19 219 lb (99.3 kg)     Health Maintenance Due  Topic Date Due  . Hepatitis  C Screening  Never done  . COVID-19 Vaccine (1) Never done  . MAMMOGRAM  12/12/2019  . COLONOSCOPY  Never done    There are no preventive care reminders to display for this patient.  Lab Results  Component Value Date   TSH 3.100 12/11/2016   Lab Results  Component Value Date   WBC 7.2 04/18/2015   HGB 12.6 08/02/2016   HCT 37.0 08/02/2016   MCV 78.6 04/18/2015   PLT 314 04/18/2015   Lab Results  Component Value Date   NA 143 12/09/2019   K 3.7 12/09/2019   CO2 25 12/09/2019   GLUCOSE 117 (H) 12/09/2019   BUN 9 12/09/2019   CREATININE 1.00 12/09/2019   BILITOT <0.2 01/02/2019   ALKPHOS 109 01/02/2019   AST 15  01/02/2019   ALT 11 01/02/2019   PROT 6.1 01/02/2019   ALBUMIN 3.9 01/02/2019   CALCIUM 9.1 12/09/2019   ANIONGAP 8 04/18/2015   Lab Results  Component Value Date   CHOL 159 01/02/2019   Lab Results  Component Value Date   HDL 42 01/02/2019   Lab Results  Component Value Date   LDLCALC 90 01/02/2019   Lab Results  Component Value Date   TRIG 137 01/02/2019   Lab Results  Component Value Date   CHOLHDL 3.8 01/02/2019   Lab Results  Component Value Date   HGBA1C 5.3 (A) 03/11/2019      Assessment & Plan:   Problem List Items Addressed This Visit      Other   Depression - Primary   Relevant Medications   traZODone (DESYREL) 50 MG tablet   DULoxetine (CYMBALTA) 60 MG capsule   hydrOXYzine (ATARAX/VISTARIL) 10 MG tablet   Spinal stenosis, lumbar region, with neurogenic claudication   Relevant Medications   traZODone (DESYREL) 50 MG tablet   meloxicam (MOBIC) 7.5 MG tablet   DULoxetine (CYMBALTA) 60 MG capsule   gabapentin (NEURONTIN) 300 MG capsule   acetaminophen-codeine (TYLENOL #3) 300-30 MG tablet   Insomnia   Relevant Medications   traZODone (DESYREL) 50 MG tablet   Other Relevant Orders   TSH   Vitamin D, 25-hydroxy    Other Visit Diagnoses    Anxiety and depression       Relevant Medications   traZODone (DESYREL) 50 MG tablet   DULoxetine (CYMBALTA) 60 MG capsule   hydrOXYzine (ATARAX/VISTARIL) 10 MG tablet   Other Relevant Orders   Vitamin D, 25-hydroxy   Pedal edema       Relevant Medications   furosemide (LASIX) 20 MG tablet   Hot flashes       Relevant Medications   furosemide (LASIX) 20 MG tablet   cloNIDine (CATAPRES) 0.2 MG tablet   Encounter for HCV screening test for low risk patient       Relevant Orders   Hepatitis c antibody (reflex)   Screening for metabolic disorder       Relevant Orders   CBC with Differential/Platelet   Comp. Metabolic Panel (12)   Lipid panel   Dental infection       Relevant Medications    sulfamethoxazole-trimethoprim (BACTRIM DS) 800-160 MG tablet    1. Spinal stenosis, lumbar region, with neurogenic claudication Resume regimen.  Patient given information on financial assistance [facility]. [facility] f/u appt at [facility] on 04/04/20 - meloxicam (MOBIC) 7.5 MG tablet; Take 1 tablet (7.5 mg total) by mouth daily.  Dispense: 30 tablet; Refill: 3 - gabapentin (NEURONTIN) 300 MG capsule; Take  2 capsules (600 mg total) by mouth 2 (two) times daily.  Dispense: 120 capsule; Refill: 3 - acetaminophen-codeine (TYLENOL #3) 300-30 MG tablet; Take 1 tablet by mouth every 12 (twelve) hours as needed for moderate pain.  Dispense: 60 tablet; Refill: 1   2. Other insomnia Resume trazodone - traZODone (DESYREL) 50 MG tablet; Take 1 tablet (50 mg total) by mouth at bedtime as needed. for sleep  Dispense: 30 tablet; Refill: 2 - TSH - Vitamin D, 25-hydroxy  3. Anxiety and depression Resume Cymbalta, trial hydroxyzine 10 mg every 8 hours as needed - DULoxetine (CYMBALTA) 60 MG capsule; Take 1 capsule (60 mg total) by mouth daily.  Dispense: 30 capsule; Refill: 3 - hydrOXYzine (ATARAX/VISTARIL) 10 MG tablet; Take 1 tablet (10 mg total) by mouth 3 (three) times daily as needed.  Dispense: 30 tablet; Refill: 0 - Vitamin D, 25-hydroxy  4. Pedal edema Resume Lasix as needed - furosemide (LASIX) 20 MG tablet; Take 1 tablet (20 mg total) by mouth daily as needed.  Dispense: 30 tablet; Refill: 2  5. Hot flashes Resume clonidine at bedtime - cloNIDine (CATAPRES) 0.2 MG tablet; Take 1 tablet (0.2 mg total) by mouth at bedtime.  Dispense: 30 tablet; Refill: 2  6. Encounter for HCV screening test for low risk patient  - Hepatitis c antibody (reflex)  7. Screening for metabolic disorder  - CBC with Differential/Platelet - Comp. Metabolic Panel (12) - Lipid panel  8. Dental infection Encourage patient to have dental follow-up as soon as possible - sulfamethoxazole-trimethoprim  (BACTRIM DS) 800-160 MG tablet; Take 1 tablet by mouth 2 (two) times daily.  Dispense: 10 tablet; Refill: 0   I have reviewed the patient's medical history (PMH, PSH, Social History, Family History, Medications, and allergies) , and have been updated if relevant. I spent 40 minutes reviewing chart and  face to face time with patient.    Meds ordered this encounter  Medications  . traZODone (DESYREL) 50 MG tablet    Sig: Take 1 tablet (50 mg total) by mouth at bedtime as needed. for sleep    Dispense:  30 tablet    Refill:  2    Order Specific Question:   Supervising Provider    Answer:   Joya Gaskins, PATRICK E [[id number]]  . meloxicam (MOBIC) 7.5 MG tablet    Sig: Take 1 tablet (7.5 mg total) by mouth daily.    Dispense:  30 tablet    Refill:  3    Order Specific Question:   Supervising Provider    Answer:   Joya Gaskins, PATRICK E [[id number]]  . DULoxetine (CYMBALTA) 60 MG capsule    Sig: Take 1 capsule (60 mg total) by mouth daily.    Dispense:  30 capsule    Refill:  3    Discontinue Prozac    Order Specific Question:   Supervising Provider    Answer:   Asencion Noble E [[id number]]  . gabapentin (NEURONTIN) 300 MG capsule    Sig: Take 2 capsules (600 mg total) by mouth 2 (two) times daily.    Dispense:  120 capsule    Refill:  3    Order Specific Question:   Supervising Provider    Answer:   Joya Gaskins, PATRICK E [[id number]]  . furosemide (LASIX) 20 MG tablet    Sig: Take 1 tablet (20 mg total) by mouth daily as needed.    Dispense:  30 tablet    Refill:  2    Order Specific Question:  Supervising Provider    Answer:   Noralyn Pick  . cloNIDine (CATAPRES) 0.2 MG tablet    Sig: Take 1 tablet (0.2 mg total) by mouth at bedtime.    Dispense:  30 tablet    Refill:  2    Order Specific Question:   Supervising Provider    Answer:   Joya Gaskins, PATRICK E [[id number]]  . hydrOXYzine (ATARAX/VISTARIL) 10 MG tablet    Sig: Take 1 tablet (10 mg total) by mouth 3 (three) times daily as needed.    Dispense:   30 tablet    Refill:  0    Order Specific Question:   Supervising Provider    Answer:   Joya Gaskins, PATRICK E [[id number]]  . sulfamethoxazole-trimethoprim (BACTRIM DS) 800-160 MG tablet    Sig: Take 1 tablet by mouth 2 (two) times daily.    Dispense:  10 tablet    Refill:  0    Order Specific Question:   Supervising Provider    Answer:   Joya Gaskins, PATRICK E [[id number]]  . acetaminophen-codeine (TYLENOL #3) 300-30 MG tablet    Sig: Take 1 tablet by mouth every 12 (twelve) hours as needed for moderate pain.    Dispense:  60 tablet    Refill:  1    Fill after 01/05/20    Order Specific Question:   Supervising Provider    Answer:   Elsie Stain [[id number]]    Follow-up: Return if symptoms worsen or fail to improve.    Loraine Grip Mayers, PA-C

## 2020-03-15 ENCOUNTER — Telehealth: Payer: Self-pay | Admitting: Family Medicine

## 2020-03-15 ENCOUNTER — Other Ambulatory Visit: Payer: Self-pay | Admitting: Family Medicine

## 2020-03-15 DIAGNOSIS — J4530 Mild persistent asthma, uncomplicated: Secondary | ICD-10-CM

## 2020-03-15 NOTE — Telephone Encounter (Signed)
Patient called in and requested for a call from the financial counselor. Please follow up at your earliest convenience.

## 2020-03-15 NOTE — Telephone Encounter (Signed)
Patient called in and requested for a call from the [facility].Marland Kitchen Please follow up at your earliest convenience.

## 2020-03-15 NOTE — Telephone Encounter (Signed)
Already spoke with the Pt

## 2020-03-15 NOTE — Telephone Encounter (Signed)
Pt was explain what she need to have to apply for CAFA and she already have her one time temp Rx card

## 2020-03-16 MED FILL — FLUTICASONE-SALMETEROL 500-: 500-50 | 30 days supply | Qty: 60 | Fill #1

## 2020-03-16 NOTE — Telephone Encounter (Signed)
Follow up call placed to patient. Patient shared ongoing psychosocial stressors that are negatively impacting mental health conditions. Pt scheduled an IBH appointment for 03/23/20 to further assess.

## 2020-03-23 ENCOUNTER — Ambulatory Visit: Payer: Self-pay | Attending: Family Medicine | Admitting: Licensed Clinical Social Worker

## 2020-03-23 ENCOUNTER — Other Ambulatory Visit: Payer: Self-pay

## 2020-03-23 DIAGNOSIS — F411 Generalized anxiety disorder: Secondary | ICD-10-CM

## 2020-03-30 NOTE — BH Specialist Note (Signed)
Integrated Behavioral Health Initial Visit  MRN: [id number] Name: [name]  Number of [place] Clinician visits:: 1/6 Session Start time: 10:45 AM  Session End time: 11:10 AM Total time: 25  Type of Service: [place] Interpretor:No. Interpretor Name and Language: NA   SUBJECTIVE: [name] is a 49 y.o. female accompanied by self Patient was referred by Dr. Margarita Rana for depression and anxiety. Patient reports the following symptoms/concerns: Pt reports increase in mental health conditions triggered by psychosocial stressors. Pt obtained BH medications last week through [facility]; however, reports feeling "weird", sleeping through the day, and becomes light-headed at times Duration of problem: Ongoing; Severity of problem: moderate  OBJECTIVE: Mood: Anxious and Affect: Appropriate Risk of harm to self or others: No plan to harm self or others  LIFE CONTEXT: Family and Social: Pt resides with adult daughter School/Work: Pt is uninsured and has no income Self-Care: Pt has been practicing self-care activities and exercising  Life Changes: Pt has obtained medication to better manage physical and mental health conditions  GOALS ADDRESSED: Patient will: 1. Reduce symptoms of: anxiety, depression and stress 2. Increase knowledge and/or ability of: coping skills and healthy habits  3. Demonstrate ability to: Increase healthy adjustment to current life circumstances, Increase adequate support systems for patient/family and Increase motivation to adhere to plan of care  INTERVENTIONS: Interventions utilized: Solution-Focused Strategies and Supportive Counseling  Standardized Assessments completed: GAD-7 and PHQ 2&9  ASSESSMENT: Patient currently experiencing increase in anxiety and depression symptoms triggered by psychosocial stressors.   Patient may benefit from continued medication managment. LCSW discussed therapeutic  strategies to assist in decrease and/or management of symptoms. Pt is interested in participating in [facility] and an appointment to complete CCA was scheduled.  PLAN: 1. Follow up with behavioral health clinician on : 04/04/2020 2. Behavioral recommendations: Continue to comply with medications and utilize healthy coping skills 3. Referral(s): [place] (In Clinic) 4. "From scale of 1-10, how likely are you to follow plan?":   Rebekah Chesterfield, LCSW 03/30/2020 6:48 AM

## 2020-04-04 ENCOUNTER — Encounter: Payer: Self-pay | Admitting: Family

## 2020-04-04 ENCOUNTER — Ambulatory Visit (HOSPITAL_BASED_OUTPATIENT_CLINIC_OR_DEPARTMENT_OTHER): Payer: 59 | Admitting: Licensed Clinical Social Worker

## 2020-04-04 ENCOUNTER — Ambulatory Visit: Payer: 59 | Attending: Family | Admitting: Family

## 2020-04-04 ENCOUNTER — Other Ambulatory Visit: Payer: Self-pay

## 2020-04-04 VITALS — BP 124/85 | HR 78 | Temp 98.1°F | Resp 18 | Ht 61.0 in | Wt 208.0 lb

## 2020-04-04 DIAGNOSIS — F331 Major depressive disorder, recurrent, moderate: Secondary | ICD-10-CM | POA: Diagnosis not present

## 2020-04-04 DIAGNOSIS — G4709 Other insomnia: Secondary | ICD-10-CM | POA: Diagnosis not present

## 2020-04-04 DIAGNOSIS — F329 Major depressive disorder, single episode, unspecified: Secondary | ICD-10-CM

## 2020-04-04 DIAGNOSIS — F419 Anxiety disorder, unspecified: Secondary | ICD-10-CM

## 2020-04-04 DIAGNOSIS — M48062 Spinal stenosis, lumbar region with neurogenic claudication: Secondary | ICD-10-CM | POA: Diagnosis not present

## 2020-04-04 DIAGNOSIS — R232 Flushing: Secondary | ICD-10-CM

## 2020-04-04 DIAGNOSIS — Z720 Tobacco use: Secondary | ICD-10-CM

## 2020-04-04 DIAGNOSIS — F411 Generalized anxiety disorder: Secondary | ICD-10-CM | POA: Diagnosis not present

## 2020-04-04 MED FILL — ACETAMINOPHEN-COD #3 TABLET: 300-30 | 7 days supply | Qty: 14 | Fill #0
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
## 2020-04-04 NOTE — Patient Instructions (Addendum)
Tylenol #3, Meloxicam, and Gabapentin for back pain. Clonidine for hot flashes. Duloxetine for anxiety/depression. Trazodone for insomnia. Nicotine patch for smoking cessation. Social Work consult during today's visit. Follow-up with primary physician in 3 months or sooner if needed. Chronic Back Pain When back pain lasts longer than 3 months, it is called chronic back pain. Pain may get worse at certain times (flare-ups). There are things you can do at home to manage your pain. Follow these instructions at home: Activity      Avoid bending and other activities that make pain worse.  When standing: ? Keep your upper back and neck straight. ? Keep your shoulders pulled back. ? Avoid slouching.  When sitting: ? Keep your back straight. ? Relax your shoulders. Do not round your shoulders or pull them backward.  Do not sit or stand in one place for long periods of time.  Take short rest breaks during the day. Lying down or standing is usually better than sitting. Resting can help relieve pain.  When sitting or lying down for a long time, do some mild activity or stretching. This will help to prevent stiffness and pain.  Get regular exercise. Ask your doctor what activities are safe for you.  Do not lift anything that is heavier than 10 lb (4.5 kg). To prevent injury when you lift things: ? Bend your knees. ? Keep the weight close to your body. ? Avoid twisting. Managing pain  If told, put ice on the painful area. Your doctor may tell you to use ice for 24-48 hours after a flare-up starts. ? Put ice in a plastic bag. ? Place a towel between your skin and the bag. ? Leave the ice on for 20 minutes, 2-3 times a day.  If told, put heat on the painful area as often as told by your doctor. Use the heat source that your doctor recommends, such as a moist heat pack or a heating pad. ? Place a towel between your skin and the heat source. ? Leave the heat on for 20-30 minutes. ? Remove  the heat if your skin turns bright red. This is especially important if you are unable to feel pain, heat, or cold. You may have a greater risk of getting burned.  Soak in a warm bath. This can help relieve pain.  Take over-the-counter and prescription medicines only as told by your doctor. General instructions  Sleep on a firm mattress. Try lying on your side with your knees slightly bent. If you lie on your back, put a pillow under your knees.  Keep all follow-up visits as told by your doctor. This is important. Contact a doctor if:  You have pain that does not get better with rest or medicine. Get help right away if:  One or both of your arms or legs feel weak.  One or both of your arms or legs lose feeling (numbness).  You have trouble controlling when you poop (bowel movement) or pee (urinate).  You feel sick to your stomach (nauseous).  You throw up (vomit).  You have belly (abdominal) pain.  You have shortness of breath.  You pass out (faint). Summary  When back pain lasts longer than 3 months, it is called chronic back pain.  Pain may get worse at certain times (flare-ups).  Use ice and heat as told by your doctor. Your doctor may tell you to use ice after flare-ups. This information is not intended to replace advice given to you by your  health care provider. Make sure you discuss any questions you have with your health care provider. Document Revised: 12/25/2018 Document Reviewed: 04/18/2017 Elsevier Patient Education  2020 [employer].

## 2020-04-04 NOTE — Progress Notes (Signed)
Patient ID: [name], female    DOB: 09/11/1971  MRN: [id number]  CC: Chronic Conditions Follow-Up  Subjective: [name] is a 49 y.o. 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  1. BACK PAIN FOLLOW-UP: Location: bilateral lower back Onset: chronic condition Description: 9/10 sharp pain, constant  Radiation: bilateral legs and hips Modifying factors: denies  Symptoms Worse with: none Better with: none Trauma: none Popping/clicking sounds with movement/bending: denies Muscle spasms: denies   Red Flags Fecal/urinary incontinence: denies Weakness: denies Fever/chills: denies Night pain: denies, reports she is resting well  No relief with bedrest: denies  Comments: Last visit 12/09/2019 with Dr. Margarita Rana. During that encounter spinal stenosis stable. Patient on pain contract. Continued on current regimen. Acetaminophen-codeine, Gabapentin, and Meloxicam.   Today reports she is just getting prescriptions filled. Reports she has been 3 months without medication as she did not have any health insurance.  2. HOT FLASHES FOLLOW-UP: Last visit 12/09/2019 with Dr. Margarita Rana. During that encounter patient stable. Continued on Clonidine.   Today reports hot flashes are about the same. Reports she is getting prescriptions filled today. Reports she has not had relief as she has been 3 months without medication because of financial reasons.   3. ANXIETY FOLLOW-UP: Last visit 12/09/2019 with Dr. Margarita Rana. During that encounter patient stable. Continued on Duloxetine.  Today reports she is doing well on Duloxetine. Denies thoughts of self-harm and suicidal ideation.   4. INSOMNIA FOLLOW-UP: Last visit 12/09/2019 with Dr. Margarita Rana. During that encounter insomnia controlled. Trazodone  continued.   Today reports she has been without medication for financial reasons. Reports she began taking medication 2 weeks ago and feels better. Reports she is getting plenty of rest.   5. TOBACCO FOLLOW-UP: Last visit 12/09/2019 with Dr. Margarita Rana. During that encounter patient started on Nicotine patch.  Today reports she is smoking about 2 cigarettes daily. Says she was smoking 1 cigarette daily. Reports smoking for 31 years. Reports she plans to begin taking Nicotine patch soon.    Patient Active Problem List   Diagnosis Date Noted  . Tobacco abuse 09/15/2018  . Visual disturbance 04/24/2018  . Obesity 12/12/2017  . Insomnia 03/13/2017  . Anxiety associated with depression 01/09/2017  . Tooth ache 05/24/2016  . Spinal stenosis, lumbar region, with neurogenic claudication 05/15/2016  . Cervical disc disorder with radiculopathy of cervical region 05/15/2016  . Vaginal atrophy 04/30/2016  . Asthma 02/20/2016  . Depression 02/20/2016  . Myopathy 02/20/2016  . Back pain 02/20/2016  . Neuropathy 02/20/2016  . Fibroids   . History of ovarian cyst   . Menometrorrhagia 04/09/2011     Current Outpatient Medications on File Prior to Visit  Medication Sig Dispense Refill  . acetaminophen-codeine (TYLENOL #3) 300-30 MG tablet Take 1 tablet by mouth every 12 (twelve) hours as needed for moderate pain. 60 tablet 1  . busPIRone (BUSPAR) 15 MG tablet Take 1 tablet (15 mg total) by mouth daily. 30 tablet 2  . cariprazine (VRAYLAR) capsule Take by mouth daily.    . cloNIDine (CATAPRES) 0.2 MG tablet Take 1 tablet (0.2 mg total) by mouth at bedtime. 30 tablet 2  . clotrimazole (LOTRIMIN) 1 % cream Apply 1 application topically 2 (two) times daily. 45 g 1  . cyclobenzaprine (FLEXERIL) 10 MG tablet Take 1 tablet (10 mg total) by mouth 2 (  two) times daily as needed for muscle spasms. 20 tablet 0  . DULoxetine (CYMBALTA) 60 MG capsule Take 1 capsule (60 mg total) by mouth daily. 30 capsule 3  .  Fluticasone-Salmeterol (ADVAIR DISKUS) 500-50 MCG/DOSE AEPB Inhale 1 puff into the lungs 2 (two) times daily. 180 each 3  . furosemide (LASIX) 20 MG tablet Take 1 tablet (20 mg total) by mouth daily as needed. 30 tablet 2  . gabapentin (NEURONTIN) 300 MG capsule Take 2 capsules (600 mg total) by mouth 2 (two) times daily. 120 capsule 3  . hydrOXYzine (ATARAX/VISTARIL) 10 MG tablet Take 1 tablet (10 mg total) by mouth 3 (three) times daily as needed. 30 tablet 0  . ibuprofen (ADVIL) 600 MG tablet Take 1 tablet (600 mg total) by mouth every 6 (six) hours as needed. 30 tablet 0  . lactulose (CHRONULAC) 10 GM/15ML solution Take 15 mLs (10 g total) by mouth 2 (two) times daily as needed for mild constipation. 946 mL 1  . levocetirizine (XYZAL) 5 MG tablet Take 1 tablet (5 mg total) by mouth every evening. 30 tablet 2  . lidocaine (LIDODERM) 5 % Place 1 patch onto the skin daily. Remove & Discard patch within 12 hours or as directed by MD 30 patch 3  . meloxicam (MOBIC) 7.5 MG tablet Take 1 tablet (7.5 mg total) by mouth daily. 30 tablet 3  . nicotine (NICODERM CQ) 7 mg/24hr patch Place 1 patch (7 mg total) onto the skin daily. (Patient not taking: Reported on 03/14/2020) 28 patch 1  . sulfamethoxazole-trimethoprim (BACTRIM DS) 800-160 MG tablet Take 1 tablet by mouth 2 (two) times daily. 10 tablet 0  . traZODone (DESYREL) 50 MG tablet Take 1 tablet (50 mg total) by mouth at bedtime as needed. for sleep 30 tablet 2  . VENTOLIN HFA 108 (90 Base) MCG/ACT inhaler INHALE 2 PUFFS INTO THE LUNGS EVERY 6 (SIX) HOURS AS NEEDED FOR WHEEZING OR SHORTNESS OF BREATH. 18 g 3  . [DISCONTINUED] nystatin (MYCOSTATIN) 100000 UNIT/ML suspension Take 5 mLs (500,000 Units total) by mouth 4 (four) times daily. (Patient not taking: Reported on 06/17/2019) 120 mL 0   No current facility-administered medications on file prior to visit.    Allergies  Allergen Reactions  . Bupropion Hcl Er (Sr) Hives and Other (See Comments)     The patient has an entire page of reactions that can be caused by the medication, but none that she has suffered.  . Fruit & Vegetable Daily [Nutritional Supplements] Other (See Comments)    "all fruits causes bumps and weird feeling in her mouth"  . Penicillins Hives  . Pollen Extract-Tree Extract Other (See Comments)    Allergies  . Other Rash    "bugs" roaches    Social History   Socioeconomic History  . Marital status: Divorced    Spouse name: Not on file  . Number of children: Not on file  . Years of education: Not on file  . Highest education level: Not on file  Occupational History  . Not on file  Tobacco Use  . Smoking status: Current Every Day Smoker    Packs/day: 0.25    Types: Cigarettes    Last attempt to quit: 02/14/2016    Years since quitting: 4.1  . Smokeless tobacco: Never Used  . Tobacco comment: 1 cig daily 09/13/17  Vaping Use  . Vaping Use: Never used  Substance and Sexual Activity  . Alcohol use: No  . Drug use: No  .  Sexual activity: Yes    Partners: Male    Birth control/protection: I.U.D., None  Other Topics Concern  . Not on file  Social History Narrative  . Not on file   Social Determinants of Health   Financial Resource Strain:   . Difficulty of Paying Living Expenses:   Food Insecurity:   . Worried About Charity fundraiser in the Last Year:   . Arboriculturist in the Last Year:   Transportation Needs:   . Film/video editor (Medical):   Marland Kitchen Lack of Transportation (Non-Medical):   Physical Activity:   . Days of Exercise per Week:   . Minutes of Exercise per Session:   Stress:   . Feeling of Stress :   Social Connections:   . Frequency of Communication with Friends and Family:   . Frequency of Social Gatherings with Friends and Family:   . Attends Religious Services:   . Active Member of Clubs or Organizations:   . Attends Archivist Meetings:   Marland Kitchen Marital Status:   Intimate Partner Violence:   . Fear of Current or  Ex-Partner:   . Emotionally Abused:   Marland Kitchen Physically Abused:   . Sexually Abused:     Family History  Problem Relation Age of Onset  . Cancer Mother        colon cancer  . Hypertension Mother   . Heart disease Father   . Cancer Father        cancer  . Other Neg Hx     Past Surgical History:  Procedure Laterality Date  . CESAREAN SECTION     all three pregnancies    ROS: Review of Systems Negative except as stated above  PHYSICAL EXAM: Vitals with BMI 04/04/2020 03/14/2020 12/09/2019  Height 5\' 1"  5\' 1"  -  Weight 208 lbs 203 lbs -  BMI 92.33 00.76 -  Systolic 226 333 545  Diastolic 85 89 70  Pulse 78 91 -  Some encounter information is confidential and restricted. Go to Review Flowsheets activity to see all data.  SpO2- 99%, room air  Temperature- 98.59F, oral  Physical Exam General appearance - alert, well appearing, and in no distress, oriented to person, place, and time and overweight Mental status - alert, oriented to person, place, and time, normal mood, behavior, speech, dress, motor activity, and thought processes Neck - supple, no significant adenopathy Lymphatics - no palpable lymphadenopathy, no hepatosplenomegaly Chest - clear to auscultation, no wheezes, rales or rhonchi, symmetric air entry, no tachypnea, retractions or cyanosis Heart - normal rate, regular rhythm, normal S1, S2, no murmurs, rubs, clicks or gallops Neurological - alert, oriented, normal speech, no focal findings or movement disorder noted, cranial nerves II through XII intact Musculoskeletal -  Lumbar back: tenderness present, no edema Right hip: decreased range of motion Left hip: decreased range of motion Right knee: decreased range of motion, restricted flexion, no edema Left knee; decreased range of motion, restricted flexion, no edema Bilateral positive straight leg raise Extremities - peripheral pulses normal, no pedal edema, no clubbing or cyanosis Skin- warm and dry, no rash noted    ASSESSMENT AND PLAN: 1. Spinal stenosis, lumbar region, with neurogenic claudication: -Patient with stable spinal stenosis.  -Pain is constant.  -Continue Acetaminophen-Codeine, Gabapentin, and Meloxicam as prescribed. Refills available on file since 12/09/2019. Patient reports she was unable to get medications at that time because of financial reasons. Reports she is just getting prescriptions filled today.  -I did  check the [facility] prescription drug database and found no frequent prescribers of opiates or evidence of aberrant behavior. -Counseled patient that Acetaminophen-Codeine  is a controlled substance which can not be shared with others, sold to others, that a random urine drug screen may be requested to determined if patient is taking the medication, do not consume medication with alcohol, and to not take this medication while operating hazardous machinery including driving as the medication may cause drowsiness/dizziness/falls/accidents/severe injuries. Patient verbalized understanding.  -Follow-up with primary physician in 3 months or sooner if needed.  2. Hot flashes: -Patient reports hot flashes are about the same since last visit. Reports she is just now able to fill prescription related to financial reasons.  -Continue Clonidine as prescribed. Refills available on file. -Follow-up with primary physician in 3 months or sooner if needed.   3. Anxiety and depression: -Reports doing well on current dose of Duloxetine. Continue as prescribed. Refills available on file.  -Denies thoughts of self-harm and suicidal ideation.  -Education officer, museum will assist patient during today's visit.  -Follow-up with primary physician in 3 months or sooner if needed.   4. Other insomnia: -Insomnia stable. Reports she is resting well at night.  -Continue Trazodone as prescribed. Refills available on file. -Follow-up with primary physician in 3 months or sooner if needed.  -Sleep as long as  necessary to feel rested (usually seven to eight hours for adults) and then get out of bed ?Maintain a regular sleep schedule, particularly a regular wake-up time in the morning ?Try not to force sleep ?Avoid caffeinated beverages after lunch ?Avoid alcohol near bedtime (eg, late afternoon and evening)  ?Avoid smoking or other nicotine intake, particularly during the evening ?Adjust the bedroom environment as needed to decrease stimuli (eg, reduce ambient light, turn off the television or radio) ?Avoid prolonged use of light-emitting screens (laptops, tablets, smartphones, ebooks) before bedtime  ?Resolve concerns or worries before bedtime ?Exercise regularly for at least 20 minutes, preferably more than four to five hours prior to bedtime  ?Avoid daytime naps, especially if they are longer than 20 to 30 minutes or occur late in the day  5. Tobacco abuse:  -Patient reports smoking 2 cigarettes daily. Previously smoking 1 cigarette daily. Reports has smoked for 31 years.  -Begin Nicotine Patch. Reports she just got prescription filled related to financial reasons. -Patient counseled to quit smoking. Discussed health risks associated with smoking including lung and other types of cancers, chronic lung diseases and cardiovascular risks. -Follow-up with primary physician in 3 months or sooner if needed.   Patient was given the opportunity to ask questions.  Patient verbalized understanding of the plan and was able to repeat key elements of the plan. Patient was given clear instructions to go to Emergency Department or return to medical center if symptoms don't improve, worsen, or new problems develop.The patient verbalized understanding.  Cheryl Herter, NP

## 2020-04-04 NOTE — Progress Notes (Signed)
Pt is here to f /u o her depression/ anxiety  Stated is much better since started meds

## 2020-04-11 ENCOUNTER — Telehealth: Payer: Self-pay | Admitting: Licensed Clinical Social Worker

## 2020-04-11 ENCOUNTER — Ambulatory Visit: Payer: 59 | Attending: Family Medicine | Admitting: Licensed Clinical Social Worker

## 2020-04-11 ENCOUNTER — Other Ambulatory Visit: Payer: Self-pay

## 2020-04-11 NOTE — Telephone Encounter (Signed)
Call placed to patient regarding scheduled [facility] appointment. LCSW left message requesting a return call.  

## 2020-04-22 ENCOUNTER — Ambulatory Visit (HOSPITAL_COMMUNITY)
Admission: EM | Admit: 2020-04-22 | Discharge: 2020-04-22 | Disposition: A | Discharge status: Home / Self Care | Payer: 59 | Attending: Family Medicine | Admitting: Family Medicine

## 2020-04-22 ENCOUNTER — Encounter (HOSPITAL_COMMUNITY): Payer: Self-pay

## 2020-04-22 ENCOUNTER — Other Ambulatory Visit: Payer: Self-pay

## 2020-04-22 DIAGNOSIS — K047 Periapical abscess without sinus: Secondary | ICD-10-CM

## 2020-04-22 MED ORDER — CEFTRIAXONE SODIUM 1 G IJ SOLR
INTRAMUSCULAR | Status: AC
  Filled 2020-04-22: qty 10

## 2020-04-22 MED ORDER — CEFTRIAXONE SODIUM 1 G IJ SOLR
1.0000 g | Freq: Once | INTRAMUSCULAR | Status: AC
  Administered 2020-04-22: 1 g via INTRAMUSCULAR

## 2020-04-22 MED ORDER — HYDROCODONE-ACETAMINOPHEN 5-325 MG PO TABS: 1.0000 | ORAL_TABLET | Freq: Four times a day (QID) | ORAL | 0 refills | Status: DC | PRN

## 2020-04-22 MED ORDER — CLINDAMYCIN HCL 300 MG PO CAPS
300.0000 mg | ORAL_CAPSULE | Freq: Three times a day (TID) | ORAL | 0 refills | Status: AC
Start: 2020-04-22 — End: 2020-04-29

## 2020-04-22 MED ORDER — HYDROCODONE-ACETAMINOPHEN 5-325 MG PO TABS: 2.0000 | ORAL_TABLET | ORAL | 0 refills | Status: DC | PRN

## 2020-04-22 MED ORDER — IBUPROFEN 800 MG PO TABS
800.0000 mg | ORAL_TABLET | Freq: Three times a day (TID) | ORAL | 0 refills | Status: DC
Start: 2020-04-22 — End: 2023-12-31

## 2020-04-22 MED FILL — CLINDAMYCIN HCL 300 MG CAPS: 300 | 7 days supply | Qty: 21 | Fill #0

## 2020-04-22 MED FILL — IBUPROFEN 800 MG TABLET: 800 | 7 days supply | Qty: 21 | Fill #0

## 2020-04-22 NOTE — Discharge Instructions (Signed)
Please use dental resource to contact offices to seek permenant treatment/relief.   Begin clindamycin every 8 hours for 1 week  For pain please take 600mg -800mg  of Ibuprofen every 8 hours, take with 1000 mg of Tylenol Extra strength every 8 hours. These are safe to take together. Please take with food.   I have also provided 2 days worth of stronger pain medication. This should only be used for severe pain. Do not drive or operate machinery while taking this medication.   Please return if you start to experience significant swelling of your face, experiencing fever, no improvement in 48 hours

## 2020-04-22 NOTE — ED Provider Notes (Signed)
[facility]    CSN: [id number] Arrival date & time: 04/22/20  1034      History   Chief Complaint Chief Complaint  Patient presents with  . Dental Pain    HPI [name] is a 49 y.o. female presenting today for evaluation of dental pain and swelling.  Patient reports over the past 24 hours she has developed increased pain and swelling to her left lower jaw.  Does report a broken tooth in this area and has had recurrent issues with this tooth over the past few months.  Is not taking any medicines.  Denies any dizziness or lightheadedness.  Denies neck stiffness.  Does have some discomfort with swallowing, but denies any difficulty swallowing.  HPI  Past Medical History:  Diagnosis Date  . Allergy    pollen, pcn  . Asthma   . Depression   . Fibroids   . History of ovarian cyst   . Medical history non-contributory    sickle cell  . Menometrorrhagia   . 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 Sickle cell trait [facility])     Patient Active Problem List   Diagnosis Date Noted  . Tobacco abuse 09/15/2018  . Visual disturbance 04/24/2018  . Obesity 12/12/2017  . Insomnia 03/13/2017  . Anxiety associated with depression 01/09/2017  . Tooth ache 05/24/2016  . Spinal stenosis, lumbar region, with neurogenic claudication 05/15/2016  . Cervical disc disorder with radiculopathy of cervical region 05/15/2016  . Vaginal atrophy 04/30/2016  . Asthma 02/20/2016  . Depression 02/20/2016  . Myopathy 02/20/2016  . Back pain 02/20/2016  . Neuropathy 02/20/2016  . Fibroids   . History of ovarian cyst   . Menometrorrhagia 04/09/2011    Past Surgical History:  Procedure Laterality Date  . CESAREAN SECTION     all three pregnancies    OB History    Gravida  3   Para  3   Term  0   Preterm      AB      Living  3     SAB      TAB      Ectopic      Multiple      Live Births               Home Medications    Prior to Admission  medications   Medication Sig Start Date End Date Taking? Authorizing Provider  acetaminophen-codeine (TYLENOL #3) 300-30 MG tablet Take 1 tablet by mouth every 12 (twelve) hours as needed for moderate pain. 03/14/20   Mayers, Cari S, PA-C  busPIRone (BUSPAR) 15 MG tablet Take 1 tablet (15 mg total) by mouth daily. 08/25/19   Charlott Rakes, MD  cariprazine (VRAYLAR) capsule Take by mouth daily.    [provider]  clindamycin (CLEOCIN) 300 MG capsule Take 1 capsule (300 mg total) by mouth 3 (three) times daily for 7 days. 04/22/20 04/29/20  Leeya Rusconi C, PA-C  cloNIDine (CATAPRES) 0.2 MG tablet Take 1 tablet (0.2 mg total) by mouth at bedtime. 03/14/20   Mayers, Cari S, PA-C  clotrimazole (LOTRIMIN) 1 % cream Apply 1 application topically 2 (two) times daily. 09/13/17   Charlott Rakes, MD  cyclobenzaprine (FLEXERIL) 10 MG tablet Take 1 tablet (10 mg total) by mouth 2 (two) times daily as needed for muscle spasms. 08/06/19   Domenic Moras, PA-C  DULoxetine (CYMBALTA) 60 MG capsule Take 1 capsule (60 mg total) by mouth  daily. 03/14/20   Mayers, Cari S, PA-C  Fluticasone-Salmeterol (ADVAIR DISKUS) 500-50 MCG/DOSE AEPB Inhale 1 puff into the lungs 2 (two) times daily. 06/17/19   Charlott Rakes, MD  furosemide (LASIX) 20 MG tablet Take 1 tablet (20 mg total) by mouth daily as needed. 03/14/20   Mayers, Cari S, PA-C  gabapentin (NEURONTIN) 300 MG capsule Take 2 capsules (600 mg total) by mouth 2 (two) times daily. 03/14/20   Mayers, Cari S, PA-C  HYDROcodone-acetaminophen (NORCO/VICODIN) 5-325 MG tablet Take 1-2 tablets by mouth every 6 (six) hours as needed for severe pain. 04/22/20   Chucky Homes C, PA-C  hydrOXYzine (ATARAX/VISTARIL) 10 MG tablet Take 1 tablet (10 mg total) by mouth 3 (three) times daily as needed. 03/14/20   Mayers, Cari S, PA-C  ibuprofen (ADVIL) 800 MG tablet Take 1 tablet (800 mg total) by mouth 3 (three) times daily. 04/22/20   Kaylynne Andres C, PA-C  lactulose (CHRONULAC) 10  GM/15ML solution Take 15 mLs (10 g total) by mouth 2 (two) times daily as needed for mild constipation. 06/17/19   Charlott Rakes, MD  levocetirizine (XYZAL) 5 MG tablet Take 1 tablet (5 mg total) by mouth every evening. 12/31/18   Charlott Rakes, MD  lidocaine (LIDODERM) 5 % Place 1 patch onto the skin daily. Remove & Discard patch within 12 hours or as directed by MD 12/31/18   Charlott Rakes, MD  meloxicam (MOBIC) 7.5 MG tablet Take 1 tablet (7.5 mg total) by mouth daily. 03/14/20   Mayers, Cari S, PA-C  sulfamethoxazole-trimethoprim (BACTRIM DS) 800-160 MG tablet Take 1 tablet by mouth 2 (two) times daily. 03/14/20   Mayers, Cari S, PA-C  traZODone (DESYREL) 50 MG tablet Take 1 tablet (50 mg total) by mouth at bedtime as needed. for sleep 03/14/20   Mayers, Cari S, PA-C  VENTOLIN HFA 108 (90 Base) MCG/ACT inhaler INHALE 2 PUFFS INTO THE LUNGS EVERY 6 (SIX) HOURS AS NEEDED FOR WHEEZING OR SHORTNESS OF BREATH. 03/15/20   Charlott Rakes, MD  nystatin (MYCOSTATIN) 100000 UNIT/ML suspension Take 5 mLs (500,000 Units total) by mouth 4 (four) times daily. Patient not taking: Reported on 06/17/2019 10/21/18 08/06/19  Kerin Perna, NP    Family History Family History  Problem Relation Age of Onset  . Cancer Mother        colon cancer  . Hypertension Mother   . Heart disease Father   . Cancer Father        cancer  . Other Neg Hx     Social History Social History   Tobacco Use  . Smoking status: Current Every Day Smoker    Packs/day: 0.25    Types: Cigarettes    Last attempt to quit: 02/14/2016    Years since quitting: 4.1  . Smokeless tobacco: Never Used  . Tobacco comment: 1 cig daily 09/13/17  Vaping Use  . Vaping Use: Never used  Substance Use Topics  . Alcohol use: No  . Drug use: No     Allergies   Bupropion hcl er (sr), Fruit & vegetable daily [nutritional supplements], Penicillins, Pollen extract-tree extract, and Other   Review of Systems Review of Systems    Constitutional: Negative for activity change, appetite change, chills, fatigue and fever.  HENT: Positive for dental problem and facial swelling. Negative for congestion, ear pain, rhinorrhea, sinus pressure, sore throat and trouble swallowing.   Eyes: Negative for discharge and redness.  Respiratory: Negative for cough, chest tightness and shortness of breath.  Cardiovascular: Negative for chest pain.  Gastrointestinal: Negative for abdominal pain, diarrhea, nausea and vomiting.  Musculoskeletal: Negative for myalgias.  Skin: Negative for rash.  Neurological: Negative for dizziness, light-headedness and headaches.     Physical Exam Triage Vital Signs ED Triage Vitals  Enc Vitals Group     BP 04/22/20 1156 (!) 151/98     Pulse Rate 04/22/20 1156 92     Resp 04/22/20 1156 20     Temp 04/22/20 1156 99.8 F (37.7 C)     Temp Source 04/22/20 1156 Oral     SpO2 04/22/20 1156 97 %     Weight --      Height --      Head Circumference --      Peak Flow --      Pain Score 04/22/20 1154 10     Pain Loc --      Pain Edu? --      Excl. in [place]? --    No data found.  Updated Vital Signs BP (!) 151/98 (BP Location: Right Arm)   Pulse 92   Temp 99.8 F (37.7 C) (Oral)   Resp 20   SpO2 97%   Visual Acuity Right Eye Distance:   Left Eye Distance:   Bilateral Distance:    Right Eye Near:   Left Eye Near:    Bilateral Near:     Physical Exam Vitals and nursing note reviewed.  Constitutional:      Appearance: She is well-developed.     Comments: No acute distress  HENT:     Head: Normocephalic and atraumatic.     Comments: Moderate swelling noted to left lower drawl extending slightly below submandibular line, tender to palpation    Ears:     Comments: Bilateral ears without tenderness to palpation of external auricle, tragus and mastoid, EAC's without erythema or swelling, TM's with good bony landmarks and cone of light. Non erythematous.     Nose: Nose normal.      Mouth/Throat:     Comments: Oral mucosa pink and moist, no tonsillar enlargement or exudate. Posterior pharynx patent and nonerythematous, no uvula deviation or swelling. Normal phonation. Poor dentition, gingival swelling and erythema noted to anterior lateral aspect of left lower jaw, no soft palate swelling, no swelling or tenderness below tongue Posterior pharynx patent Eyes:     Conjunctiva/sclera: Conjunctivae normal.  Neck:     Comments: Full active range of motion of neck Cardiovascular:     Rate and Rhythm: Normal rate.  Pulmonary:     Effort: Pulmonary effort is normal. No respiratory distress.  Abdominal:     General: There is no distension.  Musculoskeletal:        General: Normal range of motion.     Cervical back: Neck supple.  Skin:    General: Skin is warm and dry.  Neurological:     Mental Status: She is alert and oriented to person, place, and time.      UC Treatments / Results  Labs (all labs ordered are listed, but only abnormal results are displayed) Labs Reviewed - No data to display  EKG   Radiology No results found.  Procedures Procedures (including critical care time)  Medications Ordered in UC Medications  cefTRIAXone (ROCEPHIN) injection 1 g (has no administration in time range)    Initial Impression / Assessment and Plan / UC Course  I have reviewed the triage vital signs and the nursing notes.  Pertinent labs & imaging  results that were available during my care of the patient were reviewed by me and considered in my medical decision making (see chart for details).     Treating for dental abscess, given acute onset and with fever providing 1 g Rocephin prior to discharge.  Has penicillin allergy causing rash/hives, denies any airway troubles with prior penicillins.  We will proceed with Rocephin.  Sending home with clindamycin with close monitoring.  Do not suspect Ludwig's angina or deep space infection at this time, but advised patient  if not seeing any improvement with provided medicines within 48 hours or symptoms worsening to follow-up with emergency room for further evaluation and treatment.  Discussed strict return precautions. Patient verbalized understanding and is agreeable with plan.  Final Clinical Impressions(s) / UC Diagnoses   Final diagnoses:  Dental abscess     Discharge Instructions     Please use dental resource to contact offices to seek permenant treatment/relief.   Begin clindamycin every 8 hours for 1 week  For pain please take 600mg -800mg  of Ibuprofen every 8 hours, take with 1000 mg of Tylenol Extra strength every 8 hours. These are safe to take together. Please take with food.   I have also provided 2 days worth of stronger pain medication. This should only be used for severe pain. Do not drive or operate machinery while taking this medication.   Please return if you start to experience significant swelling of your face, experiencing fever, no improvement in 48 hours    ED Prescriptions    Medication Sig Dispense Auth. Provider   clindamycin (CLEOCIN) 300 MG capsule Take 1 capsule (300 mg total) by mouth 3 (three) times daily for 7 days. 21 capsule Shunna Mikaelian C, PA-C   ibuprofen (ADVIL) 800 MG tablet Take 1 tablet (800 mg total) by mouth 3 (three) times daily. 21 tablet Landyn Buckalew C, PA-C   HYDROcodone-acetaminophen (NORCO/VICODIN) 5-325 MG tablet Take 1-2 tablets by mouth every 6 (six) hours as needed for severe pain. 10 tablet Nickalaus Crooke, [place] C, PA-C     I have reviewed the PDMP during this encounter.   Janith Lima, [place] 04/22/20 1219

## 2020-04-22 NOTE — ED Triage Notes (Signed)
Pt presents with dental pain x 1 day. Pt  Ha snot tried nay medication for the pain.

## 2020-05-13 ENCOUNTER — Other Ambulatory Visit: Payer: Self-pay | Admitting: Family Medicine

## 2020-05-13 DIAGNOSIS — M48062 Spinal stenosis, lumbar region with neurogenic claudication: Secondary | ICD-10-CM

## 2020-05-13 DIAGNOSIS — R232 Flushing: Secondary | ICD-10-CM

## 2020-05-13 DIAGNOSIS — J4530 Mild persistent asthma, uncomplicated: Secondary | ICD-10-CM

## 2020-05-13 MED ORDER — ALBUTEROL SULFATE HFA 108 (90 BASE) MCG/ACT IN AERS: INHALATION_SPRAY | RESPIRATORY_TRACT | 3 refills | Status: DC

## 2020-05-13 MED ORDER — FLUTICASONE-SALMETEROL 500-50 MCG/DOSE IN AEPB: 1.0000 | INHALATION_SPRAY | Freq: Two times a day (BID) | RESPIRATORY_TRACT | 3 refills | Status: DC

## 2020-05-13 MED FILL — LACTULOSE 10 GM/15 ML SOLN: 10 | 31 days supply | Qty: 946 | Fill #1

## 2020-05-13 MED FILL — ACETAMINOPHEN-COD #3 TABLET: 300-30 | 30 days supply | Qty: 60 | Fill #1

## 2020-05-13 NOTE — Telephone Encounter (Signed)
Requested medication (s) are due for refill today: Yes  Requested medication (s) are on the active medication list: Yes  Last refill:  2 months ago  Future visit scheduled: Yes  Notes to clinic:  Unable to refill, last refilled by another provider, cannot delegate     Requested Prescriptions  Pending Prescriptions Disp Refills   cloNIDine (CATAPRES) 0.2 MG tablet 30 tablet 2    Sig: Take 1 tablet (0.2 mg total) by mouth at bedtime.      Cardiovascular:  Alpha-2 Agonists Failed - 05/13/2020  4:30 PM      Failed - Last BP in normal range    BP Readings from Last 1 Encounters:  04/22/20 (!) 151/98          Passed - Last Heart Rate in normal range    Pulse Readings from Last 1 Encounters:  04/22/20 92          Passed - Valid encounter within last 6 months    Recent Outpatient Visits           1 month ago Spinal stenosis, lumbar region, with neurogenic claudication   [place], [place] J, NP   5 months ago Spinal stenosis, lumbar region, with neurogenic claudication   [place], [place], MD   8 months ago Chronic pain of right knee   [place], Charlane Ferretti, MD   11 months ago Chronic pain of right knee   [place], Charlane Ferretti, MD   1 year ago Class 3 severe obesity due to excess calories with serious comorbidity and body mass index (BMI) of 40.0 to 44.9 in adult [facility])   [place], Charlane Ferretti, MD       Future Appointments             In 1 month Charlott Rakes, MD [place]              acetaminophen-codeine (TYLENOL #3) 300-30 MG tablet 60 tablet 1    Sig: Take 1 tablet by mouth every 12 (twelve) hours as needed for moderate pain.      Not Delegated - Analgesics:  Opioid Agonist Combinations Failed - 05/13/2020  4:30 PM      Failed - This refill  cannot be delegated      Failed - Urine Drug Screen completed in last 360 days.      Passed - Valid encounter within last 6 months    Recent Outpatient Visits           1 month ago Spinal stenosis, lumbar region, with neurogenic claudication   [place], [place] J, NP   5 months ago Spinal stenosis, lumbar region, with neurogenic claudication   [place], [place], MD   8 months ago Chronic pain of right knee   [place], Charlane Ferretti, MD   11 months ago Chronic pain of right knee   [place], Charlane Ferretti, MD   1 year ago Class 3 severe obesity due to excess calories with serious comorbidity and body mass index (BMI) of 40.0 to 44.9 in adult [facility])   [place], [place], MD       Future Appointments  In 1 month Charlott Rakes, MD [place]             Signed Prescriptions Disp Refills   Fluticasone-Salmeterol (ADVAIR DISKUS) 500-50 MCG/DOSE AEPB 180 each 3    Sig: Inhale 1 puff into the lungs 2 (two) times daily.      Pulmonology:  Combination Products Passed - 05/13/2020  4:30 PM      Passed - Valid encounter within last 12 months    Recent Outpatient Visits           1 month ago Spinal stenosis, lumbar region, with neurogenic claudication   [place], [place] J, NP   5 months ago Spinal stenosis, lumbar region, with neurogenic claudication   [place], [place], MD   8 months ago Chronic pain of right knee   [place], Charlane Ferretti, MD   11 months ago Chronic pain of right knee   [place], Charlane Ferretti, MD   1 year ago Class 3 severe obesity due to excess calories with serious comorbidity and body mass  index (BMI) of 40.0 to 44.9 in adult [facility])   [place], [place], MD       Future Appointments             In 1 month Charlott Rakes, MD [place]              albuterol (VENTOLIN HFA) 108 (90 Base) MCG/ACT inhaler 18 g 3    Sig: INHALE 2 PUFFS INTO THE LUNGS EVERY 6 (SIX) HOURS AS NEEDED FOR WHEEZING OR SHORTNESS OF BREATH.      Pulmonology:  Beta Agonists Failed - 05/13/2020  4:30 PM      Failed - One inhaler should last at least one month. If the patient is requesting refills earlier, contact the patient to check for uncontrolled symptoms.      Passed - Valid encounter within last 12 months    Recent Outpatient Visits           1 month ago Spinal stenosis, lumbar region, with neurogenic claudication   [place], [place] J, NP   5 months ago Spinal stenosis, lumbar region, with neurogenic claudication   [place], [place], MD   8 months ago Chronic pain of right knee   [place], Charlane Ferretti, MD   11 months ago Chronic pain of right knee   [place], Charlane Ferretti, MD   1 year ago Class 3 severe obesity due to excess calories with serious comorbidity and body mass index (BMI) of 40.0 to 44.9 in adult [facility])   [place], MD       Future Appointments             In 1 month Charlott Rakes, MD [place]

## 2020-05-13 NOTE — Telephone Encounter (Signed)
Medication: cloNIDine (CATAPRES) 0.2 MG tablet [[id number]] , Fluticasone-Salmeterol (ADVAIR DISKUS) 500-50 MCG/DOSE AEPB [[id number]] , VENTOLIN HFA 108 (90 Base) MCG/ACT inhaler [[id number]] , acetaminophen-codeine (TYLENOL #3) 300-30 MG tablet [[id number]]   Has the patient contacted their pharmacy? YES  (Agent: If no, request that the patient contact the pharmacy for the refill.) (Agent: If yes, when and what did the pharmacy advise?)  Preferred Pharmacy (with phone number or street name):[facility], [address]  Phone:  [phone] Fax:  [phone]     Agent: Please be advised that RX refills may take up to 3 business days. We ask that you follow-up with your pharmacy.

## 2020-05-16 ENCOUNTER — Other Ambulatory Visit: Payer: Self-pay | Admitting: Family Medicine

## 2020-05-16 DIAGNOSIS — M48062 Spinal stenosis, lumbar region with neurogenic claudication: Secondary | ICD-10-CM

## 2020-05-16 MED FILL — ADVAIR 500/50 DISKUS: 500-50 | 30 days supply | Qty: 60 | Fill #0

## 2020-05-16 MED FILL — ALBUTEROL SULFATE HFA 108 (: 108 (90 BAS | 25 days supply | Qty: 18 | Fill #0

## 2020-05-16 MED FILL — cloNIDine HCL 0.2 MG TABS: 0.2 | 30 days supply | Qty: 30 | Fill #1

## 2020-05-17 ENCOUNTER — Other Ambulatory Visit: Payer: Self-pay | Admitting: Family Medicine

## 2020-05-17 MED ORDER — CLONIDINE HCL 0.2 MG PO TABS: 0.2000 mg | ORAL_TABLET | Freq: Every day | ORAL | 2 refills | Status: DC

## 2020-05-18 MED ORDER — ACETAMINOPHEN-CODEINE #3 300-30 MG PO TABS: 1.0000 | ORAL_TABLET | Freq: Two times a day (BID) | ORAL | 1 refills | Status: DC | PRN

## 2020-05-30 ENCOUNTER — Telehealth: Payer: Self-pay

## 2020-05-30 NOTE — Telephone Encounter (Signed)
LIDODERM PATCHES PA DENIED

## 2020-06-17 MED FILL — busPIRone HCL 15 MG TABS: 15 | 30 days supply | Qty: 30 | Fill #1

## 2020-06-17 MED FILL — ALBUTEROL SULFATE HFA 108 (: 108 (90 BAS | 25 days supply | Qty: 18 | Fill #1

## 2020-06-17 MED FILL — ACETAMINOPHEN-COD #3 TABLET: 300-30 | 30 days supply | Qty: 60 | Fill #0

## 2020-06-17 MED FILL — cloNIDine HCL 0.2 MG TABS: 0.2 | 30 days supply | Qty: 30 | Fill #0

## 2020-06-17 MED FILL — MELOXICAM 7.5 MG TABLET: 7.5 | 30 days supply | Qty: 30 | Fill #1

## 2020-06-21 ENCOUNTER — Other Ambulatory Visit: Payer: Self-pay | Admitting: Family Medicine

## 2020-06-21 DIAGNOSIS — F419 Anxiety disorder, unspecified: Secondary | ICD-10-CM

## 2020-06-21 NOTE — Telephone Encounter (Signed)
Requested medication (s) are due for refill today: yes  Requested medication (s) are on the active medication list: yes  Last refill:  03/14/20  Future visit scheduled: yes  Notes to clinic:  historical provider    Requested Prescriptions  Pending Prescriptions Disp Refills   hydrOXYzine (ATARAX/VISTARIL) 10 MG tablet [Pharmacy Med Name: hydrOXYzine HCL 10 MG TABS 10 Tablet] 30 tablet 0    Sig: Take 1 tablet (10 mg total) by mouth 3 (three) times daily as needed.      There is no refill protocol information for this order

## 2020-06-22 NOTE — Telephone Encounter (Signed)
Please refill if indicated! 

## 2020-06-24 MED FILL — hydrOXYzine HCL 10 MG TABS: 10 | 10 days supply | Qty: 30 | Fill #0

## 2020-07-05 ENCOUNTER — Other Ambulatory Visit: Payer: Self-pay

## 2020-07-05 ENCOUNTER — Ambulatory Visit: Payer: 59 | Attending: Family Medicine | Admitting: Family Medicine

## 2020-07-05 ENCOUNTER — Encounter: Payer: Self-pay | Admitting: Family Medicine

## 2020-07-05 ENCOUNTER — Other Ambulatory Visit: Payer: Self-pay | Admitting: Family Medicine

## 2020-07-05 VITALS — BP 114/78 | HR 77 | Ht 61.0 in | Wt 201.4 lb

## 2020-07-05 DIAGNOSIS — K5909 Other constipation: Secondary | ICD-10-CM

## 2020-07-05 DIAGNOSIS — F419 Anxiety disorder, unspecified: Secondary | ICD-10-CM

## 2020-07-05 DIAGNOSIS — Z13228 Encounter for screening for other metabolic disorders: Secondary | ICD-10-CM

## 2020-07-05 DIAGNOSIS — M48062 Spinal stenosis, lumbar region with neurogenic claudication: Secondary | ICD-10-CM

## 2020-07-05 DIAGNOSIS — J4530 Mild persistent asthma, uncomplicated: Secondary | ICD-10-CM

## 2020-07-05 DIAGNOSIS — Z1231 Encounter for screening mammogram for malignant neoplasm of breast: Secondary | ICD-10-CM

## 2020-07-05 DIAGNOSIS — K047 Periapical abscess without sinus: Secondary | ICD-10-CM | POA: Diagnosis not present

## 2020-07-05 DIAGNOSIS — G4709 Other insomnia: Secondary | ICD-10-CM | POA: Diagnosis not present

## 2020-07-05 DIAGNOSIS — R600 Localized edema: Secondary | ICD-10-CM

## 2020-07-05 DIAGNOSIS — F32A Depression, unspecified: Secondary | ICD-10-CM

## 2020-07-05 MED ORDER — CLINDAMYCIN HCL 300 MG PO CAPS: 300.0000 mg | ORAL_CAPSULE | Freq: Three times a day (TID) | ORAL | 0 refills | Status: DC

## 2020-07-05 MED ORDER — TRAZODONE HCL 50 MG PO TABS: 50.0000 mg | ORAL_TABLET | Freq: Every evening | ORAL | 6 refills | Status: DC | PRN

## 2020-07-05 MED ORDER — GABAPENTIN 300 MG PO CAPS: 600.0000 mg | ORAL_CAPSULE | Freq: Two times a day (BID) | ORAL | 6 refills | Status: DC

## 2020-07-05 MED ORDER — LACTULOSE 10 GM/15ML PO SOLN: 10.0000 g | Freq: Two times a day (BID) | ORAL | 1 refills | Status: DC | PRN

## 2020-07-05 MED ORDER — DULOXETINE HCL 60 MG PO CPEP: 60.0000 mg | ORAL_CAPSULE | Freq: Every day | ORAL | 6 refills | Status: DC

## 2020-07-05 MED ORDER — MISC. DEVICES MISC: 0 refills | Status: AC

## 2020-07-05 MED ORDER — ALBUTEROL SULFATE HFA 108 (90 BASE) MCG/ACT IN AERS: INHALATION_SPRAY | RESPIRATORY_TRACT | 3 refills | Status: DC

## 2020-07-05 MED ORDER — ACETAMINOPHEN-CODEINE #3 300-30 MG PO TABS: 1.0000 | ORAL_TABLET | Freq: Two times a day (BID) | ORAL | 2 refills | Status: DC | PRN

## 2020-07-05 MED ORDER — FUROSEMIDE 20 MG PO TABS: 20.0000 mg | ORAL_TABLET | Freq: Every day | ORAL | 2 refills | Status: DC | PRN

## 2020-07-05 MED FILL — CLINDAMYCIN HCL 300 MG CAPS: 300 | 6 days supply | Qty: 20 | Fill #0

## 2020-07-05 MED FILL — ALBUTEROL SULFATE HFA 108 (: 108 (90 BAS | 25 days supply | Qty: 18 | Fill #0

## 2020-07-05 NOTE — Progress Notes (Signed)
Having pain in back and right leg  Having dental pain

## 2020-07-05 NOTE — Progress Notes (Signed)
Subjective:  Patient ID: [name], female    DOB: 09-12-1971  Age: 49 y.o. MRN: [id number]  CC: Back Pain   HPI [name]  is a 49 year old female with a history of asthma, hypertension, depression, chronic low back pain secondary to spinal stenosis, degenerative disc disease of the thoracic spine, cervical stenosis, hot flashes, and chronic constipation who presents today for a follow-up visit. Her back pain is worse and radiating anteriorly to her R anterior thigh. Pain is 8/10 and she has been falling despite using a cane. Pain is sharp and she has no numbness Scheduled to see a Spine specialist likely for SSI disability.  Currently on Tylenol 3 for chronic pain which she uses on a regular basis.    She sees Psych at [facility] and her scores for GAD 7 is 21 and PHQ 9 is 21.  She attributes these scores to current psychosocial stressors and her chronic pain but denies suicidal ideations or intent . Her asthma is stable and she is doing well on trazodone for insomnia.  She complains of a partially broken tooth which is painful with associated gum swelling.  She has needed antibiotics on a recurrent basis for this as she had no insurance previously to see a dentist.  Last received antibiotics in 04/2020 when she was seen at urgent care.  Past Medical History:  Diagnosis Date  . Allergy    pollen, pcn  . Asthma   . Depression   . Fibroids   . History of ovarian cyst   . Medical history non-contributory    sickle cell  . Menometrorrhagia   . 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 Sickle cell trait [facility])     Past Surgical History:  Procedure Laterality Date  . CESAREAN SECTION     all three pregnancies    Family History  Problem Relation Age of Onset  . Cancer Mother        colon cancer  . Hypertension Mother   . Heart disease Father   . Cancer Father        cancer  . Other Neg Hx     Allergies  Allergen Reactions  . Bupropion Hcl Er (Sr) Hives and  Other (See Comments)    The patient has an entire page of reactions that can be caused by the medication, but none that she has suffered.  . Fruit & Vegetable Daily [Nutritional Supplements] Other (See Comments)    "all fruits causes bumps and weird feeling in her mouth"  . Penicillins Hives  . Pollen Extract-Tree Extract Other (See Comments)    Allergies  . Other Rash    "bugs" roaches    Outpatient Medications Prior to Visit  Medication Sig Dispense Refill  . busPIRone (BUSPAR) 15 MG tablet Take 1 tablet (15 mg total) by mouth daily. 30 tablet 2  . cloNIDine (CATAPRES) 0.2 MG tablet Take 1 tablet (0.2 mg total) by mouth at bedtime. 30 tablet 2  . clotrimazole (LOTRIMIN) 1 % cream Apply 1 application topically 2 (two) times daily. 45 g 1  . Fluticasone-Salmeterol (ADVAIR DISKUS) 500-50 MCG/DOSE AEPB Inhale 1 puff into the lungs 2 (two) times daily. 180 each 3  . hydrOXYzine (ATARAX/VISTARIL) 10 MG tablet TAKE 1 TABLET (10 MG TOTAL) BY MOUTH 3 (THREE) TIMES DAILY AS NEEDED. 30 tablet 0  . ibuprofen (ADVIL) 800 MG tablet Take 1 tablet (800 mg total) by mouth 3 (three) times daily. 21  tablet 0  . levocetirizine (XYZAL) 5 MG tablet Take 1 tablet (5 mg total) by mouth every evening. 30 tablet 2  . lidocaine (LIDODERM) 5 % PLACE 1 PATCH ONTO THE SKIN DAILY. REMOVE & DISCARD PATCH WITHIN 12 HOURS OR AS DIRECTED BY MD 30 patch 3  . meloxicam (MOBIC) 7.5 MG tablet Take 1 tablet (7.5 mg total) by mouth daily. 30 tablet 3  . acetaminophen-codeine (TYLENOL #3) 300-30 MG tablet Take 1 tablet by mouth every 12 (twelve) hours as needed for moderate pain. 60 tablet 1  . albuterol (VENTOLIN HFA) 108 (90 Base) MCG/ACT inhaler INHALE 2 PUFFS INTO THE LUNGS EVERY 6 (SIX) HOURS AS NEEDED FOR WHEEZING OR SHORTNESS OF BREATH. 18 g 3  . cyclobenzaprine (FLEXERIL) 10 MG tablet Take 1 tablet (10 mg total) by mouth 2 (two) times daily as needed for muscle spasms. 20 tablet 0  . DULoxetine (CYMBALTA) 60 MG capsule  Take 1 capsule (60 mg total) by mouth daily. 30 capsule 3  . furosemide (LASIX) 20 MG tablet Take 1 tablet (20 mg total) by mouth daily as needed. 30 tablet 2  . gabapentin (NEURONTIN) 300 MG capsule Take 2 capsules (600 mg total) by mouth 2 (two) times daily. 120 capsule 3  . HYDROcodone-acetaminophen (NORCO/VICODIN) 5-325 MG tablet Take 2 tablets by mouth every 4 (four) hours as needed. 10 tablet 0  . lactulose (CHRONULAC) 10 GM/15ML solution Take 15 mLs (10 g total) by mouth 2 (two) times daily as needed for mild constipation. 946 mL 1  . traZODone (DESYREL) 50 MG tablet Take 1 tablet (50 mg total) by mouth at bedtime as needed. for sleep 30 tablet 2  . cariprazine (VRAYLAR) capsule Take by mouth daily. (Patient not taking: Reported on 07/05/2020)    . sulfamethoxazole-trimethoprim (BACTRIM DS) 800-160 MG tablet Take 1 tablet by mouth 2 (two) times daily. (Patient not taking: Reported on 07/05/2020) 10 tablet 0   No facility-administered medications prior to visit.     ROS Review of Systems  Constitutional: Negative for activity change, appetite change and fatigue.  HENT: Negative for congestion, sinus pressure and sore throat.   Eyes: Negative for visual disturbance.  Respiratory: Negative for cough, chest tightness, shortness of breath and wheezing.   Cardiovascular: Negative for chest pain and palpitations.  Gastrointestinal: Negative for abdominal distention, abdominal pain and constipation.  Endocrine: Negative for polydipsia.  Genitourinary: Negative for dysuria and frequency.  Musculoskeletal: Positive for back pain. Negative for arthralgias.  Skin: Negative for rash.  Neurological: Negative for tremors, light-headedness and numbness.  Hematological: Does not bruise/bleed easily.  Psychiatric/Behavioral: Negative for agitation and behavioral problems.    Objective:  BP 114/78   Pulse 77   Ht 5\' 1"  (1.549 m)   Wt 201 lb 6.4 oz (91.4 kg)   SpO2 97%   BMI 38.05 kg/m    BP/Weight 07/05/2020 04/22/2020 05/19/4096  Systolic BP 353 299 242  Diastolic BP 78 98 85  Wt. (Lbs) 201.4 - 208  BMI 38.05 - 39.3  Some encounter information is confidential and restricted. Go to Review Flowsheets activity to see all data.      Physical Exam Constitutional:      Appearance: She is well-developed.  Neck:     Vascular: No JVD.  Cardiovascular:     Rate and Rhythm: Normal rate.     Heart sounds: Normal heart sounds. No murmur heard.   Pulmonary:     Effort: Pulmonary effort is normal.  Breath sounds: Normal breath sounds. No wheezing or rales.  Chest:     Chest wall: No tenderness.  Abdominal:     General: Bowel sounds are normal. There is no distension.     Palpations: Abdomen is soft. There is no mass.     Tenderness: There is no abdominal tenderness.  Musculoskeletal:        General: Tenderness (lumbar spine TTP) present.     Right lower leg: No edema.     Left lower leg: No edema.     Comments: Positive straight leg raise bilaterally  Neurological:     Mental Status: She is alert and oriented to person, place, and time.  Psychiatric:        Mood and Affect: Mood normal.     CMP Latest Ref Rng & Units 12/09/2019 06/17/2019 01/02/2019  Glucose 65 - 99 mg/dL 117(H) 84 99  BUN 6 - 24 mg/dL 9 4(L) 7  Creatinine 0.57 - 1.00 mg/dL 1.00 0.72 0.89  Sodium 134 - 144 mmol/L 143 146(H) 144  Potassium 3.5 - 5.2 mmol/L 3.7 3.5 4.2  Chloride 96 - 106 mmol/L 104 102 104  CO2 20 - 29 mmol/L 25 29 24   Calcium 8.7 - 10.2 mg/dL 9.1 9.3 8.8  Total Protein 6.0 - 8.5 g/dL - - 6.1  Total Bilirubin 0.0 - 1.2 mg/dL - - <0.2  Alkaline Phos 39 - 117 IU/L - - 109  AST 0 - 40 IU/L - - 15  ALT 0 - 32 IU/L - - 11    Lipid Panel     Component Value Date/Time   CHOL 159 01/02/2019 0925   TRIG 137 01/02/2019 0925   HDL 42 01/02/2019 0925   CHOLHDL 3.8 01/02/2019 0925   CHOLHDL 3.0 05/15/2016 0949   VLDL 13 05/15/2016 0949   LDLCALC 90 01/02/2019 0925    CBC     Component Value Date/Time   WBC 7.2 04/18/2015 1136   RBC 4.44 04/18/2015 1136   HGB 12.6 08/02/2016 1806   HCT 37.0 08/02/2016 1806   PLT 314 04/18/2015 1136   MCV 78.6 04/18/2015 1136   MCH 27.7 04/18/2015 1136   MCHC 35.2 04/18/2015 1136   RDW 13.4 04/18/2015 1136   LYMPHSABS 1.6 06/26/2010 2057   MONOABS 0.4 06/26/2010 2057   EOSABS 0.1 06/26/2010 2057   BASOSABS 0.0 06/26/2010 2057    Lab Results  Component Value Date   HGBA1C 5.3 (A) 03/11/2019    Assessment & Plan:  1. Other insomnia Controlled - traZODone (DESYREL) 50 MG tablet; Take 1 tablet (50 mg total) by mouth at bedtime as needed. for sleep  Dispense: 30 tablet; Refill: 6  2. Spinal stenosis, lumbar region, with neurogenic claudication Uncontrolled We have discussed referral to a spine surgeon for possible epidural spinal injections and referral to rehab however she is not interested at this time.  States she has a lot going on including her daughter who is about to be kicked out of her house and would like to hold off. - Misc. Devices MISC; 4-pronged cane.  Diagnosis-spinal stenosis  Dispense: 1 each; Refill: 0 - gabapentin (NEURONTIN) 300 MG capsule; Take 2 capsules (600 mg total) by mouth 2 (two) times daily.  Dispense: 120 capsule; Refill: 6 - acetaminophen-codeine (TYLENOL #3) 300-30 MG tablet; Take 1 tablet by mouth every 12 (twelve) hours as needed for moderate pain.  Dispense: 60 tablet; Refill: 2  3. Other constipation Opioid induced Controlled on lactulose - lactulose (CHRONULAC) 10  GM/15ML solution; Take 15 mLs (10 g total) by mouth 2 (two) times daily as needed for mild constipation.  Dispense: 946 mL; Refill: 1  4. Pedal edema Dependent Elevate feet, low-sodium diet Use compression stockings - furosemide (LASIX) 20 MG tablet; Take 1 tablet (20 mg total) by mouth daily as needed.  Dispense: 30 tablet; Refill: 2  5. Anxiety and depression Uncontrolled secondary to underlying psychosocial  stressors Also on Vraylar prescribed by psychiatry She will need to schedule an early appointment with her psychiatrist - DULoxetine (CYMBALTA) 60 MG capsule; Take 1 capsule (60 mg total) by mouth daily.  Dispense: 30 capsule; Refill: 6  6. Mild persistent asthma without complication Controlled - albuterol (VENTOLIN HFA) 108 (90 Base) MCG/ACT inhaler; INHALE 2 PUFFS INTO THE LUNGS EVERY 6 (SIX) HOURS AS NEEDED FOR WHEEZING OR SHORTNESS OF BREATH.  Dispense: 18 g; Refill: 3  7. Dental abscess Needs to see dentist for tooth extraction She has been referred - Ambulatory referral to Dentistry - clindamycin (CLEOCIN) 300 MG capsule; Take 1 capsule (300 mg total) by mouth 3 (three) times daily.  Dispense: 20 capsule; Refill: 0  8. Screening for metabolic disorder - Lipid panel - Basic Metabolic Panel  9. Encounter for screening mammogram for malignant neoplasm of breast - MM 3D SCREEN BREAST BILATERAL; Future    Meds ordered this encounter  Medications  . Misc. Devices MISC    Sig: 4-pronged cane.  Diagnosis-spinal stenosis    Dispense:  1 each    Refill:  0  . clindamycin (CLEOCIN) 300 MG capsule    Sig: Take 1 capsule (300 mg total) by mouth 3 (three) times daily.    Dispense:  20 capsule    Refill:  0  . traZODone (DESYREL) 50 MG tablet    Sig: Take 1 tablet (50 mg total) by mouth at bedtime as needed. for sleep    Dispense:  30 tablet    Refill:  6  . lactulose (CHRONULAC) 10 GM/15ML solution    Sig: Take 15 mLs (10 g total) by mouth 2 (two) times daily as needed for mild constipation.    Dispense:  946 mL    Refill:  1  . gabapentin (NEURONTIN) 300 MG capsule    Sig: Take 2 capsules (600 mg total) by mouth 2 (two) times daily.    Dispense:  120 capsule    Refill:  6  . furosemide (LASIX) 20 MG tablet    Sig: Take 1 tablet (20 mg total) by mouth daily as needed.    Dispense:  30 tablet    Refill:  2  . DULoxetine (CYMBALTA) 60 MG capsule    Sig: Take 1 capsule (60 mg  total) by mouth daily.    Dispense:  30 capsule    Refill:  6  . albuterol (VENTOLIN HFA) 108 (90 Base) MCG/ACT inhaler    Sig: INHALE 2 PUFFS INTO THE LUNGS EVERY 6 (SIX) HOURS AS NEEDED FOR WHEEZING OR SHORTNESS OF BREATH.    Dispense:  18 g    Refill:  3  . acetaminophen-codeine (TYLENOL #3) 300-30 MG tablet    Sig: Take 1 tablet by mouth every 12 (twelve) hours as needed for moderate pain.    Dispense:  60 tablet    Refill:  2    Diagnosis spinal stenosis    Follow-up: Return in about 3 months (around 10/05/2020) for Chronic disease management.       Charlott Rakes, MD, FAAFP. [facility] [place], [place]   07/05/2020, 12:42 PM

## 2020-07-05 NOTE — Patient Instructions (Signed)

## 2020-07-06 LAB — BASIC METABOLIC PANEL
BUN/Creatinine Ratio: 8 — ABNORMAL LOW (ref 9–23)
BUN: 7 mg/dL (ref 6–24)
CO2: 24 mmol/L (ref 20–29)
Calcium: 9.3 mg/dL (ref 8.7–10.2)
Chloride: 102 mmol/L (ref 96–106)
Creatinine, Ser: 0.89 mg/dL (ref 0.57–1.00)
GFR calc Af Amer: 87 mL/min/{1.73_m2} (ref >59)
GFR calc non Af Amer: 76 mL/min/{1.73_m2} (ref >59)
Glucose: 89 mg/dL (ref 65–99)
Potassium: 4.1 mmol/L (ref 3.5–5.2)
Sodium: 141 mmol/L (ref 134–144)

## 2020-07-06 LAB — LIPID PANEL
Chol/HDL Ratio: 5.2 ratio — ABNORMAL HIGH (ref 0.0–4.4)
Cholesterol, Total: 192 mg/dL (ref 100–199)
HDL: 37 mg/dL — ABNORMAL LOW (ref >39)
LDL Chol Calc (NIH): 134 mg/dL — ABNORMAL HIGH (ref 0–99)
Triglycerides: 113 mg/dL (ref 0–149)
VLDL Cholesterol Cal: 21 mg/dL (ref 5–40)

## 2020-07-08 ENCOUNTER — Telehealth: Payer: Self-pay

## 2020-07-08 NOTE — Telephone Encounter (Signed)
-----   Message from Charlott Rakes, MD sent at 07/06/2020  1:31 PM EDT ----- Cholesterol is elevated.  Due to her low risk of cardiovascular disease we do not have to start medications at this time.  Please advised to comply with a low-cholesterol diet and exercise.

## 2020-07-08 NOTE — Telephone Encounter (Signed)
Patient name and DOB has been verified Patient was informed of lab results. Patient had no questions.  

## 2020-07-22 MED FILL — cloNIDine HCL 0.2 MG TABS: 0.2 | 30 days supply | Qty: 30 | Fill #1

## 2020-07-22 MED FILL — DULoxetine HCL 60 MG CPEP: 60 | 30 days supply | Qty: 30 | Fill #0

## 2020-07-22 MED FILL — hydrOXYzine HCL 10 MG TABS: 10 | 10 days supply | Qty: 30 | Fill #0

## 2020-07-22 MED FILL — ACETAMINOPHEN-COD #3 TABLET: 300-30 | 30 days supply | Qty: 60 | Fill #1

## 2020-07-22 MED FILL — ALBUTEROL SULFATE HFA 108 (: 108 (90 BAS | 25 days supply | Qty: 18 | Fill #0

## 2020-07-22 MED FILL — CLINDAMYCIN HCL 300 MG CAPS: 300 | 6 days supply | Qty: 20 | Fill #0

## 2020-07-25 MED FILL — ALBUTEROL SULFATE HFA 108 (: 108 (90 BAS | 25 days supply | Qty: 18 | Fill #0

## 2020-07-25 MED FILL — DULoxetine HCL 60 MG CPEP: 60 | 30 days supply | Qty: 30 | Fill #0

## 2020-07-25 MED FILL — hydrOXYzine HCL 10 MG TABS: 10 | 10 days supply | Qty: 30 | Fill #0

## 2020-07-25 MED FILL — CLINDAMYCIN HCL 300 MG CAPS: 300 | 6 days supply | Qty: 20 | Fill #0

## 2020-08-23 ENCOUNTER — Other Ambulatory Visit: Payer: Self-pay | Admitting: Family Medicine

## 2020-08-23 DIAGNOSIS — F32A Depression, unspecified: Secondary | ICD-10-CM

## 2020-08-23 DIAGNOSIS — F419 Anxiety disorder, unspecified: Secondary | ICD-10-CM

## 2020-08-23 MED FILL — cloNIDine HCL 0.2 MG TABS: 0.2 | 30 days supply | Qty: 30 | Fill #2

## 2020-08-23 MED FILL — MELOXICAM 7.5 MG TABLET: 7.5 | 30 days supply | Qty: 30 | Fill #2

## 2020-08-23 MED FILL — ACETAMINOPHEN-COD #3 TABLET: 300-30 | 30 days supply | Qty: 60 | Fill #0

## 2020-08-23 MED FILL — DULoxetine HCL 60 MG CPEP: 60 | 30 days supply | Qty: 30 | Fill #1

## 2020-08-23 MED FILL — hydrOXYzine HCL 10 MG TABS: 10 | 10 days supply | Qty: 30 | Fill #0

## 2020-08-23 MED FILL — busPIRone HCL 15 MG TABS: 15 | 30 days supply | Qty: 30 | Fill #2

## 2020-08-23 MED FILL — ALBUTEROL SULFATE HFA 108 (: 108 (90 BAS | 25 days supply | Qty: 18 | Fill #1

## 2020-08-23 MED FILL — FUROSEMIDE 20 MG TABS: 20 | 30 days supply | Qty: 30 | Fill #0

## 2020-09-20 ENCOUNTER — Other Ambulatory Visit: Payer: Self-pay | Admitting: Family Medicine

## 2020-09-20 DIAGNOSIS — F32A Depression, unspecified: Secondary | ICD-10-CM

## 2020-09-20 DIAGNOSIS — F419 Anxiety disorder, unspecified: Secondary | ICD-10-CM

## 2020-09-20 MED FILL — MELOXICAM 7.5 MG TABLET: 7.5 | 30 days supply | Qty: 30 | Fill #3

## 2020-09-20 MED FILL — ADVAIR 500/50 DISKUS: 500-50 | 30 days supply | Qty: 60 | Fill #0

## 2020-09-20 MED FILL — PROAIR HFA 90 MCG INHALER: 108 (90 BAS | 25 days supply | Qty: 9 | Fill #0

## 2020-09-20 MED FILL — ACETAMINOPHEN-COD #3 TABLET: 300-30 | 7 days supply | Qty: 14 | Fill #1

## 2020-09-20 MED FILL — hydrOXYzine HCL 10 MG TABS: 10 | 10 days supply | Qty: 30 | Fill #0

## 2020-09-20 MED FILL — cloNIDine HCL 0.2 MG TABS: 0.2 | 30 days supply | Qty: 30 | Fill #2

## 2020-09-20 MED FILL — DULoxetine HCL 60 MG CPEP: 60 | 30 days supply | Qty: 30 | Fill #2

## 2020-10-05 ENCOUNTER — Telehealth: Payer: 59 | Admitting: Family Medicine

## 2020-10-21 MED FILL — ACETAMINOPHEN-COD #3 TABLET: 300-30 | 7 days supply | Qty: 14 | Fill #2

## 2020-10-21 MED FILL — cloNIDine HCL 0.2 MG TABS: 0.2 | 30 days supply | Qty: 30 | Fill #0

## 2020-10-25 ENCOUNTER — Telehealth: Payer: Self-pay

## 2020-10-25 NOTE — Telephone Encounter (Signed)
Yes prior authorization would be in order.  What does the sig need to say to prevent the patient from getting 7 days at a time but rather a 1 month supply?  Thanks

## 2020-10-25 NOTE — Telephone Encounter (Signed)
Patient is requesting a prior auth for more than a 7-day supply of her Tylenol #3.  SIG reads that medication is 'as needed'.  Would it be appropriate to initiate a PA for this patient?

## 2020-10-25 NOTE — Telephone Encounter (Signed)
No sig change is needed.  I just needed to know it was approved for daily use for PA purposes.

## 2020-11-02 ENCOUNTER — Encounter: Payer: Self-pay | Admitting: Family Medicine

## 2020-11-02 ENCOUNTER — Other Ambulatory Visit: Payer: Self-pay | Admitting: Family Medicine

## 2020-11-02 ENCOUNTER — Ambulatory Visit: Payer: 59 | Attending: Family Medicine | Admitting: Family Medicine

## 2020-11-02 ENCOUNTER — Other Ambulatory Visit: Payer: Self-pay

## 2020-11-02 VITALS — BP 106/73 | HR 91 | Ht 61.0 in | Wt 193.0 lb

## 2020-11-02 DIAGNOSIS — Z79899 Other long term (current) drug therapy: Secondary | ICD-10-CM | POA: Insufficient documentation

## 2020-11-02 DIAGNOSIS — R232 Flushing: Secondary | ICD-10-CM | POA: Insufficient documentation

## 2020-11-02 DIAGNOSIS — J4530 Mild persistent asthma, uncomplicated: Secondary | ICD-10-CM | POA: Diagnosis not present

## 2020-11-02 DIAGNOSIS — M79671 Pain in right foot: Secondary | ICD-10-CM | POA: Insufficient documentation

## 2020-11-02 DIAGNOSIS — F32A Depression, unspecified: Secondary | ICD-10-CM | POA: Diagnosis not present

## 2020-11-02 DIAGNOSIS — F419 Anxiety disorder, unspecified: Secondary | ICD-10-CM | POA: Diagnosis not present

## 2020-11-02 DIAGNOSIS — N951 Menopausal and female climacteric states: Secondary | ICD-10-CM | POA: Diagnosis not present

## 2020-11-02 DIAGNOSIS — Z880 Allergy status to penicillin: Secondary | ICD-10-CM | POA: Diagnosis not present

## 2020-11-02 DIAGNOSIS — Z888 Allergy status to other drugs, medicaments and biological substances status: Secondary | ICD-10-CM | POA: Insufficient documentation

## 2020-11-02 DIAGNOSIS — M48062 Spinal stenosis, lumbar region with neurogenic claudication: Secondary | ICD-10-CM | POA: Diagnosis not present

## 2020-11-02 DIAGNOSIS — G4709 Other insomnia: Secondary | ICD-10-CM | POA: Insufficient documentation

## 2020-11-02 MED ORDER — MELOXICAM 7.5 MG PO TABS: 7.5000 mg | ORAL_TABLET | Freq: Every day | ORAL | 3 refills | Status: DC

## 2020-11-02 MED ORDER — ACETAMINOPHEN-CODEINE #3 300-30 MG PO TABS: 1.0000 | ORAL_TABLET | Freq: Two times a day (BID) | ORAL | 1 refills | Status: DC | PRN

## 2020-11-02 MED ORDER — DULOXETINE HCL 60 MG PO CPEP: 60.0000 mg | ORAL_CAPSULE | Freq: Every day | ORAL | 6 refills | Status: DC

## 2020-11-02 MED ORDER — PREDNISONE 20 MG PO TABS: 20.0000 mg | ORAL_TABLET | Freq: Every day | ORAL | 0 refills | Status: DC

## 2020-11-02 MED ORDER — CLONIDINE HCL 0.2 MG PO TABS: 0.2000 mg | ORAL_TABLET | Freq: Every day | ORAL | 2 refills | Status: DC

## 2020-11-02 MED ORDER — TRAZODONE HCL 50 MG PO TABS: 50.0000 mg | ORAL_TABLET | Freq: Every evening | ORAL | 6 refills | Status: DC | PRN

## 2020-11-02 MED ORDER — GABAPENTIN 300 MG PO CAPS: 600.0000 mg | ORAL_CAPSULE | Freq: Two times a day (BID) | ORAL | 6 refills | Status: DC

## 2020-11-02 MED ORDER — FLUTICASONE-SALMETEROL 500-50 MCG/DOSE IN AEPB: 1.0000 | INHALATION_SPRAY | Freq: Two times a day (BID) | RESPIRATORY_TRACT | 3 refills | Status: DC

## 2020-11-02 NOTE — Patient Instructions (Signed)
Radicular Pain Radicular pain is a type of pain that spreads from your back or neck along a spinal nerve. Spinal nerves are nerves that leave the spinal cord and go to the muscles. Radicular pain is sometimes called radiculopathy, radiculitis, or a pinched nerve. When you have this type of pain, you may also have weakness, numbness, or tingling in the area of your body that is supplied by the nerve. The pain may feel sharp and burning. Depending on which spinal nerve is affected, the pain may occur in the:  Neck area (cervical radicular pain). You may also feel pain, numbness, weakness, or tingling in the arms.  Mid-spine area (thoracic radicular pain). You would feel this pain in the back and chest. This type is rare.  Lower back area (lumbar radicular pain). You would feel this pain as low back pain. You may feel pain, numbness, weakness, or tingling in the buttocks or legs. Sciatica is a type of lumbar radicular pain that shoots down the back of the leg. Radicular pain occurs when one of the spinal nerves becomes irritated or squeezed (compressed). It is often caused by something pushing on a spinal nerve, such as one of the bones of the spine (vertebrae) or one of the round cushions between vertebrae (intervertebral disks). This can result from:  An injury.  Wear and tear or aging of a disk.  The growth of a bone spur that pushes on the nerve. Radicular pain often goes away when you follow instructions from your health care provider for relieving pain at home. Follow these instructions at home: Managing pain  If directed, put ice on the affected area: ? Put ice in a plastic bag. ? Place a towel between your skin and the bag. ? Leave the ice on for 20 minutes, 2-3 times a day.  If directed, apply heat to the affected area as often as told by your health care provider. Use the heat source that your health care provider recommends, such as a moist heat pack or a heating pad. ? Place a towel  between your skin and the heat source. ? Leave the heat on for 20-30 minutes. ? Remove the heat if your skin turns bright red. This is especially important if you are unable to feel pain, heat, or cold. You may have a greater risk of getting burned.      Activity  Do not sit or rest in bed for long periods of time.  Try to stay as active as possible. Ask your health care provider what type of exercise or activity is best for you.  Avoid activities that make your pain worse, such as bending and lifting.  Do not lift anything that is heavier than 10 lb (4.5 kg), or the limit that you are told, until your health care provider says that it is safe.  Practice using proper technique when lifting items. Proper lifting technique involves bending your knees and rising up.  Do strength and range-of-motion exercises only as told by your health care provider or physical therapist.   General instructions  Take over-the-counter and prescription medicines only as told by your health care provider.  Pay attention to any changes in your symptoms.  Keep all follow-up visits as told by your health care provider. This is important. ? Your health care provider may send you to a physical therapist to help with this pain. Contact a health care provider if:  Your pain and other symptoms get worse.  Your pain medicine   is not helping.  Your pain has not improved after a few weeks of home care.  You have a fever. Get help right away if:  You have severe pain, weakness, or numbness.  You have difficulty with bladder or bowel control. Summary  Radicular pain is a type of pain that spreads from your back or neck along a spinal nerve.  When you have radicular pain, you may also have weakness, numbness, or tingling in the area of your body that is supplied by the nerve.  The pain may feel sharp or burning.  Radicular pain may be treated with ice, heat, medicines, or physical therapy. This  information is not intended to replace advice given to you by your health care provider. Make sure you discuss any questions you have with your health care provider. Document Revised: 03/18/2018 Document Reviewed: 03/18/2018 Elsevier Patient Education  2021 [employer]  

## 2020-11-02 NOTE — Progress Notes (Signed)
Subjective:  Patient ID: [name], female    DOB: 09/10/1971  Age: 50 y.o. MRN: [id number]  CC: Foot Pain   HPI [name]  is a 50 year old female with a history of asthma, hypertension, depression, chronic low back pain secondary to spinal stenosis,degenerative disc disease of the thoracic spine, cervical stenosis, hot flashes,andchronic constipation who presents today for a follow-up visit. She has new pain in her RLE from her lower back down to her right foot  And also has a knot on the dorsum of her R foot and R sole which she noticed 3 weeks ago. Knots are painful. These symptoms do not occur in her LLE. In her back, extremity she has associated numbness, pins and needles, no loss of sphincteric function. Pain is 8/10 Chronic lower back pain is still present - 5/10 She has a form for SSI disability from her lawyer. Now has Medicaid and is requesting a handicap placard, PCS services and referral to pain management. Last dose of Tylenol 3 was this morning and she endorses compliance with it, denies use of recreational drugs. Her asthma is stable, depression is controlled and managed by mental health at [facility].  Past Medical History:  Diagnosis Date  . Allergy    pollen, pcn  . Asthma   . Depression   . Fibroids   . History of ovarian cyst   . Medical history non-contributory    sickle cell  . Menometrorrhagia   . 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 Sickle cell trait [facility])     Past Surgical History:  Procedure Laterality Date  . CESAREAN SECTION     all three pregnancies    Family History  Problem Relation Age of Onset  . Cancer Mother        colon cancer  . Hypertension Mother   . Heart disease Father   . Cancer Father        cancer  . Other Neg Hx     Allergies  Allergen Reactions  . Bupropion Hcl Er (Sr) Hives and Other (See Comments)    The patient has an entire page of reactions that can be caused by the medication, but none that  she has suffered.  . Fruit & Vegetable Daily [Nutritional Supplements] Other (See Comments)    "all fruits causes bumps and weird feeling in her mouth"  . Penicillins Hives  . Pollen Extract-Tree Extract Other (See Comments)    Allergies  . Other Rash    "bugs" roaches    Outpatient Medications Prior to Visit  Medication Sig Dispense Refill  . albuterol (VENTOLIN HFA) 108 (90 Base) MCG/ACT inhaler INHALE 2 PUFFS INTO THE LUNGS EVERY 6 (SIX) HOURS AS NEEDED FOR WHEEZING OR SHORTNESS OF BREATH. 18 g 3  . busPIRone (BUSPAR) 15 MG tablet Take 1 tablet (15 mg total) by mouth daily. 30 tablet 2  . clindamycin (CLEOCIN) 300 MG capsule Take 1 capsule (300 mg total) by mouth 3 (three) times daily. 20 capsule 0  . clotrimazole (LOTRIMIN) 1 % cream Apply 1 application topically 2 (two) times daily. 45 g 1  . furosemide (LASIX) 20 MG tablet Take 1 tablet (20 mg total) by mouth daily as needed. 30 tablet 2  . hydrOXYzine (ATARAX/VISTARIL) 10 MG tablet TAKE 1 TABLET (10 MG TOTAL) BY MOUTH 3 (THREE) TIMES DAILY AS NEEDED. 30 tablet 0  . ibuprofen (ADVIL) 800 MG tablet Take 1 tablet (800 mg total) by mouth  3 (three) times daily. 21 tablet 0  . lactulose (CHRONULAC) 10 GM/15ML solution Take 15 mLs (10 g total) by mouth 2 (two) times daily as needed for mild constipation. 946 mL 1  . levocetirizine (XYZAL) 5 MG tablet Take 1 tablet (5 mg total) by mouth every evening. 30 tablet 2  . lidocaine (LIDODERM) 5 % PLACE 1 PATCH ONTO THE SKIN DAILY. REMOVE & DISCARD PATCH WITHIN 12 HOURS OR AS DIRECTED BY MD 30 patch 3  . Misc. Devices MISC 4-pronged cane.  Diagnosis-spinal stenosis 1 each 0  . acetaminophen-codeine (TYLENOL #3) 300-30 MG tablet Take 1 tablet by mouth every 12 (twelve) hours as needed for moderate pain. 60 tablet 2  . cloNIDine (CATAPRES) 0.2 MG tablet Take 1 tablet (0.2 mg total) by mouth at bedtime. 30 tablet 2  . DULoxetine (CYMBALTA) 60 MG capsule Take 1 capsule (60 mg total) by mouth daily. 30  capsule 6  . Fluticasone-Salmeterol (ADVAIR DISKUS) 500-50 MCG/DOSE AEPB Inhale 1 puff into the lungs 2 (two) times daily. 180 each 3  . gabapentin (NEURONTIN) 300 MG capsule Take 2 capsules (600 mg total) by mouth 2 (two) times daily. 120 capsule 6  . meloxicam (MOBIC) 7.5 MG tablet Take 1 tablet (7.5 mg total) by mouth daily. 30 tablet 3  . traZODone (DESYREL) 50 MG tablet Take 1 tablet (50 mg total) by mouth at bedtime as needed. for sleep 30 tablet 6  . cariprazine (VRAYLAR) capsule Take by mouth daily. (Patient not taking: No sig reported)    . sulfamethoxazole-trimethoprim (BACTRIM DS) 800-160 MG tablet Take 1 tablet by mouth 2 (two) times daily. (Patient not taking: No sig reported) 10 tablet 0   No facility-administered medications prior to visit.     ROS Review of Systems  Constitutional: Negative for activity change, appetite change and fatigue.  HENT: Negative for congestion, sinus pressure and sore throat.   Eyes: Negative for visual disturbance.  Respiratory: Negative for cough, chest tightness, shortness of breath and wheezing.   Cardiovascular: Negative for chest pain and palpitations.  Gastrointestinal: Negative for abdominal distention, abdominal pain and constipation.  Endocrine: Negative for polydipsia.  Genitourinary: Negative for dysuria and frequency.  Musculoskeletal:       See HPI  Skin: Negative for rash.  Neurological: Negative for tremors, light-headedness and numbness.  Hematological: Does not bruise/bleed easily.  Psychiatric/Behavioral: Negative for agitation and behavioral problems.    Objective:  BP 106/73   Pulse 91   Ht 5\' 1"  (1.549 m)   Wt 193 lb (87.5 kg)   SpO2 100%   BMI 36.47 kg/m   BP/Weight 11/02/2020 10/62/6948 01/18/6269  Systolic BP 350 093 818  Diastolic BP 73 78 98  Wt. (Lbs) 193 201.4 -  BMI 36.47 38.05 -  Some encounter information is confidential and restricted. Go to Review Flowsheets activity to see all data.       Physical Exam Constitutional:      Appearance: She is well-developed.  Neck:     Vascular: No JVD.  Cardiovascular:     Rate and Rhythm: Normal rate.     Heart sounds: Normal heart sounds. No murmur heard.   Pulmonary:     Effort: Pulmonary effort is normal.     Breath sounds: Normal breath sounds. No wheezing or rales.  Chest:     Chest wall: No tenderness.  Abdominal:     General: Bowel sounds are normal. There is no distension.     Palpations: Abdomen is soft.  There is no mass.     Tenderness: There is no abdominal tenderness.  Musculoskeletal:        General: Normal range of motion.     Right lower leg: No edema.     Left lower leg: No edema.     Comments: Bony prominence which is tender on dorsum of right foot and lateral aspect of right foot Tenderness to palpation across cervical, thoracic and lumbar spine. Positive straight leg raise bilaterally  Neurological:     Mental Status: She is alert and oriented to person, place, and time.  Psychiatric:        Mood and Affect: Mood normal.     CMP Latest Ref Rng & Units 07/05/2020 12/09/2019 06/17/2019  Glucose 65 - 99 mg/dL 89 117(H) 84  BUN 6 - 24 mg/dL 7 9 4(L)  Creatinine 0.57 - 1.00 mg/dL 0.89 1.00 0.72  Sodium 134 - 144 mmol/L 141 143 146(H)  Potassium 3.5 - 5.2 mmol/L 4.1 3.7 3.5  Chloride 96 - 106 mmol/L 102 104 102  CO2 20 - 29 mmol/L 24 25 29   Calcium 8.7 - 10.2 mg/dL 9.3 9.1 9.3  Total Protein 6.0 - 8.5 g/dL - - -  Total Bilirubin 0.0 - 1.2 mg/dL - - -  Alkaline Phos 39 - 117 IU/L - - -  AST 0 - 40 IU/L - - -  ALT 0 - 32 IU/L - - -    Lipid Panel     Component Value Date/Time   CHOL 192 07/05/2020 1000   TRIG 113 07/05/2020 1000   HDL 37 (L) 07/05/2020 1000   CHOLHDL 5.2 (H) 07/05/2020 1000   CHOLHDL 3.0 05/15/2016 0949   VLDL 13 05/15/2016 0949   LDLCALC 134 (H) 07/05/2020 1000    CBC    Component Value Date/Time   WBC 7.2 04/18/2015 1136   RBC 4.44 04/18/2015 1136   HGB 12.6  08/02/2016 1806   HCT 37.0 08/02/2016 1806   PLT 314 04/18/2015 1136   MCV 78.6 04/18/2015 1136   MCH 27.7 04/18/2015 1136   MCHC 35.2 04/18/2015 1136   RDW 13.4 04/18/2015 1136   LYMPHSABS 1.6 06/26/2010 2057   MONOABS 0.4 06/26/2010 2057   EOSABS 0.1 06/26/2010 2057   BASOSABS 0.0 06/26/2010 2057    Lab Results  Component Value Date   HGBA1C 5.3 (A) 03/11/2019    Assessment & Plan:  1. Spinal stenosis, lumbar region, with neurogenic claudication Uncontrolled Her right lower extremity symptoms seem to be radicular in nature Discussed possible management options including referral to spine specialist however she request pain management referral - Drug Screen 12+Alcohol+CRT, Ur - Ambulatory referral to Pain Clinic - acetaminophen-codeine (TYLENOL #3) 300-30 MG tablet; Take 1 tablet by mouth every 12 (twelve) hours as needed for moderate pain.  Dispense: 60 tablet; Refill: 1 - gabapentin (NEURONTIN) 300 MG capsule; Take 2 capsules (600 mg total) by mouth 2 (two) times daily.  Dispense: 120 capsule; Refill: 6 - meloxicam (MOBIC) 7.5 MG tablet; Take 1 tablet (7.5 mg total) by mouth daily.  Dispense: 30 tablet; Refill: 3  2. Right foot pain Suspicious for underlying osteoarthritis with osteophytes deposition - DG Foot Complete Right; Future - predniSONE (DELTASONE) 20 MG tablet; Take 1 tablet (20 mg total) by mouth daily with breakfast.  Dispense: 5 tablet; Refill: 0  3. Hot flashes Stable - cloNIDine (CATAPRES) 0.2 MG tablet; Take 1 tablet (0.2 mg total) by mouth at bedtime.  Dispense: 30 tablet; Refill: 2  4. Anxiety and depression Controlled Management as per mental health at [facility] - DULoxetine (CYMBALTA) 60 MG capsule; Take 1 capsule (60 mg total) by mouth daily.  Dispense: 30 capsule; Refill: 6  5. Mild persistent asthma without complication Controlled - Fluticasone-Salmeterol (ADVAIR DISKUS) 500-50 MCG/DOSE AEPB; Inhale 1 puff into the lungs 2 (two) times daily.   Dispense: 180 each; Refill: 3  6. Other insomnia Stable - traZODone (DESYREL) 50 MG tablet; Take 1 tablet (50 mg total) by mouth at bedtime as needed. for sleep  Dispense: 30 tablet; Refill: 6    Meds ordered this encounter  Medications  . predniSONE (DELTASONE) 20 MG tablet    Sig: Take 1 tablet (20 mg total) by mouth daily with breakfast.    Dispense:  5 tablet    Refill:  0  . acetaminophen-codeine (TYLENOL #3) 300-30 MG tablet    Sig: Take 1 tablet by mouth every 12 (twelve) hours as needed for moderate pain.    Dispense:  60 tablet    Refill:  1    Diagnosis spinal stenosis  . cloNIDine (CATAPRES) 0.2 MG tablet    Sig: Take 1 tablet (0.2 mg total) by mouth at bedtime.    Dispense:  30 tablet    Refill:  2  . DULoxetine (CYMBALTA) 60 MG capsule    Sig: Take 1 capsule (60 mg total) by mouth daily.    Dispense:  30 capsule    Refill:  6  . Fluticasone-Salmeterol (ADVAIR DISKUS) 500-50 MCG/DOSE AEPB    Sig: Inhale 1 puff into the lungs 2 (two) times daily.    Dispense:  180 each    Refill:  3  . gabapentin (NEURONTIN) 300 MG capsule    Sig: Take 2 capsules (600 mg total) by mouth 2 (two) times daily.    Dispense:  120 capsule    Refill:  6  . meloxicam (MOBIC) 7.5 MG tablet    Sig: Take 1 tablet (7.5 mg total) by mouth daily.    Dispense:  30 tablet    Refill:  3  . traZODone (DESYREL) 50 MG tablet    Sig: Take 1 tablet (50 mg total) by mouth at bedtime as needed. for sleep    Dispense:  30 tablet    Refill:  6    Follow-up: Return in about 2 weeks (around 11/16/2020) for Virtual visit for disability form.       Charlott Rakes, MD, FAAFP. [facility] and [place], [place]   11/02/2020, 12:10 PM

## 2020-11-02 NOTE — Progress Notes (Signed)
Having pain in legs and foot.  States she has a knot on side of foot.

## 2020-11-11 ENCOUNTER — Telehealth: Payer: Self-pay | Admitting: Family Medicine

## 2020-11-11 DIAGNOSIS — F331 Major depressive disorder, recurrent, moderate: Secondary | ICD-10-CM

## 2020-11-11 MED FILL — predniSONE 20 MG TABS: 20 | 5 days supply | Qty: 5 | Fill #0

## 2020-11-11 NOTE — Telephone Encounter (Signed)
Copied from [place] [id number]. Topic: General - Other >> Nov 10, 2020  1:43 PM Pawlus, Brayton Layman A wrote: Reason for CRM: Pt wanted to know if Dr Margarita Rana would be able to prescribe her Verlox tablets. Please advise.

## 2020-11-11 NOTE — Telephone Encounter (Signed)
[facility] on [address] does prescribe medications and do prescribe for a bunch of my patients They take walk ins on Friday afternoons and she can receive her refills there as Arman Filter is a medication prescribed by Psych. I have also placed an urgent referral.

## 2020-11-11 NOTE — Telephone Encounter (Signed)
Pt returned phone call and was given address to [facility] on [address]. Pt agreed to go.

## 2020-11-11 NOTE — Telephone Encounter (Signed)
Pt is requesting refill on Vraylar due to [facility] closing and [facility] does not prescribe any medication per patient.

## 2020-11-11 NOTE — Telephone Encounter (Signed)
Pt was called and a VM was left informing pt to return phone call. 

## 2020-11-13 LAB — DRUG SCREEN 12+ALCOHOL+CRT, UR
Amphetamines, Urine: NEGATIVE ng/mL
BENZODIAZ UR QL: NEGATIVE ng/mL
Barbiturate: NEGATIVE ng/mL
Cannabinoids: NEGATIVE ng/mL
Cocaine (Metabolite): NEGATIVE ng/mL
Creatinine, Urine: 83.4 mg/dL (ref 20.0–300.0)
Ethanol, Urine: NEGATIVE %
Meperidine: NEGATIVE ng/mL
Methadone: NEGATIVE ng/mL
OPIATE SCREEN URINE: POSITIVE ng/mL — AB
Oxycodone/Oxymorphone, Urine: NEGATIVE ng/mL
Phencyclidine: NEGATIVE ng/mL
Propoxyphene: NEGATIVE ng/mL
Tramadol: NEGATIVE ng/mL

## 2020-11-14 NOTE — Telephone Encounter (Signed)
Pt was called and informed to contact [facility] for medication.  Prednisone has been filled at [facility] and pt will pick up tomorrow.

## 2020-11-14 NOTE — Telephone Encounter (Signed)
Patient called in to speak to Dr Benard Rink nurse would like a call back to discuss medication that was denied. Can be reached at  Ph# [phone]

## 2020-11-18 MED FILL — predniSONE 20 MG TABS: 20 | 5 days supply | Qty: 5 | Fill #0

## 2020-11-18 MED FILL — cloNIDine HCL 0.2 MG TABS: 0.2 | 30 days supply | Qty: 30 | Fill #1

## 2020-11-18 MED FILL — ACETAMINOPHEN-COD #3 TABLET: 300-30 | 7 days supply | Qty: 14 | Fill #3

## 2020-12-07 ENCOUNTER — Ambulatory Visit: Payer: Medicaid Other | Attending: Family Medicine | Admitting: Family Medicine

## 2020-12-07 ENCOUNTER — Encounter: Payer: Self-pay | Admitting: Family Medicine

## 2020-12-07 ENCOUNTER — Other Ambulatory Visit: Payer: Self-pay

## 2020-12-07 DIAGNOSIS — F419 Anxiety disorder, unspecified: Secondary | ICD-10-CM

## 2020-12-07 DIAGNOSIS — F32A Depression, unspecified: Secondary | ICD-10-CM | POA: Diagnosis not present

## 2020-12-07 DIAGNOSIS — M48062 Spinal stenosis, lumbar region with neurogenic claudication: Secondary | ICD-10-CM

## 2020-12-07 DIAGNOSIS — Z029 Encounter for administrative examinations, unspecified: Secondary | ICD-10-CM

## 2020-12-07 NOTE — Progress Notes (Signed)
Virtual Visit via Telephone Note  I connected with [name], on 12/07/2020 at 10:53 AM by telephone due to the COVID-19 pandemic and verified that I am speaking with the correct person using two identifiers.   Consent: I discussed the limitations, risks, security and privacy concerns of performing an evaluation and management service by telephone and the availability of in person appointments. I also discussed with the patient that there may be a patient responsible charge related to this service. The patient expressed understanding and agreed to proceed.   Location of Patient: Home  Location of Provider: Clinic   Persons participating in Telemedicine visit: [name] Dr. Margarita Rana     History of Present Illness: [name] is a 50 year old female with a history of asthma, hypertension, depression, chronic low back pain secondary to spinal stenosis,degenerative disc disease of the thoracic spine, cervical stenosis, hot flashes,andchronic constipation who presents today for completion of disability form. She has a form from her [employer] as she is in the process of filing for disability based on her chronic low back pain from spinal stenosis with neurogenic claudication and her anxiety and depression.  States she will be unable to work due to chronic low back pain and is unable to sit or stand for prolonged periods of time.  She also has difficulty concentrating, difficulty with her memory and anxiety issues.  Mental health is managed by [facility] behavioral health.  Past Medical History:  Diagnosis Date  . Allergy    pollen, pcn  . Asthma   . Depression   . Fibroids   . History of ovarian cyst   . Medical history non-contributory    sickle cell  . Menometrorrhagia   . 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 Sickle cell trait (HCC)    Allergies  Allergen Reactions  . Bupropion Hcl Er (Sr) Hives and Other (See Comments)     The patient has an entire page of reactions that can be caused by the medication, but none that she has suffered.  . Fruit & Vegetable Daily [Nutritional Supplements] Other (See Comments)    "all fruits causes bumps and weird feeling in her mouth"  . Penicillins Hives  . Pollen Extract-Tree Extract Other (See Comments)    Allergies  . Other Rash    "bugs" roaches    Current Outpatient Medications on File Prior to Visit  Medication Sig Dispense Refill  . albuterol (VENTOLIN HFA) 108 (90 Base) MCG/ACT inhaler INHALE 2 PUFFS INTO THE LUNGS EVERY 6 (SIX) HOURS AS NEEDED FOR WHEEZING OR SHORTNESS OF BREATH. 18 g 3  . busPIRone (BUSPAR) 15 MG tablet Take 1 tablet (15 mg total) by mouth daily. 30 tablet 2  . cariprazine (VRAYLAR) capsule Take by mouth daily.    . clindamycin (CLEOCIN) 300 MG capsule Take 1 capsule (300 mg total) by mouth 3 (three) times daily. 20 capsule 0  . cloNIDine (CATAPRES) 0.2 MG tablet Take 1 tablet (0.2 mg total) by mouth at bedtime. 30 tablet 2  . clotrimazole (LOTRIMIN) 1 % cream Apply 1 application topically 2 (two) times daily. 45 g 1  . DULoxetine (CYMBALTA) 60 MG capsule Take 1 capsule (60 mg total) by mouth daily. 30 capsule 6  . Fluticasone-Salmeterol (ADVAIR DISKUS) 500-50 MCG/DOSE AEPB Inhale 1 puff into the lungs 2 (two) times daily. 180 each 3  . furosemide (LASIX) 20 MG tablet Take 1 tablet (20 mg total) by mouth  daily as needed. 30 tablet 2  . gabapentin (NEURONTIN) 300 MG capsule Take 2 capsules (600 mg total) by mouth 2 (two) times daily. 120 capsule 6  . hydrOXYzine (ATARAX/VISTARIL) 10 MG tablet TAKE 1 TABLET (10 MG TOTAL) BY MOUTH 3 (THREE) TIMES DAILY AS NEEDED. 30 tablet 0  . ibuprofen (ADVIL) 800 MG tablet Take 1 tablet (800 mg total) by mouth 3 (three) times daily. 21 tablet 0  . lactulose (CHRONULAC) 10 GM/15ML solution Take 15 mLs (10 g total) by mouth 2 (two) times daily as needed for mild constipation. 946 mL 1  . levocetirizine (XYZAL) 5 MG  tablet Take 1 tablet (5 mg total) by mouth every evening. 30 tablet 2  . lidocaine (LIDODERM) 5 % PLACE 1 PATCH ONTO THE SKIN DAILY. REMOVE & DISCARD PATCH WITHIN 12 HOURS OR AS DIRECTED BY MD 30 patch 3  . meloxicam (MOBIC) 7.5 MG tablet Take 1 tablet (7.5 mg total) by mouth daily. 30 tablet 3  . Misc. Devices MISC 4-pronged cane.  Diagnosis-spinal stenosis 1 each 0  . traZODone (DESYREL) 50 MG tablet Take 1 tablet (50 mg total) by mouth at bedtime as needed. for sleep 30 tablet 6  . acetaminophen-codeine (TYLENOL #3) 300-30 MG tablet Take 1 tablet by mouth every 12 (twelve) hours as needed for moderate pain. (Patient not taking: Reported on 12/07/2020) 60 tablet 1  . lactulose, encephalopathy, (CHRONULAC) 10 GM/15ML SOLN SMARTSIG:15 Milliliter(s) By Mouth Twice Daily PRN    . naloxone (NARCAN) nasal spray 4 mg/0.1 mL 1 (ONE) SPRAY BY NOSE AS NEEDED, IF FOUND UNRESPONSIVE THEN SPRAY INTO NOSE AND CALL 911    . oxyCODONE-acetaminophen (PERCOCET) 10-325 MG tablet Take 1 tablet by mouth 3 (three) times daily as needed.    . predniSONE (DELTASONE) 20 MG tablet Take 1 tablet (20 mg total) by mouth daily with breakfast. (Patient not taking: Reported on 12/07/2020) 5 tablet 0  . sulfamethoxazole-trimethoprim (BACTRIM DS) 800-160 MG tablet Take 1 tablet by mouth 2 (two) times daily. (Patient not taking: No sig reported) 10 tablet 0  . [DISCONTINUED] nystatin (MYCOSTATIN) 100000 UNIT/ML suspension Take 5 mLs (500,000 Units total) by mouth 4 (four) times daily. (Patient not taking: Reported on 06/17/2019) 120 mL 0   No current facility-administered medications on file prior to visit.    ROS: See HPI  Observations/Objective: Awake, alert, oriented x3 Not in acute distress Normal mood  Assessment and Plan: 1. Anxiety and depression Stable Managed by mental health  2. Spinal stenosis, lumbar region, with neurogenic claudication Uncontrolled Currently unable to work due to uncontrolled symptoms She  is currently under pain management  3. Administrative encounter Disability form has been completed and we will fax to her attorney.  Follow Up Instructions: 3 months   I discussed the assessment and treatment plan with the patient. The patient was provided an opportunity to ask questions and all were answered. The patient agreed with the plan and demonstrated an understanding of the instructions.   The patient was advised to call back or seek an in-person evaluation if the symptoms worsen or if the condition fails to improve as anticipated.     I provided 14 minutes total of non-face-to-face time during this encounter.   Charlott Rakes, MD, FAAFP. [facility] and [facility], [place]   12/07/2020, 10:53 AM

## 2020-12-07 NOTE — Progress Notes (Signed)
Needs disability forms.

## 2020-12-08 ENCOUNTER — Telehealth: Payer: Self-pay | Admitting: Family Medicine

## 2020-12-08 NOTE — Telephone Encounter (Signed)
[place] [address] [address] [phone] Says that per insurance she can get Quad cane here. However She is requesting an "upright standing walker with seat in it" instead of a cane please advise

## 2020-12-08 NOTE — Telephone Encounter (Signed)
Pt is requesting script for Rolator walker

## 2020-12-09 MED ORDER — MISC. DEVICES MISC: 0 refills | Status: AC

## 2020-12-09 NOTE — Telephone Encounter (Signed)
Done

## 2020-12-09 NOTE — Telephone Encounter (Signed)
[facility]

## 2020-12-19 ENCOUNTER — Other Ambulatory Visit: Payer: Self-pay | Admitting: Pharmacist

## 2020-12-19 ENCOUNTER — Other Ambulatory Visit: Payer: Self-pay

## 2020-12-19 DIAGNOSIS — M48062 Spinal stenosis, lumbar region with neurogenic claudication: Secondary | ICD-10-CM

## 2020-12-19 MED ORDER — LIDOCAINE 5 % EX PTCH
1.0000 | MEDICATED_PATCH | CUTANEOUS | 3 refills | Status: DC
  Filled 2020-12-19 – 2021-01-18 (×4): qty 30, 30d supply, fill #0

## 2020-12-19 MED FILL — Clonidine HCl Tab 0.2 MG: ORAL | 30 days supply | Qty: 30 | Fill #0 | Status: AC

## 2020-12-19 MED FILL — Fluticasone-Salmeterol Aer Powder BA 500-50 MCG/ACT: RESPIRATORY_TRACT | 30 days supply | Qty: 60 | Fill #0 | Status: AC

## 2020-12-19 MED FILL — Albuterol Sulfate Inhal Aero 108 MCG/ACT (90MCG Base Equiv): RESPIRATORY_TRACT | 25 days supply | Qty: 8.5 | Fill #0 | Status: AC

## 2020-12-20 ENCOUNTER — Other Ambulatory Visit: Payer: Self-pay

## 2020-12-21 ENCOUNTER — Other Ambulatory Visit: Payer: Self-pay

## 2020-12-27 ENCOUNTER — Ambulatory Visit (HOSPITAL_COMMUNITY)
Admission: RE | Admit: 2020-12-27 | Discharge: 2020-12-27 | Disposition: A | Discharge status: Home / Self Care | Payer: 59 | Source: Ambulatory Visit | Attending: Family Medicine | Admitting: Family Medicine

## 2020-12-27 ENCOUNTER — Ambulatory Visit: Payer: Medicaid Other

## 2020-12-27 ENCOUNTER — Other Ambulatory Visit: Payer: Self-pay

## 2020-12-27 DIAGNOSIS — M79671 Pain in right foot: Secondary | ICD-10-CM | POA: Insufficient documentation

## 2020-12-29 ENCOUNTER — Telehealth: Payer: Self-pay

## 2020-12-29 NOTE — Telephone Encounter (Signed)
Contacted pt to go over xray results pt is aware and doesn't have any questions or concerns  

## 2021-01-06 ENCOUNTER — Telehealth: Payer: Self-pay | Admitting: Family Medicine

## 2021-01-06 ENCOUNTER — Telehealth: Payer: Self-pay

## 2021-01-06 NOTE — Telephone Encounter (Signed)
Called to check the status of medical necessity forms that were faxed for signature.  These forms are for [facility] Medicaid purposes.  Stated that they are faxing the forms again today.  Please advise when received at [phone]

## 2021-01-06 NOTE — Telephone Encounter (Signed)
Patient name and DOB has been verified Patient was informed of lab results. Patient had no questions.  

## 2021-01-06 NOTE — Telephone Encounter (Signed)
-----   Message from Charlott Rakes, MD sent at 01/03/2021  8:26 AM EDT ----- Please inform her that she does have a small fracture of the right fifth toe.  Management includes taping the fifth toe to the fourth toe and this usually heals spontaneously.  If pain continues she may need to wear a rigid shoe and will need orthopedic referral.

## 2021-01-06 NOTE — Telephone Encounter (Signed)
Paperwork has been filled out by PCP and will be faxed today.

## 2021-01-12 ENCOUNTER — Other Ambulatory Visit: Payer: Self-pay

## 2021-01-12 MED ORDER — FLUTICASONE-SALMETEROL 500-50 MCG/ACT IN AEPB
1.0000 | INHALATION_SPRAY | Freq: Two times a day (BID) | RESPIRATORY_TRACT | 10 refills | Status: DC
Start: 2020-11-02 — End: 2022-01-31
  Filled 2021-01-12 – 2021-03-27 (×3): qty 60, 30d supply, fill #0
  Filled 2021-04-26: qty 60, 30d supply, fill #1

## 2021-01-18 ENCOUNTER — Other Ambulatory Visit: Payer: Self-pay

## 2021-01-18 MED FILL — Clonidine HCl Tab 0.2 MG: ORAL | 30 days supply | Qty: 30 | Fill #1 | Status: AC

## 2021-01-19 ENCOUNTER — Other Ambulatory Visit: Payer: Self-pay

## 2021-01-25 ENCOUNTER — Ambulatory Visit: Payer: Medicaid Other

## 2021-01-26 ENCOUNTER — Other Ambulatory Visit: Payer: Self-pay

## 2021-01-26 MED ORDER — TRAZODONE HCL 100 MG PO TABS
ORAL_TABLET | ORAL | 2 refills | Status: DC
  Filled 2021-01-26: qty 30, 30d supply, fill #0

## 2021-01-26 MED ORDER — PRAZOSIN HCL 1 MG PO CAPS
ORAL_CAPSULE | ORAL | 2 refills | Status: AC
  Filled 2021-01-26: qty 30, 30d supply, fill #0

## 2021-01-26 MED ORDER — BUSPIRONE HCL 15 MG PO TABS
ORAL_TABLET | ORAL | 2 refills | Status: AC
  Filled 2021-01-26: qty 60, 30d supply, fill #0

## 2021-01-26 MED ORDER — VRAYLAR 1.5 MG PO CAPS
ORAL_CAPSULE | ORAL | 2 refills | Status: AC
  Filled 2021-01-26 (×2): qty 60, 30d supply, fill #0

## 2021-01-27 ENCOUNTER — Other Ambulatory Visit: Payer: Self-pay

## 2021-02-02 ENCOUNTER — Other Ambulatory Visit: Payer: Self-pay

## 2021-02-03 ENCOUNTER — Other Ambulatory Visit: Payer: Self-pay

## 2021-02-09 ENCOUNTER — Encounter: Payer: Self-pay | Admitting: Family Medicine

## 2021-02-09 ENCOUNTER — Other Ambulatory Visit: Payer: Self-pay

## 2021-02-09 ENCOUNTER — Ambulatory Visit: Payer: 59 | Attending: Family Medicine | Admitting: Family Medicine

## 2021-02-09 VITALS — BP 110/76 | HR 71 | Ht 61.0 in

## 2021-02-09 DIAGNOSIS — M79671 Pain in right foot: Secondary | ICD-10-CM

## 2021-02-09 DIAGNOSIS — F331 Major depressive disorder, recurrent, moderate: Secondary | ICD-10-CM | POA: Diagnosis not present

## 2021-02-09 DIAGNOSIS — Z720 Tobacco use: Secondary | ICD-10-CM

## 2021-02-09 DIAGNOSIS — Z5900 Homelessness unspecified: Secondary | ICD-10-CM

## 2021-02-09 DIAGNOSIS — Z1159 Encounter for screening for other viral diseases: Secondary | ICD-10-CM

## 2021-02-09 DIAGNOSIS — F17210 Nicotine dependence, cigarettes, uncomplicated: Secondary | ICD-10-CM

## 2021-02-09 DIAGNOSIS — M48062 Spinal stenosis, lumbar region with neurogenic claudication: Secondary | ICD-10-CM | POA: Diagnosis not present

## 2021-02-09 DIAGNOSIS — Z1211 Encounter for screening for malignant neoplasm of colon: Secondary | ICD-10-CM

## 2021-02-09 NOTE — Progress Notes (Signed)
Subjective:  Patient ID: [name], female    DOB: 12/03/1970  Age: 50 y.o. MRN: [id number]  CC: Back Pain   HPI ARMYA WESTERHOFF is a89 year old female with a history of asthma, hypertension, depression, chronic low back pain secondary to spinal stenosis,degenerative disc disease of the thoracic spine, cervical stenosis, hot flashes,andchronic constipation  Pain is managed by [facility].  Interval History: She will be getting her teeth extracted soon and is excited about this. Smoking 1 cig/day and is working on quitting.  She has chronic back pain which is tolerable today and described as moderate as she has taken her pain medication.  Still ambulates with a cane. She does have pain in the lateral aspect of her right foot and denies a history of trauma.  Has not noticed swelling. Depression is managed by mental health.  She reports doing well. She continues to have challenges with housing and is living in a motel with her daughter.  She has a Education officer, museum who is assisting her in the process of getting in touch with [employer]. Past Medical History:  Diagnosis Date  . Allergy    pollen, pcn  . Asthma   . Depression   . Fibroids   . History of ovarian cyst   . Medical history non-contributory    sickle cell  . Menometrorrhagia   . 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 Sickle cell trait [facility])     Past Surgical History:  Procedure Laterality Date  . CESAREAN SECTION     all three pregnancies    Family History  Problem Relation Age of Onset  . Cancer Mother        colon cancer  . Hypertension Mother   . Heart disease Father   . Cancer Father        cancer  . Other Neg Hx     Allergies  Allergen Reactions  . Bupropion Hcl Er (Sr) Hives and Other (See Comments)    The patient has an entire page of reactions that can be caused by the medication, but none that she has suffered.  . Fruit & Vegetable Daily [Nutritional Supplements]  Other (See Comments)    "all fruits causes bumps and weird feeling in her mouth"  . Penicillins Hives  . Pollen Extract-Tree Extract Other (See Comments)    Allergies  . Other Rash    "bugs" roaches    Outpatient Medications Prior to Visit  Medication Sig Dispense Refill  . albuterol (VENTOLIN HFA) 108 (90 Base) MCG/ACT inhaler INHALE 2 PUFFS INTO THE LUNGS EVERY 6 (SIX) HOURS AS NEEDED FOR WHEEZING OR SHORTNESS OF BREATH. 18 g 3  . busPIRone (BUSPAR) 15 MG tablet Take 1 tablet by mouth 2 times per day 60 tablet 2  . cariprazine (VRAYLAR) 1.5 MG capsule Take 2 capsules by mouth daily. 60 capsule 2  . cloNIDine (CATAPRES) 0.2 MG tablet TAKE 1 TABLET (0.2 MG TOTAL) BY MOUTH AT BEDTIME. 30 tablet 2  . clotrimazole (LOTRIMIN) 1 % cream Apply 1 application topically 2 (two) times daily. 45 g 1  . DULoxetine (CYMBALTA) 60 MG capsule TAKE 1 CAPSULE (60 MG TOTAL) BY MOUTH DAILY. 30 capsule 6  . fluticasone-salmeterol (ADVAIR) 500-50 MCG/ACT AEPB Inhale 1 puff into the lungs 2 (two) times daily. 60 each 10  . furosemide (LASIX) 20 MG tablet TAKE 1 TABLET (20 MG TOTAL) BY MOUTH DAILY AS NEEDED. 30 tablet 2  . gabapentin (  NEURONTIN) 300 MG capsule TAKE 2 CAPSULES (600 MG TOTAL) BY MOUTH 2 (TWO) TIMES DAILY. 120 capsule 6  . hydrOXYzine (ATARAX/VISTARIL) 10 MG tablet TAKE 1 TABLET (10 MG TOTAL) BY MOUTH 3 (THREE) TIMES DAILY AS NEEDED. 30 tablet 0  . ibuprofen (ADVIL) 800 MG tablet Take 1 tablet (800 mg total) by mouth 3 (three) times daily. 21 tablet 0  . lactulose (CHRONULAC) 10 GM/15ML solution Take 15 mLs (10 g total) by mouth 2 (two) times daily as needed for mild constipation. 946 mL 1  . levocetirizine (XYZAL) 5 MG tablet Take 1 tablet (5 mg total) by mouth every evening. 30 tablet 2  . lidocaine (LIDODERM) 5 % Place 1 patch onto the skin daily. Remove & Discard patch within 12 hours or as directed by MD 30 patch 3  . meloxicam (MOBIC) 7.5 MG tablet TAKE 1 TABLET (7.5 MG TOTAL) BY MOUTH DAILY. 30  tablet 3  . Misc. Devices MISC 4-pronged cane.  Diagnosis-spinal stenosis 1 each 0  . Misc. Devices MISC Rollator walker with seat.  Diagnosis spinal stenosis 1 each 0  . naloxone (NARCAN) nasal spray 4 mg/0.1 mL 1 (ONE) SPRAY BY NOSE AS NEEDED, IF FOUND UNRESPONSIVE THEN SPRAY INTO NOSE AND CALL 911    . oxyCODONE-acetaminophen (PERCOCET) 10-325 MG tablet Take 1 tablet by mouth 3 (three) times daily as needed.    . prazosin (MINIPRESS) 1 MG capsule Take 1 capsule by mouth at bedtime for nightmares 30 capsule 2  . predniSONE (DELTASONE) 20 MG tablet Take 1 tablet (20 mg total) by mouth daily with breakfast. (Patient not taking: Reported on 12/07/2020) 5 tablet 0  . predniSONE (DELTASONE) 20 MG tablet TAKE 1 TABLET (20 MG TOTAL) BY MOUTH DAILY WITH BREAKFAST. 5 tablet 0  . sulfamethoxazole-trimethoprim (BACTRIM DS) 800-160 MG tablet Take 1 tablet by mouth 2 (two) times daily. (Patient not taking: No sig reported) 10 tablet 0  . traZODone (DESYREL) 50 MG tablet TAKE 1 TABLET (50 MG TOTAL) BY MOUTH AT BEDTIME AS NEEDED. FOR SLEEP 30 tablet 6  . acetaminophen-codeine (TYLENOL #3) 300-30 MG tablet Take 1 tablet by mouth every 12 (twelve) hours as needed for moderate pain. (Patient not taking: Reported on 12/07/2020) 60 tablet 1  . Acetaminophen-Codeine 300-30 MG tablet TAKE 1 TABLET BY MOUTH EVERY 12 (TWELVE) HOURS AS NEEDED FOR MODERATE PAIN. 60 tablet 1  . albuterol (VENTOLIN HFA) 108 (90 Base) MCG/ACT inhaler INHALE 2 PUFFS INTO THE LUNGS EVERY 6 (SIX) HOURS AS NEEDED FOR WHEEZING OR SHORTNESS OF BREATH. 18 g 3  . albuterol (VENTOLIN HFA) 108 (90 Base) MCG/ACT inhaler INHALE 2 PUFFS INTO THE LUNGS EVERY 6 HOURS AS NEEDED FOR WHEEZING OR SHORTNESS OF BREATH 8.5 g 1  . albuterol (VENTOLIN HFA) 108 (90 Base) MCG/ACT inhaler INHALE 2 PUFFS INTO THE LUNGS EVERY 6 (SIX) HOURS AS NEEDED FOR WHEEZING OR SHORTNESS OF BREATH. 18 g 3  . busPIRone (BUSPAR) 15 MG tablet Take 1 tablet (15 mg total) by mouth daily. 30  tablet 2  . cariprazine (VRAYLAR) capsule Take by mouth daily.    . clindamycin (CLEOCIN) 300 MG capsule Take 1 capsule (300 mg total) by mouth 3 (three) times daily. 20 capsule 0  . clindamycin (CLEOCIN) 300 MG capsule TAKE 1 CAPSULE (300 MG TOTAL) BY MOUTH 3 (THREE) TIMES DAILY. 20 capsule 0  . cloNIDine (CATAPRES) 0.2 MG tablet Take 1 tablet (0.2 mg total) by mouth at bedtime. 30 tablet 2  . cloNIDine (CATAPRES) 0.2  MG tablet TAKE 1 TABLET (0.2 MG TOTAL) BY MOUTH AT BEDTIME. 30 tablet 2  . cloNIDine (CATAPRES) 0.2 MG tablet TAKE 1 TABLET (0.2 MG TOTAL) BY MOUTH AT BEDTIME. 30 tablet 2  . cloNIDine (CATAPRES) 0.2 MG tablet TAKE 1 TABLET (0.2 MG TOTAL) BY MOUTH AT BEDTIME. 30 tablet 2  . DULoxetine (CYMBALTA) 60 MG capsule Take 1 capsule (60 mg total) by mouth daily. 30 capsule 6  . DULoxetine (CYMBALTA) 60 MG capsule TAKE 1 CAPSULE (60 MG TOTAL) BY MOUTH DAILY. 30 capsule 6  . Fluticasone-Salmeterol (ADVAIR DISKUS) 500-50 MCG/DOSE AEPB Inhale 1 puff into the lungs 2 (two) times daily. 180 each 3  . fluticasone-salmeterol (ADVAIR) 500-50 MCG/ACT AEPB Inhale 1 puff into the lungs 2 (two) times daily. 180 each 3  . furosemide (LASIX) 20 MG tablet Take 1 tablet (20 mg total) by mouth daily as needed. 30 tablet 2  . gabapentin (NEURONTIN) 300 MG capsule Take 2 capsules (600 mg total) by mouth 2 (two) times daily. 120 capsule 6  . hydrOXYzine (ATARAX/VISTARIL) 10 MG tablet TAKE 1 TABLET (10 MG TOTAL) BY MOUTH 3 (THREE) TIMES DAILY AS NEEDED. 30 tablet 0  . hydrOXYzine (ATARAX/VISTARIL) 10 MG tablet TAKE 1 TABLET (10 MG TOTAL) BY MOUTH 3 (THREE) TIMES DAILY AS NEEDED. 30 tablet 0  . lactulose, encephalopathy, (CHRONULAC) 10 GM/15ML SOLN SMARTSIG:15 Milliliter(s) By Mouth Twice Daily PRN    . meloxicam (MOBIC) 7.5 MG tablet Take 1 tablet (7.5 mg total) by mouth daily. 30 tablet 3  . meloxicam (MOBIC) 7.5 MG tablet TAKE 1 TABLET (7.5 MG TOTAL) BY MOUTH DAILY. 30 tablet 3  . traZODone (DESYREL) 100 MG  tablet Take 1 tablet by mouth at bedtime for sleep 30 tablet 2  . traZODone (DESYREL) 50 MG tablet Take 1 tablet (50 mg total) by mouth at bedtime as needed. for sleep 30 tablet 6   No facility-administered medications prior to visit.     ROS Review of Systems  Constitutional: Negative for activity change, appetite change and fatigue.  HENT: Negative for congestion, sinus pressure and sore throat.   Eyes: Negative for visual disturbance.  Respiratory: Negative for cough, chest tightness, shortness of breath and wheezing.   Cardiovascular: Negative for chest pain and palpitations.  Gastrointestinal: Negative for abdominal distention, abdominal pain and constipation.  Endocrine: Negative for polydipsia.  Genitourinary: Negative for dysuria and frequency.  Musculoskeletal: Negative for arthralgias and back pain.       See HPI  Skin: Negative for rash.  Neurological: Negative for tremors, light-headedness and numbness.  Hematological: Does not bruise/bleed easily.  Psychiatric/Behavioral: Negative for agitation and behavioral problems.    Objective:  BP 110/76   Pulse 71   Ht _0  (1.549 m)   SpO2 95%   BMI 36.47 kg/m   BP/Weight 02/09/2021 11/02/2020 78/29/5621  Systolic BP 308 657 846  Diastolic BP 76 73 78  Wt. (Lbs) - 193 201.4  BMI 36.47 36.47 38.05  Some encounter information is confidential and restricted. Go to Review Flowsheets activity to see all data.      Physical Exam Constitutional:      Appearance: She is well-developed.  Neck:     Vascular: No JVD.  Cardiovascular:     Rate and Rhythm: Normal rate.     Heart sounds: Normal heart sounds. No murmur heard.   Pulmonary:     Effort: Pulmonary effort is normal.     Breath sounds: Normal breath sounds. No wheezing or rales.  Chest:     Chest wall: No tenderness.  Abdominal:     General: Bowel sounds are normal. There is no distension.     Palpations: Abdomen is soft. There is no mass.     Tenderness:  There is no abdominal tenderness.  Musculoskeletal:     Right lower leg: No edema.     Left lower leg: No edema.     Comments: Positive straight leg raise on the right Tenderness on lateral aspect of right foot  Neurological:     Mental Status: She is alert and oriented to person, place, and time.  Psychiatric:        Mood and Affect: Mood normal.     CMP Latest Ref Rng & Units 07/05/2020 12/09/2019 06/17/2019  Glucose 65 - 99 mg/dL 89 117(H) 84  BUN 6 - 24 mg/dL 7 9 4(L)  Creatinine 0.57 - 1.00 mg/dL 0.89 1.00 0.72  Sodium 134 - 144 mmol/L 141 143 146(H)  Potassium 3.5 - 5.2 mmol/L 4.1 3.7 3.5  Chloride 96 - 106 mmol/L 102 104 102  CO2 20 - 29 mmol/L _0 Calcium 8.7 - 10.2 mg/dL 9.3 9.1 9.3  Total Protein 6.0 - 8.5 g/dL - - -  Total Bilirubin 0.0 - 1.2 mg/dL - - -  Alkaline Phos 39 - 117 IU/L - - -  AST 0 - 40 IU/L - - -  ALT 0 - 32 IU/L - - -    Lipid Panel     Component Value Date/Time   CHOL 192 07/05/2020 1000   TRIG 113 07/05/2020 1000   HDL 37 (L) 07/05/2020 1000   CHOLHDL 5.2 (H) 07/05/2020 1000   CHOLHDL 3.0 05/15/2016 0949   VLDL 13 05/15/2016 0949   LDLCALC 134 (H) 07/05/2020 1000    CBC    Component Value Date/Time   WBC 7.2 04/18/2015 1136   RBC 4.44 04/18/2015 1136   HGB 12.6 08/02/2016 1806   HCT 37.0 08/02/2016 1806   PLT 314 04/18/2015 1136   MCV 78.6 04/18/2015 1136   MCH 27.7 04/18/2015 1136   MCHC 35.2 04/18/2015 1136   RDW 13.4 04/18/2015 1136   LYMPHSABS 1.6 06/26/2010 2057   MONOABS 0.4 06/26/2010 2057   EOSABS 0.1 06/26/2010 2057   BASOSABS 0.0 06/26/2010 2057    Lab Results  Component Value Date   HGBA1C 5.3 (A) 03/11/2019    Assessment & Plan:  1. Spinal stenosis, lumbar region, with neurogenic claudication Uncontrolled but stable Discussed chronic pain and the role of exercise, yoga Pain medications as per pain management - CMP14+EGFR - Lipid panel  2. Right foot pain Advised to apply topical NSAID to lateral  aspect of right foot  3. Moderate episode of recurrent major depressive disorder (HCC) Controlled Continue Vraylar, BuSpar, hydroxyzine Follow-up with [facility]  4. Lack of housing She does have a Education officer, museum on her case Declines care coordination from the clinic  5. Tobacco abuse Spent 3 minutes counseling on smoking cessation and she is working on quitting  6. Screening for colon cancer - Ambulatory referral to Gastroenterology  7. Need for hepatitis C screening test - HCV RNA quant rflx ultra or genotyp([facility])  No orders of the defined types were placed in this encounter.   Follow-up: Return in about 6 months (around 08/12/2021) for Chronic disease management.       Charlott Rakes, MD, FAAFP. [facility] and [place], [place]   02/09/2021, 1:52 PM

## 2021-02-09 NOTE — Patient Instructions (Signed)
Leslie Dales and Winn neurological surgery (7th ed., pp. [phone]). [place], [place]: Elsevier.">  Spinal Stenosis  Spinal stenosis happens when the spinal canal gets smaller. The spinal canal is the space between the bones of your spine (vertebrae). As the canal gets smaller, the nerves that pass that part of the spine are pressed. This causes pain. Spinal stenosis can affect your neck, upper back, or lower back. What are the causes? This condition is caused by parts of bone that push into your spinal canal. This problem may start before birth. If it occurs after birth, the cause may be:  Breakdown of bones of your spine. This normally starts around 51 years of age.  Injury to your spine.  Tumors in your spine.  A buildup of calcium in your spine. What increases the risk? You are more likely to develop this condition if:  You are older than age 51.  You were born with a problem in your spine, such as a curved spine (scoliosis).  You have arthritis. This is disease of your joints. What are the signs or symptoms? Common symptoms of this condition include:  Pain in your neck or back. The pain may be worse when you stand or walk.  Problems with your legs. A leg may lose feeling, tingle, or turn hot or cold.  Pain that goes from your butt down to your lower leg (sciatica).  Falling a lot.  Slapping your foot down when you walk. This weakens muscles. Severe symptoms of this condition include:  Problems pooping or peeing.  Trouble having sex.  Loss of feeling in your leg.  Being unable to walk. Sometimes there are no symptoms. How is this treated? To treat pain and manage symptoms, you may be asked to:  Practice sitting and standing up straight. This is good posture.  Exercise.  Lose weight, if needed.  Take medicines or get shots.  Support your back with a corset or a brace. In some cases, you may need to have surgery. Follow these instructions at home: Managing pain,  stiffness, and swelling  Stand and sit up straight. If you have a brace or a corset, wear it as told.  Keep a healthy weight. Talk with your doctor if you need help losing weight.  If told, put heat on the affected area. Do this as often as told by your doctor. Use the heat source that your doctor recommends, such as a moist heat pack or a heating pad. ? Place a towel between your skin and the heat source. ? Leave the heat on for 20-30 minutes. ? Take off the heat if your skin turns bright red. This is very important if you are unable to feel pain, heat, or cold. You have a greater risk of getting burned.   Activity  Do exercises as told by your doctor.  Do not do activities that cause pain. Ask your doctor what activities are safe for you.  Do not lift anything that is heavier than 10 lb (4.5 kg), or the limit that you are told by your doctor.  Return to your normal activities as told by your doctor. Ask your doctor what activities are safe for you. General instructions  Take over-the-counter and prescription medicines only as told by your doctor.  Do not use any products that contain nicotine or tobacco, such as cigarettes, e-cigarettes, and chewing tobacco. If you need help quitting, ask your doctor.  Eat a healthy diet. Eat a lot of: ? Fruits. ? Vegetables. ?  Whole grains. ? Low-fat (lean) protein.  Keep all follow-up visits as told by your doctor. This is important. Contact a doctor if:  Your symptoms do not get better.  Your symptoms get worse.  You have a fever. Get help right away if:  You have new pain or very bad pain in your neck or upper back.  You have very bad pain, and medicine does not help.  You have a very bad headache.  You are dizzy.  You do not see well.  You vomit or feel like you may vomit.  You have these things in your back or legs: ? New or worse loss of feeling. ? New or worse tingling.  Your arm or leg: ? Hurts or swells. ? Turns  red. ? Feels warm. Summary  Spinal stenosis happens when the space between the bones of the spine gets smaller. The small space puts pressure on the nerves in your spine.  This condition may start before birth. Breakdown of bones, injuries, or tumors can cause this condition after birth.  Spinal stenosis can cause pain in the neck, back, legs, or butt. A leg may lose feeling, tingle, or turn hot or cold.  Treatment for this condition focuses on lessening your pain and other symptoms. This information is not intended to replace advice given to you by your health care provider. Make sure you discuss any questions you have with your health care provider. Document Revised: 07/02/2019 Document Reviewed: 07/02/2019 Elsevier Patient Education  2021 [employer].

## 2021-02-11 LAB — HCV RNA QUANT RFLX ULTRA OR GENOTYP: HCV Quant Baseline: NOT DETECTED IU/mL

## 2021-02-11 LAB — CMP14+EGFR
ALT: 9 IU/L (ref 0–32)
AST: 12 IU/L (ref 0–40)
Albumin/Globulin Ratio: 1.7 (ref 1.2–2.2)
Albumin: 4.4 g/dL (ref 3.8–4.9)
Alkaline Phosphatase: 106 IU/L (ref 44–121)
BUN/Creatinine Ratio: 9 (ref 9–23)
BUN: 8 mg/dL (ref 6–24)
Bilirubin Total: 0.6 mg/dL (ref 0.0–1.2)
CO2: 24 mmol/L (ref 20–29)
Calcium: 9.5 mg/dL (ref 8.7–10.2)
Chloride: 101 mmol/L (ref 96–106)
Creatinine, Ser: 0.9 mg/dL (ref 0.57–1.00)
Globulin, Total: 2.6 g/dL (ref 1.5–4.5)
Glucose: 84 mg/dL (ref 65–99)
Potassium: 4.4 mmol/L (ref 3.5–5.2)
Sodium: 140 mmol/L (ref 134–144)
Total Protein: 7 g/dL (ref 6.0–8.5)
eGFR: 77 mL/min/{1.73_m2} (ref >59)

## 2021-02-11 LAB — LIPID PANEL
Chol/HDL Ratio: 4.3 ratio (ref 0.0–4.4)
Cholesterol, Total: 191 mg/dL (ref 100–199)
HDL: 44 mg/dL (ref >39)
LDL Chol Calc (NIH): 129 mg/dL — ABNORMAL HIGH (ref 0–99)
Triglycerides: 96 mg/dL (ref 0–149)
VLDL Cholesterol Cal: 18 mg/dL (ref 5–40)

## 2021-02-15 ENCOUNTER — Other Ambulatory Visit: Payer: Self-pay

## 2021-02-15 ENCOUNTER — Ambulatory Visit
Admission: RE | Admit: 2021-02-15 | Discharge: 2021-02-15 | Disposition: A | Discharge status: Home / Self Care | Payer: Medicaid Other | Source: Ambulatory Visit | Attending: Family Medicine | Admitting: Family Medicine

## 2021-02-15 DIAGNOSIS — Z1231 Encounter for screening mammogram for malignant neoplasm of breast: Secondary | ICD-10-CM

## 2021-02-16 ENCOUNTER — Other Ambulatory Visit: Payer: Self-pay

## 2021-02-16 MED FILL — Albuterol Sulfate Inhal Aero 108 MCG/ACT (90MCG Base Equiv): RESPIRATORY_TRACT | 25 days supply | Qty: 8.5 | Fill #1 | Status: AC

## 2021-02-16 MED FILL — Clonidine HCl Tab 0.2 MG: ORAL | 30 days supply | Qty: 30 | Fill #2 | Status: AC

## 2021-02-27 ENCOUNTER — Telehealth: Payer: Self-pay | Admitting: Family Medicine

## 2021-02-27 NOTE — Telephone Encounter (Signed)
Will route to PCP for review.  Will keep look out for paperwork.

## 2021-02-27 NOTE — Telephone Encounter (Signed)
Copied from [place] [id number]. Topic: General - Other >> Feb 27, 2021 10:03 AM Lennox Solders wrote: Reason for CRM: Pt is calling and will have [employer] fax over form [id number] to be completed by dr Margarita Rana. Pt would like her cna to get one additional hr per week to help with laundry duties

## 2021-03-03 NOTE — Telephone Encounter (Signed)
Paperwork has been received.  Pt was called and she states that she currently has an aide that comes out for 2 hours she is requesting aide to have 1 more hour so that she can help wash and dry clothes.  Form will be completed and placed in folder section D need to have explanation of change in medical status that she is needing more hours.

## 2021-03-26 ENCOUNTER — Other Ambulatory Visit: Payer: Self-pay | Admitting: Family Medicine

## 2021-03-26 MED ORDER — CLONIDINE HCL 0.2 MG PO TABS
ORAL_TABLET | ORAL | 0 refills | Status: DC
  Filled 2021-03-26: qty 90, fill #0

## 2021-03-26 NOTE — Telephone Encounter (Signed)
Requested Prescriptions  Pending Prescriptions Disp Refills  . cloNIDine (CATAPRES) 0.2 MG tablet 90 tablet 0     Cardiovascular:  Alpha-2 Agonists Passed - 03/26/2021 11:45 AM      Passed - Last BP in normal range    BP Readings from Last 1 Encounters:  02/09/21 110/76         Passed - Last Heart Rate in normal range    Pulse Readings from Last 1 Encounters:  02/09/21 71         Passed - Valid encounter within last 6 months    Recent Outpatient Visits          1 month ago Spinal stenosis, lumbar region, with neurogenic claudication   [place], Charlane Ferretti, MD   3 months ago Anxiety and depression   [place], Charlane Ferretti, MD   4 months ago Spinal stenosis, lumbar region, with neurogenic claudication   [place] Charlott Rakes, MD   8 months ago Dental abscess   [place], Charlane Ferretti, MD   11 months ago Spinal stenosis, lumbar region, with neurogenic claudication   [place], [place], NP      Future Appointments            In 4 months Charlott Rakes, MD [place]

## 2021-03-27 ENCOUNTER — Other Ambulatory Visit: Payer: Self-pay

## 2021-03-27 ENCOUNTER — Other Ambulatory Visit: Payer: Self-pay | Admitting: Pharmacist

## 2021-03-27 MED ORDER — CLONIDINE HCL 0.2 MG PO TABS
ORAL_TABLET | ORAL | 2 refills | Status: DC
  Filled 2021-03-27: qty 30, 30d supply, fill #0
  Filled 2021-04-26: qty 30, 30d supply, fill #1
  Filled 2021-05-28: qty 30, 30d supply, fill #2

## 2021-03-27 MED FILL — Albuterol Sulfate Inhal Aero 108 MCG/ACT (90MCG Base Equiv): RESPIRATORY_TRACT | 25 days supply | Qty: 8.5 | Fill #2 | Status: AC

## 2021-03-28 ENCOUNTER — Other Ambulatory Visit: Payer: Self-pay

## 2021-04-26 ENCOUNTER — Other Ambulatory Visit: Payer: Self-pay | Admitting: Family Medicine

## 2021-04-26 ENCOUNTER — Other Ambulatory Visit: Payer: Self-pay

## 2021-04-26 ENCOUNTER — Telehealth: Payer: Self-pay | Admitting: Family Medicine

## 2021-04-26 DIAGNOSIS — K5909 Other constipation: Secondary | ICD-10-CM

## 2021-04-26 MED ORDER — LACTULOSE ENCEPHALOPATHY 10 GM/15ML PO SOLN
10.0000 g | Freq: Two times a day (BID) | ORAL | 1 refills | Status: DC | PRN
  Filled 2021-04-26 – 2021-05-08 (×2): qty 946, 32d supply, fill #0

## 2021-04-26 MED FILL — Albuterol Sulfate Inhal Aero 108 MCG/ACT (90MCG Base Equiv): RESPIRATORY_TRACT | 25 days supply | Qty: 8.5 | Fill #3 | Status: AC

## 2021-04-26 NOTE — Telephone Encounter (Signed)
   Notes to clinic:  Medication last prescribed on 07/05/2020 Review for continued use   Requested Prescriptions  Pending Prescriptions Disp Refills   lactulose (CHRONULAC) 10 GM/15ML solution 946 mL 1    Sig: Take 15 mLs (10 g total) by mouth 2 (two) times daily as needed for mild constipation.      Gastroenterology:  Laxatives Passed - 04/26/2021 11:53 AM      Passed - Valid encounter within last 12 months    Recent Outpatient Visits           2 months ago Spinal stenosis, lumbar region, with neurogenic claudication   [place], [place], MD   4 months ago Anxiety and depression   [place], Charlane Ferretti, MD   5 months ago Spinal stenosis, lumbar region, with neurogenic claudication   [place], Enobong, MD   9 months ago Dental abscess   [place], Enobong, MD   1 year ago Spinal stenosis, lumbar region, with neurogenic claudication   [place], [place], NP       Future Appointments             In 3 months Charlott Rakes, MD [place]

## 2021-04-26 NOTE — Telephone Encounter (Signed)
Copied from [place] [id number] [id number]. Topic: Appointment Scheduling - Scheduling Inquiry for Clinic >> Apr 24, 2021 10:34 AM Alanda Slim E wrote: Reason for CRM: pt called to schedule a colonoscopy/ please advise   Dorma Russell,  I see a referral in the patient chart. Would a new one need to be sent.

## 2021-05-03 ENCOUNTER — Other Ambulatory Visit: Payer: Self-pay

## 2021-05-08 ENCOUNTER — Other Ambulatory Visit: Payer: Self-pay

## 2021-05-28 ENCOUNTER — Other Ambulatory Visit: Payer: Self-pay | Admitting: Family Medicine

## 2021-05-28 NOTE — Telephone Encounter (Signed)
Requested medication (s) are due for refill today: no  Requested medication (s) are on the active medication list: no  Last refill:  05/13/20   Future visit scheduled: yes  Notes to clinic:  this med expired 05/21/21   Requested Prescriptions  Pending Prescriptions Disp Refills   albuterol (PROAIR HFA) 108 (90 Base) MCG/ACT inhaler 18 g 3    Sig: INHALE 2 PUFFS INTO THE LUNGS EVERY 6 (SIX) HOURS AS NEEDED FOR WHEEZING OR SHORTNESS OF BREATH.     Pulmonology:  Beta Agonists Failed - 05/28/2021  3:42 PM      Failed - One inhaler should last at least one month. If the patient is requesting refills earlier, contact the patient to check for uncontrolled symptoms.      Passed - Valid encounter within last 12 months    Recent Outpatient Visits           3 months ago Spinal stenosis, lumbar region, with neurogenic claudication   [place], Charlane Ferretti, MD   5 months ago Anxiety and depression   [place], Charlane Ferretti, MD   6 months ago Spinal stenosis, lumbar region, with neurogenic claudication   [place], Enobong, MD   10 months ago Dental abscess   [place], MD   1 year ago Spinal stenosis, lumbar region, with neurogenic claudication   [place], [place], NP       Future Appointments             In 2 months Charlott Rakes, MD [place]

## 2021-05-29 ENCOUNTER — Other Ambulatory Visit: Payer: Self-pay

## 2021-05-29 MED ORDER — ALBUTEROL SULFATE HFA 108 (90 BASE) MCG/ACT IN AERS
2.0000 | INHALATION_SPRAY | Freq: Four times a day (QID) | RESPIRATORY_TRACT | 2 refills | Status: DC | PRN
  Filled 2021-05-29: qty 8.5, 25d supply, fill #0
  Filled 2021-08-21: qty 8.5, 25d supply, fill #1

## 2021-05-31 ENCOUNTER — Other Ambulatory Visit: Payer: Self-pay

## 2021-06-22 ENCOUNTER — Other Ambulatory Visit: Payer: Self-pay

## 2021-06-22 ENCOUNTER — Ambulatory Visit (AMBULATORY_SURGERY_CENTER): Payer: Medicaid Other | Admitting: *Deleted

## 2021-06-22 VITALS — Ht 61.0 in | Wt 180.0 lb

## 2021-06-22 DIAGNOSIS — Z800 Family history of malignant neoplasm of digestive organs: Secondary | ICD-10-CM

## 2021-06-22 MED ORDER — NA SULFATE-K SULFATE-MG SULF 17.5-3.13-1.6 GM/177ML PO SOLN: 1.0000 | Freq: Once | ORAL | 0 refills | Status: AC

## 2021-06-22 NOTE — Progress Notes (Signed)
Pt's previsit is done over the phone and all paperwork (prep instructions, blank consent form to just read over) sent to patient.  Pt's name and DOB verified at the beginning of the previsit.  Pt denies any difficulty with ambulating.    No egg or soy allergy  No home oxygen use   No medications for weight loss taken  emmi information given  Pt has constipation issues- she takes Lactulose if she needs to have bowel movement 2 day Suprep/Miralax given Pt informed that we do not do prior authorizations for prep

## 2021-07-04 ENCOUNTER — Other Ambulatory Visit: Payer: Self-pay

## 2021-07-04 ENCOUNTER — Encounter: Payer: Self-pay | Admitting: Internal Medicine

## 2021-07-04 ENCOUNTER — Ambulatory Visit (AMBULATORY_SURGERY_CENTER): Payer: Medicaid Other | Admitting: Internal Medicine

## 2021-07-04 ENCOUNTER — Other Ambulatory Visit: Payer: Self-pay | Admitting: Family Medicine

## 2021-07-04 VITALS — BP 134/84 | HR 80 | Temp 97.1°F | Resp 20 | Ht 61.0 in | Wt 180.0 lb

## 2021-07-04 DIAGNOSIS — K649 Unspecified hemorrhoids: Secondary | ICD-10-CM

## 2021-07-04 DIAGNOSIS — Z800 Family history of malignant neoplasm of digestive organs: Secondary | ICD-10-CM | POA: Diagnosis not present

## 2021-07-04 DIAGNOSIS — K635 Polyp of colon: Secondary | ICD-10-CM

## 2021-07-04 DIAGNOSIS — D125 Benign neoplasm of sigmoid colon: Secondary | ICD-10-CM

## 2021-07-04 DIAGNOSIS — Z1211 Encounter for screening for malignant neoplasm of colon: Secondary | ICD-10-CM | POA: Diagnosis not present

## 2021-07-04 DIAGNOSIS — D122 Benign neoplasm of ascending colon: Secondary | ICD-10-CM

## 2021-07-04 HISTORY — PX: COLONOSCOPY: SHX174

## 2021-07-04 MED ORDER — SODIUM CHLORIDE 0.9 % IV SOLN: 500.0000 mL | INTRAVENOUS | Status: DC

## 2021-07-04 NOTE — Patient Instructions (Signed)
Gave patient handouts on hemorrhoids and polyps. Await pathology results. Repeat colonoscopy for surveillance will be based off pathology results.   YOU HAD AN ENDOSCOPIC PROCEDURE TODAY AT [facility]:   Refer to the procedure report that was given to you for any specific questions about what was found during the examination.  If the procedure report does not answer your questions, please call your gastroenterologist to clarify.  If you requested that your care partner not be given the details of your procedure findings, then the procedure report has been included in a sealed envelope for you to review at your convenience later.  YOU SHOULD EXPECT: Some feelings of bloating in the abdomen. Passage of more gas than usual.  Walking can help get rid of the air that was put into your GI tract during the procedure and reduce the bloating. If you had a lower endoscopy (such as a colonoscopy or flexible sigmoidoscopy) you may notice spotting of blood in your stool or on the toilet paper. If you underwent a bowel prep for your procedure, you may not have a normal bowel movement for a few days.  Please Note:  You might notice some irritation and congestion in your nose or some drainage.  This is from the oxygen used during your procedure.  There is no need for concern and it should clear up in a day or so.  SYMPTOMS TO REPORT IMMEDIATELY:  Following lower endoscopy (colonoscopy or flexible sigmoidoscopy):  Excessive amounts of blood in the stool  Significant tenderness or worsening of abdominal pains  Swelling of the abdomen that is new, acute  Fever of 100F or higher  For urgent or emergent issues, a gastroenterologist can be reached at any hour by calling [phone]. Do not use MyChart messaging for urgent concerns.    DIET:  We do recommend a small meal at first, but then you may proceed to your regular diet.  Drink plenty of fluids but you should avoid alcoholic beverages for  24 hours.  ACTIVITY:  You should plan to take it easy for the rest of today and you should NOT DRIVE or use heavy machinery until tomorrow (because of the sedation medicines used during the test).    FOLLOW UP: Our staff will call the number listed on your records 48-72 hours following your procedure to check on you and address any questions or concerns that you may have regarding the information given to you following your procedure. If we do not reach you, we will leave a message.  We will attempt to reach you two times.  During this call, we will ask if you have developed any symptoms of COVID 19. If you develop any symptoms (ie: fever, flu-like symptoms, shortness of breath, cough etc.) before then, please call [phone].  If you test positive for Covid 19 in the 2 weeks post procedure, please call and report this information to [place].    If any biopsies were taken you will be contacted by phone or by letter within the next 1-3 weeks.  Please call us at [phone] if you have not heard about the biopsies in 3 weeks.    SIGNATURES/CONFIDENTIALITY: You and/or your care partner have signed paperwork which will be entered into your electronic medical record.  These signatures attest to the fact that that the information above on your After Visit Summary has been reviewed and is understood.  Full responsibility of the confidentiality of this discharge information lies with you and/or  your care-partner.

## 2021-07-04 NOTE — Op Note (Signed)
[facility] Patient Name: [name] Procedure Date: 07/04/2021 2:12 PM MRN: [id number] Endoscopist: Sonny Masters "[name] ,  Age: 50 Referring MD:  Date of Birth: 09-08-1971 Gender: Female Account #: [id number] Procedure:                Colonoscopy Indications:              Screening in patient at increased risk: Family                            history of 1st-degree relative with colorectal                            cancer before age 43 years, This is the patient's                            first colonoscopy Medicines:                Monitored Anesthesia Care Procedure:                Pre-Anesthesia Assessment:                           - Prior to the procedure, a History and Physical                            was performed, and patient medications and                            allergies were reviewed. The patient's tolerance of                            previous anesthesia was also reviewed. The risks                            and benefits of the procedure and the sedation                            options and risks were discussed with the patient.                            All questions were answered, and informed consent                            was obtained. Prior Anticoagulants: The patient has                            taken no previous anticoagulant or antiplatelet                            agents. ASA Grade Assessment: II - A patient with                            mild systemic disease. After reviewing the risks  and benefits, the patient was deemed in                            satisfactory condition to undergo the procedure.                           After obtaining informed consent, the colonoscope                            was passed under direct vision. Throughout the                            procedure, the patient's blood pressure, pulse, and                            oxygen saturations were monitored  continuously. The                            CF [id number] #[id number] was introduced through the anus                            and advanced to the the terminal ileum. The                            colonoscopy was performed without difficulty. The                            patient tolerated the procedure well. The quality                            of the bowel preparation was good. Scope In: 2:33:45 PM Scope Out: 7:89:38 PM Scope Withdrawal Time: 0 hours 19 minutes 4 seconds  Total Procedure Duration: 0 hours 22 minutes 33 seconds  Findings:                 The terminal ileum appeared normal.                           A 10 mm polyp was found in the ascending colon. The                            polyp was sessile. The polyp was removed with a                            cold snare. Resection and retrieval were complete.                           Five sessile polyps were found in the sigmoid                            colon. The polyps were 2 to 4 mm in size. These  polyps were removed with a cold snare. Resection                            and retrieval were complete.                           Non-bleeding internal hemorrhoids were found during                            retroflexion. Complications:            No immediate complications. Estimated Blood Loss:     Estimated blood loss was minimal. Impression:               - The examined portion of the ileum was normal.                           - One 10 mm polyp in the ascending colon, removed                            with a cold snare. Resected and retrieved.                           - Five 2 to 4 mm polyps in the sigmoid colon,                            removed with a cold snare. Resected and retrieved.                           - Non-bleeding internal hemorrhoids. Recommendation:           - Discharge patient to home (with escort).                           - Await pathology results.                            - The findings and recommendations were discussed                            with the patient and/or patient's family. Sonny Masters "[name],  07/04/2021 3:07:02 PM

## 2021-07-04 NOTE — Progress Notes (Signed)
GASTROENTEROLOGY PROCEDURE H&P NOTE   Primary Care Physician: Charlott Rakes, MD    Reason for Procedure:   Colon cancer screening (high risk)  Plan:    Colonoscopy  Patient is appropriate for endoscopic procedure(s) in the ambulatory ([facility]) setting.  The nature of the procedure, as well as the risks, benefits, and alternatives were carefully and thoroughly reviewed with the patient. Ample time for discussion and questions allowed. The patient understood, was satisfied, and agreed to proceed.     HPI: [name] is a 50 y.o. female who presents for colonoscopy for colon cancer screening. Her mother had colon cancer at age 35. This is her first colonoscopy. Denies hematochezia, melena, weight loss, or diarrhea. She does have chronic issues with constipation. Denies use of blood thinners.  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 Authorizing Provider  albuterol (PROAIR HFA) 108 (90 Base) MCG/ACT inhaler INHALE 2 PUFFS INTO THE LUNGS EVERY 6 (SIX) HOURS AS NEEDED FOR WHEEZING OR SHORTNESS OF BREATH. 05/29/21 05/29/22 Yes Newlin, Charlane Ferretti, MD  busPIRone (BUSPAR) 15 MG tablet Take 1 tablet by mouth 2 times per day 01/26/21  Yes   cariprazine (VRAYLAR) 1.5 MG capsule Take 2 capsules by mouth daily. 01/26/21  Yes   cloNIDine (CATAPRES) 0.2 MG tablet TAKE 1 TABLET (0.2 MG TOTAL) BY MOUTH AT BEDTIME. 03/27/21 03/27/22 Yes Newlin, Enobong, MD  clotrimazole (LOTRIMIN) 1 % cream Apply 1  application topically 2 (two) times daily. 09/13/17  Yes Newlin, Charlane Ferretti, MD  DULoxetine (CYMBALTA) 60 MG capsule TAKE 1 CAPSULE (60 MG TOTAL) BY MOUTH DAILY. 11/02/20 11/02/21 Yes Newlin, Charlane Ferretti, MD  fluticasone-salmeterol (ADVAIR) 500-50 MCG/ACT AEPB Inhale 1 puff into the lungs 2 (two) times daily. 11/02/20  Yes Newlin, Charlane Ferretti, MD  furosemide (LASIX) 20 MG tablet TAKE 1 TABLET (20 MG TOTAL) BY MOUTH DAILY AS NEEDED. 07/05/20 07/05/21 Yes Newlin, Enobong, MD  gabapentin (NEURONTIN) 300 MG capsule TAKE 2 CAPSULES (600 MG TOTAL) BY MOUTH 2 (TWO) TIMES DAILY. 11/02/20 11/02/21 Yes Newlin, Charlane Ferretti, MD  hydrOXYzine (ATARAX/VISTARIL) 10 MG tablet TAKE 1 TABLET (10 MG TOTAL) BY MOUTH 3 (THREE) TIMES DAILY AS NEEDED. 09/20/20 09/20/21 Yes Charlott Rakes, MD  ibuprofen (ADVIL) 800 MG tablet Take 1 tablet (800 mg total) by mouth 3 (three) times daily. 04/22/20  Yes Wieters, Hallie C, PA-C  lactulose, encephalopathy, (CHRONULAC) 10 GM/15ML SOLN Take 15 mLs (10 g total) by mouth 2 (two) times daily as needed for mild constipation. 04/26/21  Yes Charlott Rakes, MD  levocetirizine (XYZAL) 5 MG tablet Take 1 tablet (5 mg total) by mouth every evening. 12/31/18  Yes Newlin, Charlane Ferretti, MD  lidocaine (LIDODERM) 5 % Place 1 patch onto the skin daily. Remove & Discard patch within 12 hours or as directed by MD 12/19/20  Yes Charlott Rakes, MD  meloxicam (MOBIC) 7.5  MG tablet TAKE 1 TABLET (7.5 MG TOTAL) BY MOUTH DAILY. 11/02/20 11/02/21 Yes Charlott Rakes, MD  oxyCODONE-acetaminophen (PERCOCET) 10-325 MG tablet Take 1 tablet by mouth 3 (three) times daily as needed. Takes 4 times daily 11/24/20  Yes [provider]  prazosin (MINIPRESS) 1 MG capsule Take 1 capsule by mouth at bedtime for nightmares 01/26/21  Yes   traZODone (DESYREL) 50 MG tablet TAKE 1 TABLET (50 MG TOTAL) BY MOUTH AT BEDTIME AS NEEDED. FOR SLEEP 11/02/20 11/02/21 Yes Charlott Rakes, MD  Misc. Devices MISC 4-pronged cane.  Diagnosis-spinal stenosis 07/05/20    Charlott Rakes, MD  Misc. Devices MISC Rollator walker with seat.  Diagnosis spinal stenosis 12/09/20   Charlott Rakes, MD  naloxone [facility]) nasal spray 4 mg/0.1 mL 1 (ONE) SPRAY BY NOSE AS NEEDED, IF FOUND UNRESPONSIVE THEN SPRAY INTO NOSE AND CALL 911 11/24/20   [provider]  predniSONE (DELTASONE) 20 MG tablet Take 1 tablet (20 mg total) by mouth daily with breakfast. Patient not taking: No sig reported 11/02/20   Charlott Rakes, MD  predniSONE (DELTASONE) 20 MG tablet TAKE 1 TABLET (20 MG TOTAL) BY MOUTH DAILY WITH BREAKFAST. Patient not taking: Reported on 07/04/2021 11/02/20 11/02/21  Charlott Rakes, MD  varenicline (CHANTIX) 0.5 MG tablet Take 0.5 mg by mouth 3 (three) times daily. 06/26/21   [provider]  nystatin (MYCOSTATIN) 100000 UNIT/ML suspension Take 5 mLs (500,000 Units total) by mouth 4 (four) times daily. Patient not taking: Reported on 06/17/2019 10/21/18 08/06/19  Kerin Perna, NP    Current Outpatient Medications  Medication Sig Dispense Refill   albuterol (PROAIR HFA) 108 (90 Base) MCG/ACT inhaler INHALE 2 PUFFS INTO THE LUNGS EVERY 6 (SIX) HOURS AS NEEDED FOR WHEEZING OR SHORTNESS OF BREATH. 8.5 g 2   busPIRone (BUSPAR) 15 MG tablet Take 1 tablet by mouth 2 times per day 60 tablet 2   cariprazine (VRAYLAR) 1.5 MG capsule Take 2 capsules by mouth daily. 60 capsule 2   cloNIDine (CATAPRES) 0.2 MG tablet TAKE 1 TABLET (0.2 MG TOTAL) BY MOUTH AT BEDTIME. 30 tablet 2   clotrimazole (LOTRIMIN) 1 % cream Apply 1 application topically 2 (two) times daily. 45 g 1   DULoxetine (CYMBALTA) 60 MG capsule TAKE 1 CAPSULE (60 MG TOTAL) BY MOUTH DAILY. 30 capsule 6   fluticasone-salmeterol (ADVAIR) 500-50 MCG/ACT AEPB Inhale 1 puff into the lungs 2 (two) times daily. 60 each 10   furosemide (LASIX) 20 MG tablet TAKE 1 TABLET (20 MG TOTAL) BY MOUTH DAILY AS NEEDED. 30 tablet 2   gabapentin (NEURONTIN) 300 MG capsule TAKE 2 CAPSULES (600 MG TOTAL) BY MOUTH 2  (TWO) TIMES DAILY. 120 capsule 6   hydrOXYzine (ATARAX/VISTARIL) 10 MG tablet TAKE 1 TABLET (10 MG TOTAL) BY MOUTH 3 (THREE) TIMES DAILY AS NEEDED. 30 tablet 0   ibuprofen (ADVIL) 800 MG tablet Take 1 tablet (800 mg total) by mouth 3 (three) times daily. 21 tablet 0   lactulose, encephalopathy, (CHRONULAC) 10 GM/15ML SOLN Take 15 mLs (10 g total) by mouth 2 (two) times daily as needed for mild constipation. 946 mL 1   levocetirizine (XYZAL) 5 MG tablet Take 1 tablet (5 mg total) by mouth every evening. 30 tablet 2   lidocaine (LIDODERM) 5 % Place 1 patch onto the skin daily. Remove & Discard patch within 12 hours or as directed by MD 30 patch 3   meloxicam (MOBIC) 7.5 MG tablet TAKE 1 TABLET (7.5 MG TOTAL) BY MOUTH DAILY.  30 tablet 3   oxyCODONE-acetaminophen (PERCOCET) 10-325 MG tablet Take 1 tablet by mouth 3 (three) times daily as needed. Takes 4 times daily     prazosin (MINIPRESS) 1 MG capsule Take 1 capsule by mouth at bedtime for nightmares 30 capsule 2   traZODone (DESYREL) 50 MG tablet TAKE 1 TABLET (50 MG TOTAL) BY MOUTH AT BEDTIME AS NEEDED. FOR SLEEP 30 tablet 6   Misc. Devices MISC 4-pronged cane.  Diagnosis-spinal stenosis 1 each 0   Misc. Devices MISC Rollator walker with seat.  Diagnosis spinal stenosis 1 each 0   naloxone (NARCAN) nasal spray 4 mg/0.1 mL 1 (ONE) SPRAY BY NOSE AS NEEDED, IF FOUND UNRESPONSIVE THEN SPRAY INTO NOSE AND CALL 911     predniSONE (DELTASONE) 20 MG tablet Take 1 tablet (20 mg total) by mouth daily with breakfast. (Patient not taking: No sig reported) 5 tablet 0   predniSONE (DELTASONE) 20 MG tablet TAKE 1 TABLET (20 MG TOTAL) BY MOUTH DAILY WITH BREAKFAST. (Patient not taking: Reported on 07/04/2021) 5 tablet 0   varenicline (CHANTIX) 0.5 MG tablet Take 0.5 mg by mouth 3 (three) times daily.     Current Facility-Administered Medications  Medication Dose Route Frequency Provider Last Rate Last Admin   0.9 %  sodium chloride infusion  500 mL Intravenous  Continuous Sharyn Creamer, MD        Allergies as of 07/04/2021 - Review Complete 07/04/2021  Allergen Reaction Noted   Bupropion hcl er (sr) Hives and Other (See Comments) 11/12/2011   Fruit & vegetable daily [nutritional supplements] Other (See Comments) 06/19/2013   Penicillins Hives 04/09/2011   Pollen extract-tree extract Other (See Comments) 04/09/2011   Other Rash 04/27/2013    Family History  Problem Relation Age of Onset   Colon polyps Mother    Cancer Mother        colon cancer   Hypertension Mother    Heart disease Father    Cancer Father        cancer   Other Neg Hx    Esophageal cancer Neg Hx    Rectal cancer Neg Hx    Stomach cancer Neg Hx     Social History   Socioeconomic History   Marital status: Divorced    Spouse name: Not on file   Number of children: Not on file   Years of education: Not on file   Highest education level: Not on file  Occupational History   Not on file  Tobacco Use   Smoking status: Every Day    Packs/day: 0.25    Types: Cigarettes    Last attempt to quit: 02/14/2016    Years since quitting: 5.3   Smokeless tobacco: Never   Tobacco comments:    3 cigarettes daily 06-22-21  Vaping Use   Vaping Use: Never used  Substance and Sexual Activity   Alcohol use: No   Drug use: No   Sexual activity: Yes    Partners: Male    Birth control/protection: I.U.D., None  Other Topics Concern   Not on file  Social History Narrative   Not on file   Social Determinants of Health   Financial Resource Strain: Not on file  Food Insecurity: Not on file  Transportation Needs: Not on file  Physical Activity: Not on file  Stress: Not on file  Social Connections: Not on file  Intimate Partner Violence: Not on file    Physical Exam: Vital signs in last 24 hours: BP Marland Kitchen)  143/92   Pulse 69   Temp (!) 97.1 F (36.2 C) (Temporal)   Resp 12   Ht 5\' 1"  (1.549 m)   Wt 180 lb (81.6 kg)   LMP 03/30/2016   SpO2 100%   BMI 34.01 kg/m  GEN:  NAD EYE: Sclerae anicteric ENT: MMM CV: Non-tachycardic Pulm: No increased work of breathing GI: Soft, NT/ND NEURO:  Alert & Oriented   Christia Reading, MD [facility]  07/04/2021 2:21 PM

## 2021-07-04 NOTE — Progress Notes (Signed)
PT taken to PACU. Monitors in place. VSS. Report given to RN. 

## 2021-07-04 NOTE — Progress Notes (Signed)
Called to room to assist during endoscopic procedure.  Patient ID and intended procedure confirmed with present staff. Received instructions for my participation in the procedure from the performing physician.  

## 2021-07-04 NOTE — Progress Notes (Signed)
Pt's states no medical or surgical changes since previsit or office visit. 

## 2021-07-05 ENCOUNTER — Other Ambulatory Visit: Payer: Self-pay

## 2021-07-05 MED ORDER — CLONIDINE HCL 0.2 MG PO TABS
ORAL_TABLET | ORAL | 2 refills | Status: DC
  Filled 2021-07-05: qty 30, 30d supply, fill #0
  Filled 2021-08-15: qty 30, 30d supply, fill #1
  Filled 2021-10-31: qty 30, 30d supply, fill #0

## 2021-07-05 NOTE — Telephone Encounter (Signed)
Requested Prescriptions  Pending Prescriptions Disp Refills  . cloNIDine (CATAPRES) 0.2 MG tablet 30 tablet 2    Sig: TAKE 1 TABLET (0.2 MG TOTAL) BY MOUTH AT BEDTIME.     Cardiovascular:  Alpha-2 Agonists Passed - 07/04/2021 11:00 PM      Passed - Last BP in normal range    BP Readings from Last 1 Encounters:  07/04/21 134/84         Passed - Last Heart Rate in normal range    Pulse Readings from Last 1 Encounters:  07/04/21 80         Passed - Valid encounter within last 6 months    Recent Outpatient Visits          4 months ago Spinal stenosis, lumbar region, with neurogenic claudication   [place], Charlane Ferretti, MD   7 months ago Anxiety and depression   [place], Charlane Ferretti, MD   8 months ago Spinal stenosis, lumbar region, with neurogenic claudication   [place], Enobong, MD   1 year ago Dental abscess   [place], Enobong, MD   1 year ago Spinal stenosis, lumbar region, with neurogenic claudication   [place], [place], NP      Future Appointments            In 1 month Charlott Rakes, MD [place]

## 2021-07-06 ENCOUNTER — Telehealth: Payer: Self-pay | Admitting: *Deleted

## 2021-07-06 NOTE — Telephone Encounter (Signed)
  Follow up Call-  Call back number 07/04/2021  Post procedure Call Back phone  # [phone]  Permission to leave phone message Yes  Some recent data might be hidden     Patient questions:  Do you have a fever, pain , or abdominal swelling? No. Pain Score  0 *  Have you tolerated food without any problems? Yes.    Have you been able to return to your normal activities? Yes.    Do you have any questions about your discharge instructions: Diet   No. Medications  No. Follow up visit  No.  Do you have questions or concerns about your Care? No.  Actions: * If pain score is 4 or above: No action needed, pain <4.  Have you developed a fever since your procedure? no  2.   Have you had an respiratory symptoms (SOB or cough) since your procedure? no  3.   Have you tested positive for COVID 19 since your procedure no  4.   Have you had any family members/close contacts diagnosed with the COVID 19 since your procedure?  no   If yes to any of these questions please route to Joylene John, RN and Joella Prince, RN

## 2021-07-18 ENCOUNTER — Encounter: Payer: Self-pay | Admitting: Internal Medicine

## 2021-08-15 ENCOUNTER — Ambulatory Visit: Payer: Medicaid Other | Attending: Family Medicine | Admitting: Family Medicine

## 2021-08-15 ENCOUNTER — Encounter: Payer: Self-pay | Admitting: Family Medicine

## 2021-08-15 ENCOUNTER — Other Ambulatory Visit: Payer: Self-pay

## 2021-08-15 VITALS — BP 125/83 | HR 70 | Ht 61.0 in | Wt 186.4 lb

## 2021-08-15 DIAGNOSIS — M48062 Spinal stenosis, lumbar region with neurogenic claudication: Secondary | ICD-10-CM

## 2021-08-15 DIAGNOSIS — F419 Anxiety disorder, unspecified: Secondary | ICD-10-CM | POA: Diagnosis not present

## 2021-08-15 DIAGNOSIS — Z1159 Encounter for screening for other viral diseases: Secondary | ICD-10-CM | POA: Diagnosis not present

## 2021-08-15 DIAGNOSIS — Z720 Tobacco use: Secondary | ICD-10-CM

## 2021-08-15 DIAGNOSIS — F32A Depression, unspecified: Secondary | ICD-10-CM

## 2021-08-15 MED ORDER — HYDROXYZINE HCL 10 MG PO TABS
ORAL_TABLET | Freq: Three times a day (TID) | ORAL | 6 refills | Status: AC | PRN
  Filled 2021-08-15: qty 90, 30d supply, fill #0

## 2021-08-15 NOTE — Progress Notes (Signed)
Subjective:  Patient ID: [name], female    DOB: [birth year]  Age: 50 y.o. MRN: [id number]  CC: Back Pain   HPI VIANNE GRIESHOP is a 50 y.o. year old female with a history of asthma, hypertension, depression, chronic low back pain secondary to spinal stenosis, degenerative disc disease of the thoracic spine, cervical stenosis, hot flashes, and chronic constipation  Pain is managed by [facility].  Interval History: She underwent a colonoscopy last month and would like to discuss this.  Informs me she was told she would need repeat colonoscopy in 3 years. Colonoscopy revealed: Diagnosis 1. Surgical [P], colon, ascending, polyp (1) - SESSILE SERRATED POLYP WITHOUT CYTOLOGIC DYSPLASIA. 2. Surgical [P], colon, sigmoid, polyp (5) - HYPERPLASTIC POLYP (FIVE).  She is unable to obtain all her medications due to cost as she is still waiting on her disability. Back pain is still present on the right side of her lower back and radiates down her right lower extremities.  She ambulates with the aid of a cane.  Endorses regular exercise and has lost 14 pounds in the last 1 year majorly by walking. Is requesting refill of her hydroxyzine which she uses for anxiety. She is cutting back on her smoking and is down to 5 cig from 7 cig/day Past Medical History:  Diagnosis Date   Allergy    pollen, pcn   Anxiety    Asthma    Cataract    COPD (chronic obstructive pulmonary disease) (HCC)    Depression    Fibroids    Glaucoma    History of ovarian cyst    Medical history non-contributory    sickle cell   Menometrorrhagia    Sickle cell anemia (HCC)    has the trait   Sickle cell trait ([place])     Past Surgical History:  Procedure Laterality Date   CESAREAN SECTION     all three pregnancies   COLONOSCOPY  07/04/2021    Family History  Problem Relation Age of Onset   Cancer Mother        colon cancer   Colon polyps Mother    Hypertension Mother    Heart disease  Father    Cancer Father        cancer   Other Neg Hx    Esophageal cancer Neg Hx    Rectal cancer Neg Hx    Stomach cancer Neg Hx     Allergies  Allergen Reactions   Bupropion Hcl Er (Sr) Hives and Other (See Comments)    The patient has an entire page of reactions that can be caused by the medication, but none that she has suffered.   Fruit & Vegetable Daily [Nutritional Supplements] Other (See Comments)    "all fruits causes bumps and weird feeling in her mouth"   Penicillins Hives   Pollen Extract-Tree Extract Other (See Comments)    Allergies   Other Rash    "bugs" roaches    Outpatient Medications Prior to Visit  Medication Sig Dispense Refill   albuterol (PROAIR HFA) 108 (90 Base) MCG/ACT inhaler INHALE 2 PUFFS INTO THE LUNGS EVERY 6 (SIX) HOURS AS NEEDED FOR WHEEZING OR SHORTNESS OF BREATH. 8.5 g 2   busPIRone (BUSPAR) 15 MG tablet Take 1 tablet by mouth 2 times per day 60 tablet 2   cariprazine (VRAYLAR) 1.5 MG capsule Take 2 capsules by mouth daily. 60 capsule 2   cloNIDine (CATAPRES) 0.2 MG tablet TAKE 1 TABLET (0.2 MG  TOTAL) BY MOUTH AT BEDTIME. 30 tablet 2   clotrimazole (LOTRIMIN) 1 % cream Apply 1 application topically 2 (two) times daily. 45 g 1   DULoxetine (CYMBALTA) 60 MG capsule TAKE 1 CAPSULE (60 MG TOTAL) BY MOUTH DAILY. 30 capsule 6   fluticasone-salmeterol (ADVAIR) 500-50 MCG/ACT AEPB Inhale 1 puff into the lungs 2 (two) times daily. 60 each 10   gabapentin (NEURONTIN) 300 MG capsule TAKE 2 CAPSULES (600 MG TOTAL) BY MOUTH 2 (TWO) TIMES DAILY. 120 capsule 6   ibuprofen (ADVIL) 800 MG tablet Take 1 tablet (800 mg total) by mouth 3 (three) times daily. 21 tablet 0   lactulose, encephalopathy, (CHRONULAC) 10 GM/15ML SOLN Take 15 mLs (10 g total) by mouth 2 (two) times daily as needed for mild constipation. 946 mL 1   levocetirizine (XYZAL) 5 MG tablet Take 1 tablet (5 mg total) by mouth every evening. 30 tablet 2   lidocaine (LIDODERM) 5 % Place 1 patch onto the  skin daily. Remove & Discard patch within 12 hours or as directed by MD 30 patch 3   meloxicam (MOBIC) 7.5 MG tablet TAKE 1 TABLET (7.5 MG TOTAL) BY MOUTH DAILY. 30 tablet 3   Misc. Devices MISC 4-pronged cane.  Diagnosis-spinal stenosis 1 each 0   Misc. Devices MISC Rollator walker with seat.  Diagnosis spinal stenosis 1 each 0   naloxone (NARCAN) nasal spray 4 mg/0.1 mL 1 (ONE) SPRAY BY NOSE AS NEEDED, IF FOUND UNRESPONSIVE THEN SPRAY INTO NOSE AND CALL 911     oxyCODONE-acetaminophen (PERCOCET) 10-325 MG tablet Take 1 tablet by mouth 3 (three) times daily as needed. Takes 4 times daily     prazosin (MINIPRESS) 1 MG capsule Take 1 capsule by mouth at bedtime for nightmares 30 capsule 2   predniSONE (DELTASONE) 20 MG tablet TAKE 1 TABLET (20 MG TOTAL) BY MOUTH DAILY WITH BREAKFAST. 5 tablet 0   traZODone (DESYREL) 50 MG tablet TAKE 1 TABLET (50 MG TOTAL) BY MOUTH AT BEDTIME AS NEEDED. FOR SLEEP 30 tablet 6   varenicline (CHANTIX) 0.5 MG tablet Take 0.5 mg by mouth 3 (three) times daily.     hydrOXYzine (ATARAX/VISTARIL) 10 MG tablet TAKE 1 TABLET (10 MG TOTAL) BY MOUTH 3 (THREE) TIMES DAILY AS NEEDED. 30 tablet 0   furosemide (LASIX) 20 MG tablet TAKE 1 TABLET (20 MG TOTAL) BY MOUTH DAILY AS NEEDED. 30 tablet 2   predniSONE (DELTASONE) 20 MG tablet Take 1 tablet (20 mg total) by mouth daily with breakfast. (Patient not taking: Reported on 12/07/2020) 5 tablet 0   No facility-administered medications prior to visit.     ROS Review of Systems  Constitutional:  Negative for activity change, appetite change and fatigue.  HENT:  Negative for congestion, sinus pressure and sore throat.   Eyes:  Negative for visual disturbance.  Respiratory:  Negative for cough, chest tightness, shortness of breath and wheezing.   Cardiovascular:  Negative for chest pain and palpitations.  Gastrointestinal:  Negative for abdominal distention, abdominal pain and constipation.  Endocrine: Negative for polydipsia.   Genitourinary:  Negative for dysuria and frequency.  Musculoskeletal:  Positive for back pain. Negative for arthralgias.  Skin:  Negative for rash.  Neurological:  Negative for tremors, light-headedness and numbness.  Hematological:  Does not bruise/bleed easily.  Psychiatric/Behavioral:  Negative for agitation and behavioral problems.    Objective:  BP 125/83   Pulse 70   Ht 5\' 1"  (1.549 m)   Wt 186 lb 6.4 oz (  84.6 kg)   LMP 03/30/2016   SpO2 95%   BMI 35.22 kg/m   BP/Weight 08/15/2021 07/04/2021 16/09/958  Systolic BP 454 098 -  Diastolic BP 83 84 -  Wt. (Lbs) 186.4 180 180  BMI 35.22 34.01 34.01  Some encounter information is confidential and restricted. Go to Review Flowsheets activity to see all data.      Physical Exam Constitutional:      Appearance: She is well-developed.  Cardiovascular:     Rate and Rhythm: Normal rate.     Heart sounds: Normal heart sounds. No murmur heard. Pulmonary:     Effort: Pulmonary effort is normal.     Breath sounds: Normal breath sounds. No wheezing or rales.  Chest:     Chest wall: No tenderness.  Abdominal:     General: Bowel sounds are normal. There is no distension.     Palpations: Abdomen is soft. There is no mass.     Tenderness: There is no abdominal tenderness.  Musculoskeletal:        General: Tenderness (TTP of R lumbar region) present.     Right lower leg: No edema.     Left lower leg: No edema.     Comments: Positive straight leg raise bilaterally  Neurological:     Mental Status: She is alert and oriented to person, place, and time.  Psychiatric:        Mood and Affect: Mood normal.    CMP Latest Ref Rng & Units 02/09/2021 07/05/2020 12/09/2019  Glucose 65 - 99 mg/dL 84 89 117(H)  BUN 6 - 24 mg/dL 8 7 9   Creatinine 0.57 - 1.00 mg/dL 0.90 0.89 1.00  Sodium 134 - 144 mmol/L 140 141 143  Potassium 3.5 - 5.2 mmol/L 4.4 4.1 3.7  Chloride 96 - 106 mmol/L 101 102 104  CO2 20 - 29 mmol/L 24 24 25   Calcium 8.7 -  10.2 mg/dL 9.5 9.3 9.1  Total Protein 6.0 - 8.5 g/dL 7.0 - -  Total Bilirubin 0.0 - 1.2 mg/dL 0.6 - -  Alkaline Phos 44 - 121 IU/L 106 - -  AST 0 - 40 IU/L 12 - -  ALT 0 - 32 IU/L 9 - -    Lipid Panel     Component Value Date/Time   CHOL 191 02/09/2021 1024   TRIG 96 02/09/2021 1024   HDL 44 02/09/2021 1024   CHOLHDL 4.3 02/09/2021 1024   CHOLHDL 3.0 05/15/2016 0949   VLDL 13 05/15/2016 0949   LDLCALC 129 (H) 02/09/2021 1024    CBC    Component Value Date/Time   WBC 7.2 04/18/2015 1136   RBC 4.44 04/18/2015 1136   HGB 12.6 08/02/2016 1806   HCT 37.0 08/02/2016 1806   PLT 314 04/18/2015 1136   MCV 78.6 04/18/2015 1136   MCH 27.7 04/18/2015 1136   MCHC 35.2 04/18/2015 1136   RDW 13.4 04/18/2015 1136   LYMPHSABS 1.6 06/26/2010 2057   MONOABS 0.4 06/26/2010 2057   EOSABS 0.1 06/26/2010 2057   BASOSABS 0.0 06/26/2010 2057    Lab Results  Component Value Date   HGBA1C 5.3 (A) 03/11/2019    Assessment & Plan:  1. Spinal stenosis, lumbar region, with neurogenic claudication Stable with intermittent flares Currently followed by [facility] pain management Advised to apply heat or ice whichever is tolerated to painful areas. Counseled on evidence of improvement in pain control with regards to yoga, water aerobics, massage, home physical therapy, exercise as tolerated.   2.  Anxiety and depression Stable - hydrOXYzine (ATARAX) 10 MG tablet; TAKE 1 TABLET (10 MG TOTAL) BY MOUTH 3 (THREE) TIMES DAILY AS NEEDED.  Dispense: 30 tablet; Refill: 6  3. Tobacco abuse Commended on cutting back on cigarette smoking and she has been encouraged to work on quitting.  Also using nicotine patches.  Total of 3 minutes spent counseling on need for cessation  4. Need for hepatitis C screening test - HCV Ab w Reflex to Quant PCR   Meds ordered this encounter  Medications   hydrOXYzine (ATARAX) 10 MG tablet    Sig: TAKE 1 TABLET (10 MG TOTAL) BY MOUTH 3 (THREE) TIMES DAILY AS NEEDED.     Dispense:  30 tablet    Refill:  6    Follow-up: Return in about 1 month (around 09/14/2021) for PAP smear.       Charlott Rakes, MD, FAAFP. [facility] and [place], [place]   08/15/2021, 12:46 PM

## 2021-08-15 NOTE — Progress Notes (Signed)
Discuss recent colonoscopy.

## 2021-08-16 LAB — HCV INTERPRETATION

## 2021-08-16 LAB — HCV AB W REFLEX TO QUANT PCR: HCV Ab: 0.2 s/co ratio (ref 0.0–0.9)

## 2021-08-21 ENCOUNTER — Other Ambulatory Visit: Payer: Self-pay

## 2021-08-21 ENCOUNTER — Other Ambulatory Visit: Payer: Self-pay | Admitting: Pharmacist

## 2021-08-21 MED ORDER — ALBUTEROL SULFATE HFA 108 (90 BASE) MCG/ACT IN AERS
2.0000 | INHALATION_SPRAY | Freq: Four times a day (QID) | RESPIRATORY_TRACT | 2 refills | Status: DC | PRN
  Filled 2021-08-21 – 2021-10-31 (×3): qty 18, 25d supply, fill #0
  Filled 2022-01-31: qty 18, 25d supply, fill #1

## 2021-08-28 ENCOUNTER — Other Ambulatory Visit: Payer: Self-pay

## 2021-09-20 ENCOUNTER — Ambulatory Visit: Payer: Medicaid Other | Admitting: Family Medicine

## 2021-10-31 ENCOUNTER — Other Ambulatory Visit (HOSPITAL_COMMUNITY)
Admission: RE | Admit: 2021-10-31 | Discharge: 2021-10-31 | Disposition: A | Discharge status: Home / Self Care | Payer: Medicaid Other | Source: Ambulatory Visit | Attending: Family Medicine | Admitting: Family Medicine

## 2021-10-31 ENCOUNTER — Other Ambulatory Visit: Payer: Self-pay

## 2021-10-31 ENCOUNTER — Ambulatory Visit: Payer: Medicaid Other | Attending: Family Medicine | Admitting: Family Medicine

## 2021-10-31 ENCOUNTER — Encounter: Payer: Self-pay | Admitting: Family Medicine

## 2021-10-31 VITALS — BP 104/73 | HR 65 | Ht 61.0 in | Wt 191.8 lb

## 2021-10-31 DIAGNOSIS — F32A Depression, unspecified: Secondary | ICD-10-CM | POA: Insufficient documentation

## 2021-10-31 DIAGNOSIS — G8929 Other chronic pain: Secondary | ICD-10-CM | POA: Insufficient documentation

## 2021-10-31 DIAGNOSIS — Z01419 Encounter for gynecological examination (general) (routine) without abnormal findings: Secondary | ICD-10-CM | POA: Diagnosis present

## 2021-10-31 DIAGNOSIS — Z124 Encounter for screening for malignant neoplasm of cervix: Secondary | ICD-10-CM | POA: Insufficient documentation

## 2021-10-31 DIAGNOSIS — K5909 Other constipation: Secondary | ICD-10-CM | POA: Diagnosis not present

## 2021-10-31 DIAGNOSIS — M5134 Other intervertebral disc degeneration, thoracic region: Secondary | ICD-10-CM | POA: Insufficient documentation

## 2021-10-31 DIAGNOSIS — Z86010 Personal history of colonic polyps: Secondary | ICD-10-CM | POA: Diagnosis not present

## 2021-10-31 DIAGNOSIS — I10 Essential (primary) hypertension: Secondary | ICD-10-CM | POA: Diagnosis not present

## 2021-10-31 DIAGNOSIS — M4802 Spinal stenosis, cervical region: Secondary | ICD-10-CM | POA: Diagnosis not present

## 2021-10-31 DIAGNOSIS — Z87891 Personal history of nicotine dependence: Secondary | ICD-10-CM | POA: Diagnosis not present

## 2021-10-31 DIAGNOSIS — R232 Flushing: Secondary | ICD-10-CM | POA: Diagnosis not present

## 2021-10-31 DIAGNOSIS — M545 Low back pain, unspecified: Secondary | ICD-10-CM | POA: Diagnosis not present

## 2021-10-31 DIAGNOSIS — Z1272 Encounter for screening for malignant neoplasm of vagina: Secondary | ICD-10-CM | POA: Insufficient documentation

## 2021-10-31 DIAGNOSIS — J45909 Unspecified asthma, uncomplicated: Secondary | ICD-10-CM | POA: Insufficient documentation

## 2021-10-31 NOTE — Progress Notes (Signed)
Subjective:  Patient ID: [name], female    DOB: 01-30-1971  Age: 51 y.o. MRN: [id number]  CC: Gynecologic Exam   HPI [name] is a 51 y.o. year old female with a history of asthma, hypertension, depression, chronic low back pain secondary to spinal stenosis, degenerative disc disease of the thoracic spine, cervical stenosis, hot flashes, and chronic constipation  Pain is managed by [facility]. [name] quit smokng in 08/2021. [name] smoked 3cig/day since [name] was 17  Interval History: [name] presents today for Pap smear.  Last mammogram was in 02/2021 and was normal.  Last colonoscopy was in 2022 with 3-year recall recommended due to presence of sessile polyps and 10 mm hyperplastic polyp. Past Medical History:  Diagnosis Date   Allergy    pollen, pcn   Anxiety    Asthma    Cataract    COPD (chronic obstructive pulmonary disease) (HCC)    Depression    Fibroids    Glaucoma    History of ovarian cyst    Medical history non-contributory    sickle cell   Menometrorrhagia    Sickle cell anemia (HCC)    has the trait   Sickle cell trait ([place])     Past Surgical History:  Procedure Laterality Date   CESAREAN SECTION     all three pregnancies   COLONOSCOPY  07/04/2021    Family History  Problem Relation Age of Onset   Cancer Mother        colon cancer   Colon polyps Mother    Hypertension Mother    Heart disease Father    Cancer Father        cancer   Other Neg Hx    Esophageal cancer Neg Hx    Rectal cancer Neg Hx    Stomach cancer Neg Hx     Allergies  Allergen Reactions   Bupropion Hcl Er (Sr) Hives and Other (See Comments)    The patient has an entire page of reactions that can be caused by the medication, but none that [name] has suffered.   Fruit & Vegetable Daily [Nutritional Supplements] Other (See Comments)    "all fruits causes bumps and weird feeling in her mouth"   Penicillins Hives   Pollen Extract-Tree Extract Other (See Comments)     Allergies   Other Rash    "bugs" roaches    Outpatient Medications Prior to Visit  Medication Sig Dispense Refill   albuterol (VENTOLIN HFA) 108 (90 Base) MCG/ACT inhaler Inhale 2 puffs into the lungs every 6 (six) hours as needed for wheezing or shortness of breath. 18 g 2   busPIRone (BUSPAR) 15 MG tablet Take 1 tablet by mouth 2 times per day 60 tablet 2   cariprazine (VRAYLAR) 1.5 MG capsule Take 2 capsules by mouth daily. 60 capsule 2   cloNIDine (CATAPRES) 0.2 MG tablet TAKE 1 TABLET (0.2 MG TOTAL) BY MOUTH AT BEDTIME. 30 tablet 2   clotrimazole (LOTRIMIN) 1 % cream Apply 1 application topically 2 (two) times daily. 45 g 1   DULoxetine (CYMBALTA) 60 MG capsule TAKE 1 CAPSULE (60 MG TOTAL) BY MOUTH DAILY. 30 capsule 6   fluticasone-salmeterol (ADVAIR) 500-50 MCG/ACT AEPB Inhale 1 puff into the lungs 2 (two) times daily. 60 each 10   gabapentin (NEURONTIN) 300 MG capsule TAKE 2 CAPSULES (600 MG TOTAL) BY MOUTH 2 (TWO) TIMES DAILY. 120 capsule 6   hydrOXYzine (ATARAX) 10 MG tablet TAKE 1 TABLET (10 MG  TOTAL) BY MOUTH 3 (THREE) TIMES DAILY AS NEEDED. 30 tablet 6   ibuprofen (ADVIL) 800 MG tablet Take 1 tablet (800 mg total) by mouth 3 (three) times daily. 21 tablet 0   lactulose, encephalopathy, (CHRONULAC) 10 GM/15ML SOLN Take 15 mLs (10 g total) by mouth 2 (two) times daily as needed for mild constipation. 946 mL 1   levocetirizine (XYZAL) 5 MG tablet Take 1 tablet (5 mg total) by mouth every evening. 30 tablet 2   lidocaine (LIDODERM) 5 % Place 1 patch onto the skin daily. Remove & Discard patch within 12 hours or as directed by MD 30 patch 3   meloxicam (MOBIC) 7.5 MG tablet TAKE 1 TABLET (7.5 MG TOTAL) BY MOUTH DAILY. 30 tablet 3   Misc. Devices MISC 4-pronged cane.  Diagnosis-spinal stenosis 1 each 0   Misc. Devices MISC Rollator walker with seat.  Diagnosis spinal stenosis 1 each 0   naloxone (NARCAN) nasal spray 4 mg/0.1 mL 1 (ONE) SPRAY BY NOSE AS NEEDED, IF FOUND UNRESPONSIVE THEN  SPRAY INTO NOSE AND CALL 911     oxyCODONE-acetaminophen (PERCOCET) 10-325 MG tablet Take 1 tablet by mouth 3 (three) times daily as needed. Takes 4 times daily     prazosin (MINIPRESS) 1 MG capsule Take 1 capsule by mouth at bedtime for nightmares 30 capsule 2   predniSONE (DELTASONE) 20 MG tablet TAKE 1 TABLET (20 MG TOTAL) BY MOUTH DAILY WITH BREAKFAST. 5 tablet 0   traZODone (DESYREL) 50 MG tablet TAKE 1 TABLET (50 MG TOTAL) BY MOUTH AT BEDTIME AS NEEDED. FOR SLEEP 30 tablet 6   varenicline (CHANTIX) 0.5 MG tablet Take 0.5 mg by mouth 3 (three) times daily.     furosemide (LASIX) 20 MG tablet TAKE 1 TABLET (20 MG TOTAL) BY MOUTH DAILY AS NEEDED. 30 tablet 2   predniSONE (DELTASONE) 20 MG tablet Take 1 tablet (20 mg total) by mouth daily with breakfast. (Patient not taking: Reported on 12/07/2020) 5 tablet 0   No facility-administered medications prior to visit.     ROS Review of Systems  Constitutional:  Negative for activity change and appetite change.  HENT:  Negative for sinus pressure and sore throat.   Respiratory:  Negative for chest tightness, shortness of breath and wheezing.   Cardiovascular:  Negative for chest pain and palpitations.  Gastrointestinal:  Negative for abdominal distention, abdominal pain and constipation.  Genitourinary: Negative.   Musculoskeletal: Negative.   Psychiatric/Behavioral:  Negative for behavioral problems and dysphoric mood.    Objective:  BP 104/73    Pulse 65    Ht 5\' 1"  (1.549 m)    Wt 191 lb 12.8 oz (87 kg)    LMP 03/30/2016    SpO2 97%    BMI 36.24 kg/m   BP/Weight 10/31/2021 08/15/2021 94/17/4081  Systolic BP 448 185 631  Diastolic BP 73 83 84  Wt. (Lbs) 191.8 186.4 180  BMI 36.24 35.22 34.01  Some encounter information is confidential and restricted. Go to Review Flowsheets activity to see all data.      Physical Exam Constitutional:      Appearance: [name] is well-developed.  Cardiovascular:     Rate and Rhythm: Normal rate.      Heart sounds: Normal heart sounds. No murmur heard. Pulmonary:     Effort: Pulmonary effort is normal.     Breath sounds: Normal breath sounds. No wheezing or rales.  Chest:     Chest wall: No tenderness.  Abdominal:  General: Bowel sounds are normal. There is no distension.     Palpations: Abdomen is soft. There is no mass.     Tenderness: There is no abdominal tenderness.  Genitourinary:    Comments:  Genitalia, vagina, cervix, adnexa-normal Musculoskeletal:        General: Normal range of motion.     Right lower leg: No edema.     Left lower leg: No edema.  Neurological:     Mental Status: [name] is alert and oriented to person, place, and time.  Psychiatric:        Mood and Affect: Mood normal.    CMP Latest Ref Rng & Units 02/09/2021 07/05/2020 12/09/2019  Glucose 65 - 99 mg/dL 84 89 117(H)  BUN 6 - 24 mg/dL 8 7 9   Creatinine 0.57 - 1.00 mg/dL 0.90 0.89 1.00  Sodium 134 - 144 mmol/L 140 141 143  Potassium 3.5 - 5.2 mmol/L 4.4 4.1 3.7  Chloride 96 - 106 mmol/L 101 102 104  CO2 20 - 29 mmol/L 24 24 25   Calcium 8.7 - 10.2 mg/dL 9.5 9.3 9.1  Total Protein 6.0 - 8.5 g/dL 7.0 - -  Total Bilirubin 0.0 - 1.2 mg/dL 0.6 - -  Alkaline Phos 44 - 121 IU/L 106 - -  AST 0 - 40 IU/L 12 - -  ALT 0 - 32 IU/L 9 - -    Lipid Panel     Component Value Date/Time   CHOL 191 02/09/2021 1024   TRIG 96 02/09/2021 1024   HDL 44 02/09/2021 1024   CHOLHDL 4.3 02/09/2021 1024   CHOLHDL 3.0 05/15/2016 0949   VLDL 13 05/15/2016 0949   LDLCALC 129 (H) 02/09/2021 1024    CBC    Component Value Date/Time   WBC 7.2 04/18/2015 1136   RBC 4.44 04/18/2015 1136   HGB 12.6 08/02/2016 1806   HCT 37.0 08/02/2016 1806   PLT 314 04/18/2015 1136   MCV 78.6 04/18/2015 1136   MCH 27.7 04/18/2015 1136   MCHC 35.2 04/18/2015 1136   RDW 13.4 04/18/2015 1136   LYMPHSABS 1.6 06/26/2010 2057   MONOABS 0.4 06/26/2010 2057   EOSABS 0.1 06/26/2010 2057   BASOSABS 0.0 06/26/2010 2057    Lab Results   Component Value Date   HGBA1C 5.3 (A) 03/11/2019    Assessment & Plan:  1. Screening for cervical cancer We will contact patient with lab results - Cytology - PAP   Health Care Maintenance: Up-to-date on mammogram and colonoscopy No orders of the defined types were placed in this encounter.   Follow-up: Return in about 3 months (around 01/28/2022) for Chronic medical conditions.       Charlott Rakes, MD, FAAFP. [facility] and [place], [place]   10/31/2021, 5:21 PM

## 2021-10-31 NOTE — Progress Notes (Signed)
PAP SMEAR

## 2021-10-31 NOTE — Patient Instructions (Signed)
Pap Test Why am I having this test? A Pap test, also called a Pap smear, is a screening test to check for signs of: Infection. Cancer of the cervix. The cervix is the lower part of the uterus that opens into the vagina. Changes that may be a sign that cancer is developing (precancerous changes). Women need this test on a regular basis. In general, you should have a Pap test every 3 years until you reach menopause or age 52. Women aged 30-60 may choose to have their Pap test done at the same time as an HPV (human papillomavirus) test every 5 years (instead of every 3 years). Your health care provider may recommend having Pap tests more or less often depending on your medical conditions and past Pap test results. What is being tested? Cervical cells are tested for signs of infection or abnormalities. What kind of sample is taken? Your health care provider will collect a sample of cells from the surface of your cervix. This will be done using a small cotton swab, plastic spatula, or brush that is inserted into your vagina using a tool called a speculum. This sample is often collected during a pelvic exam, when you are lying on your back on an exam table with your feet in footrests (stirrups). In some cases, fluids (secretions) from the cervix or vagina may also be collected. How do I prepare for this test? Be aware of where you are in your menstrual cycle. If you are menstruating on the day of the test, you may be asked to reschedule. You may need to reschedule if you have a known vaginal infection on the day of the test. Follow instructions from your health care provider about: Changing or stopping your regular medicines. Some medicines can cause abnormal test results, such as vaginal medicines and tetracycline. Avoiding douching 2-3 days before or the day of the test. Tell a health care provider about: Any allergies you have. All medicines you are taking, including vitamins, herbs, eye drops,  creams, and over-the-counter medicines. Any bleeding problems you have. Any surgeries you have had. Any medical conditions you have. Whether you are pregnant or may be pregnant. How are the results reported? Your test results will be reported as either abnormal or normal. What do the results mean? A normal test result means that you do not have signs of cancer of the cervix. An abnormal result may mean that you have: Cancer. A Pap test by itself is not enough to diagnose cancer. You will have more tests done if cancer is suspected. Precancerous changes in your cervix. Inflammation of the cervix. An STI (sexually transmitted infection). A fungal infection. A parasite infection. Talk with your health care provider about what your results mean. In some cases, your health care provider may do more testing to confirm the results. Questions to ask your health care provider Ask your health care provider, or the department that is doing the test: When will my results be ready? How will I get my results? What are my treatment options? What other tests do I need? What are my next steps? Summary In general, women should have a Pap test every 3 years until they reach menopause or age 52. Your health care provider will collect a sample of cells from the surface of your cervix. This will be done using a small cotton swab, plastic spatula, or brush. In some cases, fluids (secretions) from the cervix or vagina may also be collected. This information is not intended  to replace advice given to you by your health care provider. Make sure you discuss any questions you have with your health care provider. Document Revised: 12/02/2020 Document Reviewed: 12/02/2020 Elsevier Patient Education  [place].

## 2021-11-02 ENCOUNTER — Telehealth: Payer: Self-pay

## 2021-11-02 LAB — CYTOLOGY - PAP
Comment: NEGATIVE
Diagnosis: NEGATIVE
High risk HPV: NEGATIVE

## 2021-11-02 NOTE — Telephone Encounter (Signed)
-----   Message from Charlott Rakes, MD sent at 11/02/2021  2:00 PM EST ----- Please inform her that her PAP smear is normal

## 2021-11-02 NOTE — Telephone Encounter (Signed)
Patient name and DOB has been verified Patient was informed of lab results. Patient had no questions.  

## 2021-11-14 ENCOUNTER — Telehealth: Payer: Self-pay | Admitting: Family Medicine

## 2021-11-14 NOTE — Telephone Encounter (Signed)
Pt stated she needs letter from PCP with all medication information and weight loss she has had from 2022 until now.  Please list all medical conditions. This is needed due to surgery she had in her mouth (teeth pulled) to be able to get to get Medicaid to re-make her teeth.  Pt stated needs PCP to sign off on it.  Pt requesting to pick this up and pt is requesting a follow up call.

## 2021-11-15 NOTE — Telephone Encounter (Signed)
Done

## 2021-11-15 NOTE — Telephone Encounter (Signed)
Pt is needing a letter stating her medical conditions, all of her medications and her weight loss from 2022 til now. ? ?I will print off her medication list once letter is complete. ?

## 2021-11-16 NOTE — Telephone Encounter (Signed)
Pt has been sent a mycahrt message. ?

## 2021-11-26 IMAGING — MG MM DIGITAL SCREENING BILAT W/ TOMO AND CAD
8 of 14 series · 8 of 40 positions shown · non-contrast
Comparison: Previous exam(s).

CLINICAL DATA: Screening.

EXAM:
DIGITAL SCREENING BILATERAL MAMMOGRAM WITH TOMOSYNTHESIS AND CAD
TECHNIQUE: Bilateral screening digital craniocaudal and mediolateral oblique
mammograms were obtained. Bilateral screening digital breast
tomosynthesis was performed. The images were evaluated with
computer-aided detection.

[R MLO synth-2D (1 of 2)]
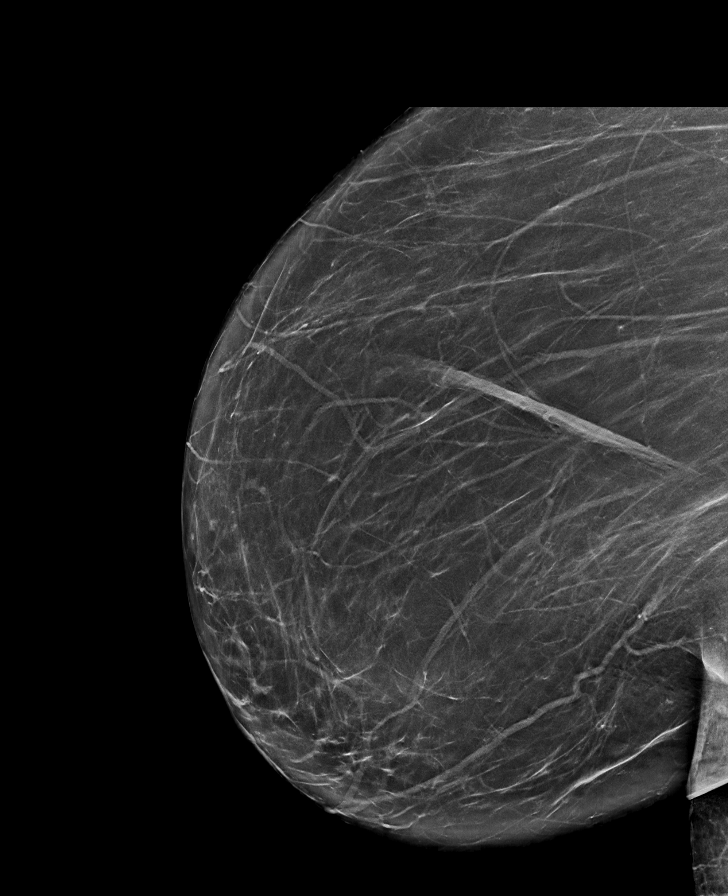

[R MLO synth-2D (2 of 2)]
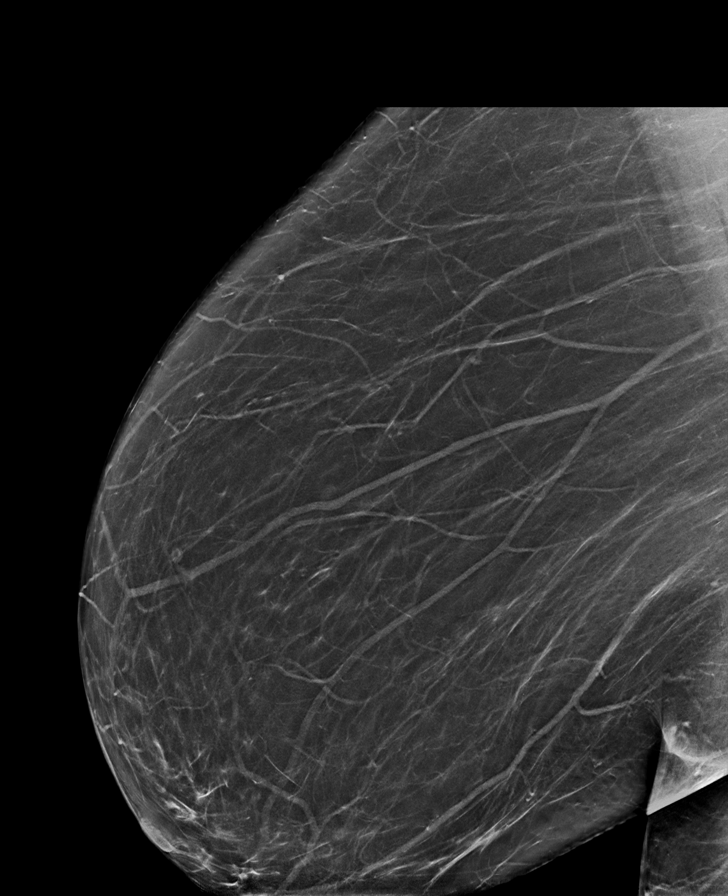

[R CV synth-2D]
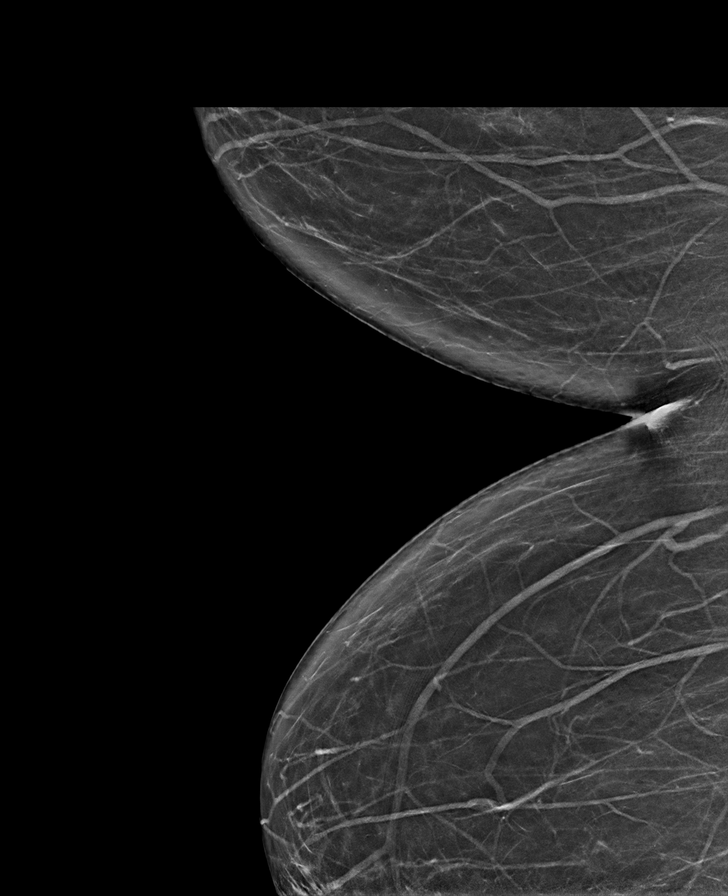

[L CC synth-2D]
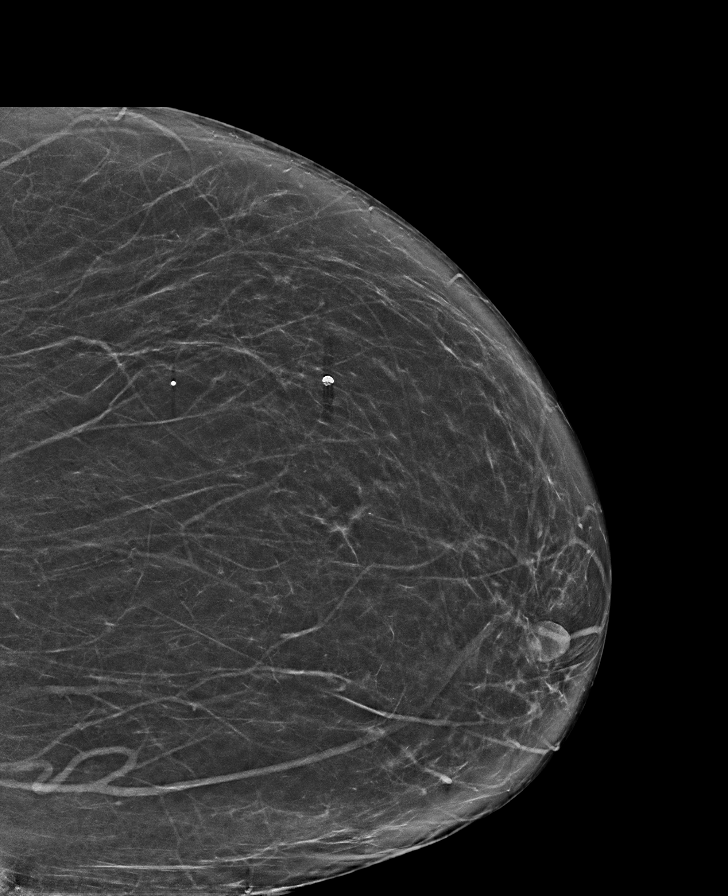

[L MLO synth-2D (1 of 2)]
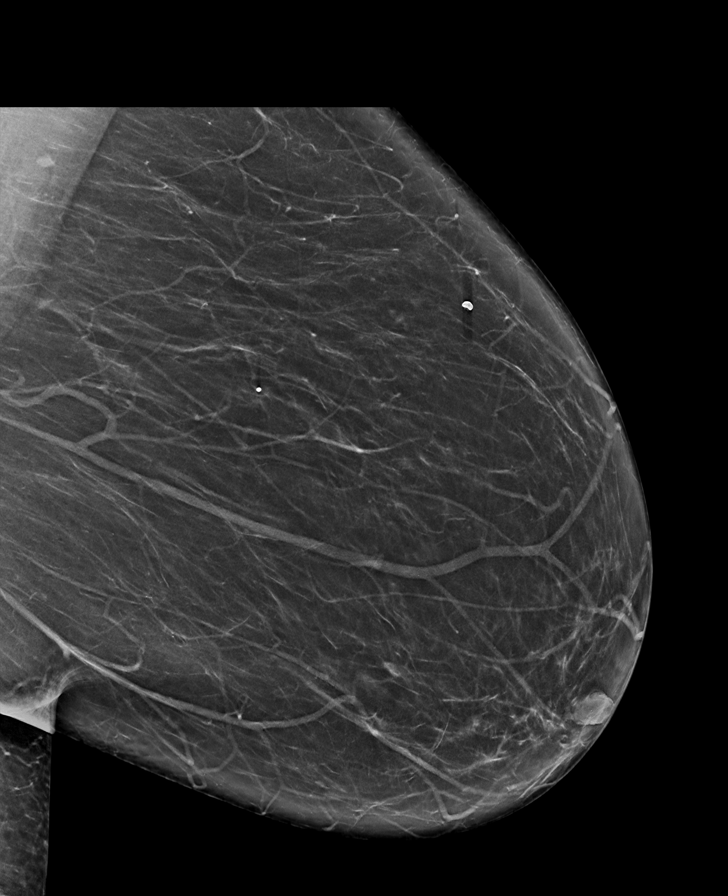

[R CC synth-2D]
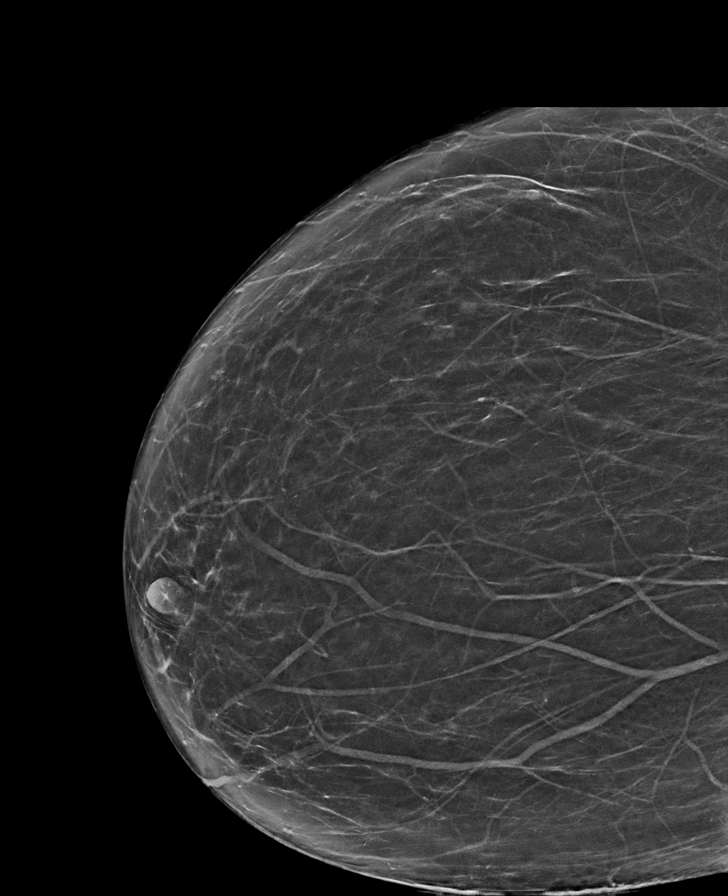

[L MLO synth-2D (2 of 2)]
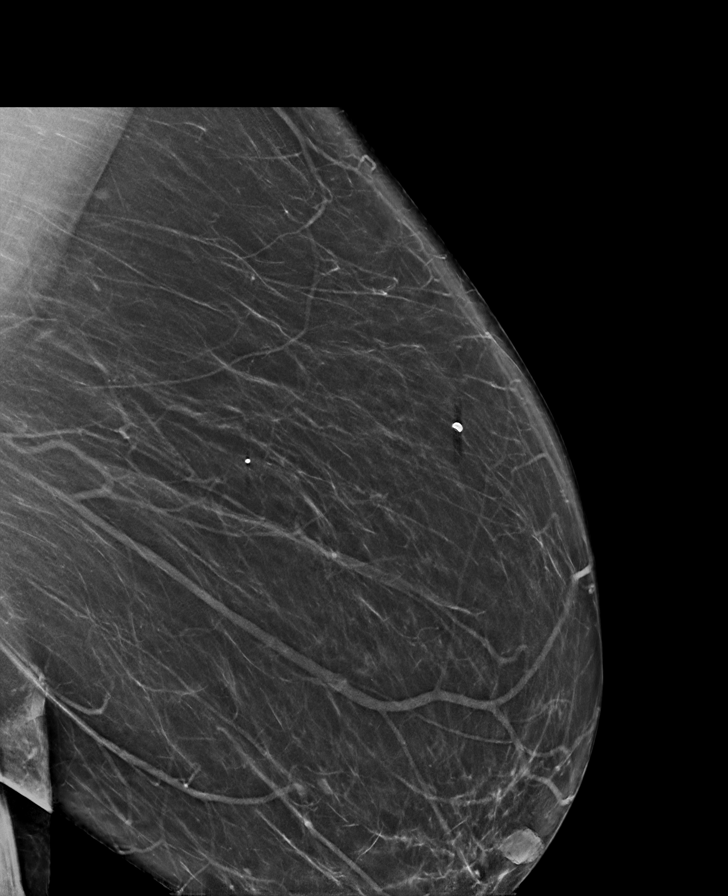

[L CC tomo · tomo slice 39/77.0]
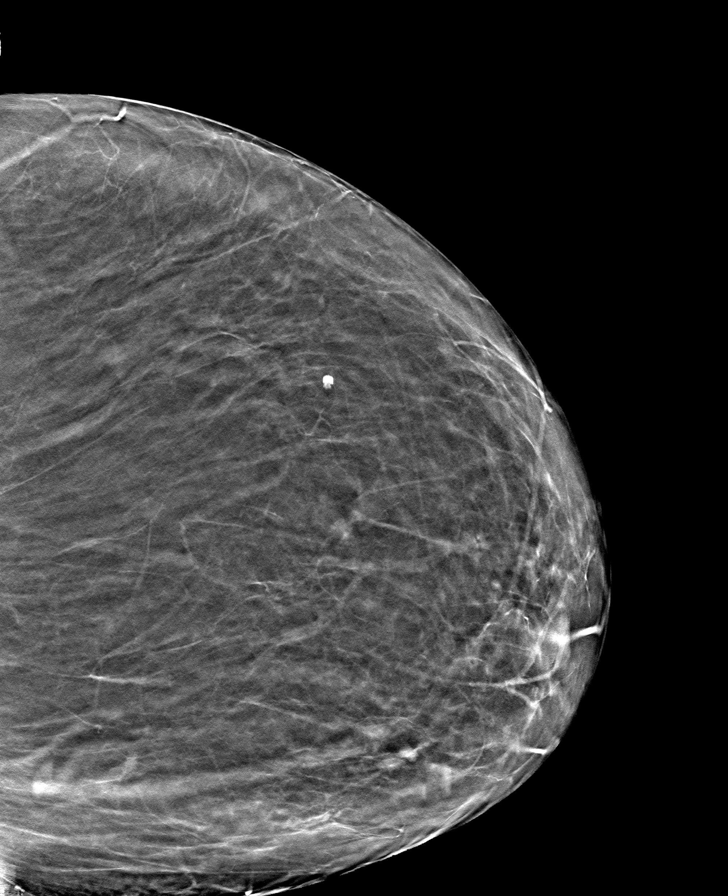

[8 of 40 positions shown; findings below may reference images not displayed]

ACR Breast Density Category b: There are scattered areas of
fibroglandular density.
FINDINGS: There are no findings suspicious for malignancy. The images were
evaluated with computer-aided detection.
IMPRESSION: No mammographic evidence of malignancy. A result letter of this
screening mammogram will be mailed directly to the patient.

RECOMMENDATION:
Screening mammogram in one year. (Code:[id number])

BI-RADS CATEGORY  1: Negative.

## 2022-01-31 ENCOUNTER — Other Ambulatory Visit: Payer: Self-pay | Admitting: Family Medicine

## 2022-01-31 ENCOUNTER — Other Ambulatory Visit: Payer: Self-pay

## 2022-01-31 ENCOUNTER — Encounter: Payer: Self-pay | Admitting: Family Medicine

## 2022-01-31 ENCOUNTER — Ambulatory Visit: Payer: Medicaid Other | Attending: Family Medicine | Admitting: Family Medicine

## 2022-01-31 VITALS — BP 108/74 | HR 70 | Ht 61.0 in | Wt 202.2 lb

## 2022-01-31 DIAGNOSIS — K5909 Other constipation: Secondary | ICD-10-CM | POA: Insufficient documentation

## 2022-01-31 DIAGNOSIS — M48062 Spinal stenosis, lumbar region with neurogenic claudication: Secondary | ICD-10-CM

## 2022-01-31 DIAGNOSIS — Z131 Encounter for screening for diabetes mellitus: Secondary | ICD-10-CM | POA: Diagnosis not present

## 2022-01-31 DIAGNOSIS — I10 Essential (primary) hypertension: Secondary | ICD-10-CM | POA: Insufficient documentation

## 2022-01-31 DIAGNOSIS — Z87891 Personal history of nicotine dependence: Secondary | ICD-10-CM | POA: Insufficient documentation

## 2022-01-31 DIAGNOSIS — Z13228 Encounter for screening for other metabolic disorders: Secondary | ICD-10-CM

## 2022-01-31 DIAGNOSIS — F32A Depression, unspecified: Secondary | ICD-10-CM | POA: Diagnosis not present

## 2022-01-31 DIAGNOSIS — M4802 Spinal stenosis, cervical region: Secondary | ICD-10-CM | POA: Insufficient documentation

## 2022-01-31 DIAGNOSIS — M5134 Other intervertebral disc degeneration, thoracic region: Secondary | ICD-10-CM | POA: Insufficient documentation

## 2022-01-31 DIAGNOSIS — Z79899 Other long term (current) drug therapy: Secondary | ICD-10-CM | POA: Diagnosis not present

## 2022-01-31 DIAGNOSIS — N951 Menopausal and female climacteric states: Secondary | ICD-10-CM | POA: Diagnosis not present

## 2022-01-31 DIAGNOSIS — K08109 Complete loss of teeth, unspecified cause, unspecified class: Secondary | ICD-10-CM

## 2022-01-31 DIAGNOSIS — F419 Anxiety disorder, unspecified: Secondary | ICD-10-CM | POA: Diagnosis not present

## 2022-01-31 DIAGNOSIS — R232 Flushing: Secondary | ICD-10-CM | POA: Diagnosis not present

## 2022-01-31 MED ORDER — CLONIDINE HCL 0.2 MG PO TABS
ORAL_TABLET | ORAL | 1 refills | Status: DC
  Filled 2022-01-31: qty 90, 90d supply, fill #0
  Filled 2022-08-02: qty 30, 30d supply, fill #1

## 2022-01-31 MED ORDER — FLUTICASONE-SALMETEROL 500-50 MCG/ACT IN AEPB
1.0000 | INHALATION_SPRAY | Freq: Two times a day (BID) | RESPIRATORY_TRACT | 1 refills | Status: DC
  Filled 2022-01-31: qty 180, 90d supply, fill #0

## 2022-01-31 NOTE — Progress Notes (Signed)
? ?Subjective:  ?Patient ID: [name], female    DOB: 02-04-1971  Age: 51 y.o. MRN: [id number] ? ?CC: Back Pain ? ? ?HPI ?[name] is a 51 y.o. year old female with a history of asthma, hypertension, depression, chronic low back pain secondary to spinal stenosis, degenerative disc disease of the thoracic spine, cervical stenosis, hot flashes, and chronic constipation  ?Pain is managed by [facility]. ? ?Interval History: ? ?She had bilateral cataract extraction and intraocular lens placement on 01/08/22 and her vision is better now. ? ?Mental Health is managed by [facility] and her Depression is better but she states she is up and down. She has an appointment with Beverly Sessions later today. ?She quit smoking 4 months ago and has noticed an increased appetite and weight gain. ?She had lost about 15 pounds between 05/2019 and 11/2019 due to recurrent dental abscesses and eventual tooth extraction and had requested a letter for social services indicating this fact so that dentures will be covered for her.  I had written the letter in 11/2021 but she never picked this up. ?She is starting to gain weight again ever since she quit smoking. ? ? ?Her back pain is stable on her current pain medications with intermittent flares.  She endorses going for walks regularly with her daughter. ?Past Medical History:  ?Diagnosis Date  ? Allergy   ? pollen, pcn  ? Anxiety   ? Asthma   ? Cataract   ? COPD (chronic obstructive pulmonary disease) ([place])   ? Depression   ? Fibroids   ? Glaucoma   ? History of ovarian cyst   ? Medical history non-contributory   ? sickle cell  ? Menometrorrhagia   ? Sickle cell anemia (HCC)   ? has the trait  ? Sickle cell trait ([place])   ? ? ?Past Surgical History:  ?Procedure Laterality Date  ? CESAREAN SECTION    ? all three pregnancies  ? COLONOSCOPY  07/04/2021  ? ? ?Family History  ?Problem Relation Age of Onset  ? Cancer Mother   ?     colon cancer  ? Colon polyps Mother   ? Hypertension  Mother   ? Heart disease Father   ? Cancer Father   ?     cancer  ? Other Neg Hx   ? Esophageal cancer Neg Hx   ? Rectal cancer Neg Hx   ? Stomach cancer Neg Hx   ? ? ?Social History  ? ?Socioeconomic History  ? Marital status: Divorced  ?  Spouse name: Not on file  ? Number of children: Not on file  ? Years of education: Not on file  ? Highest education level: Not on file  ?Occupational History  ? Not on file  ?Tobacco Use  ? Smoking status: Former  ?  Packs/day: 0.25  ?  Types: Cigarettes  ?  Quit date: 02/14/2016  ?  Years since quitting: 5.9  ? Smokeless tobacco: Not on file  ? Tobacco comments:  ?  3 cigarettes daily 06-22-21  ?Vaping Use  ? Vaping Use: Never used  ?Substance and Sexual Activity  ? Alcohol use: No  ? Drug use: No  ? Sexual activity: Yes  ?  Partners: Male  ?  Birth control/protection: I.U.D., None  ?Other Topics Concern  ? Not on file  ?Social History Narrative  ? Not on file  ? ?Social Determinants of Health  ? ?Financial Resource Strain: Not on file  ?Food Insecurity:  Not on file  ?Transportation Needs: Not on file  ?Physical Activity: Not on file  ?Stress: Not on file  ?Social Connections: Not on file  ? ? ?Allergies  ?Allergen Reactions  ? Bupropion Hcl Er (Sr) Hives and Other (See Comments)  ?  The patient has an entire page of reactions that can be caused by the medication, but none that she has suffered.  ? Fruit & Vegetable Daily [Nutritional Supplements] Other (See Comments)  ?  "all fruits causes bumps and weird feeling in her mouth"  ? Penicillins Hives  ? Pollen Extract-Tree Extract Other (See Comments)  ?  Allergies  ? Other Rash  ?  "bugs" roaches  ? ? ?Outpatient Medications Prior to Visit  ?Medication Sig Dispense Refill  ? albuterol (VENTOLIN HFA) 108 (90 Base) MCG/ACT inhaler Inhale 2 puffs into the lungs every 6 (six) hours as needed for wheezing or shortness of breath. 18 g 2  ? busPIRone (BUSPAR) 15 MG tablet Take 1 tablet by mouth 2 times per day 60 tablet 2  ? cariprazine  (VRAYLAR) 1.5 MG capsule Take 2 capsules by mouth daily. 60 capsule 2  ? clotrimazole (LOTRIMIN) 1 % cream Apply 1 application topically 2 (two) times daily. 45 g 1  ? fluticasone-salmeterol (ADVAIR) 500-50 MCG/ACT AEPB Inhale 1 puff into the lungs 2 (two) times daily. 60 each 10  ? hydrOXYzine (ATARAX) 10 MG tablet TAKE 1 TABLET (10 MG TOTAL) BY MOUTH 3 (THREE) TIMES DAILY AS NEEDED. 30 tablet 6  ? ibuprofen (ADVIL) 800 MG tablet Take 1 tablet (800 mg total) by mouth 3 (three) times daily. 21 tablet 0  ? lactulose, encephalopathy, (CHRONULAC) 10 GM/15ML SOLN Take 15 mLs (10 g total) by mouth 2 (two) times daily as needed for mild constipation. 946 mL 1  ? levocetirizine (XYZAL) 5 MG tablet Take 1 tablet (5 mg total) by mouth every evening. 30 tablet 2  ? lidocaine (LIDODERM) 5 % Place 1 patch onto the skin daily. Remove & Discard patch within 12 hours or as directed by MD 30 patch 3  ? Misc. Devices MISC 4-pronged cane.  Diagnosis-spinal stenosis 1 each 0  ? Misc. Devices MISC Rollator walker with seat.  Diagnosis spinal stenosis 1 each 0  ? naloxone (NARCAN) nasal spray 4 mg/0.1 mL 1 (ONE) SPRAY BY NOSE AS NEEDED, IF FOUND UNRESPONSIVE THEN SPRAY INTO NOSE AND CALL 911    ? oxyCODONE-acetaminophen (PERCOCET) 10-325 MG tablet Take 1 tablet by mouth 3 (three) times daily as needed. Takes 4 times daily    ? prazosin (MINIPRESS) 1 MG capsule Take 1 capsule by mouth at bedtime for nightmares 30 capsule 2  ? predniSONE (DELTASONE) 20 MG tablet Take 1 tablet (20 mg total) by mouth daily with breakfast. 5 tablet 0  ? varenicline (CHANTIX) 0.5 MG tablet Take 0.5 mg by mouth 3 (three) times daily.    ? cloNIDine (CATAPRES) 0.2 MG tablet TAKE 1 TABLET (0.2 MG TOTAL) BY MOUTH AT BEDTIME. 30 tablet 2  ? DULoxetine (CYMBALTA) 60 MG capsule TAKE 1 CAPSULE (60 MG TOTAL) BY MOUTH DAILY. 30 capsule 6  ? furosemide (LASIX) 20 MG tablet TAKE 1 TABLET (20 MG TOTAL) BY MOUTH DAILY AS NEEDED. 30 tablet 2  ? gabapentin (NEURONTIN) 300 MG  capsule TAKE 2 CAPSULES (600 MG TOTAL) BY MOUTH 2 (TWO) TIMES DAILY. 120 capsule 6  ? traZODone (DESYREL) 50 MG tablet TAKE 1 TABLET (50 MG TOTAL) BY MOUTH AT BEDTIME AS NEEDED. FOR SLEEP  30 tablet 6  ? ?No facility-administered medications prior to visit.  ? ? ? ?ROS ?Review of Systems  ?Constitutional:  Negative for activity change, appetite change and fatigue.  ?HENT:  Negative for congestion, sinus pressure and sore throat.   ?Eyes:  Negative for visual disturbance.  ?Respiratory:  Negative for cough, chest tightness, shortness of breath and wheezing.   ?Cardiovascular:  Negative for chest pain and palpitations.  ?Gastrointestinal:  Negative for abdominal distention, abdominal pain and constipation.  ?Endocrine: Negative for polydipsia.  ?Genitourinary:  Negative for dysuria and frequency.  ?Musculoskeletal:  Positive for back pain. Negative for arthralgias.  ?Skin:  Negative for rash.  ?Neurological:  Negative for tremors, light-headedness and numbness.  ?Hematological:  Does not bruise/bleed easily.  ?Psychiatric/Behavioral:  Negative for agitation and behavioral problems.   ? ?Objective:  ?BP 108/74   Pulse 70   Ht '5\' 1"'$  (1.549 m)   Wt 202 lb 3.2 oz (91.7 kg)   LMP 03/30/2016   SpO2 98%   BMI 38.21 kg/m?  ? ? ?  01/31/2022  ?  2:00 PM 10/31/2021  ?  2:12 PM 08/15/2021  ? 10:08 AM  ?BP/Weight  ?Systolic BP 923 300 762  ?Diastolic BP 74 73 83  ?Wt. (Lbs) 202.2 191.8 186.4  ?BMI 38.21 kg/m2 36.24 kg/m2 35.22 kg/m2  ? ? ? ? ?Physical Exam ?Constitutional:   ?   Appearance: She is well-developed.  ?Cardiovascular:  ?   Rate and Rhythm: Normal rate.  ?   Heart sounds: Normal heart sounds. No murmur heard. ?Pulmonary:  ?   Effort: Pulmonary effort is normal.  ?   Breath sounds: Normal breath sounds. No wheezing or rales.  ?Chest:  ?   Chest wall: No tenderness.  ?Abdominal:  ?   General: Bowel sounds are normal. There is no distension.  ?   Palpations: Abdomen is soft. There is no mass.  ?   Tenderness: There is  no abdominal tenderness.  ?Musculoskeletal:     ?   General: Normal range of motion.  ?   Right lower leg: No edema.  ?   Left lower leg: No edema.  ?Neurological:  ?   Mental Status: She is alert and oriente

## 2022-01-31 NOTE — Telephone Encounter (Signed)
Requested Prescriptions  ?Pending Prescriptions Disp Refills  ?? fluticasone-salmeterol (ADVAIR DISKUS) 500-50 MCG/ACT AEPB 60 each 10  ?  Sig: Inhale 1 puff into the lungs 2 (two) times daily.  ?  ? Pulmonology:  Combination Products Passed - 01/31/2022  2:38 PM  ?  ?  Passed - Valid encounter within last 12 months  ?  Recent Outpatient Visits   ?      ? Today Spinal stenosis, lumbar region, with neurogenic claudication  ? [place], Charlane Ferretti, MD  ? 3 months ago Screening for cervical cancer  ? [place], Charlane Ferretti, MD  ? 5 months ago Spinal stenosis, lumbar region, with neurogenic claudication  ? [place], Charlane Ferretti, MD  ? 11 months ago Spinal stenosis, lumbar region, with neurogenic claudication  ? [place] Charlott Rakes, MD  ? 1 year ago Anxiety and depression  ? [place] Charlott Rakes, MD  ?  ?  ?Future Appointments   ?        ? In 6 months Charlott Rakes, MD [place]  ?  ? ?  ?  ?  ? ? ?

## 2022-01-31 NOTE — Progress Notes (Signed)
Needs letter for social service. ?

## 2022-02-01 ENCOUNTER — Other Ambulatory Visit: Payer: Self-pay

## 2022-02-01 LAB — CBC WITH DIFFERENTIAL/PLATELET
Basophils Absolute: 0 10*3/uL (ref 0.0–0.2)
Basos: 1 %
EOS (ABSOLUTE): 0.1 10*3/uL (ref 0.0–0.4)
Eos: 2 %
Hematocrit: 35.5 % (ref 34.0–46.6)
Hemoglobin: 11.5 g/dL (ref 11.1–15.9)
Immature Grans (Abs): 0 10*3/uL (ref 0.0–0.1)
Immature Granulocytes: 0 %
Lymphocytes Absolute: 2 10*3/uL (ref 0.7–3.1)
Lymphs: 32 %
MCH: 25.4 pg — ABNORMAL LOW (ref 26.6–33.0)
MCHC: 32.4 g/dL (ref 31.5–35.7)
MCV: 78 fL — ABNORMAL LOW (ref 79–97)
Monocytes Absolute: 0.4 10*3/uL (ref 0.1–0.9)
Monocytes: 6 %
Neutrophils Absolute: 3.8 10*3/uL (ref 1.4–7.0)
Neutrophils: 59 %
Platelets: 344 10*3/uL (ref 150–450)
RBC: 4.53 x10E6/uL (ref 3.77–5.28)
RDW: 12.9 % (ref 11.7–15.4)
WBC: 6.4 10*3/uL (ref 3.4–10.8)

## 2022-02-01 LAB — CMP14+EGFR
ALT: 12 IU/L (ref 0–32)
AST: 15 IU/L (ref 0–40)
Albumin/Globulin Ratio: 1.6 (ref 1.2–2.2)
Albumin: 4.1 g/dL (ref 3.8–4.9)
Alkaline Phosphatase: 106 IU/L (ref 44–121)
BUN/Creatinine Ratio: 13 (ref 9–23)
BUN: 11 mg/dL (ref 6–24)
Bilirubin Total: 0.5 mg/dL (ref 0.0–1.2)
CO2: 24 mmol/L (ref 20–29)
Calcium: 8.9 mg/dL (ref 8.7–10.2)
Chloride: 104 mmol/L (ref 96–106)
Creatinine, Ser: 0.87 mg/dL (ref 0.57–1.00)
Globulin, Total: 2.6 g/dL (ref 1.5–4.5)
Glucose: 75 mg/dL (ref 70–99)
Potassium: 4.4 mmol/L (ref 3.5–5.2)
Sodium: 142 mmol/L (ref 134–144)
Total Protein: 6.7 g/dL (ref 6.0–8.5)
eGFR: 80 mL/min/{1.73_m2} (ref >59)

## 2022-02-01 LAB — HEMOGLOBIN A1C
Est. average glucose Bld gHb Est-mCnc: 94 mg/dL
Hgb A1c MFr Bld: 4.9 % (ref 4.8–5.6)

## 2022-02-01 LAB — LP+NON-HDL CHOLESTEROL
Cholesterol, Total: 166 mg/dL (ref 100–199)
HDL: 50 mg/dL (ref >39)
LDL Chol Calc (NIH): 102 mg/dL — ABNORMAL HIGH (ref 0–99)
Total Non-HDL-Chol (LDL+VLDL): 116 mg/dL (ref 0–129)
Triglycerides: 72 mg/dL (ref 0–149)
VLDL Cholesterol Cal: 14 mg/dL (ref 5–40)

## 2022-02-05 ENCOUNTER — Other Ambulatory Visit: Payer: Self-pay

## 2022-02-26 ENCOUNTER — Other Ambulatory Visit (HOSPITAL_COMMUNITY): Payer: Self-pay

## 2022-02-26 MED ORDER — OXYCODONE-ACETAMINOPHEN 10-325 MG PO TABS
1.0000 | ORAL_TABLET | Freq: Four times a day (QID) | ORAL | 0 refills | Status: DC | PRN
  Filled 2022-02-26: qty 120, 30d supply, fill #0

## 2022-02-27 ENCOUNTER — Other Ambulatory Visit (HOSPITAL_COMMUNITY): Payer: Self-pay

## 2022-03-15 ENCOUNTER — Other Ambulatory Visit (HOSPITAL_COMMUNITY): Payer: Self-pay

## 2022-03-15 MED ORDER — OXYCODONE-ACETAMINOPHEN 10-325 MG PO TABS
1.0000 | ORAL_TABLET | Freq: Four times a day (QID) | ORAL | 0 refills | Status: DC | PRN
  Filled 2022-03-15 – 2022-03-29 (×4): qty 120, 30d supply, fill #0

## 2022-03-26 ENCOUNTER — Other Ambulatory Visit (HOSPITAL_COMMUNITY): Payer: Self-pay

## 2022-03-27 ENCOUNTER — Other Ambulatory Visit (HOSPITAL_COMMUNITY): Payer: Self-pay

## 2022-03-28 ENCOUNTER — Other Ambulatory Visit (HOSPITAL_COMMUNITY): Payer: Self-pay

## 2022-03-29 ENCOUNTER — Other Ambulatory Visit (HOSPITAL_COMMUNITY): Payer: Self-pay

## 2022-04-11 ENCOUNTER — Other Ambulatory Visit (HOSPITAL_COMMUNITY): Payer: Self-pay

## 2022-04-11 MED ORDER — TRIAMCINOLONE ACETONIDE 0.1 % EX CREA
1.0000 | TOPICAL_CREAM | Freq: Every day | CUTANEOUS | 0 refills | Status: DC
  Filled 2022-04-11: qty 15, 15d supply, fill #0

## 2022-04-11 MED ORDER — OXYCODONE-ACETAMINOPHEN 10-325 MG PO TABS
1.0000 | ORAL_TABLET | Freq: Four times a day (QID) | ORAL | 0 refills | Status: DC | PRN
  Filled 2022-04-30: qty 80, 20d supply, fill #0
  Filled 2022-04-30: qty 120, 30d supply, fill #0
  Filled 2022-05-09: qty 40, 10d supply, fill #1

## 2022-04-25 ENCOUNTER — Other Ambulatory Visit (HOSPITAL_COMMUNITY): Payer: Self-pay

## 2022-04-25 ENCOUNTER — Emergency Department (HOSPITAL_COMMUNITY): Payer: Medicaid Other

## 2022-04-25 ENCOUNTER — Other Ambulatory Visit: Payer: Self-pay

## 2022-04-25 ENCOUNTER — Emergency Department (HOSPITAL_COMMUNITY)
Admission: EM | Admit: 2022-04-25 | Discharge: 2022-04-26 | Discharge status: Against Medical Advice | Payer: Medicaid Other | Attending: Emergency Medicine | Admitting: Emergency Medicine

## 2022-04-25 DIAGNOSIS — R0602 Shortness of breath: Secondary | ICD-10-CM | POA: Insufficient documentation

## 2022-04-25 DIAGNOSIS — Z5321 Procedure and treatment not carried out due to patient leaving prior to being seen by health care provider: Secondary | ICD-10-CM | POA: Diagnosis not present

## 2022-04-25 DIAGNOSIS — R079 Chest pain, unspecified: Secondary | ICD-10-CM | POA: Diagnosis present

## 2022-04-25 DIAGNOSIS — R200 Anesthesia of skin: Secondary | ICD-10-CM | POA: Insufficient documentation

## 2022-04-25 DIAGNOSIS — M546 Pain in thoracic spine: Secondary | ICD-10-CM | POA: Insufficient documentation

## 2022-04-25 LAB — BASIC METABOLIC PANEL
Anion gap: 7 (ref 5–15)
BUN: 13 mg/dL (ref 6–20)
CO2: 28 mmol/L (ref 22–32)
Calcium: 9.2 mg/dL (ref 8.9–10.3)
Chloride: 109 mmol/L (ref 98–111)
Creatinine, Ser: 1.04 mg/dL — ABNORMAL HIGH (ref 0.44–1.00)
GFR, Estimated: >60 mL/min (ref >60)
Glucose, Bld: 103 mg/dL — ABNORMAL HIGH (ref 70–99)
Potassium: 4 mmol/L (ref 3.5–5.1)
Sodium: 144 mmol/L (ref 135–145)

## 2022-04-25 LAB — CBC
HCT: 34.5 % — ABNORMAL LOW (ref 36.0–46.0)
Hemoglobin: 11.3 g/dL — ABNORMAL LOW (ref 12.0–15.0)
MCH: 26.3 pg (ref 26.0–34.0)
MCHC: 32.8 g/dL (ref 30.0–36.0)
MCV: 80.2 fL (ref 80.0–100.0)
Platelets: 334 10*3/uL (ref 150–400)
RBC: 4.3 MIL/uL (ref 3.87–5.11)
RDW: 13.6 % (ref 11.5–15.5)
WBC: 7.6 10*3/uL (ref 4.0–10.5)
nRBC: 0 % (ref 0.0–0.2)

## 2022-04-25 LAB — I-STAT BETA HCG BLOOD, ED (MC, WL, AP ONLY): I-stat hCG, quantitative: <5 m[IU]/mL (ref <5)

## 2022-04-25 LAB — TROPONIN I (HIGH SENSITIVITY): Troponin I (High Sensitivity): <2 ng/L (ref <18)

## 2022-04-25 NOTE — ED Triage Notes (Signed)
Patient coming to ED for evaluation of chest pain and shortness of breath.  Reports symptoms started today.  Has pain that radiates into L arm.  States it feels "like it is going numb."

## 2022-04-25 NOTE — ED Provider Triage Note (Signed)
Emergency Medicine Provider Triage Evaluation Note  [name] , a 51 y.o. female  was evaluated in triage.  Pt complains of chest pain and shortness of breath.  Patient states that the symptoms began today.  She notes some left upper back pain that began this past Sunday but then progressed to radiation down her left arm.  She notes some left arm numbness as well.  Denies any trauma to affected area.  Denies fever, chills, night sweats, abdominal pain, nausea, vomiting, urinary/vaginal symptoms.  Review of Systems  Positive: See above Negative:   Physical Exam  BP 130/88 (BP Location: Right Arm)   Pulse 82   Temp 97.8 F (36.6 C) (Oral)   Resp (!) 30   Ht '5\' 1"'$  (1.549 m)   Wt 88 kg   LMP 03/28/2016   SpO2 100%   BMI 36.66 kg/m  Gen:   Awake, no distress   Resp:  Normal effort  MSK:   Moves extremities without difficulty  Other:  Patient diffusely tender to palpation and left thoracic paraspinal region.  She is tender to palpation along left upper extremity as well as left anterior chest wall.  No overlying skin abnormalities noted.  Medical Decision Making  Medically screening exam initiated at 10:28 PM.  Appropriate orders placed.  Tamitha DALINA SAMARA was informed that the remainder of the evaluation will be completed by another provider, this initial triage assessment does not replace that evaluation, and the importance of remaining in the ED until their evaluation is complete.     Wilnette Kales, [place] 04/25/22 2231

## 2022-04-26 NOTE — ED Notes (Signed)
Pt called x1 in ED lobby. Pt could not be located within ED lobby, and did not respond to name call.   °

## 2022-04-26 NOTE — ED Notes (Signed)
Pt called x3 in ED lobby. Currently not present and did not respond to name call at noted times, triage RN notified. Pt could not be located within ED lobby.  °

## 2022-04-27 ENCOUNTER — Other Ambulatory Visit (HOSPITAL_COMMUNITY): Payer: Self-pay

## 2022-04-30 ENCOUNTER — Other Ambulatory Visit (HOSPITAL_COMMUNITY): Payer: Self-pay

## 2022-05-01 ENCOUNTER — Other Ambulatory Visit (HOSPITAL_COMMUNITY): Payer: Self-pay

## 2022-05-01 MED ORDER — MELOXICAM 7.5 MG PO TABS
15.0000 mg | ORAL_TABLET | Freq: Every day | ORAL | 0 refills | Status: DC | PRN
  Filled 2022-05-01 – 2022-08-02 (×2): qty 14, 7d supply, fill #0

## 2022-05-02 ENCOUNTER — Other Ambulatory Visit (HOSPITAL_COMMUNITY): Payer: Self-pay

## 2022-05-07 ENCOUNTER — Other Ambulatory Visit: Payer: Self-pay

## 2022-05-07 ENCOUNTER — Other Ambulatory Visit: Payer: Self-pay | Admitting: Family Medicine

## 2022-05-07 DIAGNOSIS — M5010 Cervical disc disorder with radiculopathy, unspecified cervical region: Secondary | ICD-10-CM

## 2022-05-07 DIAGNOSIS — M48062 Spinal stenosis, lumbar region with neurogenic claudication: Secondary | ICD-10-CM

## 2022-05-07 NOTE — Telephone Encounter (Signed)
Medication Refill - Medication: cyclobenzaprine (FLEXERIL) 10 MG tablet  Has the patient contacted their pharmacy? Yes.   Pt told to contact provider  Preferred Pharmacy (with phone number or street name):  [id number] [facility]Phone:  [phone] Fax:  [phone]    Has the patient been seen for an appointment in the last year OR does the patient have an upcoming appointment? Yes.    Agent: Please be advised that RX refills may take up to 3 business days. We ask that you follow-up with your pharmacy.

## 2022-05-08 NOTE — Telephone Encounter (Signed)
Requested medications are due for refill today.  unsure  Requested medications are on the active medications list.  no  Last refill. 12/31/2018 #60 3 refills  Future visit scheduled.   yes  Notes to clinic.  Medication was discontinued 03/11/2019. Refill not delegated.    Requested Prescriptions  Pending Prescriptions Disp Refills   cyclobenzaprine (FLEXERIL) 10 MG tablet 60 tablet 3    Sig: TAKE 1 TABLET BY MOUTH 2 TIMES DAILY AS NEEDED FOR MUSCLE SPASMS.     Not Delegated - Analgesics:  Muscle Relaxants Failed - 05/07/2022  3:53 PM      Failed - This refill cannot be delegated      Passed - Valid encounter within last 6 months    Recent Outpatient Visits           3 months ago Spinal stenosis, lumbar region, with neurogenic claudication   [place] Charlott Rakes, MD   6 months ago Screening for cervical cancer   [place], Charlane Ferretti, MD   8 months ago Spinal stenosis, lumbar region, with neurogenic claudication   [place], [place], MD   1 year ago Spinal stenosis, lumbar region, with neurogenic claudication   [place] Charlott Rakes, MD   1 year ago Anxiety and depression   [place], Enobong, MD       Future Appointments             In 3 months Charlott Rakes, MD [place]

## 2022-05-09 ENCOUNTER — Other Ambulatory Visit (HOSPITAL_COMMUNITY): Payer: Self-pay

## 2022-05-09 MED ORDER — CYCLOBENZAPRINE HCL 10 MG PO TABS: ORAL_TABLET | ORAL | 3 refills | Status: DC

## 2022-05-11 ENCOUNTER — Other Ambulatory Visit (HOSPITAL_COMMUNITY): Payer: Self-pay

## 2022-05-11 MED ORDER — OXYCODONE-ACETAMINOPHEN 10-325 MG PO TABS: ORAL_TABLET | ORAL | 0 refills | Status: DC

## 2022-05-17 ENCOUNTER — Other Ambulatory Visit (HOSPITAL_COMMUNITY): Payer: Self-pay

## 2022-08-02 ENCOUNTER — Other Ambulatory Visit: Payer: Self-pay | Admitting: Family Medicine

## 2022-08-03 ENCOUNTER — Other Ambulatory Visit: Payer: Self-pay

## 2022-08-03 ENCOUNTER — Other Ambulatory Visit: Payer: Self-pay | Admitting: Pharmacist

## 2022-08-03 MED ORDER — ALBUTEROL SULFATE HFA 108 (90 BASE) MCG/ACT IN AERS
2.0000 | INHALATION_SPRAY | Freq: Four times a day (QID) | RESPIRATORY_TRACT | 2 refills | Status: DC | PRN
  Filled 2022-08-03: qty 8.5, 25d supply, fill #0

## 2022-08-03 MED ORDER — ALBUTEROL SULFATE HFA 108 (90 BASE) MCG/ACT IN AERS
2.0000 | INHALATION_SPRAY | Freq: Four times a day (QID) | RESPIRATORY_TRACT | 0 refills | Status: DC | PRN
  Filled 2022-08-03: qty 8.5, 12d supply, fill #0
  Filled 2022-08-03: qty 8.5, 25d supply, fill #0

## 2022-08-03 MED ORDER — FUROSEMIDE 20 MG PO TABS
20.0000 mg | ORAL_TABLET | Freq: Every day | ORAL | 0 refills | Status: DC | PRN
  Filled 2022-08-03: qty 30, 30d supply, fill #0

## 2022-08-06 ENCOUNTER — Encounter: Payer: Self-pay | Admitting: Family Medicine

## 2022-08-06 ENCOUNTER — Other Ambulatory Visit: Payer: Self-pay

## 2022-08-06 ENCOUNTER — Ambulatory Visit: Payer: Commercial Managed Care - HMO | Attending: Family Medicine | Admitting: Family Medicine

## 2022-08-06 VITALS — BP 118/83 | HR 56 | Wt 207.0 lb

## 2022-08-06 DIAGNOSIS — J4520 Mild intermittent asthma, uncomplicated: Secondary | ICD-10-CM

## 2022-08-06 DIAGNOSIS — Z23 Encounter for immunization: Secondary | ICD-10-CM | POA: Diagnosis not present

## 2022-08-06 DIAGNOSIS — R600 Localized edema: Secondary | ICD-10-CM

## 2022-08-06 DIAGNOSIS — F419 Anxiety disorder, unspecified: Secondary | ICD-10-CM

## 2022-08-06 DIAGNOSIS — Z1231 Encounter for screening mammogram for malignant neoplasm of breast: Secondary | ICD-10-CM

## 2022-08-06 DIAGNOSIS — N951 Menopausal and female climacteric states: Secondary | ICD-10-CM

## 2022-08-06 DIAGNOSIS — F32A Depression, unspecified: Secondary | ICD-10-CM | POA: Diagnosis not present

## 2022-08-06 DIAGNOSIS — M48062 Spinal stenosis, lumbar region with neurogenic claudication: Secondary | ICD-10-CM

## 2022-08-06 DIAGNOSIS — M5010 Cervical disc disorder with radiculopathy, unspecified cervical region: Secondary | ICD-10-CM

## 2022-08-06 MED ORDER — CYCLOBENZAPRINE HCL 10 MG PO TABS
10.0000 mg | ORAL_TABLET | Freq: Two times a day (BID) | ORAL | 3 refills | Status: DC
  Filled 2022-08-06: qty 60, 30d supply, fill #0

## 2022-08-06 MED ORDER — FUROSEMIDE 20 MG PO TABS: 20.0000 mg | ORAL_TABLET | Freq: Every day | ORAL | 1 refills | Status: DC | PRN

## 2022-08-06 MED ORDER — CLONIDINE HCL 0.2 MG PO TABS: ORAL_TABLET | ORAL | 1 refills | Status: DC

## 2022-08-06 MED ORDER — FLUTICASONE-SALMETEROL 500-50 MCG/ACT IN AEPB
1.0000 | INHALATION_SPRAY | Freq: Two times a day (BID) | RESPIRATORY_TRACT | 1 refills | Status: DC
  Filled 2022-08-06: qty 60, 30d supply, fill #0

## 2022-08-06 MED ORDER — ALBUTEROL SULFATE HFA 108 (90 BASE) MCG/ACT IN AERS
2.0000 | INHALATION_SPRAY | Freq: Four times a day (QID) | RESPIRATORY_TRACT | 2 refills | Status: DC | PRN
  Filled 2023-02-04: qty 18, 25d supply, fill #0
  Filled 2023-06-26: qty 18, 25d supply, fill #1

## 2022-08-06 NOTE — Patient Instructions (Signed)
Calorie Counting for Weight Loss Calories are units of energy. Your body needs a certain number of calories from food to keep going throughout the day. When you eat or drink more calories than your body needs, your body stores the extra calories mostly as fat. When you eat or drink fewer calories than your body needs, your body burns fat to get the energy it needs. Calorie counting means keeping track of how many calories you eat and drink each day. Calorie counting can be helpful if you need to lose weight. If you eat fewer calories than your body needs, you should lose weight. Ask your health care provider what a healthy weight is for you. For calorie counting to work, you will need to eat the right number of calories each day to lose a healthy amount of weight per week. A dietitian can help you figure out how many calories you need in a day and will suggest ways to reach your calorie goal. A healthy amount of weight to lose each week is usually 1-2 lb (0.5-0.9 kg). This usually means that your daily calorie intake should be reduced by 500-750 calories. Eating 1,200-1,500 calories a day can help most women lose weight. Eating 1,500-1,800 calories a day can help most men lose weight. What do I need to know about calorie counting? Work with your health care provider or dietitian to determine how many calories you should get each day. To meet your daily calorie goal, you will need to: Find out how many calories are in each food that you would like to eat. Try to do this before you eat. Decide how much of the food you plan to eat. Keep a food log. Do this by writing down what you ate and how many calories it had. To successfully lose weight, it is important to balance calorie counting with a healthy lifestyle that includes regular activity. Where do I find calorie information?  The number of calories in a food can be found on a Nutrition Facts label. If a food does not have a Nutrition Facts label, try  to look up the calories online or ask your dietitian for help. Remember that calories are listed per serving. If you choose to have more than one serving of a food, you will have to multiply the calories per serving by the number of servings you plan to eat. For example, the label on a package of bread might say that a serving size is 1 slice and that there are 90 calories in a serving. If you eat 1 slice, you will have eaten 90 calories. If you eat 2 slices, you will have eaten 180 calories. How do I keep a food log? After each time that you eat, record the following in your food log as soon as possible: What you ate. Be sure to include toppings, sauces, and other extras on the food. How much you ate. This can be measured in cups, ounces, or number of items. How many calories were in each food and drink. The total number of calories in the food you ate. Keep your food log near you, such as in a pocket-sized notebook or on an app or website on your mobile phone. Some programs will calculate calories for you and show you how many calories you have left to meet your daily goal. What are some portion-control tips? Know how many calories are in a serving. This will help you know how many servings you can have of a certain   food. Use a measuring cup to measure serving sizes. You could also try weighing out portions on a kitchen scale. With time, you will be able to estimate serving sizes for some foods. Take time to put servings of different foods on your favorite plates or in your favorite bowls and cups so you know what a serving looks like. Try not to eat straight from a food's packaging, such as from a bag or box. Eating straight from the package makes it hard to see how much you are eating and can lead to overeating. Put the amount you would like to eat in a cup or on a plate to make sure you are eating the right portion. Use smaller plates, glasses, and bowls for smaller portions and to prevent  overeating. Try not to multitask. For example, avoid watching TV or using your computer while eating. If it is time to eat, sit down at a table and enjoy your food. This will help you recognize when you are full. It will also help you be more mindful of what and how much you are eating. What are tips for following this plan? Reading food labels Check the calorie count compared with the serving size. The serving size may be smaller than what you are used to eating. Check the source of the calories. Try to choose foods that are high in protein, fiber, and vitamins, and low in saturated fat, trans fat, and sodium. Shopping Read nutrition labels while you shop. This will help you make healthy decisions about which foods to buy. Pay attention to nutrition labels for low-fat or fat-free foods. These foods sometimes have the same number of calories or more calories than the full-fat versions. They also often have added sugar, starch, or salt to make up for flavor that was removed with the fat. Make a grocery list of lower-calorie foods and stick to it. Cooking Try to cook your favorite foods in a healthier way. For example, try baking instead of frying. Use low-fat dairy products. Meal planning Use more fruits and vegetables. One-half of your plate should be fruits and vegetables. Include lean proteins, such as chicken, turkey, and fish. Lifestyle Each week, aim to do one of the following: 150 minutes of moderate exercise, such as walking. 75 minutes of vigorous exercise, such as running. General information Know how many calories are in the foods you eat most often. This will help you calculate calorie counts faster. Find a way of tracking calories that works for you. Get creative. Try different apps or programs if writing down calories does not work for you. What foods should I eat?  Eat nutritious foods. It is better to have a nutritious, high-calorie food, such as an avocado, than a food with  few nutrients, such as a bag of potato chips. Use your calories on foods and drinks that will fill you up and will not leave you hungry soon after eating. Examples of foods that fill you up are nuts and nut butters, vegetables, lean proteins, and high-fiber foods such as whole grains. High-fiber foods are foods with more than 5 g of fiber per serving. Pay attention to calories in drinks. Low-calorie drinks include water and unsweetened drinks. The items listed above may not be a complete list of foods and beverages you can eat. Contact a dietitian for more information. What foods should I limit? Limit foods or drinks that are not good sources of vitamins, minerals, or protein or that are high in unhealthy fats. These   include: Candy. Other sweets. Sodas, specialty coffee drinks, alcohol, and juice. The items listed above may not be a complete list of foods and beverages you should avoid. Contact a dietitian for more information. How do I count calories when eating out? Pay attention to portions. Often, portions are much larger when eating out. Try these tips to keep portions smaller: Consider sharing a meal instead of getting your own. If you get your own meal, eat only half of it. Before you start eating, ask for a container and put half of your meal into it. When available, consider ordering smaller portions from the menu instead of full portions. Pay attention to your food and drink choices. Knowing the way food is cooked and what is included with the meal can help you eat fewer calories. If calories are listed on the menu, choose the lower-calorie options. Choose dishes that include vegetables, fruits, whole grains, low-fat dairy products, and lean proteins. Choose items that are boiled, broiled, grilled, or steamed. Avoid items that are buttered, battered, fried, or served with cream sauce. Items labeled as crispy are usually fried, unless stated otherwise. Choose water, low-fat milk,  unsweetened iced tea, or other drinks without added sugar. If you want an alcoholic beverage, choose a lower-calorie option, such as a glass of wine or light beer. Ask for dressings, sauces, and syrups on the side. These are usually high in calories, so you should limit the amount you eat. If you want a salad, choose a garden salad and ask for grilled meats. Avoid extra toppings such as bacon, cheese, or fried items. Ask for the dressing on the side, or ask for olive oil and vinegar or lemon to use as dressing. Estimate how many servings of a food you are given. Knowing serving sizes will help you be aware of how much food you are eating at restaurants. Where to find more information Centers for Disease Control and Prevention: www.cdc.gov U.[place]. Department of Agriculture: myplate.gov Summary Calorie counting means keeping track of how many calories you eat and drink each day. If you eat fewer calories than your body needs, you should lose weight. A healthy amount of weight to lose per week is usually 1-2 lb (0.5-0.9 kg). This usually means reducing your daily calorie intake by 500-750 calories. The number of calories in a food can be found on a Nutrition Facts label. If a food does not have a Nutrition Facts label, try to look up the calories online or ask your dietitian for help. Use smaller plates, glasses, and bowls for smaller portions and to prevent overeating. Use your calories on foods and drinks that will fill you up and not leave you hungry shortly after a meal. This information is not intended to replace advice given to you by your health care provider. Make sure you discuss any questions you have with your health care provider. Document Revised: 10/15/2019 Document Reviewed: 10/15/2019 Elsevier Patient Education  2023 [employer]  

## 2022-08-06 NOTE — Progress Notes (Signed)
Subjective:  Patient ID: [name], female    DOB: 08-23-1971  Age: 51 y.o. MRN: [id number]  CC: Medication Refill   HPI [name] is a 51 y.o. year old female with a history of asthma, hypertension, depression, chronic low back pain secondary to spinal stenosis, degenerative disc disease of the thoracic spine, cervical stenosis, hot flashes, and chronic constipation  Pain is managed by [facility].  Interval History: Her depression is controlled and she is under the care of psychiatry at [facility].  With regards to her asthma she has had no recent flares and remains on her ICS/LABA.  She is unable to work due to the fact that she has pain in her back and her legs give out on her.  Pain radiates down her lower extremities bilaterally and she ambulates with a cane.  She has a Physiological scientist x2 hours 7 days a week. Back pain is 8/10 and her narcotics provide some relief. Requested letter so she can obtain food stamps. She denies additional concerns.  Her major form of exercise is when she goes walking with her daughter. Past Medical History:  Diagnosis Date   Allergy    pollen, pcn   Anxiety    Asthma    Cataract    COPD (chronic obstructive pulmonary disease) (HCC)    Depression    Fibroids    Glaucoma    History of ovarian cyst    Medical history non-contributory    sickle cell   Menometrorrhagia    Sickle cell anemia (HCC)    has the trait   Sickle cell trait ([place])     Past Surgical History:  Procedure Laterality Date   CESAREAN SECTION     all three pregnancies   COLONOSCOPY  07/04/2021    Family History  Problem Relation Age of Onset   Cancer Mother        colon cancer   Colon polyps Mother    Hypertension Mother    Heart disease Father    Cancer Father        cancer   Other Neg Hx    Esophageal cancer Neg Hx    Rectal cancer Neg Hx    Stomach cancer Neg Hx     Social History   Socioeconomic History   Marital status: Divorced     Spouse name: Not on file   Number of children: Not on file   Years of education: Not on file   Highest education level: Not on file  Occupational History   Not on file  Tobacco Use   Smoking status: Former    Packs/day: 0.25    Types: Cigarettes    Quit date: 02/14/2016    Years since quitting: 6.4   Smokeless tobacco: Not on file   Tobacco comments:    3 cigarettes daily 06-22-21  Vaping Use   Vaping Use: Never used  Substance and Sexual Activity   Alcohol use: No   Drug use: No   Sexual activity: Yes    Partners: Male    Birth control/protection: I.U.D., None  Other Topics Concern   Not on file  Social History Narrative   Not on file   Social Determinants of Health   Financial Resource Strain: Not on file  Food Insecurity: Not on file  Transportation Needs: Not on file  Physical Activity: Not on file  Stress: Not on file  Social Connections: Not on file    Allergies  Allergen Reactions  Bupropion Hcl Er (Sr) Hives and Other (See Comments)    The patient has an entire page of reactions that can be caused by the medication, but none that she has suffered.   Fruit & Vegetable Daily [Nutritional Supplements] Other (See Comments)    "all fruits causes bumps and weird feeling in her mouth"   Penicillins Hives   Pollen Extract-Tree Extract Other (See Comments)    Allergies   Other Rash    "bugs" roaches    Outpatient Medications Prior to Visit  Medication Sig Dispense Refill   busPIRone (BUSPAR) 15 MG tablet Take 1 tablet by mouth 2 times per day 60 tablet 2   cariprazine (VRAYLAR) 1.5 MG capsule Take 2 capsules by mouth daily. 60 capsule 2   clotrimazole (LOTRIMIN) 1 % cream Apply 1 application topically 2 (two) times daily. 45 g 1   hydrOXYzine (ATARAX) 10 MG tablet TAKE 1 TABLET (10 MG TOTAL) BY MOUTH 3 (THREE) TIMES DAILY AS NEEDED. 30 tablet 6   ibuprofen (ADVIL) 800 MG tablet Take 1 tablet (800 mg total) by mouth 3 (three) times daily. 21 tablet 0    lactulose, encephalopathy, (CHRONULAC) 10 GM/15ML SOLN Take 15 mLs (10 g total) by mouth 2 (two) times daily as needed for mild constipation. 946 mL 1   levocetirizine (XYZAL) 5 MG tablet Take 1 tablet (5 mg total) by mouth every evening. 30 tablet 2   lidocaine (LIDODERM) 5 % Place 1 patch onto the skin daily. Remove & Discard patch within 12 hours or as directed by MD 30 patch 3   meloxicam (MOBIC) 7.5 MG tablet Take 2 tablets (15 mg total) by mouth daily as needed. 14 tablet 0   Misc. Devices MISC 4-pronged cane.  Diagnosis-spinal stenosis 1 each 0   Misc. Devices MISC Rollator walker with seat.  Diagnosis spinal stenosis 1 each 0   naloxone (NARCAN) nasal spray 4 mg/0.1 mL 1 (ONE) SPRAY BY NOSE AS NEEDED, IF FOUND UNRESPONSIVE THEN SPRAY INTO NOSE AND CALL 911     oxyCODONE-acetaminophen (PERCOCET) 10-325 MG tablet Take 1 tablet by mouth 3 (three) times daily as needed. Takes 4 times daily     oxyCODONE-acetaminophen (PERCOCET) 10-325 MG tablet Take 1 tablet by mouth 4 (four) times daily as needed. 120 tablet 0   prazosin (MINIPRESS) 1 MG capsule Take 1 capsule by mouth at bedtime for nightmares 30 capsule 2   predniSONE (DELTASONE) 20 MG tablet Take 1 tablet (20 mg total) by mouth daily with breakfast. 5 tablet 0   triamcinolone cream (KENALOG) 0.1 % Apply 1 application topically daily to arm for eczema 15 g 0   varenicline (CHANTIX) 0.5 MG tablet Take 0.5 mg by mouth 3 (three) times daily.     albuterol (PROAIR HFA) 108 (90 Base) MCG/ACT inhaler Inhale 2 puffs into the lungs every 6 (six) hours as needed for wheezing or shortness of breath. 8.5 g 2   cloNIDine (CATAPRES) 0.2 MG tablet TAKE 1 TABLET (0.2 MG TOTAL) BY MOUTH AT BEDTIME. 90 tablet 1   cyclobenzaprine (FLEXERIL) 10 MG tablet TAKE 1 TABLET BY MOUTH 2 TIMES DAILY AS NEEDED FOR MUSCLE SPASMS. 60 tablet 3   fluticasone-salmeterol (ADVAIR DISKUS) 500-50 MCG/ACT AEPB Inhale 1 puff into the lungs 2 (two) times daily. 180 each 1    furosemide (LASIX) 20 MG tablet Take 1 tablet (20 mg total) by mouth daily as needed. 30 tablet 0   DULoxetine (CYMBALTA) 60 MG capsule TAKE 1 CAPSULE (60 MG  TOTAL) BY MOUTH DAILY. 30 capsule 6   gabapentin (NEURONTIN) 300 MG capsule TAKE 2 CAPSULES (600 MG TOTAL) BY MOUTH 2 (TWO) TIMES DAILY. 120 capsule 6   traZODone (DESYREL) 50 MG tablet TAKE 1 TABLET (50 MG TOTAL) BY MOUTH AT BEDTIME AS NEEDED. FOR SLEEP 30 tablet 6   No facility-administered medications prior to visit.     ROS Review of Systems  Constitutional:  Negative for activity change and appetite change.  HENT:  Negative for sinus pressure and sore throat.   Respiratory:  Negative for chest tightness, shortness of breath and wheezing.   Cardiovascular:  Negative for chest pain and palpitations.  Gastrointestinal:  Negative for abdominal distention, abdominal pain and constipation.  Genitourinary: Negative.   Musculoskeletal:        See HPI  Psychiatric/Behavioral:  Negative for behavioral problems and dysphoric mood.     Objective:  BP 118/83 (BP Location: Left Arm, Patient Position: Sitting, Cuff Size: Large)   Pulse (!) 56   Wt 207 lb (93.9 kg)   LMP 03/28/2016   SpO2 97%   BMI 39.11 kg/m      08/06/2022    1:54 PM 04/25/2022    9:23 PM 01/31/2022    2:00 PM  BP/Weight  Systolic BP 510 258 527  Diastolic BP 83 88 74  Wt. (Lbs) 207 194 202.2  BMI 39.11 kg/m2 36.66 kg/m2 38.21 kg/m2      Physical Exam Constitutional:      Appearance: She is well-developed.  HENT:     Mouth/Throat:     Comments: Edentulous Cardiovascular:     Rate and Rhythm: Normal rate.     Heart sounds: Normal heart sounds. No murmur heard. Pulmonary:     Effort: Pulmonary effort is normal.     Breath sounds: Normal breath sounds. No wheezing or rales.  Chest:     Chest wall: No tenderness.  Abdominal:     General: Bowel sounds are normal. There is no distension.     Palpations: Abdomen is soft. There is no mass.      Tenderness: There is no abdominal tenderness.  Musculoskeletal:        General: Tenderness (mid lumbar spine) present.     Right lower leg: No edema.     Left lower leg: No edema.     Comments: Positive straight leg raise bilaterally  Neurological:     Mental Status: She is alert and oriented to person, place, and time.  Psychiatric:        Mood and Affect: Mood normal.        Latest Ref Rng & Units 04/25/2022    9:56 PM 01/31/2022    2:28 PM 02/09/2021   10:24 AM  CMP  Glucose 70 - 99 mg/dL 103  75  84   BUN 6 - 20 mg/dL '13  11  8   '$ Creatinine 0.44 - 1.00 mg/dL 1.04  0.87  0.90   Sodium 135 - 145 mmol/L 144  142  140   Potassium 3.5 - 5.1 mmol/L 4.0  4.4  4.4   Chloride 98 - 111 mmol/L 109  104  101   CO2 22 - 32 mmol/L '28  24  24   '$ Calcium 8.9 - 10.3 mg/dL 9.2  8.9  9.5   Total Protein 6.0 - 8.5 g/dL  6.7  7.0   Total Bilirubin 0.0 - 1.2 mg/dL  0.5  0.6   Alkaline Phos 44 - 121 IU/L  106  106   AST 0 - 40 IU/L  15  12   ALT 0 - 32 IU/L  12  9     Lipid Panel     Component Value Date/Time   CHOL 166 01/31/2022 1428   TRIG 72 01/31/2022 1428   HDL 50 01/31/2022 1428   CHOLHDL 4.3 02/09/2021 1024   CHOLHDL 3.0 05/15/2016 0949   VLDL 13 05/15/2016 0949   LDLCALC 102 (H) 01/31/2022 1428    CBC    Component Value Date/Time   WBC 7.6 04/25/2022 2156   RBC 4.30 04/25/2022 2156   HGB 11.3 (L) 04/25/2022 2156   HGB 11.5 01/31/2022 1428   HCT 34.5 (L) 04/25/2022 2156   HCT 35.5 01/31/2022 1428   PLT 334 04/25/2022 2156   PLT 344 01/31/2022 1428   MCV 80.2 04/25/2022 2156   MCV 78 (L) 01/31/2022 1428   MCH 26.3 04/25/2022 2156   MCHC 32.8 04/25/2022 2156   RDW 13.6 04/25/2022 2156   RDW 12.9 01/31/2022 1428   LYMPHSABS 2.0 01/31/2022 1428   MONOABS 0.4 06/26/2010 2057   EOSABS 0.1 01/31/2022 1428   BASOSABS 0.0 01/31/2022 1428    Lab Results  Component Value Date   HGBA1C 4.9 01/31/2022    Assessment & Plan:  1. Vasomotor symptoms due to  menopause Controlled - cloNIDine (CATAPRES) 0.2 MG tablet; TAKE 1 TABLET (0.2 MG TOTAL) BY MOUTH AT BEDTIME.  Dispense: 90 tablet; Refill: 1  2. Spinal stenosis, lumbar region, with neurogenic claudication Stable with intermittent flares Weight loss will be beneficial Continue narcotic analgesics per pain management - cyclobenzaprine (FLEXERIL) 10 MG tablet; TAKE 1 TABLET BY MOUTH 2 TIMES DAILY AS NEEDED FOR MUSCLE SPASMS.  Dispense: 60 tablet; Refill: 3  3. Cervical disc disorder with radiculopathy of cervical region See #2 above I have provided her with a letter for food stamps as requested. - cyclobenzaprine (FLEXERIL) 10 MG tablet; TAKE 1 TABLET BY MOUTH 2 TIMES DAILY AS NEEDED FOR MUSCLE SPASMS.  Dispense: 60 tablet; Refill: 3  4. Anxiety and depression Controlled Currently on Vraylar, Cymbalta, hydroxyzine, trazodone per psychiatry at [facility]  5. Mild intermittent asthma without complication Controlled with no recent flares - albuterol (PROAIR HFA) 108 (90 Base) MCG/ACT inhaler; Inhale 2 puffs into the lungs every 6 (six) hours as needed for wheezing or shortness of breath.  Dispense: 8.5 g; Refill: 2 - fluticasone-salmeterol (ADVAIR DISKUS) 500-50 MCG/ACT AEPB; Inhale 1 puff into the lungs 2 (two) times daily.  Dispense: 180 each; Refill: 1  6. Pedal edema She uses this intermittently Pedal edema is absent at the moment - furosemide (LASIX) 20 MG tablet; Take 1 tablet (20 mg total) by mouth daily as needed.  Dispense: 30 tablet; Refill: 1   Health Care Maintenance: Referred for mammogram Meds ordered this encounter  Medications   albuterol (PROAIR HFA) 108 (90 Base) MCG/ACT inhaler    Sig: Inhale 2 puffs into the lungs every 6 (six) hours as needed for wheezing or shortness of breath.    Dispense:  8.5 g    Refill:  2   cloNIDine (CATAPRES) 0.2 MG tablet    Sig: TAKE 1 TABLET (0.2 MG TOTAL) BY MOUTH AT BEDTIME.    Dispense:  90 tablet    Refill:  1   cyclobenzaprine  (FLEXERIL) 10 MG tablet    Sig: TAKE 1 TABLET BY MOUTH 2 TIMES DAILY AS NEEDED FOR MUSCLE SPASMS.    Dispense:  60 tablet  Refill:  3   fluticasone-salmeterol (ADVAIR DISKUS) 500-50 MCG/ACT AEPB    Sig: Inhale 1 puff into the lungs 2 (two) times daily.    Dispense:  180 each    Refill:  1   furosemide (LASIX) 20 MG tablet    Sig: Take 1 tablet (20 mg total) by mouth daily as needed.    Dispense:  30 tablet    Refill:  1    Follow-up: Return in about 6 months (around 02/04/2023) for Chronic medical conditions.       Charlott Rakes, MD, FAAFP. [facility], [place]   08/06/2022, 2:15 PM

## 2022-10-17 ENCOUNTER — Ambulatory Visit
Admission: RE | Admit: 2022-10-17 | Discharge: 2022-10-17 | Disposition: A | Discharge status: Home / Self Care | Payer: Medicaid Other | Source: Ambulatory Visit | Attending: Family Medicine | Admitting: Family Medicine

## 2022-10-17 DIAGNOSIS — Z1231 Encounter for screening mammogram for malignant neoplasm of breast: Secondary | ICD-10-CM

## 2022-10-23 DIAGNOSIS — M129 Arthropathy, unspecified: Secondary | ICD-10-CM | POA: Diagnosis not present

## 2022-10-23 DIAGNOSIS — R5383 Other fatigue: Secondary | ICD-10-CM | POA: Diagnosis not present

## 2023-01-14 DIAGNOSIS — M48062 Spinal stenosis, lumbar region with neurogenic claudication: Secondary | ICD-10-CM | POA: Diagnosis not present

## 2023-01-14 DIAGNOSIS — M5010 Cervical disc disorder with radiculopathy, unspecified cervical region: Secondary | ICD-10-CM | POA: Diagnosis not present

## 2023-01-14 DIAGNOSIS — G629 Polyneuropathy, unspecified: Secondary | ICD-10-CM | POA: Diagnosis not present

## 2023-01-15 DIAGNOSIS — M5010 Cervical disc disorder with radiculopathy, unspecified cervical region: Secondary | ICD-10-CM | POA: Diagnosis not present

## 2023-01-15 DIAGNOSIS — M48062 Spinal stenosis, lumbar region with neurogenic claudication: Secondary | ICD-10-CM | POA: Diagnosis not present

## 2023-01-15 DIAGNOSIS — G629 Polyneuropathy, unspecified: Secondary | ICD-10-CM | POA: Diagnosis not present

## 2023-01-16 DIAGNOSIS — M5010 Cervical disc disorder with radiculopathy, unspecified cervical region: Secondary | ICD-10-CM | POA: Diagnosis not present

## 2023-01-16 DIAGNOSIS — G629 Polyneuropathy, unspecified: Secondary | ICD-10-CM | POA: Diagnosis not present

## 2023-01-16 DIAGNOSIS — M48062 Spinal stenosis, lumbar region with neurogenic claudication: Secondary | ICD-10-CM | POA: Diagnosis not present

## 2023-01-17 DIAGNOSIS — Z791 Long term (current) use of non-steroidal anti-inflammatories (NSAID): Secondary | ICD-10-CM | POA: Diagnosis not present

## 2023-01-17 DIAGNOSIS — H409 Unspecified glaucoma: Secondary | ICD-10-CM | POA: Diagnosis not present

## 2023-01-17 DIAGNOSIS — M48062 Spinal stenosis, lumbar region with neurogenic claudication: Secondary | ICD-10-CM | POA: Diagnosis not present

## 2023-01-17 DIAGNOSIS — G629 Polyneuropathy, unspecified: Secondary | ICD-10-CM | POA: Diagnosis not present

## 2023-01-17 DIAGNOSIS — Z880 Allergy status to penicillin: Secondary | ICD-10-CM | POA: Diagnosis not present

## 2023-01-17 DIAGNOSIS — M5010 Cervical disc disorder with radiculopathy, unspecified cervical region: Secondary | ICD-10-CM | POA: Diagnosis not present

## 2023-01-17 DIAGNOSIS — I10 Essential (primary) hypertension: Secondary | ICD-10-CM | POA: Diagnosis not present

## 2023-01-17 DIAGNOSIS — Z87891 Personal history of nicotine dependence: Secondary | ICD-10-CM | POA: Diagnosis not present

## 2023-01-17 DIAGNOSIS — Z8249 Family history of ischemic heart disease and other diseases of the circulatory system: Secondary | ICD-10-CM | POA: Diagnosis not present

## 2023-01-17 DIAGNOSIS — M1990 Unspecified osteoarthritis, unspecified site: Secondary | ICD-10-CM | POA: Diagnosis not present

## 2023-01-17 DIAGNOSIS — J45909 Unspecified asthma, uncomplicated: Secondary | ICD-10-CM | POA: Diagnosis not present

## 2023-01-17 DIAGNOSIS — F209 Schizophrenia, unspecified: Secondary | ICD-10-CM | POA: Diagnosis not present

## 2023-01-17 DIAGNOSIS — K5900 Constipation, unspecified: Secondary | ICD-10-CM | POA: Diagnosis not present

## 2023-01-17 DIAGNOSIS — Z7951 Long term (current) use of inhaled steroids: Secondary | ICD-10-CM | POA: Diagnosis not present

## 2023-01-18 DIAGNOSIS — M48062 Spinal stenosis, lumbar region with neurogenic claudication: Secondary | ICD-10-CM | POA: Diagnosis not present

## 2023-01-18 DIAGNOSIS — M5010 Cervical disc disorder with radiculopathy, unspecified cervical region: Secondary | ICD-10-CM | POA: Diagnosis not present

## 2023-01-18 DIAGNOSIS — G629 Polyneuropathy, unspecified: Secondary | ICD-10-CM | POA: Diagnosis not present

## 2023-01-19 DIAGNOSIS — M5010 Cervical disc disorder with radiculopathy, unspecified cervical region: Secondary | ICD-10-CM | POA: Diagnosis not present

## 2023-01-19 DIAGNOSIS — G629 Polyneuropathy, unspecified: Secondary | ICD-10-CM | POA: Diagnosis not present

## 2023-01-19 DIAGNOSIS — M48062 Spinal stenosis, lumbar region with neurogenic claudication: Secondary | ICD-10-CM | POA: Diagnosis not present

## 2023-01-20 DIAGNOSIS — G629 Polyneuropathy, unspecified: Secondary | ICD-10-CM | POA: Diagnosis not present

## 2023-01-20 DIAGNOSIS — M48062 Spinal stenosis, lumbar region with neurogenic claudication: Secondary | ICD-10-CM | POA: Diagnosis not present

## 2023-01-20 DIAGNOSIS — M5010 Cervical disc disorder with radiculopathy, unspecified cervical region: Secondary | ICD-10-CM | POA: Diagnosis not present

## 2023-01-21 DIAGNOSIS — G629 Polyneuropathy, unspecified: Secondary | ICD-10-CM | POA: Diagnosis not present

## 2023-01-21 DIAGNOSIS — M5010 Cervical disc disorder with radiculopathy, unspecified cervical region: Secondary | ICD-10-CM | POA: Diagnosis not present

## 2023-01-21 DIAGNOSIS — M48062 Spinal stenosis, lumbar region with neurogenic claudication: Secondary | ICD-10-CM | POA: Diagnosis not present

## 2023-01-22 DIAGNOSIS — G629 Polyneuropathy, unspecified: Secondary | ICD-10-CM | POA: Diagnosis not present

## 2023-01-22 DIAGNOSIS — M25562 Pain in left knee: Secondary | ICD-10-CM | POA: Diagnosis not present

## 2023-01-22 DIAGNOSIS — M5010 Cervical disc disorder with radiculopathy, unspecified cervical region: Secondary | ICD-10-CM | POA: Diagnosis not present

## 2023-01-22 DIAGNOSIS — Z79899 Other long term (current) drug therapy: Secondary | ICD-10-CM | POA: Diagnosis not present

## 2023-01-22 DIAGNOSIS — M25561 Pain in right knee: Secondary | ICD-10-CM | POA: Diagnosis not present

## 2023-01-22 DIAGNOSIS — M545 Low back pain, unspecified: Secondary | ICD-10-CM | POA: Diagnosis not present

## 2023-01-22 DIAGNOSIS — M48062 Spinal stenosis, lumbar region with neurogenic claudication: Secondary | ICD-10-CM | POA: Diagnosis not present

## 2023-01-23 DIAGNOSIS — G629 Polyneuropathy, unspecified: Secondary | ICD-10-CM | POA: Diagnosis not present

## 2023-01-23 DIAGNOSIS — M5010 Cervical disc disorder with radiculopathy, unspecified cervical region: Secondary | ICD-10-CM | POA: Diagnosis not present

## 2023-01-23 DIAGNOSIS — M48062 Spinal stenosis, lumbar region with neurogenic claudication: Secondary | ICD-10-CM | POA: Diagnosis not present

## 2023-01-24 DIAGNOSIS — M5010 Cervical disc disorder with radiculopathy, unspecified cervical region: Secondary | ICD-10-CM | POA: Diagnosis not present

## 2023-01-24 DIAGNOSIS — M48062 Spinal stenosis, lumbar region with neurogenic claudication: Secondary | ICD-10-CM | POA: Diagnosis not present

## 2023-01-24 DIAGNOSIS — G629 Polyneuropathy, unspecified: Secondary | ICD-10-CM | POA: Diagnosis not present

## 2023-01-25 DIAGNOSIS — M5010 Cervical disc disorder with radiculopathy, unspecified cervical region: Secondary | ICD-10-CM | POA: Diagnosis not present

## 2023-01-25 DIAGNOSIS — G629 Polyneuropathy, unspecified: Secondary | ICD-10-CM | POA: Diagnosis not present

## 2023-01-25 DIAGNOSIS — H402212 Chronic angle-closure glaucoma, right eye, moderate stage: Secondary | ICD-10-CM | POA: Diagnosis not present

## 2023-01-25 DIAGNOSIS — M48062 Spinal stenosis, lumbar region with neurogenic claudication: Secondary | ICD-10-CM | POA: Diagnosis not present

## 2023-01-26 DIAGNOSIS — M5010 Cervical disc disorder with radiculopathy, unspecified cervical region: Secondary | ICD-10-CM | POA: Diagnosis not present

## 2023-01-26 DIAGNOSIS — M48062 Spinal stenosis, lumbar region with neurogenic claudication: Secondary | ICD-10-CM | POA: Diagnosis not present

## 2023-01-26 DIAGNOSIS — G629 Polyneuropathy, unspecified: Secondary | ICD-10-CM | POA: Diagnosis not present

## 2023-01-27 DIAGNOSIS — M48062 Spinal stenosis, lumbar region with neurogenic claudication: Secondary | ICD-10-CM | POA: Diagnosis not present

## 2023-01-27 DIAGNOSIS — M5010 Cervical disc disorder with radiculopathy, unspecified cervical region: Secondary | ICD-10-CM | POA: Diagnosis not present

## 2023-01-27 DIAGNOSIS — G629 Polyneuropathy, unspecified: Secondary | ICD-10-CM | POA: Diagnosis not present

## 2023-01-28 DIAGNOSIS — G629 Polyneuropathy, unspecified: Secondary | ICD-10-CM | POA: Diagnosis not present

## 2023-01-28 DIAGNOSIS — M5010 Cervical disc disorder with radiculopathy, unspecified cervical region: Secondary | ICD-10-CM | POA: Diagnosis not present

## 2023-01-28 DIAGNOSIS — F315 Bipolar disorder, current episode depressed, severe, with psychotic features: Secondary | ICD-10-CM | POA: Diagnosis not present

## 2023-01-28 DIAGNOSIS — M48062 Spinal stenosis, lumbar region with neurogenic claudication: Secondary | ICD-10-CM | POA: Diagnosis not present

## 2023-01-29 DIAGNOSIS — M48062 Spinal stenosis, lumbar region with neurogenic claudication: Secondary | ICD-10-CM | POA: Diagnosis not present

## 2023-01-29 DIAGNOSIS — G629 Polyneuropathy, unspecified: Secondary | ICD-10-CM | POA: Diagnosis not present

## 2023-01-29 DIAGNOSIS — M5010 Cervical disc disorder with radiculopathy, unspecified cervical region: Secondary | ICD-10-CM | POA: Diagnosis not present

## 2023-01-30 DIAGNOSIS — M5010 Cervical disc disorder with radiculopathy, unspecified cervical region: Secondary | ICD-10-CM | POA: Diagnosis not present

## 2023-01-30 DIAGNOSIS — G629 Polyneuropathy, unspecified: Secondary | ICD-10-CM | POA: Diagnosis not present

## 2023-01-30 DIAGNOSIS — M48062 Spinal stenosis, lumbar region with neurogenic claudication: Secondary | ICD-10-CM | POA: Diagnosis not present

## 2023-01-31 DIAGNOSIS — M5010 Cervical disc disorder with radiculopathy, unspecified cervical region: Secondary | ICD-10-CM | POA: Diagnosis not present

## 2023-01-31 DIAGNOSIS — M48062 Spinal stenosis, lumbar region with neurogenic claudication: Secondary | ICD-10-CM | POA: Diagnosis not present

## 2023-01-31 DIAGNOSIS — G629 Polyneuropathy, unspecified: Secondary | ICD-10-CM | POA: Diagnosis not present

## 2023-02-01 DIAGNOSIS — G629 Polyneuropathy, unspecified: Secondary | ICD-10-CM | POA: Diagnosis not present

## 2023-02-01 DIAGNOSIS — M5010 Cervical disc disorder with radiculopathy, unspecified cervical region: Secondary | ICD-10-CM | POA: Diagnosis not present

## 2023-02-01 DIAGNOSIS — M48062 Spinal stenosis, lumbar region with neurogenic claudication: Secondary | ICD-10-CM | POA: Diagnosis not present

## 2023-02-02 DIAGNOSIS — M48062 Spinal stenosis, lumbar region with neurogenic claudication: Secondary | ICD-10-CM | POA: Diagnosis not present

## 2023-02-02 DIAGNOSIS — M5010 Cervical disc disorder with radiculopathy, unspecified cervical region: Secondary | ICD-10-CM | POA: Diagnosis not present

## 2023-02-02 DIAGNOSIS — G629 Polyneuropathy, unspecified: Secondary | ICD-10-CM | POA: Diagnosis not present

## 2023-02-03 DIAGNOSIS — G629 Polyneuropathy, unspecified: Secondary | ICD-10-CM | POA: Diagnosis not present

## 2023-02-03 DIAGNOSIS — M48062 Spinal stenosis, lumbar region with neurogenic claudication: Secondary | ICD-10-CM | POA: Diagnosis not present

## 2023-02-03 DIAGNOSIS — M5010 Cervical disc disorder with radiculopathy, unspecified cervical region: Secondary | ICD-10-CM | POA: Diagnosis not present

## 2023-02-04 ENCOUNTER — Telehealth: Payer: Self-pay | Admitting: Licensed Clinical Social Worker

## 2023-02-04 ENCOUNTER — Encounter: Payer: Self-pay | Admitting: Family Medicine

## 2023-02-04 ENCOUNTER — Ambulatory Visit: Payer: 59 | Attending: Family Medicine | Admitting: Family Medicine

## 2023-02-04 ENCOUNTER — Other Ambulatory Visit: Payer: Self-pay

## 2023-02-04 VITALS — BP 120/80 | HR 71 | Temp 99.2°F | Ht 61.0 in | Wt 208.8 lb

## 2023-02-04 DIAGNOSIS — M5010 Cervical disc disorder with radiculopathy, unspecified cervical region: Secondary | ICD-10-CM | POA: Diagnosis not present

## 2023-02-04 DIAGNOSIS — J4520 Mild intermittent asthma, uncomplicated: Secondary | ICD-10-CM

## 2023-02-04 DIAGNOSIS — F32A Depression, unspecified: Secondary | ICD-10-CM | POA: Diagnosis not present

## 2023-02-04 DIAGNOSIS — F419 Anxiety disorder, unspecified: Secondary | ICD-10-CM

## 2023-02-04 DIAGNOSIS — Z131 Encounter for screening for diabetes mellitus: Secondary | ICD-10-CM

## 2023-02-04 DIAGNOSIS — M48062 Spinal stenosis, lumbar region with neurogenic claudication: Secondary | ICD-10-CM

## 2023-02-04 DIAGNOSIS — Z13228 Encounter for screening for other metabolic disorders: Secondary | ICD-10-CM

## 2023-02-04 DIAGNOSIS — G629 Polyneuropathy, unspecified: Secondary | ICD-10-CM | POA: Diagnosis not present

## 2023-02-04 NOTE — Progress Notes (Signed)
Subjective:  Patient ID: [name], female    DOB: 1970-11-23  Age: 52 y.o. MRN: [id number]  CC: Back Pain   HPI [name] is a 52 y.o. year old female with a history of asthma, hypertension, depression, chronic low back pain secondary to spinal stenosis, degenerative disc disease of the thoracic spine, cervical stenosis, hot flashes, and chronic constipation  Pain is managed by [facility].  Interval History:  Her lower back feels like a cramp and does not radiate rated as 8/10 She was denied disability and states she has been going through it for a while.  She remains on her pain medication prescribed by pain management. She complains of applying for disability in multiple locations and has been denied. For her anxiety and depression, she still goes to [employer] and remains on her psychotropic medications.  Her asthma is stable with no recent flares. Past Medical History:  Diagnosis Date   Allergy    pollen, pcn   Anxiety    Asthma    Cataract    COPD (chronic obstructive pulmonary disease) (HCC)    Depression    Fibroids    Glaucoma    History of ovarian cyst    Medical history non-contributory    sickle cell   Menometrorrhagia    Sickle cell anemia (HCC)    has the trait   Sickle cell trait (HCC)     Past Surgical History:  Procedure Laterality Date   CESAREAN SECTION     all three pregnancies   COLONOSCOPY  07/04/2021    Family History  Problem Relation Age of Onset   Cancer Mother        colon cancer   Colon polyps Mother    Hypertension Mother    Heart disease Father    Cancer Father        cancer   Other Neg Hx    Esophageal cancer Neg Hx    Rectal cancer Neg Hx    Stomach cancer Neg Hx     Social History   Socioeconomic History   Marital status: Divorced    Spouse name: Not on file   Number of children: Not on file   Years of education: Not on file   Highest education level: Not on file  Occupational  History   Not on file  Tobacco Use   Smoking status: Former    Packs/day: .25    Types: Cigarettes    Quit date: 02/14/2016    Years since quitting: 6.9   Smokeless tobacco: Not on file   Tobacco comments:    3 cigarettes daily 06-22-21  Vaping Use   Vaping Use: Never used  Substance and Sexual Activity   Alcohol use: No   Drug use: No   Sexual activity: Yes    Partners: Male    Birth control/protection: I.U.D., None  Other Topics Concern   Not on file  Social History Narrative   Not on file   Social Determinants of Health   Financial Resource Strain: Not on file  Food Insecurity: Not on file  Transportation Needs: Not on file  Physical Activity: Not on file  Stress: Not on file  Social Connections: Not on file    Allergies  Allergen Reactions   Bupropion Hcl Er (Sr) Hives and Other (See Comments)    The patient has an entire page of reactions that can be caused by the medication, but none that she has suffered.  Fruit & Vegetable Daily [Nutritional Supplements] Other (See Comments)    "all fruits causes bumps and weird feeling in her mouth"   Penicillins Hives   Pollen Extract-Tree Extract Other (See Comments)    Allergies   Other Rash    "bugs" roaches    Outpatient Medications Prior to Visit  Medication Sig Dispense Refill   albuterol (PROAIR HFA) 108 (90 Base) MCG/ACT inhaler Inhale 2 puffs into the lungs every 6 (six) hours as needed for wheezing or shortness of breath. 18 g 2   busPIRone (BUSPAR) 15 MG tablet Take 1 tablet by mouth 2 times per day 60 tablet 2   cariprazine (VRAYLAR) 1.5 MG capsule Take 2 capsules by mouth daily. 60 capsule 2   cloNIDine (CATAPRES) 0.2 MG tablet TAKE 1 TABLET (0.2 MG TOTAL) BY MOUTH AT BEDTIME. 90 tablet 1   clotrimazole (LOTRIMIN) 1 % cream Apply 1 application topically 2 (two) times daily. 45 g 1   cyclobenzaprine (FLEXERIL) 10 MG tablet Take 1 tablet (10 mg total) by mouth 2 (two) times daily as needed for muscle spasms 60  tablet 3   fluticasone-salmeterol (ADVAIR DISKUS) 500-50 MCG/ACT AEPB Inhale 1 puff into the lungs 2 (two) times daily. 180 each 1   furosemide (LASIX) 20 MG tablet Take 1 tablet (20 mg total) by mouth daily as needed. 30 tablet 1   ibuprofen (ADVIL) 800 MG tablet Take 1 tablet (800 mg total) by mouth 3 (three) times daily. 21 tablet 0   lactulose, encephalopathy, (CHRONULAC) 10 GM/15ML SOLN Take 15 mLs (10 g total) by mouth 2 (two) times daily as needed for mild constipation. 946 mL 1   levocetirizine (XYZAL) 5 MG tablet Take 1 tablet (5 mg total) by mouth every evening. 30 tablet 2   lidocaine (LIDODERM) 5 % Place 1 patch onto the skin daily. Remove & Discard patch within 12 hours or as directed by MD 30 patch 3   meloxicam (MOBIC) 7.5 MG tablet Take 2 tablets (15 mg total) by mouth daily as needed. 14 tablet 0   Misc. Devices MISC 4-pronged cane.  Diagnosis-spinal stenosis 1 each 0   Misc. Devices MISC Rollator walker with seat.  Diagnosis spinal stenosis 1 each 0   naloxone (NARCAN) nasal spray 4 mg/0.1 mL 1 (ONE) SPRAY BY NOSE AS NEEDED, IF FOUND UNRESPONSIVE THEN SPRAY INTO NOSE AND CALL 911     oxyCODONE-acetaminophen (PERCOCET) 10-325 MG tablet Take 1 tablet by mouth 3 (three) times daily as needed. Takes 4 times daily     oxyCODONE-acetaminophen (PERCOCET) 10-325 MG tablet Take 1 tablet by mouth 4 (four) times daily as needed. 120 tablet 0   prazosin (MINIPRESS) 1 MG capsule Take 1 capsule by mouth at bedtime for nightmares 30 capsule 2   predniSONE (DELTASONE) 20 MG tablet Take 1 tablet (20 mg total) by mouth daily with breakfast. 5 tablet 0   triamcinolone cream (KENALOG) 0.1 % Apply 1 application topically daily to arm for eczema 15 g 0   varenicline (CHANTIX) 0.5 MG tablet Take 0.5 mg by mouth 3 (three) times daily.     DULoxetine (CYMBALTA) 60 MG capsule TAKE 1 CAPSULE (60 MG TOTAL) BY MOUTH DAILY. 30 capsule 6   gabapentin (NEURONTIN) 300 MG capsule TAKE 2 CAPSULES (600 MG TOTAL) BY  MOUTH 2 (TWO) TIMES DAILY. 120 capsule 6   traZODone (DESYREL) 50 MG tablet TAKE 1 TABLET (50 MG TOTAL) BY MOUTH AT BEDTIME AS NEEDED. FOR SLEEP 30 tablet 6  No facility-administered medications prior to visit.     ROS Review of Systems  Constitutional:  Negative for activity change and appetite change.  HENT:  Negative for sinus pressure and sore throat.   Respiratory:  Negative for chest tightness, shortness of breath and wheezing.   Cardiovascular:  Negative for chest pain and palpitations.  Gastrointestinal:  Negative for abdominal distention, abdominal pain and constipation.  Genitourinary: Negative.   Musculoskeletal:        See HPI  Psychiatric/Behavioral:  Negative for behavioral problems and dysphoric mood.     Objective:  BP 120/80   Pulse 71   Temp 99.2 F (37.3 C) (Oral)   Ht 5\' 1"  (1.549 m)   Wt 208 lb 12.8 oz (94.7 kg)   LMP 03/28/2016   SpO2 99%   BMI 39.45 kg/m      02/04/2023    1:52 PM 08/06/2022    1:54 PM 04/25/2022    9:23 PM  BP/Weight  Systolic BP 120 118 130  Diastolic BP 80 83 88  Wt. (Lbs) 208.8 207 194  BMI 39.45 kg/m2 39.11 kg/m2 36.66 kg/m2      Physical Exam Constitutional:      Appearance: She is well-developed.  Cardiovascular:     Rate and Rhythm: Normal rate.     Heart sounds: Normal heart sounds. No murmur heard. Pulmonary:     Effort: Pulmonary effort is normal.     Breath sounds: Normal breath sounds. No wheezing or rales.  Chest:     Chest wall: No tenderness.  Abdominal:     General: Bowel sounds are normal. There is no distension.     Palpations: Abdomen is soft. There is no mass.     Tenderness: There is no abdominal tenderness.  Musculoskeletal:        General: Tenderness (mid point of lumbar spine) present.     Right lower leg: No edema.     Left lower leg: No edema.     Comments: Positive straight leg raise bilaterally Tenderness in lumbar spine on range of motion  Neurological:     Mental Status: She is  alert and oriented to person, place, and time.  Psychiatric:        Mood and Affect: Mood normal.        Latest Ref Rng & Units 04/25/2022    9:56 PM 01/31/2022    2:28 PM 02/09/2021   10:24 AM  CMP  Glucose 70 - 99 mg/dL 409  75  84   BUN 6 - 20 mg/dL 13  11  8    Creatinine 0.44 - 1.00 mg/dL 8.11  9.14  7.82   Sodium 135 - 145 mmol/L 144  142  140   Potassium 3.5 - 5.1 mmol/L 4.0  4.4  4.4   Chloride 98 - 111 mmol/L 109  104  101   CO2 22 - 32 mmol/L 28  24  24    Calcium 8.9 - 10.3 mg/dL 9.2  8.9  9.5   Total Protein 6.0 - 8.5 g/dL  6.7  7.0   Total Bilirubin 0.0 - 1.2 mg/dL  0.5  0.6   Alkaline Phos 44 - 121 IU/L  106  106   AST 0 - 40 IU/L  15  12   ALT 0 - 32 IU/L  12  9     Lipid Panel     Component Value Date/Time   CHOL 166 01/31/2022 1428   TRIG 72 01/31/2022 1428  HDL 50 01/31/2022 1428   CHOLHDL 4.3 02/09/2021 1024   CHOLHDL 3.0 05/15/2016 0949   VLDL 13 05/15/2016 0949   LDLCALC 102 (H) 01/31/2022 1428    CBC    Component Value Date/Time   WBC 7.6 04/25/2022 2156   RBC 4.30 04/25/2022 2156   HGB 11.3 (L) 04/25/2022 2156   HGB 11.5 01/31/2022 1428   HCT 34.5 (L) 04/25/2022 2156   HCT 35.5 01/31/2022 1428   PLT 334 04/25/2022 2156   PLT 344 01/31/2022 1428   MCV 80.2 04/25/2022 2156   MCV 78 (L) 01/31/2022 1428   MCH 26.3 04/25/2022 2156   MCHC 32.8 04/25/2022 2156   RDW 13.6 04/25/2022 2156   RDW 12.9 01/31/2022 1428   LYMPHSABS 2.0 01/31/2022 1428   MONOABS 0.4 06/26/2010 2057   EOSABS 0.1 01/31/2022 1428   BASOSABS 0.0 01/31/2022 1428    Lab Results  Component Value Date   HGBA1C 4.9 01/31/2022    Assessment & Plan:  1. Spinal stenosis, lumbar region, with neurogenic claudication Uncontrolled  Continue with medications from pain management Will refer for aquatic therapy Advised to apply heat or ice whichever is tolerated to painful areas. Counseled on evidence of improvement in pain control with regards to yoga, water aerobics,  massage, home physical therapy, exercise as tolerated. - Ambulatory referral to Physical Therapy  2. Anxiety and depression Stable Management as per behavioral health  3. Mild intermittent asthma without complication Controlled Continue Advair, albuterol MDI  4. Screening for diabetes mellitus - Hemoglobin A1c; Future  5. Screening for metabolic disorder - LP+Non-HDL Cholesterol; Future - CMP14+EGFR; Future - CBC with Differential/Platelet; Future   No orders of the defined types were placed in this encounter.   Follow-up: Return in about 6 months (around 08/07/2023) for Chronic medical conditions.       Hoy Register, MD, FAAFP. [facility] [place], [place] [phone]   02/04/2023, 5:54 PM

## 2023-02-04 NOTE — Patient Instructions (Signed)

## 2023-02-04 NOTE — Telephone Encounter (Signed)
Met with pt today during a warm hand off. Pt states that she has been denied SS disability multiple times and she is not sure why. She stated that she has been working with [employer] but they have not been helpful to her at all, pt. reports. I gave her a Legal Aid flyer and asked her to contact them for guidance. She stated that she did about 3 years ago and they were not helpful but she is willing to do it again. IF you have any extra resources please let me know.   thanks

## 2023-02-04 NOTE — Progress Notes (Signed)
Discuss disability

## 2023-02-05 DIAGNOSIS — M5010 Cervical disc disorder with radiculopathy, unspecified cervical region: Secondary | ICD-10-CM | POA: Diagnosis not present

## 2023-02-05 DIAGNOSIS — M48062 Spinal stenosis, lumbar region with neurogenic claudication: Secondary | ICD-10-CM | POA: Diagnosis not present

## 2023-02-05 DIAGNOSIS — G629 Polyneuropathy, unspecified: Secondary | ICD-10-CM | POA: Diagnosis not present

## 2023-02-06 DIAGNOSIS — G629 Polyneuropathy, unspecified: Secondary | ICD-10-CM | POA: Diagnosis not present

## 2023-02-06 DIAGNOSIS — M5010 Cervical disc disorder with radiculopathy, unspecified cervical region: Secondary | ICD-10-CM | POA: Diagnosis not present

## 2023-02-06 DIAGNOSIS — M48062 Spinal stenosis, lumbar region with neurogenic claudication: Secondary | ICD-10-CM | POA: Diagnosis not present

## 2023-02-06 NOTE — Telephone Encounter (Signed)
Reginia Naas, LCSWA and I called patient to obtain additional information about her disability denials. She explained that she has applied for disability and has been denied 5 times.  She said she has worked with [employer] [facility])  in the past but said she did not receive assistance from them. I explained that we can refer her directly to [facility] for assistance addressing the denials. I explained that there is no guarantee that they will be able to help, however, if they are not able to, they can usually offer recommendations for assistance.   I also explained to her that if she needs to re-apply, we can refer her to [facility] for assistance with that application process.  She agreed to submit the referral to [facility] and the referral was sent via Epic.

## 2023-02-07 DIAGNOSIS — G629 Polyneuropathy, unspecified: Secondary | ICD-10-CM | POA: Diagnosis not present

## 2023-02-07 DIAGNOSIS — M5010 Cervical disc disorder with radiculopathy, unspecified cervical region: Secondary | ICD-10-CM | POA: Diagnosis not present

## 2023-02-07 DIAGNOSIS — M48062 Spinal stenosis, lumbar region with neurogenic claudication: Secondary | ICD-10-CM | POA: Diagnosis not present

## 2023-02-08 DIAGNOSIS — M5010 Cervical disc disorder with radiculopathy, unspecified cervical region: Secondary | ICD-10-CM | POA: Diagnosis not present

## 2023-02-08 DIAGNOSIS — M48062 Spinal stenosis, lumbar region with neurogenic claudication: Secondary | ICD-10-CM | POA: Diagnosis not present

## 2023-02-08 DIAGNOSIS — G629 Polyneuropathy, unspecified: Secondary | ICD-10-CM | POA: Diagnosis not present

## 2023-02-09 DIAGNOSIS — M5010 Cervical disc disorder with radiculopathy, unspecified cervical region: Secondary | ICD-10-CM | POA: Diagnosis not present

## 2023-02-09 DIAGNOSIS — M48062 Spinal stenosis, lumbar region with neurogenic claudication: Secondary | ICD-10-CM | POA: Diagnosis not present

## 2023-02-09 DIAGNOSIS — G629 Polyneuropathy, unspecified: Secondary | ICD-10-CM | POA: Diagnosis not present

## 2023-02-10 DIAGNOSIS — M5010 Cervical disc disorder with radiculopathy, unspecified cervical region: Secondary | ICD-10-CM | POA: Diagnosis not present

## 2023-02-10 DIAGNOSIS — G629 Polyneuropathy, unspecified: Secondary | ICD-10-CM | POA: Diagnosis not present

## 2023-02-10 DIAGNOSIS — M48062 Spinal stenosis, lumbar region with neurogenic claudication: Secondary | ICD-10-CM | POA: Diagnosis not present

## 2023-02-11 DIAGNOSIS — G629 Polyneuropathy, unspecified: Secondary | ICD-10-CM | POA: Diagnosis not present

## 2023-02-11 DIAGNOSIS — M48062 Spinal stenosis, lumbar region with neurogenic claudication: Secondary | ICD-10-CM | POA: Diagnosis not present

## 2023-02-11 DIAGNOSIS — M5010 Cervical disc disorder with radiculopathy, unspecified cervical region: Secondary | ICD-10-CM | POA: Diagnosis not present

## 2023-02-12 DIAGNOSIS — M5010 Cervical disc disorder with radiculopathy, unspecified cervical region: Secondary | ICD-10-CM | POA: Diagnosis not present

## 2023-02-12 DIAGNOSIS — G629 Polyneuropathy, unspecified: Secondary | ICD-10-CM | POA: Diagnosis not present

## 2023-02-12 DIAGNOSIS — M48062 Spinal stenosis, lumbar region with neurogenic claudication: Secondary | ICD-10-CM | POA: Diagnosis not present

## 2023-02-13 DIAGNOSIS — M48062 Spinal stenosis, lumbar region with neurogenic claudication: Secondary | ICD-10-CM | POA: Diagnosis not present

## 2023-02-13 DIAGNOSIS — M5010 Cervical disc disorder with radiculopathy, unspecified cervical region: Secondary | ICD-10-CM | POA: Diagnosis not present

## 2023-02-13 DIAGNOSIS — G629 Polyneuropathy, unspecified: Secondary | ICD-10-CM | POA: Diagnosis not present

## 2023-02-14 DIAGNOSIS — M48062 Spinal stenosis, lumbar region with neurogenic claudication: Secondary | ICD-10-CM | POA: Diagnosis not present

## 2023-02-14 DIAGNOSIS — F315 Bipolar disorder, current episode depressed, severe, with psychotic features: Secondary | ICD-10-CM | POA: Diagnosis not present

## 2023-02-14 DIAGNOSIS — M5010 Cervical disc disorder with radiculopathy, unspecified cervical region: Secondary | ICD-10-CM | POA: Diagnosis not present

## 2023-02-14 DIAGNOSIS — G629 Polyneuropathy, unspecified: Secondary | ICD-10-CM | POA: Diagnosis not present

## 2023-02-15 DIAGNOSIS — G629 Polyneuropathy, unspecified: Secondary | ICD-10-CM | POA: Diagnosis not present

## 2023-02-15 DIAGNOSIS — M5010 Cervical disc disorder with radiculopathy, unspecified cervical region: Secondary | ICD-10-CM | POA: Diagnosis not present

## 2023-02-15 DIAGNOSIS — M48062 Spinal stenosis, lumbar region with neurogenic claudication: Secondary | ICD-10-CM | POA: Diagnosis not present

## 2023-05-16 ENCOUNTER — Telehealth: Payer: Self-pay

## 2023-05-16 NOTE — Telephone Encounter (Signed)
FYI

## 2023-05-16 NOTE — Telephone Encounter (Signed)
Copied from CRM [id number]. Topic: General - Other >> May 16, 2023  2:35 PM Ja-Kwan M wrote: Reason for CRM: Pt reports that she had some bleeding so a ct scan was done. Pt stated [facility] got her scheduled for an appt on 07/26/23 with GI at [address] in [place] phone# [phone] Pt stated if Dr. Alvis Lemmings would like the ct results please contact that office to request the results.

## 2023-06-26 ENCOUNTER — Ambulatory Visit: Payer: MEDICAID | Attending: Family Medicine | Admitting: Family Medicine

## 2023-06-26 ENCOUNTER — Telehealth: Payer: MEDICAID | Admitting: Family Medicine

## 2023-06-26 ENCOUNTER — Other Ambulatory Visit: Payer: Self-pay

## 2023-06-26 ENCOUNTER — Other Ambulatory Visit: Payer: Self-pay | Admitting: Family Medicine

## 2023-06-26 VITALS — BP 138/89 | HR 67 | Ht 61.0 in | Wt 210.8 lb

## 2023-06-26 DIAGNOSIS — M48062 Spinal stenosis, lumbar region with neurogenic claudication: Secondary | ICD-10-CM | POA: Diagnosis not present

## 2023-06-26 DIAGNOSIS — M5010 Cervical disc disorder with radiculopathy, unspecified cervical region: Secondary | ICD-10-CM

## 2023-06-26 DIAGNOSIS — Z029 Encounter for administrative examinations, unspecified: Secondary | ICD-10-CM | POA: Diagnosis not present

## 2023-06-26 MED ORDER — LIDOCAINE 5 % EX PTCH
1.0000 | MEDICATED_PATCH | CUTANEOUS | 3 refills | Status: DC
  Filled 2023-06-26 – 2023-06-27 (×2): qty 30, 30d supply, fill #0

## 2023-06-26 NOTE — Progress Notes (Unsigned)
Subjective:  Patient ID: [name], female    DOB: [birth year]  Age: 52 y.o. MRN: [id number]  CC: Medical Management of Chronic Issues (Needs form completed/Cyst on ovary and cervix)   HPI [name] is a 52 y.o. year old female with a history of asthma, hypertension, depression, chronic low back pain secondary to spinal stenosis, degenerative disc disease of the thoracic spine, cervical stenosis, hot flashes, and chronic constipation  Pain is managed by [facility].  The patient, with a history of spinal stenosis and chronic back pain, is applying for social security disability. She reports difficulty sitting, standing, and walking for extended periods due to back pain. She can only sit or stand for about thirty minutes at a time and walk for the same duration. She requires frequent position changes for comfort and the ability to lie down during an eight-hour workday. She uses a cane and a walker for mobility. She also reports symptoms of anxiety and depression, including poor memory, social withdrawal, difficulty thinking or concentrating, recurrent panic attacks, intrusive recollections of traumatic experiences, paranoia, sleep disturbances, and lack of interest in activities. She has a nurse that assists with activities of daily living. She reports extreme difficulty dealing with work stress, difficulty following instructions and staying on task, and difficulty maintaining social functioning.      She currently ambulates with a cane, is unable to work. She receives opioid analgesics from pain management.   Past Medical History:  Diagnosis Date   Allergy    pollen, pcn   Anxiety    Asthma    Cataract    COPD (chronic obstructive pulmonary disease) (HCC)    Depression    Fibroids    Glaucoma    History of ovarian cyst    Medical history non-contributory    sickle cell   Menometrorrhagia    Sickle cell anemia (HCC)    has the trait   Sickle cell trait (HCC)      Past Surgical History:  Procedure Laterality Date   CESAREAN SECTION     all three pregnancies   COLONOSCOPY  07/04/2021    Family History  Problem Relation Age of Onset   Cancer Mother        colon cancer   Colon polyps Mother    Hypertension Mother    Heart disease Father    Cancer Father        cancer   Other Neg Hx    Esophageal cancer Neg Hx    Rectal cancer Neg Hx    Stomach cancer Neg Hx     Social History   Socioeconomic History   Marital status: Divorced    Spouse name: Not on file   Number of children: Not on file   Years of education: Not on file   Highest education level: Not on file  Occupational History   Not on file  Tobacco Use   Smoking status: Former    Current packs/day: 0.00    Types: Cigarettes    Quit date: 02/14/2016    Years since quitting: 7.3   Smokeless tobacco: Not on file   Tobacco comments:    3 cigarettes daily 06-22-21  Vaping Use   Vaping status: Never Used  Substance and Sexual Activity   Alcohol use: No   Drug use: No   Sexual activity: Yes    Partners: Male    Birth control/protection: I.U.D., None  Other Topics Concern   Not on file  Social History Narrative   Not on file   Social Determinants of Health   Financial Resource Strain: High Risk (06/26/2023)   Overall Financial Resource Strain (CARDIA)    Difficulty of Paying Living Expenses: Very hard  Food Insecurity: No Food Insecurity (06/26/2023)   Hunger Vital Sign    Worried About Running Out of Food in the Last Year: Never true    Ran Out of Food in the Last Year: Never true  Transportation Needs: No Transportation Needs (06/26/2023)   PRAPARE - Administrator, Civil Service (Medical): No    Lack of Transportation (Non-Medical): No  Physical Activity: Insufficiently Active (06/26/2023)   Exercise Vital Sign    Days of Exercise per Week: 7 days    Minutes of Exercise per Session: 10 min  Stress: No Stress Concern Present (06/26/2023)   [employer] of Occupational Health - Occupational Stress Questionnaire    Feeling of Stress : Only a little  Social Connections: Moderately Isolated (06/26/2023)   Social Connection and Isolation Panel [NHANES]    Frequency of Communication with Friends and Family: Once a week    Frequency of Social Gatherings with Friends and Family: Once a week    Attends Religious Services: 1 to 4 times per year    Active Member of [employer] or Organizations: Yes    Attends Banker Meetings: 1 to 4 times per year    Marital Status: Divorced    Allergies  Allergen Reactions   Bupropion Hcl Er (Sr) Hives and Other (See Comments)    The patient has an entire page of reactions that can be caused by the medication, but none that she has suffered.   Fruit & Vegetable Daily [Nutritional Supplements] Other (See Comments)    "all fruits causes bumps and weird feeling in her mouth"   Penicillins Hives   Pollen Extract-Tree Extract Other (See Comments)    Allergies   Other Rash    "bugs" roaches    Outpatient Medications Prior to Visit  Medication Sig Dispense Refill   albuterol (PROAIR HFA) 108 (90 Base) MCG/ACT inhaler Inhale 2 puffs into the lungs every 6 (six) hours as needed for wheezing or shortness of breath. 18 g 2   busPIRone (BUSPAR) 15 MG tablet Take 1 tablet by mouth 2 times per day 60 tablet 2   cariprazine (VRAYLAR) 1.5 MG capsule Take 2 capsules by mouth daily. 60 capsule 2   cloNIDine (CATAPRES) 0.2 MG tablet TAKE 1 TABLET (0.2 MG TOTAL) BY MOUTH AT BEDTIME. 90 tablet 1   clotrimazole (LOTRIMIN) 1 % cream Apply 1 application topically 2 (two) times daily. 45 g 1   cyclobenzaprine (FLEXERIL) 10 MG tablet Take 1 tablet (10 mg total) by mouth 2 (two) times daily as needed for muscle spasms 60 tablet 3   fluticasone-salmeterol (ADVAIR DISKUS) 500-50 MCG/ACT AEPB Inhale 1 puff into the lungs 2 (two) times daily. 180 each 1   furosemide (LASIX) 20 MG tablet Take 1 tablet (20 mg total) by  mouth daily as needed. 30 tablet 1   ibuprofen (ADVIL) 800 MG tablet Take 1 tablet (800 mg total) by mouth 3 (three) times daily. 21 tablet 0   lactulose, encephalopathy, (CHRONULAC) 10 GM/15ML SOLN Take 15 mLs (10 g total) by mouth 2 (two) times daily as needed for mild constipation. 946 mL 1   levocetirizine (XYZAL) 5 MG tablet Take 1 tablet (5 mg total) by mouth every evening. 30 tablet 2  meloxicam (MOBIC) 7.5 MG tablet Take 2 tablets (15 mg total) by mouth daily as needed. 14 tablet 0   Misc. Devices MISC 4-pronged cane.  Diagnosis-spinal stenosis 1 each 0   Misc. Devices MISC Rollator walker with seat.  Diagnosis spinal stenosis 1 each 0   naloxone (NARCAN) nasal spray 4 mg/0.1 mL 1 (ONE) SPRAY BY NOSE AS NEEDED, IF FOUND UNRESPONSIVE THEN SPRAY INTO NOSE AND CALL 911     oxyCODONE-acetaminophen (PERCOCET) 10-325 MG tablet Take 1 tablet by mouth 3 (three) times daily as needed. Takes 4 times daily     oxyCODONE-acetaminophen (PERCOCET) 10-325 MG tablet Take 1 tablet by mouth 4 (four) times daily as needed. 120 tablet 0   prazosin (MINIPRESS) 1 MG capsule Take 1 capsule by mouth at bedtime for nightmares 30 capsule 2   predniSONE (DELTASONE) 20 MG tablet Take 1 tablet (20 mg total) by mouth daily with breakfast. 5 tablet 0   triamcinolone cream (KENALOG) 0.1 % Apply 1 application topically daily to arm for eczema 15 g 0   varenicline (CHANTIX) 0.5 MG tablet Take 0.5 mg by mouth 3 (three) times daily.     lidocaine (LIDODERM) 5 % Place 1 patch onto the skin daily. Remove & Discard patch within 12 hours or as directed by MD 30 patch 3   DULoxetine (CYMBALTA) 60 MG capsule TAKE 1 CAPSULE (60 MG TOTAL) BY MOUTH DAILY. 30 capsule 6   gabapentin (NEURONTIN) 300 MG capsule TAKE 2 CAPSULES (600 MG TOTAL) BY MOUTH 2 (TWO) TIMES DAILY. 120 capsule 6   traZODone (DESYREL) 50 MG tablet TAKE 1 TABLET (50 MG TOTAL) BY MOUTH AT BEDTIME AS NEEDED. FOR SLEEP 30 tablet 6   No facility-administered medications  prior to visit.     ROS Review of Systems  Constitutional:  Negative for activity change and appetite change.  HENT:  Negative for sinus pressure and sore throat.   Respiratory:  Negative for chest tightness, shortness of breath and wheezing.   Cardiovascular:  Negative for chest pain and palpitations.  Gastrointestinal:  Negative for abdominal distention, abdominal pain and constipation.  Genitourinary: Negative.   Musculoskeletal:  Positive for back pain.  Psychiatric/Behavioral:  Negative for behavioral problems and dysphoric mood.     Objective:  BP 138/89   Pulse 67   Ht 5\' 1"  (1.549 m)   Wt 210 lb 12.8 oz (95.6 kg)   LMP 03/28/2016   SpO2 95%   BMI 39.83 kg/m      06/26/2023    4:40 PM 02/04/2023    1:52 PM 08/06/2022    1:54 PM  BP/Weight  Systolic BP 138 120 118  Diastolic BP 89 80 83  Wt. (Lbs) 210.8 208.8 207  BMI 39.83 kg/m2 39.45 kg/m2 39.11 kg/m2      Physical Exam Constitutional:      Appearance: She is well-developed.  Cardiovascular:     Rate and Rhythm: Normal rate.     Heart sounds: Normal heart sounds. No murmur heard. Pulmonary:     Effort: Pulmonary effort is normal.     Breath sounds: Normal breath sounds. No wheezing or rales.  Chest:     Chest wall: No tenderness.  Abdominal:     General: Bowel sounds are normal. There is no distension.     Palpations: Abdomen is soft. There is no mass.     Tenderness: There is no abdominal tenderness.  Musculoskeletal:     Right lower leg: No edema.  Left lower leg: No edema.     Comments: Tenderness on palpation of lumbar spine  Neurological:     Mental Status: She is alert and oriented to person, place, and time.  Psychiatric:        Mood and Affect: Mood normal.        Latest Ref Rng & Units 04/25/2022    9:56 PM 01/31/2022    2:28 PM 02/09/2021   10:24 AM  CMP  Glucose 70 - 99 mg/dL 161  75  84   BUN 6 - 20 mg/dL 13  11  8    Creatinine 0.44 - 1.00 mg/dL 0.96  0.45  4.09   Sodium 135  - 145 mmol/L 144  142  140   Potassium 3.5 - 5.1 mmol/L 4.0  4.4  4.4   Chloride 98 - 111 mmol/L 109  104  101   CO2 22 - 32 mmol/L 28  24  24    Calcium 8.9 - 10.3 mg/dL 9.2  8.9  9.5   Total Protein 6.0 - 8.5 g/dL  6.7  7.0   Total Bilirubin 0.0 - 1.2 mg/dL  0.5  0.6   Alkaline Phos 44 - 121 IU/L  106  106   AST 0 - 40 IU/L  15  12   ALT 0 - 32 IU/L  12  9     Lipid Panel     Component Value Date/Time   CHOL 166 01/31/2022 1428   TRIG 72 01/31/2022 1428   HDL 50 01/31/2022 1428   CHOLHDL 4.3 02/09/2021 1024   CHOLHDL 3.0 05/15/2016 0949   VLDL 13 05/15/2016 0949   LDLCALC 102 (H) 01/31/2022 1428    CBC    Component Value Date/Time   WBC 7.6 04/25/2022 2156   RBC 4.30 04/25/2022 2156   HGB 11.3 (L) 04/25/2022 2156   HGB 11.5 01/31/2022 1428   HCT 34.5 (L) 04/25/2022 2156   HCT 35.5 01/31/2022 1428   PLT 334 04/25/2022 2156   PLT 344 01/31/2022 1428   MCV 80.2 04/25/2022 2156   MCV 78 (L) 01/31/2022 1428   MCH 26.3 04/25/2022 2156   MCHC 32.8 04/25/2022 2156   RDW 13.6 04/25/2022 2156   RDW 12.9 01/31/2022 1428   LYMPHSABS 2.0 01/31/2022 1428   MONOABS 0.4 06/26/2010 2057   EOSABS 0.1 01/31/2022 1428   BASOSABS 0.0 01/31/2022 1428    Lab Results  Component Value Date   HGBA1C 4.9 01/31/2022    Assessment & Plan:      Spinal Stenosis and Chronic Back Pain Severe pain limiting daily activities and ability to work. Requires assistance with daily activities. -Continue current management plan; receives opioid analgesics for pain management. -Complete and submit disability form indicating severe functional limitations due to back pain.  Anxiety and Depression Reports social withdrawal, difficulty concentrating, recurrent panic attacks, and intrusive recollections of traumatic experiences. -Continue current management plan. -Address further in follow-up visit in three weeks.    General Health Maintenance / Followup Plans -Schedule follow-up visit in three  weeks to discuss chronic medical conditions -Ensure patient has a copy of completed disability form.  -Have faxed over a copy to requested entity.         No orders of the defined types were placed in this encounter.   Follow-up: Return in about 3 weeks (around 07/17/2023) for Chronic medical conditions.       Hoy Register, MD, FAAFP. [facility] [place], [place] [phone]  06/27/2023, 3:19 PM

## 2023-06-26 NOTE — Patient Instructions (Signed)
Spinal Stenosis  Spinal stenosis is a condition that happens when the spinal canal narrows. The spinal canal is the space between the bones of your spine (vertebrae). This narrowing puts pressure on the spinal cord and nerves that exit the spine and run down the arms or legs. When nerves exiting the spine are pinched, it can cause pain, numbness, or weakness in the arms or legs. Spinal stenosis can affect the vertebrae in the neck, upper back, and lower back. Spinal stenosis can range from mild to severe. What are the causes? This condition is caused by areas of bone pushing into the spinal canal. This condition may be present at birth (congenital), or it may be caused by: Slow breakdown of your vertebrae (spinal degeneration). This usually starts between 77 and 76 years of age. Injury (trauma) to your spine. Previous spinal surgery. Tumors in your spine. Calcium deposits in your spine. What increases the risk? The following factors may make you more likely to develop this condition: Being older than age 13. Being born with an abnormally shaped spine (congenitalspinal deformity), such as scoliosis. Having arthritis. What are the signs or symptoms? Symptoms of this condition include: Pain in the neck or back that is generally worse with activities, particularly when standing or walking. Numbness, tingling, hot or cold sensations, weakness, or tiredness (fatigue) in your arms or legs. This can happen in one arm or leg, or both. Pain going from the buttock, down the thigh, and to the calf (sciatica). This can happen in one or both legs. Falling frequently. Foot drop. This is when you have trouble lifting the front part of your foot and it drags on the ground when you walk. This can lead to muscle weakness. In more severe cases, you may develop: Problems having a bowel movement or urinating. Difficulty having sex. Loss of feeling in your legs and inability to walk. Symptoms may come on slowly  and get worse over time. In some cases, there are no symptoms. How is this diagnosed? This condition is diagnosed based on your medical history and a physical exam. You may also have tests, such as an X-ray, CT scan, or MRI. How is this treated? Treatment for this condition often focuses on managing your pain and any other symptoms. Treatment may include: Practicing good posture to lessen pressure on your nerves. Exercises to strengthen muscles, build endurance, improve balance, and maintain range of motion. This may include physical therapy to restore movement and strength to your back. Losing weight, if needed. Medicines to reduce inflammation or pain. This may include a medicine that is injected into your spine (steroidinjection). Assistive devices, such as a corset or brace. In some cases, surgery may be needed. The most common procedure is decompression laminectomy. This removes excess bone that puts pressure on your nerve roots. Follow these instructions at home: Managing pain, stiffness, and swelling  Practice good posture. If you were given a brace or a corset, wear it as told by your health care provider. Maintain a healthy weight. Talk with your health care provider if you need help losing weight. If directed, apply heat to the affected area as often as told by your health care provider. Use the heat source that your health care provider recommends, such as a moist heat pack or a heating pad. Place a towel between your skin and the heat source. Leave the heat on for 20-30 minutes. If your skin turns bright red, remove the heat right away to prevent burns. The risk  of burns is higher if you cannot feel pain, heat, or cold. Activity Do all exercises and stretches as told by your health care provider. Do not do any activities that cause pain. You may have to avoid lifting. Ask your health care provider how much you can safely lift. Return to your normal activities as told by your  health care provider. Ask your health care provider what activities are safe for you. General instructions Take over-the-counter and prescription medicines only as told by your health care provider. Do not use any products that contain nicotine or tobacco. These products include cigarettes, chewing tobacco, and vaping devices, such as e-cigarettes. If you need help quitting, ask your health care provider. Eat a healthy diet. This includes plenty of fruits and vegetables, whole grains, and low-fat (lean) protein. Where to find more information [employer] of Arthritis and Musculoskeletal and Skin Diseases: www.niams.http://www.myers.net/ Contact a health care provider if: Your symptoms do not get better or they get worse. You have a fever. Get help right away if: You have new pain or symptoms of severe pain, such as: New or worsening pain in your neck or upper back. Severe pain that cannot be controlled with medicines. A severe headache that gets worse when you stand. You are dizzy. You have vision problems, such as blurred vision or double vision. You have nausea or vomiting. You develop new or worsening numbness or tingling in your back or legs. You lose control of your bowels or bladder. You have pain, redness, swelling, or warmth in your arm or leg. These symptoms may be an emergency. Get help right away. Call 911. Do not wait to see if the symptoms will go away. Do not drive yourself to the hospital. Summary Spinal stenosis is a condition that happens when the spinal canal narrows, putting pressure on the spinal cord or nerves that exit the vertebrae. This condition is caused by areas of bone pushing into the spinal canal. Spinal stenosis can cause numbness, weakness, or pain in the buttocks, neck, back, arms, and legs. This condition is usually diagnosed with your medical history, a physical exam, and tests, such as an X-ray, CT scan, or MRI. This information is not intended to replace  advice given to you by your health care provider. Make sure you discuss any questions you have with your health care provider. Document Revised: 11/28/2021 Document Reviewed: 11/28/2021 Elsevier Patient Education  2024 [employer].

## 2023-06-27 ENCOUNTER — Other Ambulatory Visit (HOSPITAL_COMMUNITY): Payer: Self-pay

## 2023-06-27 ENCOUNTER — Other Ambulatory Visit: Payer: Self-pay

## 2023-06-28 ENCOUNTER — Telehealth: Payer: Self-pay

## 2023-06-28 NOTE — Telephone Encounter (Signed)
Copied from CRM [id number]. Topic: General - Other >> Jun 24, 2023  5:20 PM Ja-Kwan M wrote: Reason for CRM: Pt stated that she received a call asking that she return the call to complete some paperwork over the phone. Pt requests call back. Cb# [phone]

## 2023-07-01 NOTE — Telephone Encounter (Signed)
CRM old, pt has had paperwork complete.

## 2023-07-03 ENCOUNTER — Other Ambulatory Visit (HOSPITAL_COMMUNITY): Payer: Self-pay

## 2023-07-03 ENCOUNTER — Other Ambulatory Visit: Payer: Self-pay

## 2023-07-03 ENCOUNTER — Encounter (HOSPITAL_COMMUNITY): Payer: Self-pay | Admitting: Pharmacist

## 2023-07-24 ENCOUNTER — Ambulatory Visit: Payer: MEDICAID | Attending: Family Medicine | Admitting: Family Medicine

## 2023-07-24 ENCOUNTER — Other Ambulatory Visit: Payer: Self-pay

## 2023-07-24 VITALS — BP 121/86 | HR 70 | Ht 61.0 in | Wt 208.8 lb

## 2023-07-24 DIAGNOSIS — N951 Menopausal and female climacteric states: Secondary | ICD-10-CM

## 2023-07-24 DIAGNOSIS — Z131 Encounter for screening for diabetes mellitus: Secondary | ICD-10-CM

## 2023-07-24 DIAGNOSIS — M48062 Spinal stenosis, lumbar region with neurogenic claudication: Secondary | ICD-10-CM | POA: Diagnosis not present

## 2023-07-24 DIAGNOSIS — J4520 Mild intermittent asthma, uncomplicated: Secondary | ICD-10-CM | POA: Diagnosis not present

## 2023-07-24 DIAGNOSIS — Z23 Encounter for immunization: Secondary | ICD-10-CM | POA: Diagnosis not present

## 2023-07-24 DIAGNOSIS — M5010 Cervical disc disorder with radiculopathy, unspecified cervical region: Secondary | ICD-10-CM

## 2023-07-24 DIAGNOSIS — K5909 Other constipation: Secondary | ICD-10-CM

## 2023-07-24 DIAGNOSIS — N83201 Unspecified ovarian cyst, right side: Secondary | ICD-10-CM

## 2023-07-24 MED ORDER — TRIAMCINOLONE ACETONIDE 0.1 % EX CREA
1.0000 | TOPICAL_CREAM | Freq: Every day | CUTANEOUS | 0 refills | Status: AC
  Filled 2023-07-24: qty 15, 15d supply, fill #0

## 2023-07-24 MED ORDER — MELOXICAM 7.5 MG PO TABS
7.5000 mg | ORAL_TABLET | Freq: Every day | ORAL | 1 refills | Status: AC | PRN
  Filled 2023-07-24: qty 90, 90d supply, fill #0

## 2023-07-24 MED ORDER — GABAPENTIN 300 MG PO CAPS
600.0000 mg | ORAL_CAPSULE | Freq: Two times a day (BID) | ORAL | 6 refills | Status: DC
  Filled 2023-07-24: qty 120, fill #0

## 2023-07-24 MED ORDER — CLONIDINE HCL 0.2 MG PO TABS
0.2000 mg | ORAL_TABLET | Freq: Every day | ORAL | 1 refills | Status: DC
  Filled 2023-07-24: qty 90, fill #0

## 2023-07-24 MED ORDER — LACTULOSE ENCEPHALOPATHY 10 GM/15ML PO SOLN
10.0000 g | Freq: Two times a day (BID) | ORAL | 1 refills | Status: AC | PRN
  Filled 2023-07-24: qty 946, 32d supply, fill #0

## 2023-07-24 MED ORDER — LIDOCAINE 5 % EX PTCH
1.0000 | MEDICATED_PATCH | CUTANEOUS | 3 refills | Status: AC
  Filled 2023-07-24: qty 30, 30d supply, fill #0

## 2023-07-24 MED ORDER — NYSTATIN 100000 UNIT/ML MT SUSP
5.0000 mL | Freq: Four times a day (QID) | OROMUCOSAL | 0 refills | Status: DC
  Filled 2023-07-24: qty 120, 6d supply, fill #0

## 2023-07-24 MED ORDER — CYCLOBENZAPRINE HCL 10 MG PO TABS
10.0000 mg | ORAL_TABLET | Freq: Two times a day (BID) | ORAL | 1 refills | Status: DC
  Filled 2023-07-24: qty 90, 45d supply, fill #0

## 2023-07-24 MED ORDER — FLUTICASONE-SALMETEROL 500-50 MCG/ACT IN AEPB
1.0000 | INHALATION_SPRAY | Freq: Two times a day (BID) | RESPIRATORY_TRACT | 1 refills | Status: DC
  Filled 2023-07-24: qty 180, 90d supply, fill #0

## 2023-07-24 MED ORDER — ALBUTEROL SULFATE HFA 108 (90 BASE) MCG/ACT IN AERS
2.0000 | INHALATION_SPRAY | Freq: Four times a day (QID) | RESPIRATORY_TRACT | 2 refills | Status: DC | PRN
  Filled 2023-07-24 – 2023-09-26 (×3): qty 18, 25d supply, fill #0

## 2023-07-24 NOTE — Patient Instructions (Signed)
VISIT SUMMARY:  During today's visit, we discussed several health concerns including a cyst on your right ovary, recent rectal bleeding, ongoing knee and back pain, and your current management for depression, bipolar disorder, and asthma. We also reviewed your general health maintenance needs.  YOUR PLAN:  -RIGHT OVARIAN CYST: A cyst on the ovary is a fluid-filled sac that can cause pain or discomfort. We will request your medical records from the outside hospital to get more information about the cyst and refer you to a gynecologist once we have reviewed the records.  -RECTAL BLEEDING: Rectal bleeding can be caused by various conditions, and it is important to investigate the cause. You have a colonoscopy scheduled for this Friday, which will help [place] understand the reason for the bleeding.  -DEPRESSION AND BIPOLAR DISORDER: Depression and bipolar disorder are mental health conditions that affect your mood and behavior. You are currently seeing a psychiatrist and taking your medications as prescribed. Please continue with your current psychiatric management.  -ASTHMA: Asthma is a condition that affects your airways and can cause difficulty breathing. You have not had any recent flare-ups and are using Albuterol and Advair. We will refill these prescriptions for you.  -GENERAL HEALTH MAINTENANCE: We will order routine labs to check your kidney and liver function and screen for diabetes. Please continue with your exercise regimen as it is beneficial for your overall health.  INSTRUCTIONS:  Please proceed with your scheduled colonoscopy this Friday. We will follow up with you once we receive and review the medical records regarding your ovarian cyst. Continue taking your medications for depression, bipolar disorder, and asthma as prescribed. Ensure you get the routine labs done as ordered.

## 2023-07-24 NOTE — Progress Notes (Signed)
Subjective:  Patient ID: [name], female    DOB: 09/25/1970  Age: 52 y.o. MRN: [id number]  CC: Medical Management of Chronic Issues (Referral to GYN/)   HPI [name] is a 52 y.o. year old female with a history of asthma, hypertension, depression, chronic low back pain secondary to spinal stenosis, degenerative disc disease of the thoracic spine, cervical stenosis, hot flashes, and chronic constipation  Pain is managed by [facility].  Interval History: Discussed the use of AI scribe software for clinical note transcription with the patient, who gave verbal consent to proceed.  She presents with a chief complaint of a cyst on her right ovary, discovered during an x-ray examination at an outside hospital.  She has no vaginal bleeding but does have intermittent right lower quadrant pain.  The patient initially sought medical attention due to rectal bleeding, which has since resolved. The bleeding led to a scheduled colonoscopy.  The patient also reports ongoing knee and back pain which is managed by [facility] pain management. She is currently on medication for her mental health conditions and asthma, and requests refills for these medications.        Past Medical History:  Diagnosis Date   Allergy    pollen, pcn   Anxiety    Asthma    Cataract    COPD (chronic obstructive pulmonary disease) (HCC)    Depression    Fibroids    Glaucoma    History of ovarian cyst    Medical history non-contributory    sickle cell   Menometrorrhagia    Sickle cell anemia (HCC)    has the trait   Sickle cell trait (HCC)     Past Surgical History:  Procedure Laterality Date   CESAREAN SECTION     all three pregnancies   COLONOSCOPY  07/04/2021    Family History  Problem Relation Age of Onset   Cancer Mother        colon cancer   Colon polyps Mother    Hypertension Mother    Heart disease Father    Cancer Father        cancer   Other Neg Hx    Esophageal cancer  Neg Hx    Rectal cancer Neg Hx    Stomach cancer Neg Hx     Social History   Socioeconomic History   Marital status: Divorced    Spouse name: Not on file   Number of children: Not on file   Years of education: Not on file   Highest education level: Not on file  Occupational History   Not on file  Tobacco Use   Smoking status: Former    Current packs/day: 0.00    Types: Cigarettes    Quit date: 02/14/2016    Years since quitting: 7.4   Smokeless tobacco: Not on file   Tobacco comments:    3 cigarettes daily 06-22-21  Vaping Use   Vaping status: Never Used  Substance and Sexual Activity   Alcohol use: No   Drug use: No   Sexual activity: Yes    Partners: Male    Birth control/protection: I.U.D., None  Other Topics Concern   Not on file  Social History Narrative   Not on file   Social Determinants of Health   Financial Resource Strain: High Risk (06/26/2023)   Overall Financial Resource Strain (CARDIA)    Difficulty of Paying Living Expenses: Very hard  Food Insecurity: No Food Insecurity (06/26/2023)  Hunger Vital Sign    Worried About Running Out of Food in the Last Year: Never true    Ran Out of Food in the Last Year: Never true  Transportation Needs: No Transportation Needs (06/26/2023)   PRAPARE - Administrator, Civil Service (Medical): No    Lack of Transportation (Non-Medical): No  Physical Activity: Insufficiently Active (06/26/2023)   Exercise Vital Sign    Days of Exercise per Week: 7 days    Minutes of Exercise per Session: 10 min  Stress: No Stress Concern Present (06/26/2023)   [employer] of Occupational Health - Occupational Stress Questionnaire    Feeling of Stress : Only a little  Social Connections: Moderately Isolated (06/26/2023)   Social Connection and Isolation Panel [NHANES]    Frequency of Communication with Friends and Family: Once a week    Frequency of Social Gatherings with Friends and Family: Once a week    Attends  Religious Services: 1 to 4 times per year    Active Member of [employer] or Organizations: Yes    Attends Banker Meetings: 1 to 4 times per year    Marital Status: Divorced    Allergies  Allergen Reactions   Bupropion Hcl Er (Sr) Hives and Other (See Comments)    The patient has an entire page of reactions that can be caused by the medication, but none that she has suffered.   Fruit & Vegetable Daily [Nutritional Supplements] Other (See Comments)    "all fruits causes bumps and weird feeling in her mouth"   Penicillins Hives   Pollen Extract-Tree Extract Other (See Comments)    Allergies   Other Rash    "bugs" roaches    Outpatient Medications Prior to Visit  Medication Sig Dispense Refill   busPIRone (BUSPAR) 15 MG tablet Take 1 tablet by mouth 2 times per day 60 tablet 2   cariprazine (VRAYLAR) 1.5 MG capsule Take 2 capsules by mouth daily. 60 capsule 2   clotrimazole (LOTRIMIN) 1 % cream Apply 1 application topically 2 (two) times daily. 45 g 1   furosemide (LASIX) 20 MG tablet Take 1 tablet (20 mg total) by mouth daily as needed. 30 tablet 1   ibuprofen (ADVIL) 800 MG tablet Take 1 tablet (800 mg total) by mouth 3 (three) times daily. 21 tablet 0   levocetirizine (XYZAL) 5 MG tablet Take 1 tablet (5 mg total) by mouth every evening. 30 tablet 2   Misc. Devices MISC 4-pronged cane.  Diagnosis-spinal stenosis 1 each 0   Misc. Devices MISC Rollator walker with seat.  Diagnosis spinal stenosis 1 each 0   naloxone (NARCAN) nasal spray 4 mg/0.1 mL 1 (ONE) SPRAY BY NOSE AS NEEDED, IF FOUND UNRESPONSIVE THEN SPRAY INTO NOSE AND CALL 911     oxyCODONE-acetaminophen (PERCOCET) 10-325 MG tablet Take 1 tablet by mouth 3 (three) times daily as needed. Takes 4 times daily     oxyCODONE-acetaminophen (PERCOCET) 10-325 MG tablet Take 1 tablet by mouth 4 (four) times daily as needed. 120 tablet 0   prazosin (MINIPRESS) 1 MG capsule Take 1 capsule by mouth at bedtime for nightmares 30  capsule 2   predniSONE (DELTASONE) 20 MG tablet Take 1 tablet (20 mg total) by mouth daily with breakfast. 5 tablet 0   varenicline (CHANTIX) 0.5 MG tablet Take 0.5 mg by mouth 3 (three) times daily.     albuterol (PROAIR HFA) 108 (90 Base) MCG/ACT inhaler Inhale 2 puffs into the lungs every  6 (six) hours as needed for wheezing or shortness of breath. 18 g 2   cloNIDine (CATAPRES) 0.2 MG tablet TAKE 1 TABLET (0.2 MG TOTAL) BY MOUTH AT BEDTIME. 90 tablet 1   cyclobenzaprine (FLEXERIL) 10 MG tablet Take 1 tablet (10 mg total) by mouth 2 (two) times daily as needed for muscle spasms 60 tablet 3   fluticasone-salmeterol (ADVAIR DISKUS) 500-50 MCG/ACT AEPB Inhale 1 puff into the lungs 2 (two) times daily. 180 each 1   lactulose, encephalopathy, (CHRONULAC) 10 GM/15ML SOLN Take 15 mLs (10 g total) by mouth 2 (two) times daily as needed for mild constipation. 946 mL 1   lidocaine (LIDODERM) 5 % Place 1 patch onto the skin daily. Remove & Discard patch within 12 hours or as directed by MD 30 patch 3   meloxicam (MOBIC) 7.5 MG tablet Take 2 tablets (15 mg total) by mouth daily as needed. 14 tablet 0   triamcinolone cream (KENALOG) 0.1 % Apply 1 application topically daily to arm for eczema 15 g 0   DULoxetine (CYMBALTA) 60 MG capsule TAKE 1 CAPSULE (60 MG TOTAL) BY MOUTH DAILY. 30 capsule 6   traZODone (DESYREL) 50 MG tablet TAKE 1 TABLET (50 MG TOTAL) BY MOUTH AT BEDTIME AS NEEDED. FOR SLEEP 30 tablet 6   gabapentin (NEURONTIN) 300 MG capsule TAKE 2 CAPSULES (600 MG TOTAL) BY MOUTH 2 (TWO) TIMES DAILY. 120 capsule 6   No facility-administered medications prior to visit.     ROS Review of Systems  Constitutional:  Negative for activity change and appetite change.  HENT:  Negative for sinus pressure and sore throat.   Respiratory:  Negative for chest tightness, shortness of breath and wheezing.   Cardiovascular:  Negative for chest pain and palpitations.  Gastrointestinal:  Positive for abdominal  pain. Negative for abdominal distention and constipation.  Genitourinary: Negative.   Musculoskeletal: Negative.   Psychiatric/Behavioral:  Negative for behavioral problems and dysphoric mood.    Objective:  BP 121/86   Pulse 70   Ht 5\' 1"  (1.549 m)   Wt 208 lb 12.8 oz (94.7 kg)   LMP 03/28/2016   SpO2 95%   BMI 39.45 kg/m      07/24/2023    2:06 PM 06/26/2023    4:40 PM 02/04/2023    1:52 PM  BP/Weight  Systolic BP 121 138 120  Diastolic BP 86 89 80  Wt. (Lbs) 208.8 210.8 208.8  BMI 39.45 kg/m2 39.83 kg/m2 39.45 kg/m2      Physical Exam Constitutional:      Appearance: She is well-developed.  Cardiovascular:     Rate and Rhythm: Normal rate.     Heart sounds: Normal heart sounds. No murmur heard. Pulmonary:     Effort: Pulmonary effort is normal.     Breath sounds: Normal breath sounds. No wheezing or rales.  Chest:     Chest wall: No tenderness.  Abdominal:     General: Bowel sounds are normal. There is no distension.     Palpations: Abdomen is soft. There is no mass.     Tenderness: There is abdominal tenderness (RLQ).  Musculoskeletal:     Lumbar back: Tenderness present. Positive right straight leg raise test and positive left straight leg raise test.     Right knee: Crepitus present. No bony tenderness.     Left knee: Crepitus present. No bony tenderness.     Right lower leg: No edema.     Left lower leg: No edema.  Neurological:  Mental Status: She is alert and oriented to person, place, and time.  Psychiatric:        Mood and Affect: Mood normal.        Latest Ref Rng & Units 04/25/2022    9:56 PM 01/31/2022    2:28 PM 02/09/2021   10:24 AM  CMP  Glucose 70 - 99 mg/dL 782  75  84   BUN 6 - 20 mg/dL 13  11  8    Creatinine 0.44 - 1.00 mg/dL 9.56  2.13  0.86   Sodium 135 - 145 mmol/L 144  142  140   Potassium 3.5 - 5.1 mmol/L 4.0  4.4  4.4   Chloride 98 - 111 mmol/L 109  104  101   CO2 22 - 32 mmol/L 28  24  24    Calcium 8.9 - 10.3 mg/dL 9.2  8.9   9.5   Total Protein 6.0 - 8.5 g/dL  6.7  7.0   Total Bilirubin 0.0 - 1.2 mg/dL  0.5  0.6   Alkaline Phos 44 - 121 IU/L  106  106   AST 0 - 40 IU/L  15  12   ALT 0 - 32 IU/L  12  9     Lipid Panel     Component Value Date/Time   CHOL 166 01/31/2022 1428   TRIG 72 01/31/2022 1428   HDL 50 01/31/2022 1428   CHOLHDL 4.3 02/09/2021 1024   CHOLHDL 3.0 05/15/2016 0949   VLDL 13 05/15/2016 0949   LDLCALC 102 (H) 01/31/2022 1428    CBC    Component Value Date/Time   WBC 7.6 04/25/2022 2156   RBC 4.30 04/25/2022 2156   HGB 11.3 (L) 04/25/2022 2156   HGB 11.5 01/31/2022 1428   HCT 34.5 (L) 04/25/2022 2156   HCT 35.5 01/31/2022 1428   PLT 334 04/25/2022 2156   PLT 344 01/31/2022 1428   MCV 80.2 04/25/2022 2156   MCV 78 (L) 01/31/2022 1428   MCH 26.3 04/25/2022 2156   MCHC 32.8 04/25/2022 2156   RDW 13.6 04/25/2022 2156   RDW 12.9 01/31/2022 1428   LYMPHSABS 2.0 01/31/2022 1428   MONOABS 0.4 06/26/2010 2057   EOSABS 0.1 01/31/2022 1428   BASOSABS 0.0 01/31/2022 1428    Lab Results  Component Value Date   HGBA1C 4.9 01/31/2022    Assessment & Plan:      Right Ovarian Cyst Reported cyst on right ovary identified at an outside hospital. Patient unable to provide detailed information or reports. No current vaginal bleeding. Mild tenderness on right lower quadrant on examination. -Request medical records from outside hospital for detailed information on cyst. -Referral to Gynecology placed per request  Rectal Bleeding Recent episode of rectal bleeding. Colonoscopy scheduled for upcoming Friday. -Continue with planned colonoscopy.  Depression and Bipolar Disorder Controlled Continues to see psychiatrist and reports adherence to medications. -Continue current psychiatric management.  Asthma Reports no recent flare-ups. Currently on Albuterol and Advair. -Refill Albuterol and Advair prescriptions.  Spinal stenosis, cervical radiculopathy Stable Management as per  [facility]  Vasomotor menopausal symptoms Stable on clonidine  General Health Maintenance -Order labs for kidney and liver function, and diabetes screening. -Encourage continued exercise regimen.          Meds ordered this encounter  Medications   albuterol (PROAIR HFA) 108 (90 Base) MCG/ACT inhaler    Sig: Inhale 2 puffs into the lungs every 6 (six) hours as needed for wheezing or shortness of breath.  Dispense:  18 g    Refill:  2   cloNIDine (CATAPRES) 0.2 MG tablet    Sig: Take 1 tablet (0.2 mg total) by mouth at bedtime.    Dispense:  90 tablet    Refill:  1   cyclobenzaprine (FLEXERIL) 10 MG tablet    Sig: Take 1 tablet (10 mg total) by mouth 2 (two) times daily as needed for muscle spasms    Dispense:  90 tablet    Refill:  1   fluticasone-salmeterol (ADVAIR DISKUS) 500-50 MCG/ACT AEPB    Sig: Inhale 1 puff into the lungs 2 (two) times daily.    Dispense:  180 each    Refill:  1   gabapentin (NEURONTIN) 300 MG capsule    Sig: Take 2 capsules (600 mg total) by mouth 2 (two) times daily.    Dispense:  120 capsule    Refill:  6   lactulose, encephalopathy, (CHRONULAC) 10 GM/15ML SOLN    Sig: Take 15 mLs (10 g total) by mouth 2 (two) times daily as needed for mild constipation.    Dispense:  946 mL    Refill:  1   lidocaine (LIDODERM) 5 %    Sig: Place 1 patch onto the skin daily. Remove & Discard patch within 12 hours or as directed by MD    Dispense:  30 patch    Refill:  3   meloxicam (MOBIC) 7.5 MG tablet    Sig: Take 1 tablet (7.5 mg total) by mouth daily as needed.    Dispense:  90 tablet    Refill:  1    PRN   nystatin (MYCOSTATIN) 100000 UNIT/ML suspension    Sig: Take 5 mLs (500,000 Units total) by mouth 4 (four) times daily.    Dispense:  120 mL    Refill:  0   triamcinolone cream (KENALOG) 0.1 %    Sig: Apply 1 application topically daily to arm for eczema    Dispense:  15 g    Refill:  0    Follow-up: Return in about 6 months  (around 01/21/2024) for Chronic medical conditions.       Hoy Register, MD, FAAFP. [facility] [place], [place] [phone]   07/24/2023, 3:33 PM

## 2023-07-25 LAB — CMP14+EGFR
ALT: 7 [IU]/L (ref 0–32)
AST: 14 [IU]/L (ref 0–40)
Albumin: 4.2 g/dL (ref 3.8–4.9)
Alkaline Phosphatase: 96 [IU]/L (ref 44–121)
BUN/Creatinine Ratio: 11 (ref 9–23)
BUN: 8 mg/dL (ref 6–24)
Bilirubin Total: 0.4 mg/dL (ref 0.0–1.2)
CO2: 28 mmol/L (ref 20–29)
Calcium: 9.2 mg/dL (ref 8.7–10.2)
Chloride: 104 mmol/L (ref 96–106)
Creatinine, Ser: 0.75 mg/dL (ref 0.57–1.00)
Globulin, Total: 2.7 g/dL (ref 1.5–4.5)
Glucose: 87 mg/dL (ref 70–99)
Potassium: 4.3 mmol/L (ref 3.5–5.2)
Sodium: 144 mmol/L (ref 134–144)
Total Protein: 6.9 g/dL (ref 6.0–8.5)
eGFR: 95 mL/min/{1.73_m2} (ref >59)

## 2023-07-25 LAB — HEMOGLOBIN A1C
Est. average glucose Bld gHb Est-mCnc: 100 mg/dL
Hgb A1c MFr Bld: 5.1 % (ref 4.8–5.6)

## 2023-08-07 ENCOUNTER — Other Ambulatory Visit: Payer: Self-pay

## 2023-09-26 ENCOUNTER — Other Ambulatory Visit: Payer: Self-pay

## 2023-10-31 ENCOUNTER — Ambulatory Visit: Payer: MEDICAID | Admitting: Obstetrics and Gynecology

## 2023-10-31 ENCOUNTER — Encounter: Payer: Self-pay | Admitting: Obstetrics and Gynecology

## 2023-10-31 VITALS — BP 116/78 | HR 64 | Ht 61.0 in | Wt 210.0 lb

## 2023-10-31 DIAGNOSIS — N83209 Unspecified ovarian cyst, unspecified side: Secondary | ICD-10-CM

## 2023-10-31 DIAGNOSIS — Z1231 Encounter for screening mammogram for malignant neoplasm of breast: Secondary | ICD-10-CM | POA: Diagnosis not present

## 2023-10-31 DIAGNOSIS — Z1211 Encounter for screening for malignant neoplasm of colon: Secondary | ICD-10-CM

## 2023-10-31 NOTE — Progress Notes (Signed)
Pt is in office for ovarian cyst. Pt complains of lower back pain.

## 2023-10-31 NOTE — Progress Notes (Signed)
53 yo P3 postmenopausal since the age of 74 and BMI 4 presenting for the evaluation of an ovarian cyst. Patient states the ovarian cyst was discovered several years ago while she was still having her periods. She states no follow up was recommended as it was deemed benign. Patient desires a follow up evaluation as it was unclear to her what the diagnosis is. She is without any current complaints. She denies pelvic pain or abnormal discharge. She denies any episodes of postmenopausal vaginal bleeding. She denies urinary incontinence and admits to some constipation.  Past Medical History:  Diagnosis Date   Allergy    pollen, pcn   Anxiety    Asthma    Cataract    COPD (chronic obstructive pulmonary disease) (HCC)    Depression    Fibroids    Glaucoma    History of ovarian cyst    Medical history non-contributory    sickle cell   Menometrorrhagia    Sickle cell anemia (HCC)    has the trait   Sickle cell trait (HCC)    Past Surgical History:  Procedure Laterality Date   CATARACT EXTRACTION, BILATERAL     CESAREAN SECTION     all three pregnancies   COLONOSCOPY  07/04/2021   Family History  Problem Relation Age of Onset   Cancer Mother        colon cancer   Colon polyps Mother    Hypertension Mother    Heart disease Father    Cancer Father        cancer   Other Neg Hx    Esophageal cancer Neg Hx    Rectal cancer Neg Hx    Stomach cancer Neg Hx    Social History   Tobacco Use   Smoking status: Former    Current packs/day: 0.00    Types: Cigarettes    Quit date: 02/14/2016    Years since quitting: 7.7   Smokeless tobacco: Never   Tobacco comments:    3 cigarettes daily 06-22-21  Vaping Use   Vaping status: Some Days  Substance Use Topics   Alcohol use: No    Comment: occ   Drug use: No   ROS See pertinent in HPI. All other systems reviewed and non contributory Blood pressure 116/78, pulse 64, height 5\' 1"  (1.549 m), weight 210 lb (95.3 kg), last menstrual period  03/28/2016. GENERAL: Well-developed, well-nourished female in no acute distress.  ABDOMEN: Soft, tenderness below umbilicus, nondistended. Central obesity PELVIC: Normal external female genitalia. Vagina is pink and rugated.  Normal discharge. Normal appearing cervix. Bimanual exam limited secondary to body habitus. Tenderness in suprapubic region. Chaperone present during the pelvic exam EXTREMITIES: No cyanosis, clubbing, or edema, 2+ distal pulses.  A/P 53 yo postmenopausal with history of ovarian cyst - pelvic ultrasound ordered - Screening mammogram ordered - Patient scheduled for colonoscopy in May - Patient is current on pap smear - Patient will be contacted with results and further management as indicated

## 2023-11-08 ENCOUNTER — Ambulatory Visit
Admission: RE | Admit: 2023-11-08 | Discharge: 2023-11-08 | Disposition: A | Discharge status: Home / Self Care | Payer: MEDICAID | Source: Ambulatory Visit | Attending: Obstetrics and Gynecology | Admitting: Obstetrics and Gynecology

## 2023-11-08 ENCOUNTER — Ambulatory Visit (HOSPITAL_BASED_OUTPATIENT_CLINIC_OR_DEPARTMENT_OTHER)
Admission: RE | Admit: 2023-11-08 | Discharge: 2023-11-08 | Disposition: A | Discharge status: Home / Self Care | Payer: MEDICAID | Source: Ambulatory Visit | Attending: Obstetrics and Gynecology | Admitting: Obstetrics and Gynecology

## 2023-11-08 DIAGNOSIS — Z1231 Encounter for screening mammogram for malignant neoplasm of breast: Secondary | ICD-10-CM

## 2023-11-08 DIAGNOSIS — N83209 Unspecified ovarian cyst, unspecified side: Secondary | ICD-10-CM | POA: Insufficient documentation

## 2023-11-09 ENCOUNTER — Other Ambulatory Visit: Payer: Self-pay | Admitting: Obstetrics and Gynecology

## 2023-11-09 DIAGNOSIS — Z1231 Encounter for screening mammogram for malignant neoplasm of breast: Secondary | ICD-10-CM

## 2023-11-09 DIAGNOSIS — N83209 Unspecified ovarian cyst, unspecified side: Secondary | ICD-10-CM

## 2023-11-09 DIAGNOSIS — Z1211 Encounter for screening for malignant neoplasm of colon: Secondary | ICD-10-CM

## 2023-11-13 ENCOUNTER — Other Ambulatory Visit: Payer: Self-pay | Admitting: Obstetrics and Gynecology

## 2023-11-13 DIAGNOSIS — R928 Other abnormal and inconclusive findings on diagnostic imaging of breast: Secondary | ICD-10-CM

## 2023-11-16 ENCOUNTER — Encounter: Payer: Self-pay | Admitting: Obstetrics and Gynecology

## 2023-11-21 ENCOUNTER — Other Ambulatory Visit: Payer: MEDICAID

## 2023-11-21 DIAGNOSIS — N83209 Unspecified ovarian cyst, unspecified side: Secondary | ICD-10-CM

## 2023-11-22 ENCOUNTER — Encounter: Payer: Self-pay | Admitting: Obstetrics and Gynecology

## 2023-11-22 LAB — CA 125: Cancer Antigen (CA) 125: 11.7 U/mL (ref 0.0–38.1)

## 2023-11-29 ENCOUNTER — Ambulatory Visit
Admission: RE | Admit: 2023-11-29 | Discharge: 2023-11-29 | Disposition: A | Discharge status: Home / Self Care | Payer: MEDICAID | Source: Ambulatory Visit | Attending: Obstetrics and Gynecology | Admitting: Obstetrics and Gynecology

## 2023-11-29 ENCOUNTER — Other Ambulatory Visit: Payer: Self-pay | Admitting: Obstetrics and Gynecology

## 2023-11-29 DIAGNOSIS — R921 Mammographic calcification found on diagnostic imaging of breast: Secondary | ICD-10-CM

## 2023-11-29 DIAGNOSIS — R928 Other abnormal and inconclusive findings on diagnostic imaging of breast: Secondary | ICD-10-CM

## 2023-11-30 ENCOUNTER — Ambulatory Visit (HOSPITAL_COMMUNITY): Payer: MEDICAID

## 2023-12-05 ENCOUNTER — Ambulatory Visit
Admission: RE | Admit: 2023-12-05 | Discharge: 2023-12-05 | Disposition: A | Discharge status: Home / Self Care | Payer: MEDICAID | Source: Ambulatory Visit | Attending: Obstetrics and Gynecology | Admitting: Obstetrics and Gynecology

## 2023-12-05 DIAGNOSIS — R921 Mammographic calcification found on diagnostic imaging of breast: Secondary | ICD-10-CM

## 2023-12-05 HISTORY — PX: BREAST BIOPSY: SHX20

## 2023-12-06 ENCOUNTER — Ambulatory Visit (HOSPITAL_COMMUNITY)
Admission: RE | Admit: 2023-12-06 | Discharge: 2023-12-06 | Disposition: A | Discharge status: Home / Self Care | Payer: MEDICAID | Source: Ambulatory Visit | Attending: Obstetrics and Gynecology | Admitting: Obstetrics and Gynecology

## 2023-12-06 DIAGNOSIS — N83209 Unspecified ovarian cyst, unspecified side: Secondary | ICD-10-CM | POA: Insufficient documentation

## 2023-12-06 LAB — SURGICAL PATHOLOGY

## 2023-12-06 MED ORDER — GADOBUTROL 1 MMOL/ML IV SOLN
9.0000 mL | Freq: Once | INTRAVENOUS | Status: AC | PRN
  Administered 2023-12-06: 9 mL via INTRAVENOUS

## 2023-12-13 ENCOUNTER — Other Ambulatory Visit: Payer: Self-pay | Admitting: General Surgery

## 2023-12-13 DIAGNOSIS — C50112 Malignant neoplasm of central portion of left female breast: Secondary | ICD-10-CM

## 2023-12-16 ENCOUNTER — Other Ambulatory Visit: Payer: Self-pay | Admitting: Obstetrics and Gynecology

## 2023-12-16 DIAGNOSIS — N83202 Unspecified ovarian cyst, left side: Secondary | ICD-10-CM

## 2023-12-17 ENCOUNTER — Telehealth: Payer: Self-pay

## 2023-12-17 ENCOUNTER — Telehealth: Payer: Self-pay | Admitting: Emergency Medicine

## 2023-12-17 ENCOUNTER — Encounter: Payer: Self-pay | Admitting: *Deleted

## 2023-12-17 DIAGNOSIS — D0512 Intraductal carcinoma in situ of left breast: Secondary | ICD-10-CM

## 2023-12-17 NOTE — Telephone Encounter (Signed)
 Discussed results with patient. Patient informed of GYN ONC referral.

## 2023-12-17 NOTE — Telephone Encounter (Signed)
 Spoke with [name] regarding her referral to GYN oncology. She has an appointment scheduled with Dr. Alvester Morin on 01/06/24 at 11:15. Patient agrees to date and time. She has been provided with office address and location. She is also aware of our mask and visitor policy. Patient verbalized understanding and will call with any questions.

## 2023-12-19 ENCOUNTER — Telehealth: Payer: Self-pay | Admitting: *Deleted

## 2023-12-19 ENCOUNTER — Other Ambulatory Visit: Payer: Self-pay | Admitting: *Deleted

## 2023-12-19 NOTE — Telephone Encounter (Signed)
 Called pt and provided navigation resources and contact information. Discussed new pt appts with Dr. Mitzi Hansen and Dr. Al Pimple. Confirmed date and time. Informed Dr. Arita Miss office will call with sx date and instructions. Received verbal understanding. Denies further questions or needs.

## 2023-12-20 ENCOUNTER — Other Ambulatory Visit: Payer: Self-pay | Admitting: General Surgery

## 2023-12-20 DIAGNOSIS — C50112 Malignant neoplasm of central portion of left female breast: Secondary | ICD-10-CM

## 2023-12-24 ENCOUNTER — Ambulatory Visit
Admission: RE | Admit: 2023-12-24 | Discharge: 2023-12-24 | Disposition: A | Discharge status: Home / Self Care | Payer: MEDICAID | Source: Ambulatory Visit | Attending: Radiation Oncology | Admitting: Radiation Oncology

## 2023-12-24 ENCOUNTER — Encounter: Payer: Self-pay | Admitting: Radiation Oncology

## 2023-12-24 VITALS — BP 144/112 | HR 81 | Temp 97.3°F | Resp 18 | Ht 61.0 in | Wt 217.0 lb

## 2023-12-24 DIAGNOSIS — J45909 Unspecified asthma, uncomplicated: Secondary | ICD-10-CM | POA: Diagnosis not present

## 2023-12-24 DIAGNOSIS — Z7951 Long term (current) use of inhaled steroids: Secondary | ICD-10-CM | POA: Diagnosis not present

## 2023-12-24 DIAGNOSIS — Z801 Family history of malignant neoplasm of trachea, bronchus and lung: Secondary | ICD-10-CM | POA: Diagnosis not present

## 2023-12-24 DIAGNOSIS — D571 Sickle-cell disease without crisis: Secondary | ICD-10-CM | POA: Insufficient documentation

## 2023-12-24 DIAGNOSIS — N920 Excessive and frequent menstruation with regular cycle: Secondary | ICD-10-CM | POA: Diagnosis not present

## 2023-12-24 DIAGNOSIS — Z87891 Personal history of nicotine dependence: Secondary | ICD-10-CM | POA: Insufficient documentation

## 2023-12-24 DIAGNOSIS — J449 Chronic obstructive pulmonary disease, unspecified: Secondary | ICD-10-CM | POA: Diagnosis not present

## 2023-12-24 DIAGNOSIS — Z79899 Other long term (current) drug therapy: Secondary | ICD-10-CM | POA: Diagnosis not present

## 2023-12-24 DIAGNOSIS — Z7952 Long term (current) use of systemic steroids: Secondary | ICD-10-CM | POA: Insufficient documentation

## 2023-12-24 DIAGNOSIS — D0512 Intraductal carcinoma in situ of left breast: Secondary | ICD-10-CM | POA: Insufficient documentation

## 2023-12-24 NOTE — Progress Notes (Signed)
 Radiation Oncology         [phone]) [phone] ________________________________  Name: [name] MRN: [id number]  Date: 12/24/2023  DOB: Jul 07, 1971  WU:[id number], Odette Horns, MD  Almond Lint, MD     REFERRING PHYSICIAN: Almond Lint, MD   DIAGNOSIS: The encounter diagnosis was Ductal carcinoma in situ (DCIS) of left breast.   HISTORY OF PRESENT ILLNESS::[name] is a 53 y.o. female who is seen for an initial consultation visit regarding the patient's diagnosis of left-sided breast cancer.  The patient was noted to have an abnormality within the left breast on screening mammography.  Follow-up imaging demonstrated a area of calcifications in the left retroareolar region measuring 0.4 cm.  A biopsy was performed which returned positive for grade 3 DCIS.  The patient has seen surgery who is recommending breast conservation treatment.  I have been asked to see the patient for consideration of adjuvant radiation treatment at the appropriate time.    PREVIOUS RADIATION THERAPY: No   PAST MEDICAL HISTORY:  has a past medical history of Allergy, Anxiety, Asthma, Cataract, COPD (chronic obstructive pulmonary disease) (HCC), Depression, Fibroids, Glaucoma, History of ovarian cyst, Medical history non-contributory, Menometrorrhagia, Sickle cell anemia (HCC), and Sickle cell trait (HCC).     PAST SURGICAL HISTORY: Past Surgical History:  Procedure Laterality Date   BREAST BIOPSY Left 12/05/2023   MM LT BREAST BX W LOC DEV 1ST LESION IMAGE BX SPEC STEREO GUIDE 12/05/2023 [facility]   CATARACT EXTRACTION, BILATERAL     CESAREAN SECTION     all three pregnancies   COLONOSCOPY  07/04/2021     FAMILY HISTORY: family history includes Breast cancer in her maternal grandmother, mother, and paternal grandmother; Cancer in her mother; Colon polyps in her mother; Heart disease in her father; Hypertension in her mother; Lung cancer in her maternal grandfather; Throat cancer in her  father.   SOCIAL HISTORY:  reports that she quit smoking about 7 years ago. Her smoking use included cigarettes. She has never used smokeless tobacco. She reports that she does not drink alcohol and does not use drugs.   ALLERGIES: Bupropion hcl er (sr), Fruit & vegetable daily [nutritional supplements], Penicillins, Pollen extract-tree extract, and Other   MEDICATIONS:  Current Outpatient Medications  Medication Sig Dispense Refill   albuterol (PROAIR HFA) 108 (90 Base) MCG/ACT inhaler Inhale 2 puffs into the lungs every 6 (six) hours as needed for wheezing or shortness of breath. 18 g 2   brimonidine-timolol (COMBIGAN) 0.2-0.5 % ophthalmic solution Place 1 drop into both eyes every 12 (twelve) hours.     busPIRone (BUSPAR) 15 MG tablet Take 1 tablet by mouth 2 times per day 60 tablet 2   cariprazine (VRAYLAR) 1.5 MG capsule Take 2 capsules by mouth daily. 60 capsule 2   cloNIDine (CATAPRES) 0.2 MG tablet Take 1 tablet (0.2 mg total) by mouth at bedtime. 90 tablet 1   clotrimazole (LOTRIMIN) 1 % cream Apply 1 application topically 2 (two) times daily. 45 g 1   cyclobenzaprine (FLEXERIL) 10 MG tablet Take 1 tablet (10 mg total) by mouth 2 (two) times daily as needed for muscle spasms 90 tablet 1   fluticasone-salmeterol (ADVAIR DISKUS) 500-50 MCG/ACT AEPB Inhale 1 puff into the lungs 2 (two) times daily. 180 each 1   furosemide (LASIX) 20 MG tablet Take 1 tablet (20 mg total) by mouth daily as needed. 30 tablet 1   gabapentin (NEURONTIN) 300 MG capsule Take 2 capsules (600 mg total) by mouth 2 (  two) times daily. 120 capsule 6   lactulose, encephalopathy, (CHRONULAC) 10 GM/15ML SOLN Take 15 mLs (10 g total) by mouth 2 (two) times daily as needed for mild constipation. 946 mL 1   levocetirizine (XYZAL) 5 MG tablet Take 1 tablet (5 mg total) by mouth every evening. 30 tablet 2   lidocaine (LIDODERM) 5 % Place 1 patch onto the skin daily. Remove & Discard patch within 12 hours or as directed by  MD 30 patch 3   meloxicam (MOBIC) 7.5 MG tablet Take 1 tablet (7.5 mg total) by mouth daily as needed. 90 tablet 1   Misc. Devices MISC 4-pronged cane.  Diagnosis-spinal stenosis 1 each 0   Misc. Devices MISC Rollator walker with seat.  Diagnosis spinal stenosis 1 each 0   naloxone (NARCAN) nasal spray 4 mg/0.1 mL 1 (ONE) SPRAY BY NOSE AS NEEDED, IF FOUND UNRESPONSIVE THEN SPRAY INTO NOSE AND CALL 911     nystatin (MYCOSTATIN) 100000 UNIT/ML suspension Take 5 mLs (500,000 Units total) by mouth 4 (four) times daily. 120 mL 0   oxyCODONE-acetaminophen (PERCOCET) 10-325 MG tablet Take 1 tablet by mouth 3 (three) times daily as needed. Takes 4 times daily     oxyCODONE-acetaminophen (PERCOCET) 10-325 MG tablet Take 1 tablet by mouth 4 (four) times daily as needed. 120 tablet 0   prazosin (MINIPRESS) 1 MG capsule Take 1 capsule by mouth at bedtime for nightmares 30 capsule 2   predniSONE (DELTASONE) 20 MG tablet Take 1 tablet (20 mg total) by mouth daily with breakfast. 5 tablet 0   traZODone (DESYREL) 50 MG tablet TAKE 1 TABLET (50 MG TOTAL) BY MOUTH AT BEDTIME AS NEEDED. FOR SLEEP 30 tablet 6   triamcinolone cream (KENALOG) 0.1 % Apply 1 application topically daily to arm for eczema 15 g 0   varenicline (CHANTIX) 0.5 MG tablet Take 0.5 mg by mouth 3 (three) times daily.     DULoxetine (CYMBALTA) 60 MG capsule TAKE 1 CAPSULE (60 MG TOTAL) BY MOUTH DAILY. 30 capsule 6   ibuprofen (ADVIL) 800 MG tablet Take 1 tablet (800 mg total) by mouth 3 (three) times daily. (Patient not taking: Reported on 12/24/2023) 21 tablet 0   No current facility-administered medications for this encounter.     REVIEW OF SYSTEMS:  A 15 point review of systems is documented in the electronic medical record. This was obtained by the nursing staff. However, I reviewed this with the patient to discuss relevant findings and make appropriate changes.  Pertinent items are noted in HPI.    PHYSICAL EXAM:  height is 5\' 1"  (1.549 m)  and weight is 217 lb (98.4 kg). Her temporal temperature is 97.3 F (36.3 C) (abnormal). Her blood pressure is 144/112 (abnormal) and her pulse is 81. Her respiration is 18 and oxygen saturation is 99%.   ECOG = 0  0 - Asymptomatic (Fully active, able to carry on all predisease activities without restriction)  1 - Symptomatic but completely ambulatory (Restricted in physically strenuous activity but ambulatory and able to carry out work of a light or sedentary nature. For example, light housework, office work)  2 - Symptomatic, <50% in bed during the day (Ambulatory and capable of all self care but unable to carry out any work activities. Up and about more than 50% of waking hours)  3 - Symptomatic, >50% in bed, but not bedbound (Capable of only limited self-care, confined to bed or chair 50% or more of waking hours)  4 - Bedbound (  Completely disabled. Cannot carry on any self-care. Totally confined to bed or chair)  5 - Death   Santiago Glad MM, Creech RH, Tormey DC, et al. [phone]). "Toxicity and response criteria of the [facility]". Am. Evlyn Clines. Oncol. 5 (6): 649-55  Alert, no acute distress   LABORATORY DATA:  Lab Results  Component Value Date   WBC 7.6 04/25/2022   HGB 11.3 (L) 04/25/2022   HCT 34.5 (L) 04/25/2022   MCV 80.2 04/25/2022   PLT 334 04/25/2022   Lab Results  Component Value Date   NA 144 07/24/2023   K 4.3 07/24/2023   CL 104 07/24/2023   CO2 28 07/24/2023   Lab Results  Component Value Date   ALT 7 07/24/2023   AST 14 07/24/2023   ALKPHOS 96 07/24/2023   BILITOT 0.4 07/24/2023      RADIOGRAPHY: MR PELVIS W WO CONTRAST Result Date: 12/15/2023 CLINICAL DATA:  Ovarian mass, history of breast cancer EXAM: MRI PELVIS WITHOUT AND WITH CONTRAST TECHNIQUE: Multiplanar multisequence MR imaging of the pelvis was performed both before and after administration of intravenous contrast. CONTRAST:  9mL GADAVIST GADOBUTROL 1 MMOL/ML IV SOLN COMPARISON:   Pelvic ultrasound, 11/08/2023 FINDINGS: Urinary Tract:  No abnormality visualized. Bowel:  Unremarkable visualized pelvic bowel loops. Vascular/Lymphatic: No pathologically enlarged lymph nodes. No significant vascular abnormality seen. Reproductive: Large mixed solid and cystic mass situated in the anterior pelvis, and arising from the left ovary, measuring 11.3 x 9.2 x 8.9 cm (series 5, image 17, series 6, image 16). There is a dominant cystic component anteriorly and posteriorly a large, complex solid component with low-level internal contrast enhancement. Normal postmenopausal appearance of the right ovary. The uterus is retroflexed by mass effect but otherwise normal in postmenopausal appearance. Other: Moderate volume free fluid in the pelvis. Nonenhancing rounded peritoneal body in the dependent low pelvis adjacent to the rectum measuring 1.3 cm (series 5, image 21). Musculoskeletal: No suspicious bone lesions identified. IMPRESSION: 1. Large mixed solid and cystic mass situated in the anterior pelvis, and arising from the left ovary, measuring 11.3 x 9.2 x 8.9 cm. There is a dominant cystic component anteriorly and posteriorly a large, complex solid component with low-level internal contrast enhancement. 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 2. Moderate volume free fluid in the pelvis. Nonenhancing rounded peritoneal body in the dependent low pelvis adjacent to the rectum measuring 1.3 cm, of uncertain nature, possibly an epiploic appendage or dropped gallstone although concerning for a peritoneal metastasis in this setting. 3. No evidence of lymphadenopathy or other metastatic disease in the pelvis. These results will be called to the ordering clinician or representative by the Radiologist Assistant, and communication documented in the PACS or [employer]. Electronically Signed   By: Jearld Lesch M.D.   On: 12/15/2023 07:39   MM LT BREAST BX W LOC DEV 1ST LESION IMAGE BX SPEC  STEREO GUIDE Addendum Date: 12/10/2023 ADDENDUM REPORT: 12/10/2023 07:22 ADDENDUM: Pathology revealed HIGH-GRADE DUCTAL CARCINOMA IN SITU, SOLID AND CLINGING TYPES WITH NECROSIS AND CALCIFICATIONS, NEGATIVE FOR INVASIVE CARCINOMA of the LEFT breast, retroareolar, (x clip) . This was found to be concordant by Dr. Edwin Cap. Pathology results were discussed with the patient by telephone and her daughter, per request. The patient reported doing well after the biopsy with tenderness at the site. Post biopsy instructions and care were reviewed and questions were answered. The patient was encouraged to call [facility] for any additional concerns.  My direct phone number was provided. Surgical consultation has been arranged with Dr. Almond Lint at [facility] on December 13, 2023. Consideration for a bilateral breast MRI for further evaluation of extent of disease given the high grade histology. Pathology results reported by Rene Kocher, RN on 12/09/2023. Electronically Signed   By: Edwin Cap M.D.   On: 12/10/2023 07:22   Result Date: 12/10/2023 CLINICAL DATA:  Patient presents for stereotactic guided biopsy of a 0.3 cm group of calcifications in the retroareolar left breast. EXAM: BREAST STEREOTACTIC CORE NEEDLE BIOPSY COMPARISON:  Previous exam(s). FINDINGS: The patient and I discussed the procedure of stereotactic-guided biopsy including benefits and alternatives. We discussed the high likelihood of a successful procedure. We discussed the risks of the procedure including infection, bleeding, tissue injury, clip migration, and inadequate sampling. Informed written consent was given. The usual time out protocol was performed immediately prior to the procedure. Using sterile technique and 1% Lidocaine as local anesthetic, under stereotactic guidance, a 9 gauge vacuum assisted device was used to perform core needle biopsy of calcifications in the retroareolar left  breast using a lateral to medial approach. Specimen radiograph was performed showing the presence of calcifications. Specimens with calcifications are identified for pathology. Lesion quadrant: Lower inner At the conclusion of the procedure, an X shaped tissue marker clip was deployed into the biopsy cavity. Follow-up 2-view mammogram was performed and dictated separately. IMPRESSION: Stereotactic-guided biopsy of the calcifications in the retroareolar left breast. No apparent complications. Electronically Signed: By: Edwin Cap M.D. On: 12/05/2023 09:01   MM CLIP PLACEMENT LEFT Result Date: 12/05/2023 CLINICAL DATA:  Post stereotactic guided biopsy of calcifications in the retroareolar left breast. EXAM: 3D DIAGNOSTIC LEFT MAMMOGRAM POST STEREOTACTIC BIOPSY COMPARISON:  Previous exam(s). ACR Breast Density Category a: The breasts are almost entirely fatty. FINDINGS: 3D Mammographic images were obtained following stereotactic guided core biopsy of calcifications in the retroareolar left breast. An X shaped biopsy marking clip is present at the site of the biopsied calcifications in the retroareolar left breast. A small post biopsy hematoma is present. IMPRESSION: X shaped biopsy marking clip at site of biopsied calcifications in the retroareolar left breast. Final Assessment: Post Procedure Mammograms for Marker Placement Electronically Signed   By: Edwin Cap M.D.   On: 12/05/2023 09:00   MM Digital Diagnostic Unilat L Result Date: 11/29/2023 CLINICAL DATA:  Recall from screening for left breast calcifications. EXAM: DIGITAL DIAGNOSTIC UNILATERAL LEFT MAMMOGRAM WITH CAD TECHNIQUE: Left digital diagnostic mammography was performed. COMPARISON:  Previous exam(s). ACR Breast Density Category b: There are scattered areas of fibroglandular density. FINDINGS: There is a 4 mm group of coarse heterogeneous calcifications that appears reside within a mildly distended duct in the retroareolar left breast.  IMPRESSION: 1. 4 mm group of suspicious calcifications in the retroareolar left breast. Tissue sampling is recommended. RECOMMENDATION: 1. Stereotactic core needle biopsy of the left breast calcifications. I have discussed the findings and recommendations with the patient. If applicable, a reminder letter will be sent to the patient regarding the next appointment. BI-RADS CATEGORY  4: Suspicious. Electronically Signed   By: Amie Portland M.D.   On: 11/29/2023 15:51       IMPRESSION/ PLAN:  The patient has a recent diagnosis of a grade 3 DCIS of the left breast representing a small subcentimeter area.  The patient is a good candidate for breast conservation treatment and this is being scheduled in the near future.  I discussed with the patient a  recommendation to proceed with adjuvant radiation treatment at the right time.  Ideally, we would like to aim for a course of standard hypofractionation if we can meet our dosimetric goals.  We discussed the rationale of treatment as well as possible side effects and risks.  All of her questions were answered and the patient indicated that she wished to proceed with such a treatment at the appropriate time.  I look forward to seeing her postoperatively to help coordinate her care.  The patient was seen in person today in clinic.  The total time spent on the patient's visit today was 50 minutes, including chart review, direct discussion/evaluation with the patient, and coordination of care.        ________________________________   Radene Gunning, MD, PhD   **Disclaimer: This note was dictated with voice recognition software. Similar sounding words can inadvertently be transcribed and this note may contain transcription errors which may not have been corrected upon publication of note.**

## 2023-12-24 NOTE — Progress Notes (Signed)
 New Breast Cancer Diagnosis: Left Breast   Patient had screening mammogram which detected left breast calcifications.  Diagnostic imaging noted a 4 mm group of calcifications in the retro-areolar region of the left breast.   Histology per Pathology Report: grade 3, DCIS  There was insufficient residual tumor within the deeper prognostic marker immunohistochemical stains to determine hormone status of the tumor.   Surgeon and surgical plan, if any:  Dr. Donell Beers 12/13/2023 - Left Breast seed localized lumpectomy 01/09/2024   Medical oncologist, treatment if any:   Dr. Al Pimple 01/10/2024   Family History of Breast/Ovarian/Prostate Cancer: 2 grandmothers had breast.  Lymphedema issues, if any:  None    Pain issues, if any: Pain and Tenderness since biopsy    SAFETY ISSUES: Prior radiation? No Pacemaker/ICD? No Possible current pregnancy? Postmenopausal Is the patient on methotrexate? No  Current Complaints / other details:

## 2023-12-26 ENCOUNTER — Telehealth: Payer: Self-pay | Admitting: Licensed Clinical Social Worker

## 2023-12-26 NOTE — Telephone Encounter (Signed)
 [facility] Clinical Social Work  Clinical Social Work was referred by nurse for assessment of psychosocial needs.  Clinical Social Worker attempted to contact patient by phone to offer support and assess for needs.   No answer. Left VM with direct contact information and brief description of support services.     Logyn Dedominicis E Alieah Brinton, LCSW  Clinical Social Worker [facility]        Patient is participating in a Managed Medicaid Plan:  Yes

## 2023-12-31 ENCOUNTER — Telehealth: Payer: Self-pay | Admitting: Licensed Clinical Social Worker

## 2023-12-31 ENCOUNTER — Inpatient Hospital Stay: Payer: MEDICAID | Attending: Hematology and Oncology | Admitting: Licensed Clinical Social Worker

## 2023-12-31 DIAGNOSIS — Z8041 Family history of malignant neoplasm of ovary: Secondary | ICD-10-CM | POA: Insufficient documentation

## 2023-12-31 DIAGNOSIS — Z79891 Long term (current) use of opiate analgesic: Secondary | ICD-10-CM | POA: Insufficient documentation

## 2023-12-31 DIAGNOSIS — Z5986 Financial insecurity: Secondary | ICD-10-CM | POA: Insufficient documentation

## 2023-12-31 DIAGNOSIS — G8929 Other chronic pain: Secondary | ICD-10-CM | POA: Insufficient documentation

## 2023-12-31 DIAGNOSIS — I10 Essential (primary) hypertension: Secondary | ICD-10-CM | POA: Insufficient documentation

## 2023-12-31 DIAGNOSIS — D3912 Neoplasm of uncertain behavior of left ovary: Secondary | ICD-10-CM | POA: Insufficient documentation

## 2023-12-31 DIAGNOSIS — N959 Unspecified menopausal and perimenopausal disorder: Secondary | ICD-10-CM | POA: Insufficient documentation

## 2023-12-31 DIAGNOSIS — F419 Anxiety disorder, unspecified: Secondary | ICD-10-CM | POA: Insufficient documentation

## 2023-12-31 DIAGNOSIS — E669 Obesity, unspecified: Secondary | ICD-10-CM | POA: Insufficient documentation

## 2023-12-31 DIAGNOSIS — Z6839 Body mass index (BMI) 39.0-39.9, adult: Secondary | ICD-10-CM | POA: Insufficient documentation

## 2023-12-31 DIAGNOSIS — D573 Sickle-cell trait: Secondary | ICD-10-CM | POA: Insufficient documentation

## 2023-12-31 DIAGNOSIS — Z87891 Personal history of nicotine dependence: Secondary | ICD-10-CM | POA: Insufficient documentation

## 2023-12-31 DIAGNOSIS — Z888 Allergy status to other drugs, medicaments and biological substances status: Secondary | ICD-10-CM | POA: Insufficient documentation

## 2023-12-31 DIAGNOSIS — M1990 Unspecified osteoarthritis, unspecified site: Secondary | ICD-10-CM | POA: Insufficient documentation

## 2023-12-31 DIAGNOSIS — Z01818 Encounter for other preprocedural examination: Secondary | ICD-10-CM | POA: Insufficient documentation

## 2023-12-31 DIAGNOSIS — Z79899 Other long term (current) drug therapy: Secondary | ICD-10-CM | POA: Insufficient documentation

## 2023-12-31 DIAGNOSIS — Z83719 Family history of colon polyps, unspecified: Secondary | ICD-10-CM | POA: Insufficient documentation

## 2023-12-31 DIAGNOSIS — Z880 Allergy status to penicillin: Secondary | ICD-10-CM | POA: Insufficient documentation

## 2023-12-31 DIAGNOSIS — Z808 Family history of malignant neoplasm of other organs or systems: Secondary | ICD-10-CM | POA: Insufficient documentation

## 2023-12-31 DIAGNOSIS — D0512 Intraductal carcinoma in situ of left breast: Secondary | ICD-10-CM | POA: Insufficient documentation

## 2023-12-31 DIAGNOSIS — Z800 Family history of malignant neoplasm of digestive organs: Secondary | ICD-10-CM | POA: Insufficient documentation

## 2023-12-31 DIAGNOSIS — Z803 Family history of malignant neoplasm of breast: Secondary | ICD-10-CM | POA: Insufficient documentation

## 2023-12-31 DIAGNOSIS — Z801 Family history of malignant neoplasm of trachea, bronchus and lung: Secondary | ICD-10-CM | POA: Insufficient documentation

## 2023-12-31 DIAGNOSIS — J449 Chronic obstructive pulmonary disease, unspecified: Secondary | ICD-10-CM | POA: Insufficient documentation

## 2023-12-31 DIAGNOSIS — Z8249 Family history of ischemic heart disease and other diseases of the circulatory system: Secondary | ICD-10-CM | POA: Insufficient documentation

## 2023-12-31 NOTE — Telephone Encounter (Signed)
 [facility] Clinical Social Work  CSW attempted to contact pt by phone regarding referral from medical team. Pt answered but was unable to speak at this time.  Agreed to a call back around 3-3:30pm today.   Jahni Nazar E Taelynn Mcelhannon, LCSW

## 2023-12-31 NOTE — Progress Notes (Signed)
 [facility] Clinical Social Work  Initial Assessment   [name] is a 53 y.o. year old female contacted by phone. Clinical Social Work was referred by medical provider for assessment of psychosocial needs.   SDOH (Social Determinants of Health) assessments performed: Yes SDOH Interventions    Flowsheet Row Clinical Support from 12/31/2023 in [facility] - A Dept Of [place]. [facility] Office Visit from 06/26/2023 in [facility] [place] - A Dept Of [place]. [facility] Office Visit from 08/06/2022 in [facility] [place] - A Dept Of Eligha Bridegroom. [facility] Office Visit from 01/31/2022 in [facility] [place] - A Dept Of [place] Brideg[place]. [facility] Telemedicine from 12/07/2020 in [facility] - A Dept Of [place] Bridegroom. [facility] Office Visit from 06/17/2019 in [facility] [place] - A Dept Of Eligha Bridegroom. [facility]  SDOH Interventions        Food Insecurity Interventions -- Intervention Not Indicated -- -- -- --  Housing Interventions -- Intervention Not Indicated -- -- -- --  Transportation Interventions Payor Benefit, Patient Resources (Friends/Family) Intervention Not Indicated -- -- -- --  Utilities Interventions -- Intervention Not Indicated -- -- -- --  Alcohol Usage Interventions -- Intervention Not Indicated (Score <7) -- -- -- --  Depression Interventions/Treatment  -- -- Currently on Treatment Currently on Treatment Medication Counseling  Financial Strain Interventions -- Intervention Not Indicated -- -- -- --  Physical Activity Interventions -- Intervention Not Indicated -- -- -- --  Stress Interventions -- Intervention Not Indicated -- -- -- --  Social Connections Interventions -- Intervention Not Indicated -- -- -- --  Health Literacy Interventions -- Intervention Not Indicated -- -- -- --       SDOH Screenings   Food Insecurity: No Food Insecurity (12/24/2023)   Housing: Low Risk  (12/24/2023)  Transportation Needs: No Transportation Needs (12/24/2023)  Utilities: Not At Risk (12/24/2023)  Alcohol Screen: Low Risk  (06/26/2023)  Depression (PHQ2-9): Low Risk  (12/24/2023)  Financial Resource Strain: High Risk (06/26/2023)  Physical Activity: Insufficiently Active (06/26/2023)  Social Connections: Moderately Isolated (06/26/2023)  Stress: No Stress Concern Present (06/26/2023)  Tobacco Use: Medium Risk (12/24/2023)  Health Literacy: Inadequate Health Literacy (06/26/2023)     Distress Screen completed: No     No data to display            Family/Social Information:  Housing Arrangement: patient lives with her daughter Family members/support persons in your life? Family, professional counseling Transportation concerns: yes, daughters are providing rides and has Medicaid benefit. Would like gas help for daughters  Employment: Unemployed .  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 yes, she is overwhelmed but understands next steps Patient reported stressors: Transportation and Adjusting to my illness Patient enjoys  crocheting Current coping skills/ strengths: Motivation for treatment/growth , Special hobby/interest , Supportive family/friends , and Other: connection to professional supports    SUMMARY: Current SDOH Barriers:  Transportation  Clinical Social Work Clinical Goal(s):  Patient will utilize Chartered loss adjuster resources for financial assistance  Interventions: Discussed common feeling and  emotions when being diagnosed with cancer, and the  importance of support during treatment Informed patient of the support team roles and support services at [facility] Provided CSW contact information and encouraged patient to call with any questions or concerns Provided patient with information about cancer organizations for financial assistance. Will apply patient to [employer] once a relevant bill is received and will refer for [employer] grant once pt is starting radiation Provided information about transportation through [facility] and potential for gas reimbursement   Follow Up Plan: Patient will send a bill to include with [employer] application Patient verbalizes understanding of plan: Yes    Mikah Poss E Dazhane Villagomez, LCSW Clinical Social Worker [facility]  Patient is participating in a Managed Medicaid Plan:  Yes

## 2024-01-01 ENCOUNTER — Encounter: Payer: Self-pay | Admitting: Psychiatry

## 2024-01-02 ENCOUNTER — Inpatient Hospital Stay: Payer: MEDICAID | Admitting: Licensed Clinical Social Worker

## 2024-01-02 NOTE — Progress Notes (Signed)
 [facility] CSW Progress Note  Clinical Child psychotherapist contacted patient by phone to follow-up on bill for [employer] application. Spoke with pt and daughter and explained how to send the bill and discussed what bills qualify.  CSW received statement and submitted application today. Informed pt that she will be notified by e-mail if she is approved for assistance by [employer].     Lakecia Deschamps E Emric Kowalewski, LCSW Clinical Social Worker [facility]    Patient is participating in a Managed Medicaid Plan:  Yes

## 2024-01-02 NOTE — Progress Notes (Signed)
 Surgical Instructions    Your procedure is scheduled on Thursday, April 24th, 2025.  Report to [facility] Main Entrance "A" at 09:30 A.M., then check in with the Admitting office.  Call this number if you have problems the morning of surgery:  [phone]   If you have any questions prior to your surgery date call [phone]: Open Monday-Friday 8am-4pm If you experience any cold or flu symptoms such as cough, fever, chills, shortness of breath, etc. between now and your scheduled surgery, please notify us at the above number     Remember:  Do not eat after midnight the night before your surgery  You may drink clear liquids until 08:30 the morning of your surgery.   Clear liquids allowed are: Water, Non-Citrus Juices (without pulp), Carbonated Beverages, Clear Tea, Black Coffee ONLY (NO MILK, CREAM OR POWDERED CREAMER of any kind), and Gatorade    Take these medicines the morning of surgery with A SIP OF WATER:   brimonidine-timolol (COMBIGAN)  busPIRone (BUSPAR)  DULoxetine (CYMBALTA)  FLUoxetine (PROZAC ) fluticasone-salmeterol (ADVAIR DISKUS)  gabapentin (NEURONTIN)  levocetirizine (XYZAL)  oxyCODONE-acetaminophen (PERCOCET)   If needed:  albuterol (PROAIR HFA ) cyclobenzaprine (FLEXERIL)   Please bring all inhalers with you the day of surgery.    As of today, STOP taking any Aspirin (unless otherwise instructed by your surgeon) Aleve, Naproxen, Ibuprofen, Motrin, Advil, Goody's, BC's, all herbal medications, fish oil, and all vitamins.   The day of surgery:          Do not wear jewelry or makeup. Do not wear lotions, powders, perfumes or deodorant. Do not shave 48 hours prior to surgery.   Do not bring valuables to the hospital. Do not wear nail polish, gel polish, artificial nails, or any other type of covering on natural nails (fingers and toes) If you have artificial nails or gel coating that need to be removed by a nail salon, please have this removed prior to  surgery. Artificial nails or gel coating may interfere with anesthesia's ability to adequately monitor your vital signs.  [facility] is not responsible for any belongings or valuables.    Do NOT Smoke (Tobacco/Vaping)  24 hours prior to your procedure  If you use a CPAP at night, you may bring your mask for your overnight stay.   Contacts, glasses, hearing aids, dentures or partials may not be worn into surgery, please bring cases for these belongings   For patients admitted to the hospital, discharge time will be determined by your treatment team.   Patients discharged the day of surgery will not be allowed to drive home, and someone needs to stay with them for 24 hours.   SURGICAL WAITING ROOM VISITATION Patients having surgery or a procedure may have no more than 2 support people in the waiting area - these visitors may rotate.   Children under the age of 81 must have an adult with them who is not the patient. If the patient needs to stay at the hospital during part of their recovery, the visitor guidelines for inpatient rooms apply. Pre-op nurse will coordinate an appropriate time for 1 support person to accompany patient in pre-op.  This support person may not rotate.   Please refer to [email or web address] for the visitor guidelines for Inpatients (after your surgery is over and you are in a regular room).    Special instructions:    Oral Hygiene is also important to reduce your risk of infection.  Remember - BRUSH YOUR TEETH  THE MORNING OF SURGERY WITH YOUR REGULAR TOOTHPASTE   [facility]- Preparing For Surgery  Before surgery, you can play an important role. Because skin is not sterile, your skin needs to be as free of germs as possible. You can reduce the number of germs on your skin by washing with CHG (chlorahexidine gluconate) Soap before surgery.  CHG is an antiseptic cleaner which kills germs and bonds with the skin to  continue killing germs even after washing.     Please do not use if you have an allergy to CHG or antibacterial soaps. If your skin becomes reddened/irritated stop using the CHG.  Do not shave (including legs and underarms) for at least 48 hours prior to first CHG shower. It is OK to shave your face.  Please follow these instructions carefully.     Shower the NIGHT BEFORE SURGERY and the MORNING OF SURGERY with CHG Soap.   If you chose to wash your hair, wash your hair first as usual with your normal shampoo. After you shampoo, rinse your hair and body thoroughly to remove the shampoo.  Then [employer] and genitals (private parts) with your normal soap and rinse thoroughly to remove soap.  After that Use CHG Soap as you would any other liquid soap. You can apply CHG directly to the skin and wash gently with a scrungie or a clean washcloth.   Apply the CHG Soap to your body ONLY FROM THE NECK DOWN.  Do not use on open wounds or open sores. Avoid contact with your eyes, ears, mouth and genitals (private parts). Wash Face and genitals (private parts)  with your normal soap.   Wash thoroughly, paying special attention to the area where your surgery will be performed.  Thoroughly rinse your body with warm water from the neck down.  DO NOT shower/wash with your normal soap after using and rinsing off the CHG Soap.  Pat yourself dry with a CLEAN TOWEL.  Wear CLEAN PAJAMAS to bed the night before surgery  Place CLEAN SHEETS on your bed the night before your surgery  DO NOT SLEEP WITH PETS.   Day of Surgery:  Take a shower with CHG soap. Wear Clean/Comfortable clothing the morning of surgery Do not apply any deodorants/lotions.   Remember to brush your teeth WITH YOUR REGULAR TOOTHPASTE.    If you received a COVID test during your pre-op visit, it is requested that you wear a mask when out in public, stay away from anyone that may not be feeling well, and notify your surgeon if you  develop symptoms. If you have been in contact with anyone that has tested positive in the last 10 days, please notify your surgeon.    Please read over the following fact sheets that you were given.

## 2024-01-06 ENCOUNTER — Inpatient Hospital Stay: Payer: MEDICAID

## 2024-01-06 ENCOUNTER — Encounter (HOSPITAL_COMMUNITY): Payer: Self-pay

## 2024-01-06 ENCOUNTER — Encounter (HOSPITAL_COMMUNITY)
Admission: RE | Admit: 2024-01-06 | Discharge: 2024-01-06 | Disposition: A | Discharge status: Home / Self Care | Payer: MEDICAID | Source: Ambulatory Visit | Attending: General Surgery | Admitting: General Surgery

## 2024-01-06 ENCOUNTER — Inpatient Hospital Stay: Payer: MEDICAID | Admitting: Psychiatry

## 2024-01-06 ENCOUNTER — Other Ambulatory Visit: Payer: Self-pay

## 2024-01-06 ENCOUNTER — Encounter: Payer: Self-pay | Admitting: Psychiatry

## 2024-01-06 VITALS — BP 107/73 | HR 99 | Temp 97.9°F | Resp 18 | Ht 61.0 in | Wt 210.0 lb

## 2024-01-06 VITALS — BP 110/77 | HR 85 | Temp 98.6°F | Resp 17 | Ht 61.0 in | Wt 209.0 lb

## 2024-01-06 DIAGNOSIS — Z801 Family history of malignant neoplasm of trachea, bronchus and lung: Secondary | ICD-10-CM | POA: Diagnosis not present

## 2024-01-06 DIAGNOSIS — J449 Chronic obstructive pulmonary disease, unspecified: Secondary | ICD-10-CM | POA: Diagnosis not present

## 2024-01-06 DIAGNOSIS — D3912 Neoplasm of uncertain behavior of left ovary: Secondary | ICD-10-CM | POA: Diagnosis not present

## 2024-01-06 DIAGNOSIS — Z8249 Family history of ischemic heart disease and other diseases of the circulatory system: Secondary | ICD-10-CM | POA: Diagnosis not present

## 2024-01-06 DIAGNOSIS — Z808 Family history of malignant neoplasm of other organs or systems: Secondary | ICD-10-CM | POA: Diagnosis not present

## 2024-01-06 DIAGNOSIS — Z6839 Body mass index (BMI) 39.0-39.9, adult: Secondary | ICD-10-CM | POA: Diagnosis not present

## 2024-01-06 DIAGNOSIS — Z79891 Long term (current) use of opiate analgesic: Secondary | ICD-10-CM | POA: Diagnosis not present

## 2024-01-06 DIAGNOSIS — Z8742 Personal history of other diseases of the female genital tract: Secondary | ICD-10-CM

## 2024-01-06 DIAGNOSIS — E669 Obesity, unspecified: Secondary | ICD-10-CM | POA: Diagnosis not present

## 2024-01-06 DIAGNOSIS — Z803 Family history of malignant neoplasm of breast: Secondary | ICD-10-CM | POA: Diagnosis not present

## 2024-01-06 DIAGNOSIS — Z01818 Encounter for other preprocedural examination: Secondary | ICD-10-CM | POA: Insufficient documentation

## 2024-01-06 DIAGNOSIS — N9489 Other specified conditions associated with female genital organs and menstrual cycle: Secondary | ICD-10-CM

## 2024-01-06 DIAGNOSIS — Z888 Allergy status to other drugs, medicaments and biological substances status: Secondary | ICD-10-CM | POA: Diagnosis not present

## 2024-01-06 DIAGNOSIS — Z83719 Family history of colon polyps, unspecified: Secondary | ICD-10-CM | POA: Diagnosis not present

## 2024-01-06 DIAGNOSIS — D573 Sickle-cell trait: Secondary | ICD-10-CM | POA: Diagnosis not present

## 2024-01-06 DIAGNOSIS — Z800 Family history of malignant neoplasm of digestive organs: Secondary | ICD-10-CM | POA: Diagnosis not present

## 2024-01-06 DIAGNOSIS — Z79899 Other long term (current) drug therapy: Secondary | ICD-10-CM | POA: Diagnosis not present

## 2024-01-06 DIAGNOSIS — I10 Essential (primary) hypertension: Secondary | ICD-10-CM | POA: Insufficient documentation

## 2024-01-06 DIAGNOSIS — Z87891 Personal history of nicotine dependence: Secondary | ICD-10-CM | POA: Diagnosis not present

## 2024-01-06 DIAGNOSIS — G8929 Other chronic pain: Secondary | ICD-10-CM | POA: Diagnosis not present

## 2024-01-06 DIAGNOSIS — F419 Anxiety disorder, unspecified: Secondary | ICD-10-CM | POA: Diagnosis not present

## 2024-01-06 DIAGNOSIS — M1990 Unspecified osteoarthritis, unspecified site: Secondary | ICD-10-CM | POA: Diagnosis not present

## 2024-01-06 DIAGNOSIS — Z880 Allergy status to penicillin: Secondary | ICD-10-CM | POA: Diagnosis not present

## 2024-01-06 DIAGNOSIS — Z8041 Family history of malignant neoplasm of ovary: Secondary | ICD-10-CM | POA: Diagnosis not present

## 2024-01-06 DIAGNOSIS — N959 Unspecified menopausal and perimenopausal disorder: Secondary | ICD-10-CM | POA: Diagnosis not present

## 2024-01-06 DIAGNOSIS — D0512 Intraductal carcinoma in situ of left breast: Secondary | ICD-10-CM | POA: Diagnosis present

## 2024-01-06 DIAGNOSIS — Z5986 Financial insecurity: Secondary | ICD-10-CM | POA: Diagnosis not present

## 2024-01-06 HISTORY — DX: Essential (primary) hypertension: I10

## 2024-01-06 HISTORY — DX: Bipolar disorder, unspecified: F31.9

## 2024-01-06 HISTORY — DX: Malignant neoplasm of unspecified ovary: C56.9

## 2024-01-06 LAB — CBC
HCT: 36.8 % (ref 36.0–46.0)
Hemoglobin: 12.1 g/dL (ref 12.0–15.0)
MCH: 26 pg (ref 26.0–34.0)
MCHC: 32.9 g/dL (ref 30.0–36.0)
MCV: 79 fL — ABNORMAL LOW (ref 80.0–100.0)
Platelets: 385 10*3/uL (ref 150–400)
RBC: 4.66 MIL/uL (ref 3.87–5.11)
RDW: 13.1 % (ref 11.5–15.5)
WBC: 9.6 10*3/uL (ref 4.0–10.5)
nRBC: 0 % (ref 0.0–0.2)

## 2024-01-06 LAB — BASIC METABOLIC PANEL WITH GFR
Anion gap: 10 (ref 5–15)
BUN: 13 mg/dL (ref 6–20)
CO2: 32 mmol/L (ref 22–32)
Calcium: 9 mg/dL (ref 8.9–10.3)
Chloride: 96 mmol/L — ABNORMAL LOW (ref 98–111)
Creatinine, Ser: 1.2 mg/dL — ABNORMAL HIGH (ref 0.44–1.00)
GFR, Estimated: 54 mL/min — ABNORMAL LOW (ref >60)
Glucose, Bld: 131 mg/dL — ABNORMAL HIGH (ref 70–99)
Potassium: 3.1 mmol/L — ABNORMAL LOW (ref 3.5–5.1)
Sodium: 138 mmol/L (ref 135–145)

## 2024-01-06 LAB — CEA (ACCESS): CEA (CHCC): 2.18 ng/mL (ref 0.00–5.00)

## 2024-01-06 NOTE — H&P (View-Only) (Signed)
 GYNECOLOGIC ONCOLOGY NEW PATIENT CONSULTATION  Date of Service: 01/06/2024 Referring Provider: Verlyn Goad, MD   ASSESSMENT AND PLAN: [name] is a 53 y.o. woman with a 11.3cm complex left adnexal, normal ca125.  We reviewed that the exact etiology of the pelvic mass is unclear, but could include a benign, borderline, or malignant process.  The recommended treatment is surgical excision to make a definitive diagnosis.  Given her pain and tenderness on exam, question if this may be a torsion.  The mass does feel slightly mobile on exam.  We did review the pros and cons of a minimally invasive approach versus open approach.  Although there may be increased risk of rupture with minimal use approach, given her chronic pain, obesity, and limited mobility due to her back pain, think patient would benefit from a minimally invasive approach.  Did review that if on laparoscopy we felt that rupture was high risk with MIS, would consider laparotomy if needed.  MIS approach would be with robotic assistance.  In the event of malignancy or borderline tumor on frozen section, we will perform indicated staging procedures. We discussed that these procedures may include hysterectomy, omentectomy pelvic and/or para-aortic lymphadenectomy, peritoneal biopsies. We would also remove any tissue concerning for metastatic disease which could require additional procedures including bowel surgery.  Patient was consented for: Diagnostic laparoscopy, robotic assisted bilateral salpingo-oophorectomy, possible hysterectomy, possible staging, poss laparotomy on 02/04/24.  The risks of surgery were discussed in detail and she understands these to including but not limited to bleeding requiring a blood transfusion, infection, injury to adjacent organs (including but not limited to the bowels, bladder, ureters, nerves, blood vessels), thromboembolic events, wound separation, hernia, vaginal cuff separation, possible risk of  lymphedema and lymphocyst if lymphadenectomy performed, unforseen complication, possible need for re-exploration, and medical complications such as heart attack, stroke, pneumonia.  If the patient experiences any of these events, she understands that her hospitalization or recovery may be prolonged and that she may need to take additional medications for a prolonged period. The patient will receive DVT and antibiotic prophylaxis as indicated. She voiced a clear understanding. She had the opportunity to ask questions and informed consent was obtained today. She wishes to proceed.  Recommend additional tumor markers today with CEA and CA 19-9. Given some of the concerning features of the mass on MRI recommend staging imaging with CT chest/abdomen/pelvis.  Reviewed with patient that if imaging was concerning for metastatic disease, would consider biopsy for diagnosis and neoadjuvant chemotherapy first pending diagnosis.  Will reach out to patient's breast oncology team to confirm if timing appropriate in the context of the rest of her breast cancer treatment.  She does not require preoperative clearance. Her METs are >4.  All preoperative instructions were reviewed. Postoperative expectations were also reviewed. Written handouts were provided to the patient.  RTC postop  A copy of this note was sent to the patient's referring provider.  Derrel Flies, MD Gynecologic Oncology   Medical Decision Making I personally spent  TOTAL 70 minutes face-to-face and non-face-to-face in the care of this patient, which includes all pre, intra, and post visit time on the date of service.   ------------  CC: Ovarian mass  HISTORY OF PRESENT ILLNESS:  [name] is a 53 y.o. woman who is seen in consultation at the request of Verlyn Goad, MD for evaluation of ovarian mass.  Patient presented to OB/GYN in 10/31/2023.  She reported menopause at age 27.  She noted a  history of ovarian cyst that was  discovered several years ago while she was still having periods.  The patient reported that this was deemed to be benign.  However, she desired follow-up as she felt that the diagnosis was unclear.  Exam at that time was limited by body habitus.  She underwent a pelvic ultrasound on 11/08/2023 which noted a complex cystic lesion in the left ovary measuring 6.7 x 6.6 x 6.6 cm with heterogeneous internal echogenic and vascular flow.  A subsequent MRI pelvis on 12/06/2023 noted a large mixed solid and cystic mass in the anterior pelvis felt to be arising from the left ovary measuring 11.3 x 9.2 x 8.9 cm concerning for ovarian malignancy.  There is also moderate volume free fluid in the pelvis and a nonenhancing rounded peritoneal body in the pelvis adjacent to the rectum measuring 1.3 cm of uncertain nature, possibly an epiploic appendage.  No evidence of lymphadenopathy or metastatic disease.  A CA125 was collected and normal 11.7.  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 Has surgery scheduled for 01/09/24 with Dr. Cherlynn Cornfield and then plan for adjuvant radiation. Has already med with Dr. Jeryl Moris with Radiation oncology and has an appointment with Dr. Arno Bibles with Oncology on 01/10/24.   Today patient presents with her daughter.  She notes that she has experienced a few episodes of pelvic pain in the past month which she previously described as pressure.  More recently the pain has become more stabbing in nature lasting about 15 minutes and then goes away.  The pain is not daily but occurs every "once in a while."  On review of imaging available in our system, it appears that she had an ultrasound in 2012 which showed normal ovaries at that time.  Patient reports that she follows with [facility] medical with Dr. Yvonnie Heritage for her chronic back pain.  She takes Percocet 10 mg 4 times a day.  She also follows with Dr. Newlin at [facility].   She is prescribed trazodone  1 tablet at night for sleep.  She follows with Dr. Grissom at [facility] for her glaucoma.   PAST MEDICAL HISTORY: Past Medical History:  Diagnosis Date   Allergy    pollen, pcn   Anxiety    Asthma    Breast cancer (HCC)    Cataract    COPD (chronic obstructive pulmonary disease) (HCC)    Depression    Fibroids    Glaucoma    History of ovarian cyst    Menometrorrhagia    Sickle cell trait (HCC)     PAST SURGICAL HISTORY: Past Surgical History:  Procedure Laterality Date   BREAST BIOPSY Left 12/05/2023   MM LT BREAST BX W LOC DEV 1ST LESION IMAGE BX SPEC STEREO GUIDE 12/05/2023 [facility]   CATARACT EXTRACTION, BILATERAL     CESAREAN SECTION     all three pregnancies   COLONOSCOPY  07/04/2021    OB/GYN HISTORY: OB History  Gravida Para Term Preterm AB Living  3 3 0   3  SAB IAB Ectopic Multiple Live Births          # Outcome Date GA Lbr Len/2nd Weight Sex Type Anes PTL Lv  3 Para 1999 [redacted]w[redacted]d    CS-LTranv        Birth Comments: HTN  2 Para 1993 [redacted]w[redacted]d    CS-LTranv        Birth Comments: HTN  1  Para 1991 [redacted]w[redacted]d    CS-LTranv        Birth Comments: HTN      Age at menarche: 68 Age at menopause: 24 Hx of HRT: no Hx of STI: no Last pap: 10/31/21 NILM, HPVHR neg History of abnormal pap smears: no  SCREENING STUDIES:  Last mammogram: 12/2023 Last colonoscopy: 2025 in [place]  MEDICATIONS:  Current Outpatient Medications:    albuterol  (PROAIR  HFA) 108 (90 Base) MCG/ACT inhaler, Inhale 2 puffs into the lungs every 6 (six) hours as needed for wheezing or shortness of breath. (Patient taking differently: Inhale 2 puffs into the lungs in the morning and at bedtime.), Disp: 18 g, Rfl: 2   brimonidine-timolol (COMBIGAN) 0.2-0.5 % ophthalmic solution, Place 1 drop into both eyes every 12 (twelve) hours., Disp: , Rfl:    busPIRone  (BUSPAR ) 15 MG tablet, Take 1 tablet by mouth 2 times per day (Patient taking differently:  Take 15 mg by mouth in the morning.), Disp: 60 tablet, Rfl: 2   cariprazine  (VRAYLAR ) 1.5 MG capsule, Take 2 capsules by mouth daily. (Patient taking differently: Take 1.5 mg by mouth every evening.), Disp: 60 capsule, Rfl: 2   cholecalciferol (VITAMIN D3) 25 MCG (1000 UNIT) tablet, Take 1,000 Units by mouth in the morning., Disp: , Rfl:    cloNIDine  (CATAPRES ) 0.2 MG tablet, Take 1 tablet (0.2 mg total) by mouth at bedtime. (Patient taking differently: Take 0.2 mg by mouth daily in the afternoon.), Disp: 90 tablet, Rfl: 1   clotrimazole  (LOTRIMIN ) 1 % cream, Apply 1 application topically 2 (two) times daily. (Patient taking differently: Apply 1 application  topically 2 (two) times daily as needed (skin irritation).), Disp: 45 g, Rfl: 1   cyclobenzaprine  (FLEXERIL ) 10 MG tablet, Take 1 tablet (10 mg total) by mouth 2 (two) times daily as needed for muscle spasms (Patient taking differently: Take 10 mg by mouth in the morning.), Disp: 90 tablet, Rfl: 1   DULoxetine  (CYMBALTA ) 60 MG capsule, TAKE 1 CAPSULE (60 MG TOTAL) BY MOUTH DAILY., Disp: 30 capsule, Rfl: 6   FLUoxetine  (PROZAC ) 40 MG capsule, Take 40 mg by mouth in the morning., Disp: , Rfl:    fluticasone -salmeterol (ADVAIR  DISKUS) 500-50 MCG/ACT AEPB, Inhale 1 puff into the lungs 2 (two) times daily., Disp: 180 each, Rfl: 1   gabapentin  (NEURONTIN ) 300 MG capsule, Take 2 capsules (600 mg total) by mouth 2 (two) times daily. (Patient taking differently: Take 300 mg by mouth in the morning.), Disp: 120 capsule, Rfl: 6   lactulose , encephalopathy, (CHRONULAC ) 10 GM/15ML SOLN, Take 15 mLs (10 g total) by mouth 2 (two) times daily as needed for mild constipation. (Patient taking differently: Take 6.6667 g by mouth in the morning.), Disp: 946 mL, Rfl: 1   levocetirizine (XYZAL ) 5 MG tablet, Take 1 tablet (5 mg total) by mouth every evening. (Patient taking differently: Take 5 mg by mouth in the morning.), Disp: 30 tablet, Rfl: 2   lidocaine  (LIDODERM ) 5  %, Place 1 patch onto the skin daily. Remove & Discard patch within 12 hours or as directed by MD (Patient taking differently: Place 1 patch onto the skin daily as needed (pain.).), Disp: 30 patch, Rfl: 3   LINZESS 145 MCG CAPS capsule, Take 1 capsule by mouth daily., Disp: , Rfl:    meloxicam  (MOBIC ) 7.5 MG tablet, Take 1 tablet (7.5 mg total) by mouth daily as needed. (Patient taking differently: Take 7.5 mg by mouth in the morning.), Disp: 90 tablet, Rfl: 1  Misc. Devices MISC, 4-pronged cane.  Diagnosis-spinal stenosis, Disp: 1 each, Rfl: 0   Misc. Devices MISC, Rollator walker with seat.  Diagnosis spinal stenosis, Disp: 1 each, Rfl: 0   naloxone (NARCAN) nasal spray 4 mg/0.1 mL, 1 (ONE) SPRAY BY NOSE AS NEEDED, IF FOUND UNRESPONSIVE THEN SPRAY INTO NOSE AND CALL 911, Disp: , Rfl:    oxyCODONE -acetaminophen  (PERCOCET) 10-325 MG tablet, Take 1 tablet by mouth in the morning, at noon, in the evening, and at bedtime. Takes 4 times daily, Disp: , Rfl:    prazosin  (MINIPRESS ) 1 MG capsule, Take 1 capsule by mouth at bedtime for nightmares, Disp: 30 capsule, Rfl: 2   traZODone  (DESYREL ) 50 MG tablet, TAKE 1 TABLET (50 MG TOTAL) BY MOUTH AT BEDTIME AS NEEDED. FOR SLEEP (Patient taking differently: Take 50 mg by mouth at bedtime.), Disp: 30 tablet, Rfl: 6   triamcinolone  cream (KENALOG ) 0.1 %, Apply 1 application topically daily to arm for eczema, Disp: 15 g, Rfl: 0   triamterene-hydrochlorothiazide (MAXZIDE-25) 37.5-25 MG tablet, Take 1 tablet by mouth in the morning., Disp: , Rfl:   ALLERGIES: Allergies  Allergen Reactions   Bupropion Hcl Er (Sr) Hives and Other (See Comments)    The patient has an entire page of reactions that can be caused by the medication, but none that she has suffered.   Fruit & Vegetable Daily [Nutritional Supplements] Other (See Comments)    "all fruits causes bumps and weird feeling in her mouth"   Penicillins Hives   Pollen Extract-Tree Extract Other (See Comments)     Allergies   Other Rash    "bugs" roaches    FAMILY HISTORY: Family History  Problem Relation Age of Onset   Breast cancer Mother    Colon cancer Mother    Colon polyps Mother    Hypertension Mother    Heart disease Father    Throat cancer Father    Breast cancer Maternal Grandmother    Lung cancer Maternal Grandfather    Breast cancer Paternal Grandmother    Other Neg Hx    Esophageal cancer Neg Hx    Rectal cancer Neg Hx    Stomach cancer Neg Hx    Prostate cancer Neg Hx    Pancreatic cancer Neg Hx    Ovarian cancer Neg Hx    Endometrial cancer Neg Hx     SOCIAL HISTORY: Social History   Socioeconomic History   Marital status: Divorced    Spouse name: Not on file   Number of children: Not on file   Years of education: Not on file   Highest education level: Not on file  Occupational History   Not on file  Tobacco Use   Smoking status: Former    Current packs/day: 0.00    Types: Cigarettes    Quit date: 02/14/2016    Years since quitting: 7.8   Smokeless tobacco: Never   Tobacco comments:    3 cigarettes daily 06-22-21  Vaping Use   Vaping status: Former  Substance and Sexual Activity   Alcohol  use: No    Comment: occ   Drug use: No   Sexual activity: Yes    Partners: Male    Birth control/protection: None  Other Topics Concern   Not on file  Social History Narrative   Not on file   Social Drivers of Health   Financial Resource Strain: High Risk (06/26/2023)   Overall Financial Resource Strain (CARDIA)    Difficulty of Paying Living Expenses: Very  hard  Food Insecurity: No Food Insecurity (12/24/2023)   Hunger Vital Sign    Worried About Running Out of Food in the Last Year: Never true    Ran Out of Food in the Last Year: Never true  Transportation Needs: No Transportation Needs (12/24/2023)   PRAPARE - Administrator, Civil Service (Medical): No    Lack of Transportation (Non-Medical): No  Physical Activity: Insufficiently Active  (06/26/2023)   Exercise Vital Sign    Days of Exercise per Week: 7 days    Minutes of Exercise per Session: 10 min  Stress: No Stress Concern Present (06/26/2023)   [employer] of Occupational Health - Occupational Stress Questionnaire    Feeling of Stress : Only a little  Social Connections: Moderately Isolated (06/26/2023)   Social Connection and Isolation Panel [NHANES]    Frequency of Communication with Friends and Family: Once a week    Frequency of Social Gatherings with Friends and Family: Once a week    Attends Religious Services: 1 to 4 times per year    Active Member of [employer] or Organizations: Yes    Attends Banker Meetings: 1 to 4 times per year    Marital Status: Divorced  Intimate Partner Violence: Not At Risk (12/24/2023)   Humiliation, Afraid, Rape, and Kick questionnaire    Fear of Current or Ex-Partner: No    Emotionally Abused: No    Physically Abused: No    Sexually Abused: No    REVIEW OF SYSTEMS: New patient intake form was reviewed.  Complete 10-system review is negative except for the following: Shortness of breath, joint pain, back pain, anxiety, vision problems, abdominal pain, pain with intercourse, muscle cramp, depression, hot flashes, problems with walking, confusion  PHYSICAL EXAM: BP 110/77 (BP Location: Left Arm, Patient Position: Sitting)   Pulse 85   Temp 98.6 F (37 C)   Resp 17   Ht 5\' 1"  (1.549 m)   Wt 209 lb (94.8 kg)   LMP 03/28/2016   SpO2 98%   BMI 39.49 kg/m  Constitutional: No acute distress. Neuro/Psych: Alert, oriented.  Head and Neck: Normocephalic, atraumatic. Neck symmetric without masses. Sclera anicteric.  Respiratory: Normal work of breathing. Clear to auscultation bilaterally. Cardiovascular: Regular rate and rhythm, no murmurs, rubs, or gallops. Abdomen: Normoactive bowel sounds. Soft, non-distended, non-tender to palpation. Mass palpated in the lower abdomen. Extremities: Grossly normal range of motion.  Warm, well perfused. No edema bilaterally. Uses cane for ambulation Skin: No rashes or lesions. Lymphatic: No cervical, supraclavicular, or inguinal adenopathy. Genitourinary: External genitalia without lesions. Urethral meatus without lesions or prolapse. On speculum exam, vagina and cervix without lesions. Bimanual exam reveals mass in anterior cul-de-sac, somewhat mobile, with mild TTP on abdominal hand. Rectovaginal exam confirms the above findings and reveals normal sphincter tone and no masses or nodularity. Exam chaperoned by Vira Grieves, NP   LABORATORY AND RADIOLOGIC DATA: Outside medical records were reviewed to synthesize the above history, along with the history and physical obtained during the visit.  Outside laboratory, pathology, and imaging reports were reviewed, with pertinent results below.  I personally reviewed the outside images.  WBC  Date Value Ref Range Status  04/25/2022 7.6 4.0 - 10.5 K/uL Final   Hemoglobin  Date Value Ref Range Status  04/25/2022 11.3 (L) 12.0 - 15.0 g/dL Final  16/06/9603 54.0 11.1 - 15.9 g/dL Final   HCT  Date Value Ref Range Status  04/25/2022 34.5 (L) 36.0 - 46.0 %  Final   Hematocrit  Date Value Ref Range Status  01/31/2022 35.5 34.0 - 46.6 % Final   Platelets  Date Value Ref Range Status  04/25/2022 334 150 - 400 K/uL Final  01/31/2022 344 150 - 450 x10E3/uL Final   Magnesium   Date Value Ref Range Status  02/25/2013 2.0 1.5 - 2.5 mg/dL Final   Creat  Date Value Ref Range Status  10/09/2016 0.67 0.50 - 1.10 mg/dL Final   Creatinine, Ser  Date Value Ref Range Status  07/24/2023 0.75 0.57 - 1.00 mg/dL Final   AST  Date Value Ref Range Status  07/24/2023 14 0 - 40 IU/L Final   ALT  Date Value Ref Range Status  07/24/2023 7 0 - 32 IU/L Final   Cancer Antigen (CA) 125  Date Value Ref Range Status  11/21/2023 11.7 0.0 - 38.1 U/mL Final    Comment:    Roche Diagnostics Electrochemiluminescence Immunoassay  (ECLIA) Values obtained with different assay methods or kits cannot be used interchangeably.  Results cannot be interpreted as absolute evidence of the presence or absence of malignant disease.    Diagnosis  Date Value Ref Range Status  10/31/2021   Final   - Negative for intraepithelial lesion or malignancy (NILM)  04/24/2018   Final   NEGATIVE FOR INTRAEPITHELIAL LESIONS OR MALIGNANCY.   HPV  Date Value Ref Range Status  04/24/2018 NOT DETECTED  Final    Comment:    Normal Reference Range - NOT Detected    MR PELVIS W WO CONTRAST 12/06/2023  Narrative CLINICAL DATA:  Ovarian mass, history of breast cancer  EXAM: MRI PELVIS WITHOUT AND WITH CONTRAST  TECHNIQUE: Multiplanar multisequence MR imaging of the pelvis was performed both before and after administration of intravenous contrast.  CONTRAST:  9mL GADAVIST  GADOBUTROL  1 MMOL/ML IV SOLN  COMPARISON:  Pelvic ultrasound, 11/08/2023  FINDINGS: Urinary Tract:  No abnormality visualized.  Bowel:  Unremarkable visualized pelvic bowel loops.  Vascular/Lymphatic: No pathologically enlarged lymph nodes. No significant vascular abnormality seen.  Reproductive: Large mixed solid and cystic mass situated in the anterior pelvis, and arising from the left ovary, measuring 11.3 x 9.2 x 8.9 cm (series 5, image 17, series 6, image 16). There is a dominant cystic component anteriorly and posteriorly a large, complex solid component with low-level internal contrast enhancement. Normal postmenopausal appearance of the right ovary. The uterus is retroflexed by mass effect but otherwise normal in postmenopausal appearance.  Other: Moderate volume free fluid in the pelvis. Nonenhancing rounded peritoneal body in the dependent low pelvis adjacent to the rectum measuring 1.3 cm (series 5, image 21).  Musculoskeletal: No suspicious bone lesions identified.  IMPRESSION: 1. Large mixed solid and cystic mass situated in the  anterior pelvis, and arising from the left ovary, measuring 11.3 x 9.2 x 8.9 cm. There is a dominant cystic component anteriorly and posteriorly a large, complex solid component with low-level internal contrast enhancement. 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 2. Moderate volume free fluid in the pelvis. Nonenhancing rounded peritoneal body in the dependent low pelvis adjacent to the rectum measuring 1.3 cm, of uncertain nature, possibly an epiploic appendage or dropped gallstone although concerning for a peritoneal metastasis in this setting. 3. No evidence of lymphadenopathy or other metastatic disease in the pelvis.  These results will be called to the ordering clinician or representative by the Radiologist Assistant, and communication documented in the PACS or [employer].   Electronically Signed By: Evonne Hoist.D.  On: 12/15/2023 07:39   US  PELVIC COMPLETE W TRANSVAGINAL AND TORSION R/O 11/08/2023  Narrative CLINICAL DATA:  Right ovarian cyst  EXAM: TRANSABDOMINAL AND TRANSVAGINAL ULTRASOUND OF PELVIS  DOPPLER ULTRASOUND OF OVARIES  TECHNIQUE: Both transabdominal and transvaginal ultrasound examinations of the pelvis were performed. Transabdominal technique was performed for global imaging of the pelvis including uterus, ovaries, adnexal regions, and pelvic cul-de-sac.  It was necessary to proceed with endovaginal exam following the transabdominal exam to visualize the uterus and adnexa. Color and duplex Doppler ultrasound was utilized to evaluate blood flow to the ovaries.  COMPARISON:  Ultrasound 12/20/2010  FINDINGS: Uterus  Measurements: 6.4 x 3.8 x 4.4 cm = volume: 56 mL. No fibroids or other mass visualized.  Endometrium  Thickness: 6 mm. Trace fluid in the endometrial canal. No focal abnormality visualized.  Right ovary  Not visualized.  Left ovary  Measurements: 11.3 x 8.2 x 10.1 cm = volume: 480 mL. Complex  cystic lesion in the left ovary measuring 6.7 x 6.6 x 6.6 cm. This contains heterogenous internal echogenicity and vascular flow.  Pulsed Doppler evaluation of both ovaries demonstrates normal low-resistance arterial and venous waveforms.  Other findings  Small volume free fluid in the pelvis.  IMPRESSION: 1. Complex cystic lesion in the left ovary measuring 6.7 cm. This contains heterogenous internal echogenicity and possible low-level vascular flow. Consider GYN consult and followup US  in 3-6 months, or pelvis MRI w/o and w/ contrast for improved characterization. Note: This recommendation does not apply to premenarchal patients or to those with increased risk (genetic, family history, elevated tumor markers or other high-risk factors) of ovarian cancer. Reference: Radiology 2019 Nov; 293(2):359-371. 2. Right ovary not visualized.   Electronically Signed By: Rozell Cornet M.D. On: 11/20/2023 03:17

## 2024-01-06 NOTE — Progress Notes (Signed)
 PCP - Dr. Joaquin Mulberry Cardiologist - denies  PPM/ICD - denies   Chest x-ray - 04/25/22 EKG - 01/06/24 Stress Test - denies ECHO - denies Cardiac Cath - denies  Sleep Study - denies   DM- denies  Last dose of GLP1 agonist-  n/a   ASA/Blood Thinner Instructions: n/a   ERAS Protcol - clears until 0830   COVID TEST- n/a   Anesthesia review: yes, seed placement  Patient denies shortness of breath, fever, cough and chest pain at PAT appointment   All instructions explained to the patient, with a verbal understanding of the material. Patient agrees to go over the instructions while at home for a better understanding.  The opportunity to ask questions was provided.

## 2024-01-06 NOTE — Patient Instructions (Signed)
 Plan on having a CT scan and lab work today for additional tumor markers.   -For the CT scan, nothing to eat and drink 4 hours before. You will need to arrive 2 hours before your scan to start drinking oral contrast.        We will tentatively plan for surgery at [facility] with Dr. Derrel Flies on Feb 04, 2024. She is going to reach out to your breast team to see if this date will work with your other needs. We will see you back in the office closer to the date for a preop appointment with Jasman Murri NP to discuss the instructions for before and after surgery.  You may also receive a phone call from the hospital to arrange for a pre-op appointment there as well. Usually both appointments can be combined on the same day.

## 2024-01-06 NOTE — Progress Notes (Signed)
 GYNECOLOGIC ONCOLOGY NEW PATIENT CONSULTATION  Date of Service: 01/06/2024 Referring Provider: Verlyn Goad, MD   ASSESSMENT AND PLAN: LYZA HOUSEWORTH is a 53 y.o. woman with a 11.3cm complex left adnexal, normal ca125.  We reviewed that the exact etiology of the pelvic mass is unclear, but could include a benign, borderline, or malignant process.  The recommended treatment is surgical excision to make a definitive diagnosis.  Given her pain and tenderness on exam, question if this may be a torsion.  The mass does feel slightly mobile on exam.  We did review the pros and cons of a minimally invasive approach versus open approach.  Although there may be increased risk of rupture with minimal use approach, given her chronic pain, obesity, and limited mobility due to her back pain, think patient would benefit from a minimally invasive approach.  Did review that if on laparoscopy we felt that rupture was high risk with MIS, would consider laparotomy if needed.  MIS approach would be with robotic assistance.  In the event of malignancy or borderline tumor on frozen section, we will perform indicated staging procedures. We discussed that these procedures may include hysterectomy, omentectomy pelvic and/or para-aortic lymphadenectomy, peritoneal biopsies. We would also remove any tissue concerning for metastatic disease which could require additional procedures including bowel surgery.  Patient was consented for: Diagnostic laparoscopy, robotic assisted bilateral salpingo-oophorectomy, possible hysterectomy, possible staging, poss laparotomy on 02/04/24.  The risks of surgery were discussed in detail and she understands these to including but not limited to bleeding requiring a blood transfusion, infection, injury to adjacent organs (including but not limited to the bowels, bladder, ureters, nerves, blood vessels), thromboembolic events, wound separation, hernia, vaginal cuff separation, possible risk of  lymphedema and lymphocyst if lymphadenectomy performed, unforseen complication, possible need for re-exploration, and medical complications such as heart attack, stroke, pneumonia.  If the patient experiences any of these events, she understands that her hospitalization or recovery may be prolonged and that she may need to take additional medications for a prolonged period. The patient will receive DVT and antibiotic prophylaxis as indicated. She voiced a clear understanding. She had the opportunity to ask questions and informed consent was obtained today. She wishes to proceed.  Recommend additional tumor markers today with CEA and CA 19-9. Given some of the concerning features of the mass on MRI recommend staging imaging with CT chest/abdomen/pelvis.  Reviewed with patient that if imaging was concerning for metastatic disease, would consider biopsy for diagnosis and neoadjuvant chemotherapy first pending diagnosis.  Will reach out to patient's breast oncology team to confirm if timing appropriate in the context of the rest of her breast cancer treatment.  She does not require preoperative clearance. Her METs are >4.  All preoperative instructions were reviewed. Postoperative expectations were also reviewed. Written handouts were provided to the patient.  RTC postop  A copy of this note was sent to the patient's referring provider.  Derrel Flies, MD Gynecologic Oncology   Medical Decision Making I personally spent  TOTAL 70 minutes face-to-face and non-face-to-face in the care of this patient, which includes all pre, intra, and post visit time on the date of service.   ------------  CC: Ovarian mass  HISTORY OF PRESENT ILLNESS:  [name] is a 53 y.o. woman who is seen in consultation at the request of Verlyn Goad, MD for evaluation of ovarian mass.  Patient presented to OB/GYN in 10/31/2023.  She reported menopause at age 27.  She noted a  history of ovarian cyst that was  discovered several years ago while she was still having periods.  The patient reported that this was deemed to be benign.  However, she desired follow-up as she felt that the diagnosis was unclear.  Exam at that time was limited by body habitus.  She underwent a pelvic ultrasound on 11/08/2023 which noted a complex cystic lesion in the left ovary measuring 6.7 x 6.6 x 6.6 cm with heterogeneous internal echogenic and vascular flow.  A subsequent MRI pelvis on 12/06/2023 noted a large mixed solid and cystic mass in the anterior pelvis felt to be arising from the left ovary measuring 11.3 x 9.2 x 8.9 cm concerning for ovarian malignancy.  There is also moderate volume free fluid in the pelvis and a nonenhancing rounded peritoneal body in the pelvis adjacent to the rectum measuring 1.3 cm of uncertain nature, possibly an epiploic appendage.  No evidence of lymphadenopathy or metastatic disease.  A CA125 was collected and normal 11.7.  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 Has surgery scheduled for 01/09/24 with Dr. Cherlynn Cornfield and then plan for adjuvant radiation. Has already med with Dr. Jeryl Moris with Radiation oncology and has an appointment with Dr. Arno Bibles with Oncology on 01/10/24.   Today patient presents with her daughter.  She notes that she has experienced a few episodes of pelvic pain in the past month which she previously described as pressure.  More recently the pain has become more stabbing in nature lasting about 15 minutes and then goes away.  The pain is not daily but occurs every "once in a while."  On review of imaging available in our system, it appears that she had an ultrasound in 2012 which showed normal ovaries at that time.  Patient reports that she follows with [facility] with Dr. Yvonnie Heritage for her chronic back pain.  She takes Percocet 10 mg 4 times a day.  She also follows with Dr. Newlin at [facility].   She is prescribed trazodone  1 tablet at night for sleep.  She follows with Dr. Grissom at [facility] for her glaucoma.   PAST MEDICAL HISTORY: Past Medical History:  Diagnosis Date   Allergy    pollen, pcn   Anxiety    Asthma    Breast cancer (HCC)    Cataract    COPD (chronic obstructive pulmonary disease) (HCC)    Depression    Fibroids    Glaucoma    History of ovarian cyst    Menometrorrhagia    Sickle cell trait (HCC)     PAST SURGICAL HISTORY: Past Surgical History:  Procedure Laterality Date   BREAST BIOPSY Left 12/05/2023   MM LT BREAST BX W LOC DEV 1ST LESION IMAGE BX SPEC STEREO GUIDE 12/05/2023 [facility]   CATARACT EXTRACTION, BILATERAL     CESAREAN SECTION     all three pregnancies   COLONOSCOPY  07/04/2021    OB/GYN HISTORY: OB History  Gravida Para Term Preterm AB Living  3 3 0   3  SAB IAB Ectopic Multiple Live Births          # Outcome Date GA Lbr Len/2nd Weight Sex Type Anes PTL Lv  3 Para 1999 [redacted]w[redacted]d    CS-LTranv        Birth Comments: HTN  2 Para 1993 [redacted]w[redacted]d    CS-LTranv        Birth Comments: HTN  1  Para 1991 [redacted]w[redacted]d    CS-LTranv        Birth Comments: HTN      Age at menarche: 53 Age at menopause: 68 Hx of HRT: no Hx of STI: no Last pap: 10/31/21 NILM, HPVHR neg History of abnormal pap smears: no  SCREENING STUDIES:  Last mammogram: 12/2023 Last colonoscopy: 2025 in [place]  MEDICATIONS:  Current Outpatient Medications:    albuterol  (PROAIR  HFA) 108 (90 Base) MCG/ACT inhaler, Inhale 2 puffs into the lungs every 6 (six) hours as needed for wheezing or shortness of breath. (Patient taking differently: Inhale 2 puffs into the lungs in the morning and at bedtime.), Disp: 18 g, Rfl: 2   brimonidine-timolol (COMBIGAN) 0.2-0.5 % ophthalmic solution, Place 1 drop into both eyes every 12 (twelve) hours., Disp: , Rfl:    busPIRone  (BUSPAR ) 15 MG tablet, Take 1 tablet by mouth 2 times per day (Patient taking differently:  Take 15 mg by mouth in the morning.), Disp: 60 tablet, Rfl: 2   cariprazine  (VRAYLAR ) 1.5 MG capsule, Take 2 capsules by mouth daily. (Patient taking differently: Take 1.5 mg by mouth every evening.), Disp: 60 capsule, Rfl: 2   cholecalciferol (VITAMIN D3) 25 MCG (1000 UNIT) tablet, Take 1,000 Units by mouth in the morning., Disp: , Rfl:    cloNIDine  (CATAPRES ) 0.2 MG tablet, Take 1 tablet (0.2 mg total) by mouth at bedtime. (Patient taking differently: Take 0.2 mg by mouth daily in the afternoon.), Disp: 90 tablet, Rfl: 1   clotrimazole  (LOTRIMIN ) 1 % cream, Apply 1 application topically 2 (two) times daily. (Patient taking differently: Apply 1 application  topically 2 (two) times daily as needed (skin irritation).), Disp: 45 g, Rfl: 1   cyclobenzaprine  (FLEXERIL ) 10 MG tablet, Take 1 tablet (10 mg total) by mouth 2 (two) times daily as needed for muscle spasms (Patient taking differently: Take 10 mg by mouth in the morning.), Disp: 90 tablet, Rfl: 1   DULoxetine  (CYMBALTA ) 60 MG capsule, TAKE 1 CAPSULE (60 MG TOTAL) BY MOUTH DAILY., Disp: 30 capsule, Rfl: 6   FLUoxetine  (PROZAC ) 40 MG capsule, Take 40 mg by mouth in the morning., Disp: , Rfl:    fluticasone -salmeterol (ADVAIR  DISKUS) 500-50 MCG/ACT AEPB, Inhale 1 puff into the lungs 2 (two) times daily., Disp: 180 each, Rfl: 1   gabapentin  (NEURONTIN ) 300 MG capsule, Take 2 capsules (600 mg total) by mouth 2 (two) times daily. (Patient taking differently: Take 300 mg by mouth in the morning.), Disp: 120 capsule, Rfl: 6   lactulose , encephalopathy, (CHRONULAC ) 10 GM/15ML SOLN, Take 15 mLs (10 g total) by mouth 2 (two) times daily as needed for mild constipation. (Patient taking differently: Take 6.6667 g by mouth in the morning.), Disp: 946 mL, Rfl: 1   levocetirizine (XYZAL ) 5 MG tablet, Take 1 tablet (5 mg total) by mouth every evening. (Patient taking differently: Take 5 mg by mouth in the morning.), Disp: 30 tablet, Rfl: 2   lidocaine  (LIDODERM ) 5  %, Place 1 patch onto the skin daily. Remove & Discard patch within 12 hours or as directed by MD (Patient taking differently: Place 1 patch onto the skin daily as needed (pain.).), Disp: 30 patch, Rfl: 3   LINZESS 145 MCG CAPS capsule, Take 1 capsule by mouth daily., Disp: , Rfl:    meloxicam  (MOBIC ) 7.5 MG tablet, Take 1 tablet (7.5 mg total) by mouth daily as needed. (Patient taking differently: Take 7.5 mg by mouth in the morning.), Disp: 90 tablet, Rfl: 1  Misc. Devices MISC, 4-pronged cane.  Diagnosis-spinal stenosis, Disp: 1 each, Rfl: 0   Misc. Devices MISC, Rollator walker with seat.  Diagnosis spinal stenosis, Disp: 1 each, Rfl: 0   naloxone (NARCAN) nasal spray 4 mg/0.1 mL, 1 (ONE) SPRAY BY NOSE AS NEEDED, IF FOUND UNRESPONSIVE THEN SPRAY INTO NOSE AND CALL 911, Disp: , Rfl:    oxyCODONE -acetaminophen  (PERCOCET) 10-325 MG tablet, Take 1 tablet by mouth in the morning, at noon, in the evening, and at bedtime. Takes 4 times daily, Disp: , Rfl:    prazosin  (MINIPRESS ) 1 MG capsule, Take 1 capsule by mouth at bedtime for nightmares, Disp: 30 capsule, Rfl: 2   traZODone  (DESYREL ) 50 MG tablet, TAKE 1 TABLET (50 MG TOTAL) BY MOUTH AT BEDTIME AS NEEDED. FOR SLEEP (Patient taking differently: Take 50 mg by mouth at bedtime.), Disp: 30 tablet, Rfl: 6   triamcinolone  cream (KENALOG ) 0.1 %, Apply 1 application topically daily to arm for eczema, Disp: 15 g, Rfl: 0   triamterene-hydrochlorothiazide (MAXZIDE-25) 37.5-25 MG tablet, Take 1 tablet by mouth in the morning., Disp: , Rfl:   ALLERGIES: Allergies  Allergen Reactions   Bupropion Hcl Er (Sr) Hives and Other (See Comments)    The patient has an entire page of reactions that can be caused by the medication, but none that she has suffered.   Fruit & Vegetable Daily [Nutritional Supplements] Other (See Comments)    "all fruits causes bumps and weird feeling in her mouth"   Penicillins Hives   Pollen Extract-Tree Extract Other (See Comments)     Allergies   Other Rash    "bugs" roaches    FAMILY HISTORY: Family History  Problem Relation Age of Onset   Breast cancer Mother    Colon cancer Mother    Colon polyps Mother    Hypertension Mother    Heart disease Father    Throat cancer Father    Breast cancer Maternal Grandmother    Lung cancer Maternal Grandfather    Breast cancer Paternal Grandmother    Other Neg Hx    Esophageal cancer Neg Hx    Rectal cancer Neg Hx    Stomach cancer Neg Hx    Prostate cancer Neg Hx    Pancreatic cancer Neg Hx    Ovarian cancer Neg Hx    Endometrial cancer Neg Hx     SOCIAL HISTORY: Social History   Socioeconomic History   Marital status: Divorced    Spouse name: Not on file   Number of children: Not on file   Years of education: Not on file   Highest education level: Not on file  Occupational History   Not on file  Tobacco Use   Smoking status: Former    Current packs/day: 0.00    Types: Cigarettes    Quit date: 02/14/2016    Years since quitting: 7.8   Smokeless tobacco: Never   Tobacco comments:    3 cigarettes daily 06-22-21  Vaping Use   Vaping status: Former  Substance and Sexual Activity   Alcohol  use: No    Comment: occ   Drug use: No   Sexual activity: Yes    Partners: Male    Birth control/protection: None  Other Topics Concern   Not on file  Social History Narrative   Not on file   Social Drivers of Health   Financial Resource Strain: High Risk (06/26/2023)   Overall Financial Resource Strain (CARDIA)    Difficulty of Paying Living Expenses: Very  hard  Food Insecurity: No Food Insecurity (12/24/2023)   Hunger Vital Sign    Worried About Running Out of Food in the Last Year: Never true    Ran Out of Food in the Last Year: Never true  Transportation Needs: No Transportation Needs (12/24/2023)   PRAPARE - Administrator, Civil Service (Medical): No    Lack of Transportation (Non-Medical): No  Physical Activity: Insufficiently Active  (06/26/2023)   Exercise Vital Sign    Days of Exercise per Week: 7 days    Minutes of Exercise per Session: 10 min  Stress: No Stress Concern Present (06/26/2023)   [employer] of Occupational Health - Occupational Stress Questionnaire    Feeling of Stress : Only a little  Social Connections: Moderately Isolated (06/26/2023)   Social Connection and Isolation Panel [NHANES]    Frequency of Communication with Friends and Family: Once a week    Frequency of Social Gatherings with Friends and Family: Once a week    Attends Religious Services: 1 to 4 times per year    Active Member of [employer] or Organizations: Yes    Attends Banker Meetings: 1 to 4 times per year    Marital Status: Divorced  Intimate Partner Violence: Not At Risk (12/24/2023)   Humiliation, Afraid, Rape, and Kick questionnaire    Fear of Current or Ex-Partner: No    Emotionally Abused: No    Physically Abused: No    Sexually Abused: No    REVIEW OF SYSTEMS: New patient intake form was reviewed.  Complete 10-system review is negative except for the following: Shortness of breath, joint pain, back pain, anxiety, vision problems, abdominal pain, pain with intercourse, muscle cramp, depression, hot flashes, problems with walking, confusion  PHYSICAL EXAM: BP 110/77 (BP Location: Left Arm, Patient Position: Sitting)   Pulse 85   Temp 98.6 F (37 C)   Resp 17   Ht 5\' 1"  (1.549 m)   Wt 209 lb (94.8 kg)   LMP 03/28/2016   SpO2 98%   BMI 39.49 kg/m  Constitutional: No acute distress. Neuro/Psych: Alert, oriented.  Head and Neck: Normocephalic, atraumatic. Neck symmetric without masses. Sclera anicteric.  Respiratory: Normal work of breathing. Clear to auscultation bilaterally. Cardiovascular: Regular rate and rhythm, no murmurs, rubs, or gallops. Abdomen: Normoactive bowel sounds. Soft, non-distended, non-tender to palpation. Mass palpated in the lower abdomen. Extremities: Grossly normal range of motion.  Warm, well perfused. No edema bilaterally. Uses cane for ambulation Skin: No rashes or lesions. Lymphatic: No cervical, supraclavicular, or inguinal adenopathy. Genitourinary: External genitalia without lesions. Urethral meatus without lesions or prolapse. On speculum exam, vagina and cervix without lesions. Bimanual exam reveals mass in anterior cul-de-sac, somewhat mobile, with mild TTP on abdominal hand. Rectovaginal exam confirms the above findings and reveals normal sphincter tone and no masses or nodularity. Exam chaperoned by Vira Grieves, NP   LABORATORY AND RADIOLOGIC DATA: Outside medical records were reviewed to synthesize the above history, along with the history and physical obtained during the visit.  Outside laboratory, pathology, and imaging reports were reviewed, with pertinent results below.  I personally reviewed the outside images.  WBC  Date Value Ref Range Status  04/25/2022 7.6 4.0 - 10.5 K/uL Final   Hemoglobin  Date Value Ref Range Status  04/25/2022 11.3 (L) 12.0 - 15.0 g/dL Final  62/13/0865 78.4 11.1 - 15.9 g/dL Final   HCT  Date Value Ref Range Status  04/25/2022 34.5 (L) 36.0 - 46.0 %  Final   Hematocrit  Date Value Ref Range Status  01/31/2022 35.5 34.0 - 46.6 % Final   Platelets  Date Value Ref Range Status  04/25/2022 334 150 - 400 K/uL Final  01/31/2022 344 150 - 450 x10E3/uL Final   Magnesium   Date Value Ref Range Status  02/25/2013 2.0 1.5 - 2.5 mg/dL Final   Creat  Date Value Ref Range Status  10/09/2016 0.67 0.50 - 1.10 mg/dL Final   Creatinine, Ser  Date Value Ref Range Status  07/24/2023 0.75 0.57 - 1.00 mg/dL Final   AST  Date Value Ref Range Status  07/24/2023 14 0 - 40 IU/L Final   ALT  Date Value Ref Range Status  07/24/2023 7 0 - 32 IU/L Final   Cancer Antigen (CA) 125  Date Value Ref Range Status  11/21/2023 11.7 0.0 - 38.1 U/mL Final    Comment:    Roche Diagnostics Electrochemiluminescence Immunoassay  (ECLIA) Values obtained with different assay methods or kits cannot be used interchangeably.  Results cannot be interpreted as absolute evidence of the presence or absence of malignant disease.    Diagnosis  Date Value Ref Range Status  10/31/2021   Final   - Negative for intraepithelial lesion or malignancy (NILM)  04/24/2018   Final   NEGATIVE FOR INTRAEPITHELIAL LESIONS OR MALIGNANCY.   HPV  Date Value Ref Range Status  04/24/2018 NOT DETECTED  Final    Comment:    Normal Reference Range - NOT Detected    MR PELVIS W WO CONTRAST 12/06/2023  Narrative CLINICAL DATA:  Ovarian mass, history of breast cancer  EXAM: MRI PELVIS WITHOUT AND WITH CONTRAST  TECHNIQUE: Multiplanar multisequence MR imaging of the pelvis was performed both before and after administration of intravenous contrast.  CONTRAST:  9mL GADAVIST  GADOBUTROL  1 MMOL/ML IV SOLN  COMPARISON:  Pelvic ultrasound, 11/08/2023  FINDINGS: Urinary Tract:  No abnormality visualized.  Bowel:  Unremarkable visualized pelvic bowel loops.  Vascular/Lymphatic: No pathologically enlarged lymph nodes. No significant vascular abnormality seen.  Reproductive: Large mixed solid and cystic mass situated in the anterior pelvis, and arising from the left ovary, measuring 11.3 x 9.2 x 8.9 cm (series 5, image 17, series 6, image 16). There is a dominant cystic component anteriorly and posteriorly a large, complex solid component with low-level internal contrast enhancement. Normal postmenopausal appearance of the right ovary. The uterus is retroflexed by mass effect but otherwise normal in postmenopausal appearance.  Other: Moderate volume free fluid in the pelvis. Nonenhancing rounded peritoneal body in the dependent low pelvis adjacent to the rectum measuring 1.3 cm (series 5, image 21).  Musculoskeletal: No suspicious bone lesions identified.  IMPRESSION: 1. Large mixed solid and cystic mass situated in the  anterior pelvis, and arising from the left ovary, measuring 11.3 x 9.2 x 8.9 cm. There is a dominant cystic component anteriorly and posteriorly a large, complex solid component with low-level internal contrast enhancement. 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 2. Moderate volume free fluid in the pelvis. Nonenhancing rounded peritoneal body in the dependent low pelvis adjacent to the rectum measuring 1.3 cm, of uncertain nature, possibly an epiploic appendage or dropped gallstone although concerning for a peritoneal metastasis in this setting. 3. No evidence of lymphadenopathy or other metastatic disease in the pelvis.  These results will be called to the ordering clinician or representative by the Radiologist Assistant, and communication documented in the PACS or [employer].   Electronically Signed By: Evonne Hoist.D.  On: 12/15/2023 07:39   US  PELVIC COMPLETE W TRANSVAGINAL AND TORSION R/O 11/08/2023  Narrative CLINICAL DATA:  Right ovarian cyst  EXAM: TRANSABDOMINAL AND TRANSVAGINAL ULTRASOUND OF PELVIS  DOPPLER ULTRASOUND OF OVARIES  TECHNIQUE: Both transabdominal and transvaginal ultrasound examinations of the pelvis were performed. Transabdominal technique was performed for global imaging of the pelvis including uterus, ovaries, adnexal regions, and pelvic cul-de-sac.  It was necessary to proceed with endovaginal exam following the transabdominal exam to visualize the uterus and adnexa. Color and duplex Doppler ultrasound was utilized to evaluate blood flow to the ovaries.  COMPARISON:  Ultrasound 12/20/2010  FINDINGS: Uterus  Measurements: 6.4 x 3.8 x 4.4 cm = volume: 56 mL. No fibroids or other mass visualized.  Endometrium  Thickness: 6 mm. Trace fluid in the endometrial canal. No focal abnormality visualized.  Right ovary  Not visualized.  Left ovary  Measurements: 11.3 x 8.2 x 10.1 cm = volume: 480 mL. Complex  cystic lesion in the left ovary measuring 6.7 x 6.6 x 6.6 cm. This contains heterogenous internal echogenicity and vascular flow.  Pulsed Doppler evaluation of both ovaries demonstrates normal low-resistance arterial and venous waveforms.  Other findings  Small volume free fluid in the pelvis.  IMPRESSION: 1. Complex cystic lesion in the left ovary measuring 6.7 cm. This contains heterogenous internal echogenicity and possible low-level vascular flow. Consider GYN consult and followup US  in 3-6 months, or pelvis MRI w/o and w/ contrast for improved characterization. Note: This recommendation does not apply to premenarchal patients or to those with increased risk (genetic, family history, elevated tumor markers or other high-risk factors) of ovarian cancer. Reference: Radiology 2019 Nov; 293(2):359-371. 2. Right ovary not visualized.   Electronically Signed By: Rozell Cornet M.D. On: 11/20/2023 03:17

## 2024-01-06 NOTE — H&P (Signed)
 REFERRING PHYSICIAN:  Constant   PROVIDER:  Eppie Hasting, MD   Care Team: Patient Care Team: Amao, Enobong, MD as PCP - General (Family Medicine) Eppie Hasting, MD as Consulting Provider (Surgical Oncology) Pedro Bourgeois, MD (Gastroenterology) Verlyn Goad, MD (Obstetrics and Gynecology)    MRN: [id number] DOB: 1971/08/20 DATE OF ENCOUNTER: 12/13/2023   Subjective    Chief Complaint: NEW BREAST CANCER       History of Present Illness: [name] is a 53 y.o. female who is seen today as an office consultation at the request of Dr. Dodie Frees for evaluation of NEW BREAST CANCER .   Patient presents with a new diagnosis of left breast cancer March 2025.  She had screening detected left breast calcifications.  Diagnostic imaging was performed.  This showed a 4 mm group of calcifications in the retroareolar region of the left breast.  Core needle biopsy was performed.  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  There was insufficient material for prognostic panel.   History of Present Illness The patient is also scheduled for a colonoscopy in the near future and has a history of asthma, which has previously required hospitalization and ventilation over 10 years ago. The patient's asthma is currently managed with Advair  daily and albuterol  as needed. The patient also has a family history of various cancers, including lung and breast cancer.     Family cancer history -patient's maternal grandmother and paternal grandmother both had breast cancer.  Mother had colon cancer.  Father had lung cancer.   Work -patient is not currently working.  Patient does do a lot of crochet.       Diagnostic mammogram:BCG  11/29/23 ACR Breast Density Category b: There are scattered areas of fibroglandular density.  FINDINGS: There is a 4 mm group of coarse heterogeneous calcifications that appears reside within a mildly distended duct in the retroareolar left  breast.  IMPRESSION: 1. 4 mm group of suspicious calcifications in the retroareolar left breast. Tissue sampling is recommended.  RECOMMENDATION: 1. Stereotactic core needle biopsy of the left breast calcifications.  I have discussed the findings and recommendations with the patient. If applicable, a reminder letter will be sent to the patient regarding the next appointment.  BI-RADS CATEGORY  4: Suspicious.     Pathology core needle biopsy: 12/05/2023 1. Breast, left, needle core biopsy, retroareolar :       HIGH-GRADE DUCTAL CARCINOMA IN SITU, SOLID AND CLINGING TYPES WITH NECROSIS AND       CALCIFICATIONS       NEGATIVE FOR INVASIVE CARCINOMA       DCIS MEASURES 4 MM IN GREATEST LINEAR EXTENT  Receptors:un positive for weight loss, visual disturbance, leg swelling, shortness of breath, vomiting, abdominal pain, constipation, easy bruising and bleeding, dizziness, depression, anxious.  Able to be performed     Review of Systems: A complete review of systems was obtained from the patient.  I have reviewed this information and discussed as appropriate with the patient.  See HPI as well for other ROS. ROS -        Medical History: Past Medical History      Past Medical History:  Diagnosis Date   Anxiety     Asthma, unspecified asthma severity, unspecified whether complicated, unspecified whether persistent (HHS-HCC)     History of cancer          Problem List     Patient Active Problem List  Diagnosis   Malignant neoplasm  of central portion of left female breast (CMS/HHS-HCC)   Moderate asthma without complication (HHS-HCC)   Cyst of left ovary        Past Surgical History       Past Surgical History:  Procedure Laterality Date   CATARACT EXTRACTION       CESAREAN SECTION            Allergies      Allergies  Allergen Reactions   Bupropion Hives   Penicillins Hives        Medications Ordered Prior to Encounter        Current Outpatient Medications on  File Prior to Visit  Medication Sig Dispense Refill   albuterol  MDI, PROVENTIL , VENTOLIN , PROAIR , HFA 90 mcg/actuation inhaler Inhale 2 Inhalations into the lungs every 6 (six) hours as needed       brimonidine-timoloL (COMBIGAN) 0.2-0.5 % ophthalmic solution Apply 1 drop to eye       busPIRone  (BUSPAR ) 15 MG tablet Take 1 tablet by mouth 2 (two) times daily       cariprazine  (VRAYLAR ) 1.5 mg capsules Take 2 capsules by mouth once daily       cloNIDine  HCL (CATAPRES ) 0.2 MG tablet Take 0.2 mg by mouth       cyclobenzaprine  (FLEXERIL ) 10 MG tablet Take 10 mg by mouth 2 (two) times daily as needed       fluticasone  propion-salmeteroL (ADVAIR  DISKUS) 500-50 mcg/dose diskus inhaler Inhale 1 Puff into the lungs 2 (two) times daily       FUROsemide  (LASIX ) 20 MG tablet Take 20 mg by mouth once daily as needed       gabapentin  (NEURONTIN ) 300 MG capsule Take 600 mg by mouth 2 (two) times daily       KLOXXADO 8 mg/actuation Spry USE AS DIRECTED FOR OPIOID OVERDOSE       lactulose  (ENULOSE ) 10 gram/15 mL oral solution Take 10 g by mouth       lidocaine  (LIDODERM ) 5 % patch Place 1 patch onto the skin once daily       meloxicam  (MOBIC ) 7.5 MG tablet Take 7.5 mg by mouth       nystatin  (MYCOSTATIN ) 100,000 unit/mL suspension Take 500,000 Units by mouth       oxyCODONE -acetaminophen  (PERCOCET) 10-325 mg tablet Take 1 tablet by mouth 4 (four) times daily as needed       prazosin  (MINIPRESS ) 1 MG capsule Take 1 capsule by mouth at bedtime for nightmares       cholecalciferol 1000 unit tablet Take 1 tablet by mouth once daily       clotrimazole  (LOTRIMIN ) 1 % cream Apply 1 Application topically 2 (two) times daily       DULoxetine  (CYMBALTA ) 60 MG DR capsule Take 60 mg by mouth once daily       FLUoxetine  (PROZAC ) 40 MG capsule Take 40 mg by mouth once daily       levocetirizine (XYZAL ) 5 MG tablet Take 5 mg by mouth every evening       traZODone  (DESYREL ) 50 MG tablet Take 50 mg by mouth at bedtime         No current facility-administered medications on file prior to visit.        Family History       Family History  Problem Relation Age of Onset   High blood pressure (Hypertension) Mother     Colon cancer Mother     Stroke Father     High  blood pressure (Hypertension) Father     Coronary Artery Disease (Blocked arteries around heart) Father          Tobacco Use History  Social History        Tobacco Use  Smoking Status Former   Types: Cigarettes   Start date: 2023  Smokeless Tobacco Never        Social History  Social History         Socioeconomic History   Marital status: Divorced  Tobacco Use   Smoking status: Former      Types: Cigarettes      Start date: 2023   Smokeless tobacco: Never  Substance and Sexual Activity   Alcohol  use: Defer   Drug use: Defer    Social Drivers of Acupuncturist Strain: High Risk (06/26/2023)    Received from [facility]    Overall Financial Resource Strain (CARDIA)     Difficulty of Paying Living Expenses: Very hard  Food Insecurity: No Food Insecurity (06/26/2023)    Received from [facility]    Hunger Vital Sign     Worried About Running Out of Food in the Last Year: Never true     Ran Out of Food in the Last Year: Never true  Transportation Needs: No Transportation Needs (06/26/2023)    Received from [facility] - Transportation     Lack of Transportation (Medical): No     Lack of Transportation (Non-Medical): No  Physical Activity: Insufficiently Active (06/26/2023)    Received from [facility]    Exercise Vital Sign     Days of Exercise per Week: 7 days     Minutes of Exercise per Session: 10 min  Stress: No Stress Concern Present (06/26/2023)    Received from [facility] - Occupational Stress Questionnaire     Feeling of Stress : Only a little  Social Connections: Moderately Isolated (06/26/2023)    Received from [facility]    Social  Connection and Isolation Panel [NHANES]     Frequency of Communication with Friends and Family: Once a week     Frequency of Social Gatherings with Friends and Family: Once a week     Attends Religious Services: 1 to 4 times per year     Active Member of Clubs or Organizations: Yes     Attends Banker Meetings: 1 to 4 times per year     Marital Status: Divorced        Objective:          Vitals:    12/13/23 1142 12/13/23 1143  BP: (!) 142/83    Pulse: 90    Temp: 36.7 C (98 F)    SpO2: 98%    Weight: 98.6 kg (217 lb 6.4 oz)    Height: 154.9 cm (5\' 1" )    PainSc:     8  PainLoc:   Breast    Body mass index is 41.08 kg/m.   Gen:  No acute distress.  Well nourished and well groomed.   Neurological: Alert and oriented to person, place, and time. Coordination normal.  Head: Normocephalic and atraumatic.  Eyes: Conjunctivae are normal. Pupils are equal, round, and reactive to light. No scleral icterus.  Neck: Normal range of motion. Neck supple. No tracheal deviation or thyromegaly present.  Cardiovascular: Normal rate, regular rhythm, normal heart sounds and intact distal pulses.  Exam reveals no gallop and no friction rub.  No murmur heard. Breast: Ptotic bilaterally.  Relatively equivalent in size.  Left breast is sore with tiny hematoma at the biopsy site just lateral to the left areola.  There is no nipple discharge or nipple retraction.  There is no nipple deviation.  I cannot detect any skin dimpling or lymphedema in the breast.  There is no axillary adenopathy.  Right breast is benign in exam. Respiratory: Effort normal.  No respiratory distress. No chest wall tenderness. Breath sounds normal.  No wheezes, rales or rhonchi.  GI: Soft. Bowel sounds are normal. The abdomen is soft and nontender.  There is no rebound and no guarding.  Musculoskeletal: Normal range of motion. Extremities are nontender.  Lymphadenopathy: No cervical, preauricular, postauricular or  axillary adenopathy is present Skin: Skin is warm and dry. No rash noted. No diaphoresis. No erythema. No pallor. No clubbing, cyanosis, or edema.   Psychiatric: Normal mood and affect. Behavior is normal. Judgment and thought content normal.      Labs None recent   Assessment and Plan:        ICD-10-CM    1. Malignant neoplasm of central portion of left female breast, unspecified estrogen receptor status (CMS/HHS-HCC)  C50.112 Ambulatory Referral to Cancer Genetics - [id number]      Ambulatory Referral to Oncology-Medical      Ambulatory Referral to Radiation Oncology     2. Moderate persistent asthma without complication (HHS-HCC)  J45.40       3. Cyst of left ovary  N83.202         Assessment & Plan Stage 0 noninvasive breast cancer Stage 0 noninvasive breast cancer with a 4 mm tumor, treatable with lumpectomy and likely radiation. Antihormone therapy to be considered post-surgery based on hormone receptor status. Excellent prognosis.  Genetic counseling recommended due to family history. - Schedule lumpectomy. - Refer to radiology for pre-surgical marker placement. - Refer to genetics for counseling and potential testing. - Refer to medical oncology for evaluation of hormone receptor status post-surgery. - Refer to radiation oncology for post-surgical radiation therapy. - Advise obtaining a specialized compression bra from the [employer] [place] store. - Prescribe narcotic analgesics post-surgery, with option to request more if needed. - Advise on post-surgical care: no strenuous activity or heavy lifting for a week, showering guidelines, and potential risks of surgery.  Reviewed risks of bleeding, infection, postop pain, seroma, possible need for additional surgeries or procedures, possible recurrent breast cancer, possible heart or lung complications, possible blood clot.   Family history of cancer Significant family history of cancer, including breast cancer in both grandmothers  and lung cancer in her father. - Discuss family history with [employer]ner, broadcasting/film/video.   Ovarian    Asthma Asthma well-controlled with daily Advair  and as-needed albuterol . - Continue Advair  daily. - Use albuterol  as needed.   Follow-up Breast surgery scheduled post-April 18 to accommodate personal events and recovery time.

## 2024-01-07 ENCOUNTER — Encounter: Payer: Self-pay | Admitting: Psychiatry

## 2024-01-07 ENCOUNTER — Encounter: Payer: Self-pay | Admitting: Gynecologic Oncology

## 2024-01-07 DIAGNOSIS — N9489 Other specified conditions associated with female genital organs and menstrual cycle: Secondary | ICD-10-CM | POA: Insufficient documentation

## 2024-01-07 LAB — CANCER ANTIGEN 19-9: CA 19-9: <2 U/mL (ref 0–35)

## 2024-01-08 ENCOUNTER — Ambulatory Visit
Admission: RE | Admit: 2024-01-08 | Discharge: 2024-01-08 | Disposition: A | Discharge status: Home / Self Care | Payer: MEDICAID | Source: Ambulatory Visit | Attending: General Surgery | Admitting: General Surgery

## 2024-01-08 DIAGNOSIS — C50112 Malignant neoplasm of central portion of left female breast: Secondary | ICD-10-CM

## 2024-01-08 HISTORY — PX: BREAST BIOPSY: SHX20

## 2024-01-09 ENCOUNTER — Encounter (HOSPITAL_COMMUNITY): Payer: Self-pay | Admitting: General Surgery

## 2024-01-09 ENCOUNTER — Ambulatory Visit
Admission: RE | Admit: 2024-01-09 | Discharge: 2024-01-09 | Disposition: A | Discharge status: Home / Self Care | Payer: MEDICAID | Source: Ambulatory Visit | Attending: General Surgery | Admitting: General Surgery

## 2024-01-09 ENCOUNTER — Other Ambulatory Visit: Payer: Self-pay

## 2024-01-09 ENCOUNTER — Ambulatory Visit (HOSPITAL_BASED_OUTPATIENT_CLINIC_OR_DEPARTMENT_OTHER): Payer: MEDICAID | Admitting: Anesthesiology

## 2024-01-09 ENCOUNTER — Ambulatory Visit (HOSPITAL_COMMUNITY): Payer: MEDICAID | Admitting: Physician Assistant

## 2024-01-09 ENCOUNTER — Ambulatory Visit (HOSPITAL_COMMUNITY)
Admission: RE | Admit: 2024-01-09 | Discharge: 2024-01-09 | Disposition: A | Discharge status: Home / Self Care | Payer: MEDICAID | Attending: General Surgery | Admitting: General Surgery

## 2024-01-09 ENCOUNTER — Encounter (HOSPITAL_COMMUNITY)
Admission: RE | Disposition: A | Discharge status: Home / Self Care | Payer: Self-pay | Source: Home / Self Care | Attending: General Surgery

## 2024-01-09 DIAGNOSIS — I10 Essential (primary) hypertension: Secondary | ICD-10-CM

## 2024-01-09 DIAGNOSIS — C50112 Malignant neoplasm of central portion of left female breast: Secondary | ICD-10-CM

## 2024-01-09 DIAGNOSIS — F419 Anxiety disorder, unspecified: Secondary | ICD-10-CM | POA: Insufficient documentation

## 2024-01-09 DIAGNOSIS — F319 Bipolar disorder, unspecified: Secondary | ICD-10-CM | POA: Insufficient documentation

## 2024-01-09 DIAGNOSIS — Z79899 Other long term (current) drug therapy: Secondary | ICD-10-CM | POA: Diagnosis not present

## 2024-01-09 DIAGNOSIS — J449 Chronic obstructive pulmonary disease, unspecified: Secondary | ICD-10-CM

## 2024-01-09 DIAGNOSIS — Z87891 Personal history of nicotine dependence: Secondary | ICD-10-CM

## 2024-01-09 DIAGNOSIS — Z7951 Long term (current) use of inhaled steroids: Secondary | ICD-10-CM | POA: Diagnosis not present

## 2024-01-09 HISTORY — PX: BREAST LUMPECTOMY: SHX2

## 2024-01-09 HISTORY — PX: BREAST LUMPECTOMY WITH RADIOACTIVE SEED LOCALIZATION: SHX6424

## 2024-01-09 SURGERY — BREAST LUMPECTOMY WITH RADIOACTIVE SEED LOCALIZATION
Anesthesia: General | Site: Breast | Laterality: Left

## 2024-01-09 MED ORDER — HYDROMORPHONE HCL 1 MG/ML IJ SOLN
0.2500 mg | INTRAMUSCULAR | Status: DC | PRN
  Administered 2024-01-09 (×2): 0.5 mg via INTRAVENOUS

## 2024-01-09 MED ORDER — ONDANSETRON HCL 4 MG/2ML IJ SOLN: 4.0000 mg | Freq: Once | INTRAMUSCULAR | Status: DC | PRN

## 2024-01-09 MED ORDER — LIDOCAINE 2% (20 MG/ML) 5 ML SYRINGE
INTRAMUSCULAR | Status: DC | PRN
  Administered 2024-01-09: 60 mg via INTRAVENOUS

## 2024-01-09 MED ORDER — ACETAMINOPHEN 500 MG PO TABS
1000.0000 mg | ORAL_TABLET | ORAL | Status: AC
Start: 2024-01-09 — End: 2024-01-09
  Administered 2024-01-09: 1000 mg via ORAL
  Filled 2024-01-09: qty 2

## 2024-01-09 MED ORDER — CHLORHEXIDINE GLUCONATE CLOTH 2 % EX PADS: 6.0000 | MEDICATED_PAD | Freq: Once | CUTANEOUS | Status: DC

## 2024-01-09 MED ORDER — HYDROMORPHONE HCL 1 MG/ML IJ SOLN
INTRAMUSCULAR | Status: AC
  Filled 2024-01-09: qty 1

## 2024-01-09 MED ORDER — EPHEDRINE 5 MG/ML INJ
INTRAVENOUS | Status: AC
  Filled 2024-01-09: qty 5

## 2024-01-09 MED ORDER — OXYCODONE HCL 5 MG PO TABS: 5.0000 mg | ORAL_TABLET | Freq: Four times a day (QID) | ORAL | 0 refills | Status: DC | PRN

## 2024-01-09 MED ORDER — FENTANYL CITRATE (PF) 250 MCG/5ML IJ SOLN
INTRAMUSCULAR | Status: AC
  Filled 2024-01-09: qty 5

## 2024-01-09 MED ORDER — OXYCODONE HCL 5 MG/5ML PO SOLN
5.0000 mg | Freq: Once | ORAL | Status: AC | PRN
  Administered 2024-01-09: 5 mg via ORAL

## 2024-01-09 MED ORDER — MIDAZOLAM HCL 2 MG/2ML IJ SOLN
INTRAMUSCULAR | Status: AC
  Filled 2024-01-09: qty 2

## 2024-01-09 MED ORDER — PROPOFOL 10 MG/ML IV BOLUS
INTRAVENOUS | Status: DC | PRN
  Administered 2024-01-09: 130 mg via INTRAVENOUS

## 2024-01-09 MED ORDER — PROPOFOL 10 MG/ML IV BOLUS
INTRAVENOUS | Status: AC
  Filled 2024-01-09: qty 20

## 2024-01-09 MED ORDER — FENTANYL CITRATE (PF) 250 MCG/5ML IJ SOLN
INTRAMUSCULAR | Status: DC | PRN
  Administered 2024-01-09 (×3): 50 ug via INTRAVENOUS
  Administered 2024-01-09: 100 ug via INTRAVENOUS

## 2024-01-09 MED ORDER — ONDANSETRON HCL 4 MG/2ML IJ SOLN
INTRAMUSCULAR | Status: AC
Start: 2024-01-09 — End: ?
  Filled 2024-01-09: qty 2

## 2024-01-09 MED ORDER — OXYCODONE HCL 5 MG PO TABS: 5.0000 mg | ORAL_TABLET | Freq: Once | ORAL | Status: AC | PRN

## 2024-01-09 MED ORDER — 0.9 % SODIUM CHLORIDE (POUR BTL) OPTIME
TOPICAL | Status: DC | PRN
  Administered 2024-01-09: 1000 mL

## 2024-01-09 MED ORDER — ORAL CARE MOUTH RINSE: 15.0000 mL | Freq: Once | OROMUCOSAL | Status: AC

## 2024-01-09 MED ORDER — OXYCODONE HCL 5 MG/5ML PO SOLN
ORAL | Status: AC
  Filled 2024-01-09: qty 5

## 2024-01-09 MED ORDER — LACTATED RINGERS IV SOLN: INTRAVENOUS | Status: DC

## 2024-01-09 MED ORDER — ONDANSETRON HCL 4 MG/2ML IJ SOLN
INTRAMUSCULAR | Status: DC | PRN
  Administered 2024-01-09: 4 mg via INTRAVENOUS

## 2024-01-09 MED ORDER — CHLORHEXIDINE GLUCONATE 0.12 % MT SOLN
15.0000 mL | Freq: Once | OROMUCOSAL | Status: AC
  Administered 2024-01-09: 15 mL via OROMUCOSAL
  Filled 2024-01-09: qty 15

## 2024-01-09 MED ORDER — LIDOCAINE HCL 1 % IJ SOLN
INTRAMUSCULAR | Status: AC
  Filled 2024-01-09: qty 20

## 2024-01-09 MED ORDER — BUPIVACAINE-EPINEPHRINE (PF) 0.25% -1:200000 IJ SOLN
INTRAMUSCULAR | Status: AC
  Filled 2024-01-09: qty 30

## 2024-01-09 MED ORDER — LIDOCAINE HCL 1 % IJ SOLN
INTRAMUSCULAR | Status: DC | PRN
  Administered 2024-01-09: 40 mL

## 2024-01-09 MED ORDER — CIPROFLOXACIN IN D5W 400 MG/200ML IV SOLN
400.0000 mg | INTRAVENOUS | Status: AC
  Administered 2024-01-09: 400 mg via INTRAVENOUS
  Filled 2024-01-09: qty 200

## 2024-01-09 MED ORDER — MIDAZOLAM HCL 2 MG/2ML IJ SOLN
INTRAMUSCULAR | Status: DC | PRN
  Administered 2024-01-09: 2 mg via INTRAVENOUS

## 2024-01-09 MED ORDER — DEXAMETHASONE SODIUM PHOSPHATE 10 MG/ML IJ SOLN
INTRAMUSCULAR | Status: DC | PRN
  Administered 2024-01-09: 10 mg via INTRAVENOUS

## 2024-01-09 MED ORDER — LIDOCAINE 2% (20 MG/ML) 5 ML SYRINGE
INTRAMUSCULAR | Status: AC
  Filled 2024-01-09: qty 5

## 2024-01-09 MED ORDER — DEXAMETHASONE SODIUM PHOSPHATE 10 MG/ML IJ SOLN
INTRAMUSCULAR | Status: AC
  Filled 2024-01-09: qty 1

## 2024-01-09 MED ORDER — PHENYLEPHRINE 80 MCG/ML (10ML) SYRINGE FOR IV PUSH (FOR BLOOD PRESSURE SUPPORT)
PREFILLED_SYRINGE | INTRAVENOUS | Status: AC
  Filled 2024-01-09: qty 10

## 2024-01-09 SURGICAL SUPPLY — 35 items
BAG COUNTER SPONGE SURGICOUNT (BAG) ×2 IMPLANT
BINDER BREAST LRG (GAUZE/BANDAGES/DRESSINGS) IMPLANT
BINDER BREAST XLRG (GAUZE/BANDAGES/DRESSINGS) IMPLANT
CANISTER SUCT 3000ML PPV (MISCELLANEOUS) IMPLANT
CHLORAPREP W/TINT 26 (MISCELLANEOUS) ×2 IMPLANT
CLIP TI LARGE 6 (CLIP) ×2 IMPLANT
COVER PROBE W GEL 5X96 (DRAPES) ×2 IMPLANT
COVER SURGICAL LIGHT HANDLE (MISCELLANEOUS) ×2 IMPLANT
DERMABOND ADVANCED .7 DNX12 (GAUZE/BANDAGES/DRESSINGS) ×2 IMPLANT
DEVICE DUBIN SPECIMEN MAMMOGRA (MISCELLANEOUS) ×2 IMPLANT
DRAPE CHEST BREAST 15X10 FENES (DRAPES) ×2 IMPLANT
ELECT COATED BLADE 2.86 ST (ELECTRODE) ×2 IMPLANT
ELECTRODE REM PT RTRN 9FT ADLT (ELECTROSURGICAL) ×2 IMPLANT
GAUZE PAD ABD 8X10 STRL (GAUZE/BANDAGES/DRESSINGS) ×2 IMPLANT
GAUZE SPONGE 4X4 12PLY STRL LF (GAUZE/BANDAGES/DRESSINGS) ×2 IMPLANT
GLOVE BIO SURGEON STRL SZ 6 (GLOVE) ×2 IMPLANT
GLOVE INDICATOR 6.5 STRL GRN (GLOVE) ×2 IMPLANT
GOWN STRL REUS W/ TWL LRG LVL3 (GOWN DISPOSABLE) ×2 IMPLANT
GOWN STRL REUS W/ TWL XL LVL3 (GOWN DISPOSABLE) ×2 IMPLANT
KIT BASIN OR (CUSTOM PROCEDURE TRAY) ×2 IMPLANT
KIT MARKER MARGIN INK (KITS) ×2 IMPLANT
LIGHT WAVEGUIDE WIDE FLAT (MISCELLANEOUS) IMPLANT
NDL HYPO 25GX1X1/2 BEV (NEEDLE) ×2 IMPLANT
NEEDLE HYPO 25GX1X1/2 BEV (NEEDLE) ×1 IMPLANT
NS IRRIG 1000ML POUR BTL (IV SOLUTION) IMPLANT
PACK GENERAL/GYN (CUSTOM PROCEDURE TRAY) ×2 IMPLANT
PAD ABD 8X10 STRL (GAUZE/BANDAGES/DRESSINGS) IMPLANT
STRIP CLOSURE SKIN 1/2X4 (GAUZE/BANDAGES/DRESSINGS) ×2 IMPLANT
SUT MNCRL AB 4-0 PS2 18 (SUTURE) ×2 IMPLANT
SUT SILK 2 0 SH (SUTURE) IMPLANT
SUT VIC AB 2-0 SH 27XBRD (SUTURE) IMPLANT
SUT VIC AB 3-0 SH 27X BRD (SUTURE) ×2 IMPLANT
SYR CONTROL 10ML LL (SYRINGE) ×2 IMPLANT
TOWEL GREEN STERILE (TOWEL DISPOSABLE) ×2 IMPLANT
TOWEL GREEN STERILE FF (TOWEL DISPOSABLE) ×2 IMPLANT

## 2024-01-09 NOTE — Interval H&P Note (Signed)
 History and Physical Interval Note:  01/09/2024 12:28 PM  [name]  has presented today for surgery, with the diagnosis of LEFT BREAST CANCER.  The various methods of treatment have been discussed with the patient and family. After consideration of risks, benefits and other options for treatment, the patient has consented to  Procedure(s) with comments: BREAST LUMPECTOMY WITH RADIOACTIVE SEED LOCALIZATION (Left) - LEFT BREAST SEED LOCALIZED LUMPECTOMY as a surgical intervention.  The patient's history has been reviewed, patient examined, no change in status, stable for surgery.  I have reviewed the patient's chart and labs.  Questions were answered to the patient's satisfaction.     Lockie Rima

## 2024-01-09 NOTE — Anesthesia Preprocedure Evaluation (Addendum)
 Anesthesia Evaluation  Patient identified by MRN, date of birth, ID band Patient awake    Reviewed: Allergy & Precautions, NPO status , Patient's Chart, lab work & pertinent test results  Airway Mallampati: III  TM Distance: >3 FB     Dental  (+) Edentulous Upper, Edentulous Lower   Pulmonary asthma , COPD,  COPD inhaler, former smoker   Pulmonary exam normal breath sounds clear to auscultation       Cardiovascular hypertension, Pt. on medications Normal cardiovascular exam Rhythm:Regular Rate:Normal     Neuro/Psych  PSYCHIATRIC DISORDERS Anxiety Depression Bipolar Disorder   Neuropathy hands   Neuromuscular disease    GI/Hepatic negative GI ROS, Neg liver ROS,,,  Endo/Other    Class 3 obesityLeft breast Ca  Renal/GU negative Renal ROS  negative genitourinary   Musculoskeletal Chronic back pain   Abdominal  (+) + obese  Peds  Hematology negative hematology ROS (+)   Anesthesia Other Findings   Reproductive/Obstetrics                             Anesthesia Physical Anesthesia Plan  ASA: 3  Anesthesia Plan: General   Post-op Pain Management: Dilaudid  IV   Induction: Intravenous  PONV Risk Score and Plan: 4 or greater and Treatment may vary due to age or medical condition, Ondansetron  and Dexamethasone   Airway Management Planned: LMA  Additional Equipment: None  Intra-op Plan:   Post-operative Plan: Extubation in OR  Informed Consent: I have reviewed the patients History and Physical, chart, labs and discussed the procedure including the risks, benefits and alternatives for the proposed anesthesia with the patient or authorized representative who has indicated his/her understanding and acceptance.     Dental advisory given  Plan Discussed with: CRNA and Anesthesiologist  Anesthesia Plan Comments:        Anesthesia Quick Evaluation

## 2024-01-09 NOTE — Anesthesia Procedure Notes (Signed)
 Procedure Name: LMA Insertion Date/Time: 01/09/2024 12:46 PM  Performed by: Bennett Brass, CRNAPre-anesthesia Checklist: Patient identified, Emergency Drugs available, Suction available, Patient being monitored and Timeout performed Patient Re-evaluated:Patient Re-evaluated prior to induction Oxygen Delivery Method: Circle system utilized Preoxygenation: Pre-oxygenation with 100% oxygen Induction Type: IV induction LMA Size: 4.0 Number of attempts: 1

## 2024-01-09 NOTE — Transfer of Care (Signed)
 Immediate Anesthesia Transfer of Care Note  Patient: [name]  Procedure(s) Performed: BREAST LUMPECTOMY WITH RADIOACTIVE SEED LOCALIZATION (Left: Breast)  Patient Location: PACU  Anesthesia Type:General  Level of Consciousness: awake  Airway & Oxygen Therapy: Patient Spontanous Breathing and Patient connected to face mask oxygen  Post-op Assessment: Report given to RN and Post -op Vital signs reviewed and stable  Post vital signs: Reviewed and stable  Last Vitals:  Vitals Value Taken Time  BP 130/89 01/09/24 1354  Temp    Pulse 67 01/09/24 1356  Resp 15 01/09/24 1356  SpO2 100 % 01/09/24 1356  Vitals shown include unfiled device data.  Last Pain:  Vitals:   01/09/24 1017  TempSrc:   PainSc: 0-No pain         Complications: No notable events documented.

## 2024-01-09 NOTE — Anesthesia Postprocedure Evaluation (Signed)
 Anesthesia Post Note  Patient: [name]  Procedure(s) Performed: BREAST LUMPECTOMY WITH RADIOACTIVE SEED LOCALIZATION (Left: Breast)     Patient location during evaluation: PACU Anesthesia Type: General Level of consciousness: awake and alert and oriented Pain management: pain level controlled Vital Signs Assessment: post-procedure vital signs reviewed and stable Respiratory status: spontaneous breathing, nonlabored ventilation and respiratory function stable Cardiovascular status: blood pressure returned to baseline and stable Postop Assessment: no apparent nausea or vomiting Anesthetic complications: no   No notable events documented.  Last Vitals:  Vitals:   01/09/24 1415 01/09/24 1430  BP: (!) 141/88 (!) 133/94  Pulse: 67 65  Resp: 14 14  Temp:  36.4 C  SpO2: 99% 95%    Last Pain:  Vitals:   01/09/24 1415  TempSrc:   PainSc: 6                  Virgle Arth A.

## 2024-01-09 NOTE — Progress Notes (Signed)
 [facility] CONSULT NOTE  Patient Care Team: Joaquin Mulberry, MD as PCP - General (Family Medicine) Auther Bo, RN as Oncology Nurse Navigator Alane Hsu, RN as Oncology Nurse Navigator Murleen Arms, MD as Consulting Physician (Hematology and Oncology)  CHIEF COMPLAINTS/PURPOSE OF CONSULTATION:  Newly diagnosed breast cancer  HISTORY OF PRESENTING ILLNESS:  [name] 53 y.o. female is here because of recent diagnosis of left breast  I reviewed her records extensively and collaborated the history with the patient.  SUMMARY OF ONCOLOGIC HISTORY: Oncology History  Ductal carcinoma in situ (DCIS) of left breast  11/08/2023 Mammogram   In the left breast, calcifications warrant further evaluation. In the right breast, no findings suspicious for malignancy.   12/05/2023 Pathology Results   High grade DCIS, solid and clinging types with necrosis and calcs. Neg for invasive carcinoma. Insufficienct residual tumor within the deeper prognostic marker immunohistochemical stains to determine the hormone status of the tumor.   12/24/2023 Initial Diagnosis   Ductal carcinoma in situ (DCIS) of left breast    She is scheduled for surgery on 02/04/2024.  MEDICAL HISTORY:  Past Medical History:  Diagnosis Date   Allergy    pollen, pcn   Anxiety    Asthma    Bipolar disorder (HCC)    Breast cancer (HCC)    Cataract    COPD (chronic obstructive pulmonary disease) (HCC)    Depression    Fibroids    Glaucoma    History of ovarian cyst    Hypertension    Menometrorrhagia    Ovarian cancer (HCC)    Sickle cell trait (HCC)     SURGICAL HISTORY: Past Surgical History:  Procedure Laterality Date   BREAST BIOPSY Left 12/05/2023   MM LT BREAST BX W LOC DEV 1ST LESION IMAGE BX SPEC STEREO GUIDE 12/05/2023 [facility]   BREAST BIOPSY  01/08/2024   MM LT RADIOACTIVE SEED LOC MAMMO GUIDE 01/08/2024 [facility]   CATARACT EXTRACTION, BILATERAL      CESAREAN SECTION     all three pregnancies   COLONOSCOPY  07/04/2021    SOCIAL HISTORY: Social History   Socioeconomic History   Marital status: Divorced    Spouse name: Not on file   Number of children: 3   Years of education: Not on file   Highest education level: Not on file  Occupational History   Not on file  Tobacco Use   Smoking status: Former    Current packs/day: 0.00    Types: Cigarettes    Quit date: 02/14/2016    Years since quitting: 7.9   Smokeless tobacco: Never   Tobacco comments:    3 cigarettes daily 06-22-21  Vaping Use   Vaping status: Never Used  Substance and Sexual Activity   Alcohol  use: Not Currently    Comment: socially   Drug use: No   Sexual activity: Yes    Partners: Male    Birth control/protection: None  Other Topics Concern   Not on file  Social History Narrative   Not on file   Social Drivers of Health   Financial Resource Strain: High Risk (06/26/2023)   Overall Financial Resource Strain (CARDIA)    Difficulty of Paying Living Expenses: Very hard  Food Insecurity: No Food Insecurity (12/24/2023)   Hunger Vital Sign    Worried About Running Out of Food in the Last Year: Never true    Ran Out of Food in the Last Year: Never true  Transportation  Needs: No Transportation Needs (12/24/2023)   PRAPARE - Administrator, Civil Service (Medical): No    Lack of Transportation (Non-Medical): No  Physical Activity: Insufficiently Active (06/26/2023)   Exercise Vital Sign    Days of Exercise per Week: 7 days    Minutes of Exercise per Session: 10 min  Stress: No Stress Concern Present (06/26/2023)   [employer] of Occupational Health - Occupational Stress Questionnaire    Feeling of Stress : Only a little  Social Connections: Moderately Isolated (06/26/2023)   Social Connection and Isolation Panel [NHANES]    Frequency of Communication with Friends and Family: Once a week    Frequency of Social Gatherings with Friends and  Family: Once a week    Attends Religious Services: 1 to 4 times per year    Active Member of [employer] or Organizations: Yes    Attends Banker Meetings: 1 to 4 times per year    Marital Status: Divorced  Catering manager Violence: Not At Risk (12/24/2023)   Humiliation, Afraid, Rape, and Kick questionnaire    Fear of Current or Ex-Partner: No    Emotionally Abused: No    Physically Abused: No    Sexually Abused: No    FAMILY HISTORY: Family History  Problem Relation Age of Onset   Breast cancer Mother    Colon cancer Mother    Colon polyps Mother    Hypertension Mother    Heart disease Father    Throat cancer Father    Breast cancer Maternal Grandmother    Lung cancer Maternal Grandfather    Breast cancer Paternal Grandmother    Other Neg Hx    Esophageal cancer Neg Hx    Rectal cancer Neg Hx    Stomach cancer Neg Hx    Prostate cancer Neg Hx    Pancreatic cancer Neg Hx    Ovarian cancer Neg Hx    Endometrial cancer Neg Hx     ALLERGIES:  is allergic to bupropion hcl er (sr), fruit & vegetable daily [nutritional supplements], penicillins, pollen extract-tree extract, and other.  MEDICATIONS:  Current Outpatient Medications  Medication Sig Dispense Refill   albuterol  (PROAIR  HFA) 108 (90 Base) MCG/ACT inhaler Inhale 2 puffs into the lungs every 6 (six) hours as needed for wheezing or shortness of breath. (Patient taking differently: Inhale 2 puffs into the lungs in the morning and at bedtime.) 18 g 2   brimonidine-timolol (COMBIGAN) 0.2-0.5 % ophthalmic solution Place 1 drop into both eyes every 12 (twelve) hours.     busPIRone  (BUSPAR ) 15 MG tablet Take 1 tablet by mouth 2 times per day (Patient taking differently: Take 15 mg by mouth in the morning.) 60 tablet 2   cariprazine  (VRAYLAR ) 1.5 MG capsule Take 2 capsules by mouth daily. (Patient taking differently: Take 1.5 mg by mouth every evening.) 60 capsule 2   cholecalciferol (VITAMIN D3) 25 MCG (1000 UNIT)  tablet Take 1,000 Units by mouth in the morning.     cloNIDine  (CATAPRES ) 0.2 MG tablet Take 1 tablet (0.2 mg total) by mouth at bedtime. (Patient taking differently: Take 0.2 mg by mouth daily in the afternoon.) 90 tablet 1   clotrimazole  (LOTRIMIN ) 1 % cream Apply 1 application topically 2 (two) times daily. (Patient taking differently: Apply 1 application  topically 2 (two) times daily as needed (skin irritation).) 45 g 1   cyclobenzaprine  (FLEXERIL ) 10 MG tablet Take 1 tablet (10 mg total) by mouth 2 (two) times daily as  needed for muscle spasms (Patient taking differently: Take 10 mg by mouth in the morning.) 90 tablet 1   DULoxetine  (CYMBALTA ) 60 MG capsule TAKE 1 CAPSULE (60 MG TOTAL) BY MOUTH DAILY. 30 capsule 6   FLUoxetine  (PROZAC ) 40 MG capsule Take 40 mg by mouth in the morning.     fluticasone -salmeterol (ADVAIR  DISKUS) 500-50 MCG/ACT AEPB Inhale 1 puff into the lungs 2 (two) times daily. 180 each 1   gabapentin  (NEURONTIN ) 300 MG capsule Take 2 capsules (600 mg total) by mouth 2 (two) times daily. (Patient taking differently: Take 300 mg by mouth in the morning.) 120 capsule 6   lactulose , encephalopathy, (CHRONULAC ) 10 GM/15ML SOLN Take 15 mLs (10 g total) by mouth 2 (two) times daily as needed for mild constipation. (Patient taking differently: Take 6.6667 g by mouth in the morning.) 946 mL 1   levocetirizine (XYZAL ) 5 MG tablet Take 1 tablet (5 mg total) by mouth every evening. (Patient taking differently: Take 5 mg by mouth in the morning.) 30 tablet 2   lidocaine  (LIDODERM ) 5 % Place 1 patch onto the skin daily. Remove & Discard patch within 12 hours or as directed by MD (Patient taking differently: Place 1 patch onto the skin daily as needed (pain.).) 30 patch 3   LINZESS 145 MCG CAPS capsule Take 1 capsule by mouth daily.     meloxicam  (MOBIC ) 7.5 MG tablet Take 1 tablet (7.5 mg total) by mouth daily as needed. (Patient taking differently: Take 7.5 mg by mouth in the morning.) 90  tablet 1   Misc. Devices MISC 4-pronged cane.  Diagnosis-spinal stenosis 1 each 0   Misc. Devices MISC Rollator walker with seat.  Diagnosis spinal stenosis 1 each 0   naloxone (NARCAN) nasal spray 4 mg/0.1 mL 1 (ONE) SPRAY BY NOSE AS NEEDED, IF FOUND UNRESPONSIVE THEN SPRAY INTO NOSE AND CALL 911     oxyCODONE -acetaminophen  (PERCOCET) 10-325 MG tablet Take 1 tablet by mouth in the morning, at noon, in the evening, and at bedtime. Takes 4 times daily     prazosin  (MINIPRESS ) 1 MG capsule Take 1 capsule by mouth at bedtime for nightmares 30 capsule 2   traZODone  (DESYREL ) 50 MG tablet TAKE 1 TABLET (50 MG TOTAL) BY MOUTH AT BEDTIME AS NEEDED. FOR SLEEP (Patient taking differently: Take 50 mg by mouth at bedtime.) 30 tablet 6   triamcinolone  cream (KENALOG ) 0.1 % Apply 1 application topically daily to arm for eczema 15 g 0   triamterene-hydrochlorothiazide (MAXZIDE-25) 37.5-25 MG tablet Take 1 tablet by mouth in the morning.     No current facility-administered medications for this visit.    REVIEW OF SYSTEMS:   Constitutional: Denies fevers, chills or abnormal night sweats Eyes: Denies blurriness of vision, double vision or watery eyes Ears, nose, mouth, throat, and face: Denies mucositis or sore throat Respiratory: Denies cough, dyspnea or wheezes Cardiovascular: Denies palpitation, chest discomfort or lower extremity swelling Gastrointestinal:  Denies nausea, heartburn or change in bowel habits Skin: Denies abnormal skin rashes Lymphatics: Denies new lymphadenopathy or easy bruising Neurological:Denies numbness, tingling or new weaknesses Behavioral/Psych: Mood is stable, no new changes  Breast: *** Denies any palpable lumps or discharge All other systems were reviewed with the patient and are negative.  PHYSICAL EXAMINATION: ECOG PERFORMANCE STATUS: {CHL ONC ECOG [phone]}  There were no vitals filed for this visit. There were no vitals filed for this visit.  GENERAL:alert, no  distress and comfortable SKIN: skin color, texture, turgor are normal, no  rashes or significant lesions EYES: normal, conjunctiva are pink and non-injected, sclera clear OROPHARYNX:no exudate, no erythema and lips, buccal mucosa, and tongue normal  NECK: supple, thyroid normal size, non-tender, without nodularity LYMPH:  no palpable lymphadenopathy in the cervical, axillary or inguinal LUNGS: clear to auscultation and percussion with normal breathing effort HEART: regular rate & rhythm and no murmurs and no lower extremity edema ABDOMEN:abdomen soft, non-tender and normal bowel sounds Musculoskeletal:no cyanosis of digits and no clubbing  PSYCH: alert & oriented x 3 with fluent speech NEURO: no focal motor/sensory deficits BREAST:*** No palpable nodules in breast. No palpable axillary or supraclavicular lymphadenopathy (exam performed in the presence of a chaperone)   LABORATORY DATA:  I have reviewed the data as listed Lab Results  Component Value Date   WBC 9.6 01/06/2024   HGB 12.1 01/06/2024   HCT 36.8 01/06/2024   MCV 79.0 (L) 01/06/2024   PLT 385 01/06/2024   Lab Results  Component Value Date   NA 138 01/06/2024   K 3.1 (L) 01/06/2024   CL 96 (L) 01/06/2024   CO2 32 01/06/2024    RADIOGRAPHIC STUDIES: I have personally reviewed the radiological reports and agreed with the findings in the report.  ASSESSMENT AND PLAN:  No problem-specific Assessment & Plan notes found for this encounter.   All questions were answered. The patient knows to call the clinic with any problems, questions or concerns.    Murleen Arms, MD 01/09/24

## 2024-01-09 NOTE — Assessment & Plan Note (Signed)
 This is a very pleasant 53 yr old female with DCIS, ER PR positive here for recommendations.  Assessment and Plan Assessment & Plan Ductal carcinoma in situ of breast Stage 0 DCIS diagnosed post-abnormal mammogram. Lumpectomy completed; awaiting hormone receptor status. Anastrozole considered if receptor positive, preferred over tamoxifen for efficacy and lower clot risk. Side effects include menopausal symptoms; bone density monitoring required. - Refer to radiation oncology for post-lumpectomy radiation therapy, four weeks, Monday through Friday. - Monitor bone density every two years on anastrozole. - Await pathology results for hormone receptor status. - Consider anastrozole post-radiation if hormone receptor positive.  Primary ovarian malignancy Left-sided primary ovarian malignancy identified via MRI, no lymphadenopathy or metastasis. Surgery scheduled. Family history of ovarian cancer noted. - Proceed with scheduled surgery for ovarian malignancy. - Consider genetic testing for BRCA1 and BRCA2 mutations. Scheduled today  Myopathy Chronic myopathy managed by pain management specialist with oxycodone .  Arthritis Chronic arthritis causing mobility issues, cane required.  Follow-up - Schedule follow-up in 6-8 weeks, post-radiation therapy. - Coordinate with nurse navigator for radiation oncology referral. - Arrange genetic testing for BRCA1 and BRCA2 mutations.

## 2024-01-09 NOTE — Op Note (Signed)
 Left Breast Radioactive seed localized lumpectomy  Indications: This patient presents with history of left breast cancer, central quadrant, high grade ductal carcinoma with necrosis, cTis, receptors- quantity not sufficient for processing  Pre-operative Diagnosis: left breast cancer  Post-operative Diagnosis: Same  Surgeon: Lockie Rima   Anesthesia: General endotracheal anesthesia  ASA Class: 3  Procedure Details  The patient was seen in the Holding Room. The risks, benefits, complications, treatment options, and expected outcomes were discussed with the patient. The possibilities of bleeding, infection, the need for additional procedures, failure to diagnose a condition, and creating a complication requiring other procedures or operations were discussed with the patient. The patient concurred with the proposed plan, giving informed consent.  The site of surgery properly noted/marked. The patient was taken to Operating Room # 2, identified, and the procedure verified as left breast seed localized lumpectomy.  The left breast and chest were prepped and draped in standard fashion. A lateral circumareolar incision was made near the previously placed radioactive seed.  Dissection was carried down around the point of maximum signal intensity. The cautery was used to perform the dissection.   The specimen was inked with the margin marker paint kit.     Specimen radiography confirmed inclusion of the mammographic lesion, the clip, and the seed.  The background signal in the breast was zero.  Additional tissue was taken at the superior, medial, and anterior margins.  Hemostasis was achieved with cautery.   The wound was irrigated and closed with 3-0 vicryl interrupted deep dermal sutures and 4-0 monocryl running subcuticular suture.      Sterile dressings were applied. At the end of the operation, all sponge, instrument, and needle counts were correct.   Findings: Seed, clip in specimen.  anterior  margin is skin   Estimated Blood Loss:  min         Specimens: left breast tissue with seed, additional medial margin, additional anterior margin, additional superior margin.          Complications:  None; patient tolerated the procedure well.         Disposition: PACU - hemodynamically stable.         Condition: stable

## 2024-01-09 NOTE — Discharge Instructions (Addendum)

## 2024-01-10 ENCOUNTER — Inpatient Hospital Stay: Payer: MEDICAID

## 2024-01-10 ENCOUNTER — Encounter (HOSPITAL_COMMUNITY): Payer: Self-pay | Admitting: General Surgery

## 2024-01-10 ENCOUNTER — Encounter: Payer: Self-pay | Admitting: *Deleted

## 2024-01-10 ENCOUNTER — Telehealth: Payer: Self-pay | Admitting: Genetic Counselor

## 2024-01-10 ENCOUNTER — Inpatient Hospital Stay: Payer: MEDICAID | Admitting: Hematology and Oncology

## 2024-01-10 ENCOUNTER — Telehealth: Payer: Self-pay | Admitting: *Deleted

## 2024-01-10 VITALS — BP 122/79 | HR 79 | Temp 97.9°F | Resp 18 | Wt 211.3 lb

## 2024-01-10 DIAGNOSIS — Z5986 Financial insecurity: Secondary | ICD-10-CM

## 2024-01-10 DIAGNOSIS — Z6839 Body mass index (BMI) 39.0-39.9, adult: Secondary | ICD-10-CM

## 2024-01-10 DIAGNOSIS — Z888 Allergy status to other drugs, medicaments and biological substances status: Secondary | ICD-10-CM

## 2024-01-10 DIAGNOSIS — C562 Malignant neoplasm of left ovary: Secondary | ICD-10-CM | POA: Diagnosis not present

## 2024-01-10 DIAGNOSIS — E669 Obesity, unspecified: Secondary | ICD-10-CM

## 2024-01-10 DIAGNOSIS — M1990 Unspecified osteoarthritis, unspecified site: Secondary | ICD-10-CM | POA: Diagnosis not present

## 2024-01-10 DIAGNOSIS — Z808 Family history of malignant neoplasm of other organs or systems: Secondary | ICD-10-CM

## 2024-01-10 DIAGNOSIS — N959 Unspecified menopausal and perimenopausal disorder: Secondary | ICD-10-CM | POA: Diagnosis not present

## 2024-01-10 DIAGNOSIS — Z87891 Personal history of nicotine dependence: Secondary | ICD-10-CM

## 2024-01-10 DIAGNOSIS — N83201 Unspecified ovarian cyst, right side: Secondary | ICD-10-CM

## 2024-01-10 DIAGNOSIS — Z8041 Family history of malignant neoplasm of ovary: Secondary | ICD-10-CM

## 2024-01-10 DIAGNOSIS — D0512 Intraductal carcinoma in situ of left breast: Secondary | ICD-10-CM

## 2024-01-10 DIAGNOSIS — Z79899 Other long term (current) drug therapy: Secondary | ICD-10-CM

## 2024-01-10 DIAGNOSIS — Z803 Family history of malignant neoplasm of breast: Secondary | ICD-10-CM

## 2024-01-10 DIAGNOSIS — Z880 Allergy status to penicillin: Secondary | ICD-10-CM

## 2024-01-10 DIAGNOSIS — Z801 Family history of malignant neoplasm of trachea, bronchus and lung: Secondary | ICD-10-CM

## 2024-01-10 DIAGNOSIS — Z83719 Family history of colon polyps, unspecified: Secondary | ICD-10-CM

## 2024-01-10 DIAGNOSIS — I10 Essential (primary) hypertension: Secondary | ICD-10-CM

## 2024-01-10 DIAGNOSIS — G8929 Other chronic pain: Secondary | ICD-10-CM

## 2024-01-10 DIAGNOSIS — Z800 Family history of malignant neoplasm of digestive organs: Secondary | ICD-10-CM

## 2024-01-10 DIAGNOSIS — Z8249 Family history of ischemic heart disease and other diseases of the circulatory system: Secondary | ICD-10-CM

## 2024-01-10 DIAGNOSIS — D573 Sickle-cell trait: Secondary | ICD-10-CM

## 2024-01-10 LAB — GENETIC SCREENING ORDER

## 2024-01-10 NOTE — Telephone Encounter (Addendum)
 Per Dr Daisey Dryer fax records and optimization form to the following offices   [facility]  [phone])

## 2024-01-10 NOTE — Telephone Encounter (Signed)
 Genetic testing--- per request of Dr Lorella Roles team, Rendell Carrel TRF placed for BRCAPlus with reflex to CancerNext-Expanded+RNAinsight Panel.  Contacted scheduling with request to add phone call genetic appt for patient early next week .

## 2024-01-13 ENCOUNTER — Inpatient Hospital Stay (HOSPITAL_BASED_OUTPATIENT_CLINIC_OR_DEPARTMENT_OTHER): Payer: MEDICAID | Admitting: Genetic Counselor

## 2024-01-13 DIAGNOSIS — Z8041 Family history of malignant neoplasm of ovary: Secondary | ICD-10-CM

## 2024-01-13 DIAGNOSIS — N9489 Other specified conditions associated with female genital organs and menstrual cycle: Secondary | ICD-10-CM | POA: Diagnosis not present

## 2024-01-13 DIAGNOSIS — Z803 Family history of malignant neoplasm of breast: Secondary | ICD-10-CM | POA: Diagnosis not present

## 2024-01-13 DIAGNOSIS — D0512 Intraductal carcinoma in situ of left breast: Secondary | ICD-10-CM

## 2024-01-14 ENCOUNTER — Encounter: Payer: Self-pay | Admitting: Genetic Counselor

## 2024-01-14 ENCOUNTER — Other Ambulatory Visit: Payer: Self-pay | Admitting: Psychiatry

## 2024-01-14 ENCOUNTER — Telehealth: Payer: Self-pay | Admitting: Radiation Oncology

## 2024-01-14 ENCOUNTER — Encounter: Payer: Self-pay | Admitting: Psychiatry

## 2024-01-14 ENCOUNTER — Ambulatory Visit (HOSPITAL_COMMUNITY)
Admission: RE | Admit: 2024-01-14 | Discharge: 2024-01-14 | Disposition: A | Discharge status: Home / Self Care | Payer: MEDICAID | Source: Ambulatory Visit | Attending: Psychiatry | Admitting: Psychiatry

## 2024-01-14 ENCOUNTER — Encounter: Payer: Self-pay | Admitting: *Deleted

## 2024-01-14 DIAGNOSIS — K769 Liver disease, unspecified: Secondary | ICD-10-CM

## 2024-01-14 DIAGNOSIS — N9489 Other specified conditions associated with female genital organs and menstrual cycle: Secondary | ICD-10-CM | POA: Insufficient documentation

## 2024-01-14 LAB — SURGICAL PATHOLOGY

## 2024-01-14 MED ORDER — IOHEXOL 9 MG/ML PO SOLN
ORAL | Status: AC
Start: 2024-01-14 — End: ?
  Filled 2024-01-14: qty 1000

## 2024-01-14 MED ORDER — IOHEXOL 9 MG/ML PO SOLN: 1000.0000 mL | ORAL | Status: AC

## 2024-01-14 MED ORDER — IOHEXOL 300 MG/ML  SOLN
100.0000 mL | Freq: Once | INTRAMUSCULAR | Status: AC | PRN
  Administered 2024-01-14: 100 mL via INTRAVENOUS

## 2024-01-14 NOTE — Telephone Encounter (Signed)
 Called patient to schedule a FUN visit w. PA Richad Champagne, patient requested all appointments after 2:00 PM. Scheduled as advised.

## 2024-01-14 NOTE — Telephone Encounter (Signed)
 Sent Deep Inspiration Breath hold document via MyChart.

## 2024-01-14 NOTE — Progress Notes (Signed)
 REFERRING PROVIDER: Murleen Arms, MD [address] [place],  [place] [address]   PRIMARY PROVIDER:  Joaquin Mulberry, MD  PRIMARY REASON FOR VISIT:  1. Ductal carcinoma in situ (DCIS) of left breast   2. Adnexal mass   3. Family history of breast cancer   4. Family history of ovarian cancer      HISTORY OF PRESENT ILLNESS:   Ms. Skillings, a 53 y.o. female, was seen for a [place] cancer genetics consultation at the request of Dr. Arno Bibles due history of personal and family history of breast cancer.  Ms. Schlarb presents to clinic today to discuss the possibility of a hereditary predisposition to cancer, to discuss genetic testing, and to further clarify her future cancer risks, as well as potential cancer risks for family members.   In March 2025, at the age of 64, Ms. 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 The treatment plan also includes adjuvant radiation and consideration for anti-estrogens.  She is also scheduled undergoing bilateral salingo-oophorectomy on May 20 due to left adnexal mass.   CANCER HISTORY:  Oncology History  Ductal carcinoma in situ (DCIS) of left breast  11/08/2023 Mammogram   In the left breast, calcifications warrant further evaluation. In the right breast, no findings suspicious for malignancy.   12/05/2023 Pathology Results   High grade DCIS, solid and clinging types with necrosis and calcs. Neg for invasive carcinoma. Insufficienct residual tumor within the deeper prognostic marker immunohistochemical stains to determine the hormone status of the tumor.   12/24/2023 Initial Diagnosis   Ductal carcinoma in situ (DCIS) of left breast   01/10/2024 Cancer Staging   Staging form: Breast, AJCC 8th Edition - Clinical stage from 01/10/2024: Stage Unknown (cTis (DCIS), cNX, ER: Unknown, PR: Unknown, HER2: Unknown) - Signed by Murleen Arms, MD on 01/10/2024 Stage prefix: Initial diagnosis    Screening/Risk  factors Patient reports most recent colonoscopy in April 2025 with one polyp.  Colonoscopy in 2022-- one sessile serrated polyp; five hyperplastic polyps.    Past Medical History:  Diagnosis Date   Allergy    pollen, pcn   Anxiety    Asthma    Bipolar disorder (HCC)    Breast cancer (HCC)    Cataract    COPD (chronic obstructive pulmonary disease) (HCC)    Depression    Fibroids    Glaucoma    History of ovarian cyst    Hypertension    Menometrorrhagia    Ovarian cancer (HCC)    Sickle cell trait (HCC)     Past Surgical History:  Procedure Laterality Date   BREAST BIOPSY Left 12/05/2023   MM LT BREAST BX W LOC DEV 1ST LESION IMAGE BX SPEC STEREO GUIDE 12/05/2023 [facility]   BREAST BIOPSY  01/08/2024   MM LT RADIOACTIVE SEED LOC MAMMO GUIDE 01/08/2024 [facility]   BREAST LUMPECTOMY WITH RADIOACTIVE SEED LOCALIZATION Left 01/09/2024   Procedure: BREAST LUMPECTOMY WITH RADIOACTIVE SEED LOCALIZATION;  Surgeon: Lockie Rima, MD;  Location: [facility] OR;  Service: General;  Laterality: Left;  LEFT BREAST SEED LOCALIZED LUMPECTOMY   CATARACT EXTRACTION, BILATERAL     CESAREAN SECTION     all three pregnancies   COLONOSCOPY  07/04/2021     FAMILY HISTORY:  We obtained a detailed, 4-generation family history.  Significant diagnoses are listed below: Family History  Problem Relation Age of Onset   Breast cancer Mother        dx 52s;  mets   Throat cancer Father        d. 43   Lung cancer Maternal Uncle        x3 mat uncles   Breast cancer Maternal Grandmother        dx >50; mets to brain   Lung cancer Maternal Grandfather    Breast cancer Paternal Grandmother 7   Lung cancer Paternal Grandfather    Ovarian cancer Other        MGM's sister     Ms. Muccio is unaware of previous family history of genetic testing for hereditary cancer risks.   There is no reported Ashkenazi Jewish ancestry. There is no known consanguinity.  GENETIC COUNSELING ASSESSMENT: Ms.  Haralson is a 53 y.o. female with a personal and family history of cancer which is somewhat suggestive of a hereditary cancer syndrome and predisposition to cancer given the presence of breast and ovarian cancer in multiple generations of the family. We, therefore, discussed and recommended the following at today's visit.   DISCUSSION: We discussed that 5 - 10% of cancer is hereditary, with most cases of hereditary breast cancer associated with mutations in BRCA1/2.  There are other genes that can be associated with hereditary breast and ovarian cancer syndromes.  Type of cancer risk and level of risk are gene-specific. We discussed that testing is beneficial for several reasons including knowing how to follow individuals after completing their treatment, identifying whether potential treatment options would be beneficial, and understanding if other family members could be at risk for cancer and allowing them to undergo genetic testing.   We reviewed the characteristics, features and inheritance patterns of hereditary cancer syndromes. We also discussed genetic testing, including the appropriate family members to test, the process of testing, insurance coverage and turn-around-time for results. We discussed the implications of a negative, positive and/or variant of uncertain significant result. In order to get genetic test results in a timely manner so that Ms. Kreutzer and her providers can use these genetic test results for surgical decisions, we recommended Ms. Brooks Cao pursue genetic testing for the [employer]  The BRCAPlus gene panel offered by [facility] and includes sequencing and rearrangement analysis for the following 13 genes: ATM, BARD1, BRCA1, BRCA2, CDH1, CHEK2, NF1, PALB2, PTEN, RAD51C, RAD51D, STK11 and TP53. Once complete, we recommend Ms. Legault pursue reflex genetic testing to a more comprehensive gene panel.   Ms. Cockburn  was offered a common hereditary cancer panel (~40 genes) and an  expanded pan-cancer panel (~70 genes). Ms. Drummey was informed of the benefits and limitations of each panel, including that expanded pan-cancer panels contain genes that do not have clear management guidelines at this point in time.  We also discussed that as the number of genes included on a panel increases, the chances of variants of uncertain significance increases.  After considering the benefits and limitations of each gene panel, Ms. Pedone  elected to have an expanded Psychologist, occupational through [employer]  The CancerNext-Expanded gene panel offered by [facility] and includes sequencing, rearrangement, and RNA analysis for the following 77 genes: AIP, ALK, APC, ATM, AXIN2, BAP1, BARD1, BMPR1A, BRCA1, BRCA2, BRIP1, CDC73, CDH1, CDK4, CDKN1B, CDKN2A, CEBPA, CHEK2, CTNNA1, DDX41, DICER1, ETV6, FH, FLCN, GATA2, LZTR1, MAX, MBD4, MEN1, MET, MLH1, MSH2, MSH3, MSH6, MUTYH, NF1, NF2, NTHL1, PALB2, PHOX2B, PMS2, POT1, PRKAR1A, PTCH1, PTEN, RAD51C, RAD51D, RB1, RET, RUNX1, RSP20, SDHA, SDHAF2, SDHB, SDHC, SDHD, SMAD4, SMARCA4, SMARCB1, SMARCE1, STK11, SUFU, TMEM127, TP53, TSC1, TSC2, VHL, and  WT1 (sequencing and deletion/duplication); EGFR, HOXB13, KIT, MITF, PDGFRA, POLD1, and POLE (sequencing only); EPCAM and GREM1 (deletion/duplication only).    Based on Ms. Jantz's personal history of breast cancer and family history of breast cancer in at least two close relatives, she meets NCCN medical criteria for genetic testing. She is the most informative relative available for genetic testing.   PLAN: After considering the risks, benefits, and limitations, Ms. Brooks Cao provided informed consent to pursue genetic testing.  The blood sample was collected on 4/25 and sent to [facility] for analysis of the [facility] BRCAPlus and CancerNext-Expanded +RNAinsight Panel. Results should be available within approximately 1-2 weeks' time, at which point they will be disclosed by telephone to Ms. Mccathern, as will any  additional recommendations warranted by these results. Ms. Kocsis will receive a summary of her genetic counseling visit and a copy of her results once available. This information will also be available in Epic.   Ms. Ewin questions were answered to her satisfaction today. Our contact information was provided should additional questions or concerns arise. Thank you for the referral and allowing us  to share in the care of your patient.   Zoejane Gaulin M. Ora Billing, MS, [facility] Genetic Counselor [email or web address] (P) [phone]   35 minutes were spent on the date of the encounter in service to the patient including preparation, audio-only consultation, documentation and care coordination.  The patient was accompanied by her daughter.  Drs. Iruku, Gudena and/or Maryalice Smaller were available to discuss this case as needed.   _______________________________________________________________________ For Office Staff:  Number of people involved in session: 2 Was an Intern/ student involved with case: no

## 2024-01-15 ENCOUNTER — Telehealth: Payer: Self-pay

## 2024-01-15 NOTE — Telephone Encounter (Signed)
 Per Dr.Newton, MRI is scheduled for 5/9@ 9:30 at [facility].  LVM for patient to call office regarding MRI appointment

## 2024-01-15 NOTE — Telephone Encounter (Signed)
 Pt returned call. She agrees to date/time of MRI appointment

## 2024-01-15 NOTE — Telephone Encounter (Signed)
-----   Message from [place], [place] sent at 01/14/2024  6:09 PM EDT ----- Please schedule for MRI prior to upcoming surgery.

## 2024-01-21 ENCOUNTER — Encounter: Payer: Self-pay | Admitting: Family Medicine

## 2024-01-21 ENCOUNTER — Other Ambulatory Visit: Payer: Self-pay

## 2024-01-21 ENCOUNTER — Ambulatory Visit: Payer: MEDICAID | Attending: Family Medicine | Admitting: Family Medicine

## 2024-01-21 VITALS — BP 123/85 | HR 80 | Ht 61.0 in | Wt 209.2 lb

## 2024-01-21 DIAGNOSIS — J4520 Mild intermittent asthma, uncomplicated: Secondary | ICD-10-CM | POA: Diagnosis not present

## 2024-01-21 DIAGNOSIS — D0512 Intraductal carcinoma in situ of left breast: Secondary | ICD-10-CM

## 2024-01-21 DIAGNOSIS — N951 Menopausal and female climacteric states: Secondary | ICD-10-CM | POA: Diagnosis not present

## 2024-01-21 DIAGNOSIS — F419 Anxiety disorder, unspecified: Secondary | ICD-10-CM

## 2024-01-21 DIAGNOSIS — N9489 Other specified conditions associated with female genital organs and menstrual cycle: Secondary | ICD-10-CM

## 2024-01-21 DIAGNOSIS — M48062 Spinal stenosis, lumbar region with neurogenic claudication: Secondary | ICD-10-CM | POA: Diagnosis not present

## 2024-01-21 DIAGNOSIS — F32A Depression, unspecified: Secondary | ICD-10-CM

## 2024-01-21 DIAGNOSIS — M5010 Cervical disc disorder with radiculopathy, unspecified cervical region: Secondary | ICD-10-CM

## 2024-01-21 MED ORDER — CLONIDINE HCL 0.2 MG PO TABS: 0.2000 mg | ORAL_TABLET | Freq: Every day | ORAL | 1 refills | Status: DC

## 2024-01-21 MED ORDER — CYCLOBENZAPRINE HCL 10 MG PO TABS: 10.0000 mg | ORAL_TABLET | Freq: Two times a day (BID) | ORAL | 1 refills | Status: DC

## 2024-01-21 MED ORDER — FLUTICASONE-SALMETEROL 500-50 MCG/ACT IN AEPB: 1.0000 | INHALATION_SPRAY | Freq: Two times a day (BID) | RESPIRATORY_TRACT | 1 refills | Status: DC

## 2024-01-21 MED ORDER — GABAPENTIN 300 MG PO CAPS: 600.0000 mg | ORAL_CAPSULE | Freq: Two times a day (BID) | ORAL | 6 refills | Status: DC

## 2024-01-21 MED ORDER — ALBUTEROL SULFATE HFA 108 (90 BASE) MCG/ACT IN AERS: 2.0000 | INHALATION_SPRAY | Freq: Four times a day (QID) | RESPIRATORY_TRACT | 2 refills | Status: DC | PRN

## 2024-01-21 NOTE — Patient Instructions (Addendum)
 VISIT SUMMARY:  [name], a 53 year old female with breast and ovarian cancer, visited for a regular check-up and medication management. She recently had breast surgery and is scheduled for ovarian cancer surgery in two weeks. She experiences significant lower back pain and has various other health concerns including anxiety, depression, asthma, menopausal symptoms, liver lesions, and calcium deposits on her feet.  YOUR PLAN: -BREAST CANCER, STAGE 0: Stage 0 breast cancer, also known as ductal carcinoma in situ, is a non-invasive cancer where abnormal cells are found in the lining of a breast duct. You have completed surgery and will proceed with radiation therapy post-hysterectomy, followed by a five-year course of oral medication.  -CHRONIC PAIN: Chronic pain is long-standing pain that persists beyond the usual recovery period. You will continue taking oxycodone -acetaminophen  as needed, and we will evaluate the effectiveness of your pain management regimen.  -ANXIETY: Anxiety is a feeling of worry or fear that can be managed with medication and therapy. You will consult with behavioral health to review and possibly adjust your anxiety medications to avoid taking too many medications.  -DEPRESSION: Depression is a mood disorder characterized by persistent feelings of sadness and loss of interest. You will consult with behavioral health to review and possibly adjust your depression medications.  -ASTHMA: Asthma is a condition in which your airways narrow and swell, producing extra mucus. You will continue using your albuterol  inhaler as needed.  -VASOMOTOR MENOPAUSAL SYMPTOMS: These symptoms include hot flashes and night sweats related to menopause. You will continue taking clonidine  at bedtime and reassess the need for it after your hysterectomy.  -LIVER LESIONS, POSSIBLY BENIGN: Liver lesions are abnormal growths in the liver that are often benign. You will proceed with the scheduled MRI to  evaluate these lesions.  -CALCIUM DEPOSITS ON FEET: Calcium deposits can cause pain and discomfort in the feet. You are advised to use topical arthritis cream for pain management.  INSTRUCTIONS:  Proceed with your scheduled ovarian cancer surgery in two weeks. Follow up with radiation therapy post-surgery. Consult with behavioral health to review your anxiety and depression medications. Continue using your albuterol  inhaler as needed and clonidine  at bedtime. Use topical arthritis cream for your foot pain. Attend your scheduled MRI for liver lesion evaluation.

## 2024-01-21 NOTE — Progress Notes (Signed)
 Subjective:  Patient ID: [name], female    DOB: August 03, 1971  Age: 53 y.o. MRN: [id number]  CC: Medical Management of Chronic Issues (Bumps on both feet/Discuss recent cancer diagnosis/Handicap form/paperwork)     Discussed the use of AI scribe software for clinical note transcription with the patient, who gave verbal consent to proceed.  History of Present Illness [name] is a 53 year old female with a history of asthma, hypertension, depression, chronic low back pain secondary to spinal stenosis Pain is managed by [facility], degenerative disc disease of the thoracic spine, cervical stenosis, hot flashes, and chronic constipation, newly diagnosed stage 0 DCIS, ovarian mass presents for a regular visit and medication management. She is accompanied by her oldest daughter.  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 She takes multiple medications including albuterol  as needed, clonidine  at bedtime for hot flashes, gabapentin , trazodone , and duloxetine . She uses Flonase nasal spray for seasonal allergies.  Her bipolar disorder is managed by behavioral health and on her med list she has listed Cymbalta , Prozac , trazodone , hydroxyzine , BuSpar , Vraylar  and is unsure of what she takes exactly but she states she has been out of buspirone .    She experiences significant lower back pain radiating to her side and takes oxycodone  four times a day for pain management.      Past Medical History:  Diagnosis Date   Allergy    pollen, pcn   Anxiety    Asthma    Bipolar disorder (HCC)    Breast cancer (HCC)    Cataract    COPD (chronic obstructive pulmonary disease) (HCC)    Depression    Fibroids    Glaucoma    History of  ovarian cyst    Hypertension    Menometrorrhagia    Ovarian cancer (HCC)    Sickle cell trait (HCC)     Past Surgical History:  Procedure Laterality Date   BREAST BIOPSY Left 12/05/2023   MM LT BREAST BX W LOC DEV 1ST LESION IMAGE BX SPEC STEREO GUIDE 12/05/2023 [facility]   BREAST BIOPSY  01/08/2024   MM LT RADIOACTIVE SEED LOC MAMMO GUIDE 01/08/2024 [facility]   BREAST LUMPECTOMY WITH RADIOACTIVE SEED LOCALIZATION Left 01/09/2024   Procedure: BREAST LUMPECTOMY WITH RADIOACTIVE SEED LOCALIZATION;  Surgeon: Lockie Rima, MD;  Location: [facility] OR;  Service: General;  Laterality: Left;  LEFT BREAST SEED LOCALIZED LUMPECTOMY   CATARACT EXTRACTION, BILATERAL     CESAREAN SECTION     all three pregnancies   COLONOSCOPY  07/04/2021    Family History  Problem Relation Age of Onset   Breast cancer Mother        dx 88s; mets   Hypertension Mother    Heart disease Father    Throat cancer Father        d. 74   Lung cancer Maternal Uncle        x3 mat uncles   Breast cancer Maternal Grandmother        dx >50; mets to brain   Lung cancer Maternal Grandfather    Breast cancer Paternal Grandmother 69   Lung cancer Paternal Grandfather    Ovarian cancer Other        MGM's sister   Other Neg Hx  Esophageal cancer Neg Hx    Rectal cancer Neg Hx    Stomach cancer Neg Hx    Prostate cancer Neg Hx    Pancreatic cancer Neg Hx    Endometrial cancer Neg Hx     Social History   Socioeconomic History   Marital status: Divorced    Spouse name: Not on file   Number of children: 3   Years of education: Not on file   Highest education level: 12th grade  Occupational History   Not on file  Tobacco Use   Smoking status: Former    Current packs/day: 0.00    Types: Cigarettes    Quit date: 02/14/2016    Years since quitting: 7.9   Smokeless tobacco: Never   Tobacco comments:    3 cigarettes daily 06-22-21  Vaping Use   Vaping status: Never Used  Substance and Sexual Activity    Alcohol  use: Not Currently    Comment: socially   Drug use: No   Sexual activity: Yes    Partners: Male    Birth control/protection: None  Other Topics Concern   Not on file  Social History Narrative   Not on file   Social Drivers of Health   Financial Resource Strain: Medium Risk (01/17/2024)   Overall Financial Resource Strain (CARDIA)    Difficulty of Paying Living Expenses: Somewhat hard  Food Insecurity: No Food Insecurity (01/17/2024)   Hunger Vital Sign    Worried About Running Out of Food in the Last Year: Never true    Ran Out of Food in the Last Year: Never true  Transportation Needs: No Transportation Needs (01/17/2024)   PRAPARE - Administrator, Civil Service (Medical): No    Lack of Transportation (Non-Medical): No  Physical Activity: Insufficiently Active (01/17/2024)   Exercise Vital Sign    Days of Exercise per Week: 2 days    Minutes of Exercise per Session: 10 min  Stress: Stress Concern Present (01/17/2024)   [employer] of Occupational Health - Occupational Stress Questionnaire    Feeling of Stress : To some extent  Social Connections: Moderately Integrated (01/17/2024)   Social Connection and Isolation Panel [NHANES]    Frequency of Communication with Friends and Family: Twice a week    Frequency of Social Gatherings with Friends and Family: Once a week    Attends Religious Services: 1 to 4 times per year    Active Member of [employer] or Organizations: Patient declined    Attends Banker Meetings: 1 to 4 times per year    Marital Status: Divorced    Allergies  Allergen Reactions   Bupropion Hcl Er (Sr) Hives and Other (See Comments)    The patient has an entire page of reactions that can be caused by the medication, but none that she has suffered.   Fruit & Vegetable Daily [Nutritional Supplements] Other (See Comments)    "all fruits causes bumps and weird feeling in her mouth"   Penicillins Hives   Pollen Extract-Tree Extract  Other (See Comments)    Allergies   Other Rash    "bugs" roaches    Outpatient Medications Prior to Visit  Medication Sig Dispense Refill   brimonidine-timolol (COMBIGAN) 0.2-0.5 % ophthalmic solution Place 1 drop into both eyes every 12 (twelve) hours.     busPIRone  (BUSPAR ) 15 MG tablet Take 1 tablet by mouth 2 times per day (Patient taking differently: Take 15 mg by mouth in the morning.) 60 tablet 2  cariprazine  (VRAYLAR ) 1.5 MG capsule Take 2 capsules by mouth daily. (Patient taking differently: Take 1.5 mg by mouth every evening.) 60 capsule 2   cholecalciferol (VITAMIN D3) 25 MCG (1000 UNIT) tablet Take 1,000 Units by mouth in the morning.     DULoxetine  (CYMBALTA ) 60 MG capsule TAKE 1 CAPSULE (60 MG TOTAL) BY MOUTH DAILY. 30 capsule 6   FLUoxetine  (PROZAC ) 40 MG capsule Take 40 mg by mouth in the morning.     lactulose , encephalopathy, (CHRONULAC ) 10 GM/15ML SOLN Take 15 mLs (10 g total) by mouth 2 (two) times daily as needed for mild constipation. (Patient taking differently: Take 6.6667 g by mouth in the morning.) 946 mL 1   levocetirizine (XYZAL ) 5 MG tablet Take 1 tablet (5 mg total) by mouth every evening. (Patient taking differently: Take 5 mg by mouth in the morning.) 30 tablet 2   lidocaine  (LIDODERM ) 5 % Place 1 patch onto the skin daily. Remove & Discard patch within 12 hours or as directed by MD (Patient taking differently: Place 1 patch onto the skin daily as needed (pain.).) 30 patch 3   LINZESS 145 MCG CAPS capsule Take 145 mcg by mouth daily.     meloxicam  (MOBIC ) 7.5 MG tablet Take 1 tablet (7.5 mg total) by mouth daily as needed. (Patient taking differently: Take 7.5 mg by mouth in the morning.) 90 tablet 1   Misc. Devices MISC 4-pronged cane.  Diagnosis-spinal stenosis 1 each 0   Misc. Devices MISC Rollator walker with seat.  Diagnosis spinal stenosis 1 each 0   naloxone (NARCAN) nasal spray 4 mg/0.1 mL 1 (ONE) SPRAY BY NOSE AS NEEDED, IF FOUND UNRESPONSIVE THEN SPRAY  INTO NOSE AND CALL 911     oxyCODONE  (OXY IR/ROXICODONE ) 5 MG immediate release tablet Take 1 tablet (5 mg total) by mouth every 6 (six) hours as needed for severe pain (pain score 7-10). 5 tablet 0   oxyCODONE -acetaminophen  (PERCOCET) 10-325 MG tablet Take 1 tablet by mouth in the morning, at noon, in the evening, and at bedtime.     prazosin  (MINIPRESS ) 1 MG capsule Take 1 capsule by mouth at bedtime for nightmares 30 capsule 2   traZODone  (DESYREL ) 50 MG tablet TAKE 1 TABLET (50 MG TOTAL) BY MOUTH AT BEDTIME AS NEEDED. FOR SLEEP (Patient taking differently: Take 50 mg by mouth at bedtime.) 30 tablet 6   triamcinolone  cream (KENALOG ) 0.1 % Apply 1 application topically daily to arm for eczema 15 g 0   triamterene-hydrochlorothiazide (MAXZIDE-25) 37.5-25 MG tablet Take 1 tablet by mouth in the morning.     albuterol  (PROAIR  HFA) 108 (90 Base) MCG/ACT inhaler Inhale 2 puffs into the lungs every 6 (six) hours as needed for wheezing or shortness of breath. (Patient taking differently: Inhale 2 puffs into the lungs in the morning and at bedtime.) 18 g 2   cloNIDine  (CATAPRES ) 0.2 MG tablet Take 1 tablet (0.2 mg total) by mouth at bedtime. (Patient taking differently: Take 0.2 mg by mouth daily in the afternoon.) 90 tablet 1   cyclobenzaprine  (FLEXERIL ) 10 MG tablet Take 1 tablet (10 mg total) by mouth 2 (two) times daily as needed for muscle spasms (Patient taking differently: Take 10 mg by mouth in the morning.) 90 tablet 1   fluticasone -salmeterol (ADVAIR  DISKUS) 500-50 MCG/ACT AEPB Inhale 1 puff into the lungs 2 (two) times daily. 180 each 1   gabapentin  (NEURONTIN ) 300 MG capsule Take 2 capsules (600 mg total) by mouth 2 (two) times daily. (Patient  taking differently: Take 300 mg by mouth in the morning.) 120 capsule 6   No facility-administered medications prior to visit.     ROS Review of Systems  Constitutional:  Negative for activity change and appetite change.  HENT:  Negative for sinus  pressure and sore throat.   Respiratory:  Negative for chest tightness, shortness of breath and wheezing.   Cardiovascular:  Negative for chest pain and palpitations.  Gastrointestinal:  Negative for abdominal distention, abdominal pain and constipation.  Genitourinary: Negative.   Musculoskeletal:  Positive for back pain.  Psychiatric/Behavioral:  Negative for behavioral problems and dysphoric mood.     Objective:  BP 123/85   Pulse 80   Ht 5\' 1"  (1.549 m)   Wt 209 lb 3.2 oz (94.9 kg)   LMP 03/28/2016   SpO2 100%   BMI 39.53 kg/m      01/21/2024    2:26 PM 01/10/2024   10:21 AM 01/09/2024    2:30 PM  BP/Weight  Systolic BP 123 122 133  Diastolic BP 85 79 94  Wt. (Lbs) 209.2 211.31   BMI 39.53 kg/m2 39.93 kg/m2       Physical Exam Constitutional:      Appearance: She is well-developed.  Cardiovascular:     Rate and Rhythm: Normal rate.     Heart sounds: Normal heart sounds. No murmur heard. Pulmonary:     Effort: Pulmonary effort is normal.     Breath sounds: Normal breath sounds. No wheezing or rales.  Chest:     Chest wall: No tenderness.  Abdominal:     General: Bowel sounds are normal. There is no distension.     Palpations: Abdomen is soft. There is no mass.     Tenderness: There is no abdominal tenderness.  Musculoskeletal:        General: Normal range of motion.     Right lower leg: No edema.     Left lower leg: No edema.  Neurological:     Mental Status: She is alert and oriented to person, place, and time.  Psychiatric:        Mood and Affect: Mood normal.        Latest Ref Rng & Units 01/06/2024    3:00 PM 07/24/2023    2:40 PM 04/25/2022    9:56 PM  CMP  Glucose 70 - 99 mg/dL 469  87  629   BUN 6 - 20 mg/dL 13  8  13    Creatinine 0.44 - 1.00 mg/dL 5.28  4.13  2.44   Sodium 135 - 145 mmol/L 138  144  144   Potassium 3.5 - 5.1 mmol/L 3.1  4.3  4.0   Chloride 98 - 111 mmol/L 96  104  109   CO2 22 - 32 mmol/L 32  28  28   Calcium 8.9 - 10.3 mg/dL  9.0  9.2  9.2   Total Protein 6.0 - 8.5 g/dL  6.9    Total Bilirubin 0.0 - 1.2 mg/dL  0.4    Alkaline Phos 44 - 121 IU/L  96    AST 0 - 40 IU/L  14    ALT 0 - 32 IU/L  7      Lipid Panel     Component Value Date/Time   CHOL 166 01/31/2022 1428   TRIG 72 01/31/2022 1428   HDL 50 01/31/2022 1428   CHOLHDL 4.3 02/09/2021 1024   CHOLHDL 3.0 05/15/2016 0949   VLDL 13 05/15/2016 0949  LDLCALC 102 (H) 01/31/2022 1428    CBC    Component Value Date/Time   WBC 9.6 01/06/2024 1500   RBC 4.66 01/06/2024 1500   HGB 12.1 01/06/2024 1500   HGB 11.5 01/31/2022 1428   HCT 36.8 01/06/2024 1500   HCT 35.5 01/31/2022 1428   PLT 385 01/06/2024 1500   PLT 344 01/31/2022 1428   MCV 79.0 (L) 01/06/2024 1500   MCV 78 (L) 01/31/2022 1428   MCH 26.0 01/06/2024 1500   MCHC 32.9 01/06/2024 1500   RDW 13.1 01/06/2024 1500   RDW 12.9 01/31/2022 1428   LYMPHSABS 2.0 01/31/2022 1428   MONOABS 0.4 06/26/2010 2057   EOSABS 0.1 01/31/2022 1428   BASOSABS 0.0 01/31/2022 1428    Lab Results  Component Value Date   HGBA1C 5.1 07/24/2023       Assessment & Plan  Left adnexal mass Suspicious for primary ovarian malignancy Bilateral salpingo-oophorectomy planned  - Proceed with scheduled surgery in two weeks.   Breast cancer, stage 0 Ductal carcinoma in situ status postlumpectomy. Radiation therapy planned post-hysterectomy, followed by oral medication if indicated. - Proceed with planned radiation therapy post surgery - Follow-up with oncology  Chronic pain Chronic lower back pain with inadequate management on current regimen. - Continue oxycodone -acetaminophen  as needed prescribed by pain managent and advised to speak with pain management physician about inadequately controlled pain   Bipolar disorder, Anxiety and depression Managed with multiple medications. Behavioral health consultation advised to avoid polypharmacy. - Consult behavioral health to review and possibly adjust anxiety  medication regimen. - Discuss with behavioral health the necessity of each medication to avoid polypharmacy as she is currently on 2 SSRIs Cymbalta  and Prozac  and is also on Vraylar , trazodone , BuSpar , hydroxyzine  and she does not need to be on all these medications.   Asthma Asthma managed with albuterol  inhaler. No acute exacerbations. - Continue albuterol  inhaler as needed.  Vasomotor menopausal symptoms Symptoms managed with clonidine .  - Continue clonidine  at bedtime. - Reassess need for clonidine  post-hysterectomy.        Meds ordered this encounter  Medications   albuterol  (PROAIR  HFA) 108 (90 Base) MCG/ACT inhaler    Sig: Inhale 2 puffs into the lungs every 6 (six) hours as needed for wheezing or shortness of breath.    Dispense:  18 g    Refill:  2   cloNIDine  (CATAPRES ) 0.2 MG tablet    Sig: Take 1 tablet (0.2 mg total) by mouth at bedtime.    Dispense:  90 tablet    Refill:  1   cyclobenzaprine  (FLEXERIL ) 10 MG tablet    Sig: Take 1 tablet (10 mg total) by mouth 2 (two) times daily as needed for muscle spasms    Dispense:  90 tablet    Refill:  1   fluticasone -salmeterol (ADVAIR  DISKUS) 500-50 MCG/ACT AEPB    Sig: Inhale 1 puff into the lungs 2 (two) times daily.    Dispense:  180 each    Refill:  1   gabapentin  (NEURONTIN ) 300 MG capsule    Sig: Take 2 capsules (600 mg total) by mouth 2 (two) times daily.    Dispense:  120 capsule    Refill:  6    Follow-up: Return in about 6 months (around 07/23/2024) for Chronic medical conditions.       Joaquin Mulberry, MD, FAAFP. [facility] [place], [place] [phone]   01/21/2024, 5:42 PM

## 2024-01-24 ENCOUNTER — Ambulatory Visit (HOSPITAL_COMMUNITY)
Admission: RE | Admit: 2024-01-24 | Discharge: 2024-01-24 | Disposition: A | Discharge status: Home / Self Care | Payer: MEDICAID | Source: Ambulatory Visit | Attending: Psychiatry | Admitting: Psychiatry

## 2024-01-24 DIAGNOSIS — K769 Liver disease, unspecified: Secondary | ICD-10-CM | POA: Diagnosis present

## 2024-01-24 MED ORDER — GADOBUTROL 1 MMOL/ML IV SOLN
9.0000 mL | Freq: Once | INTRAVENOUS | Status: AC | PRN
  Administered 2024-01-24: 9 mL via INTRAVENOUS

## 2024-01-27 ENCOUNTER — Inpatient Hospital Stay: Payer: MEDICAID | Attending: Licensed Clinical Social Worker | Admitting: Gynecologic Oncology

## 2024-01-27 ENCOUNTER — Telehealth: Payer: Self-pay | Admitting: *Deleted

## 2024-01-27 ENCOUNTER — Encounter: Payer: Self-pay | Admitting: Psychiatry

## 2024-01-27 ENCOUNTER — Encounter (HOSPITAL_COMMUNITY)
Admission: RE | Admit: 2024-01-27 | Discharge: 2024-01-27 | Disposition: A | Discharge status: Home / Self Care | Payer: MEDICAID | Source: Ambulatory Visit | Attending: Psychiatry | Admitting: Psychiatry

## 2024-01-27 ENCOUNTER — Other Ambulatory Visit: Payer: Self-pay

## 2024-01-27 ENCOUNTER — Encounter: Payer: Self-pay | Admitting: Genetic Counselor

## 2024-01-27 ENCOUNTER — Telehealth: Payer: Self-pay | Admitting: Genetic Counselor

## 2024-01-27 ENCOUNTER — Encounter (HOSPITAL_COMMUNITY): Payer: Self-pay

## 2024-01-27 VITALS — BP 137/92 | HR 77 | Temp 98.2°F | Resp 16 | Ht 61.0 in | Wt 207.2 lb

## 2024-01-27 VITALS — BP 130/88 | HR 77 | Temp 98.5°F | Resp 18 | Ht 61.0 in | Wt 207.8 lb

## 2024-01-27 DIAGNOSIS — Z01818 Encounter for other preprocedural examination: Secondary | ICD-10-CM | POA: Diagnosis present

## 2024-01-27 DIAGNOSIS — Z01812 Encounter for preprocedural laboratory examination: Secondary | ICD-10-CM | POA: Insufficient documentation

## 2024-01-27 DIAGNOSIS — N9489 Other specified conditions associated with female genital organs and menstrual cycle: Secondary | ICD-10-CM | POA: Diagnosis not present

## 2024-01-27 DIAGNOSIS — Z1379 Encounter for other screening for genetic and chromosomal anomalies: Secondary | ICD-10-CM | POA: Insufficient documentation

## 2024-01-27 HISTORY — DX: Unspecified osteoarthritis, unspecified site: M19.90

## 2024-01-27 LAB — CBC
HCT: 38.3 % (ref 36.0–46.0)
Hemoglobin: 12.2 g/dL (ref 12.0–15.0)
MCH: 25.6 pg — ABNORMAL LOW (ref 26.0–34.0)
MCHC: 31.9 g/dL (ref 30.0–36.0)
MCV: 80.3 fL (ref 80.0–100.0)
Platelets: 381 10*3/uL (ref 150–400)
RBC: 4.77 MIL/uL (ref 3.87–5.11)
RDW: 13.2 % (ref 11.5–15.5)
WBC: 9.6 10*3/uL (ref 4.0–10.5)
nRBC: 0 % (ref 0.0–0.2)

## 2024-01-27 LAB — COMPREHENSIVE METABOLIC PANEL WITH GFR
ALT: 20 U/L (ref 0–44)
AST: 22 U/L (ref 15–41)
Albumin: 3.8 g/dL (ref 3.5–5.0)
Alkaline Phosphatase: 74 U/L (ref 38–126)
Anion gap: 11 (ref 5–15)
BUN: 16 mg/dL (ref 6–20)
CO2: 31 mmol/L (ref 22–32)
Calcium: 9.7 mg/dL (ref 8.9–10.3)
Chloride: 98 mmol/L (ref 98–111)
Creatinine, Ser: 1.11 mg/dL — ABNORMAL HIGH (ref 0.44–1.00)
GFR, Estimated: 59 mL/min — ABNORMAL LOW (ref >60)
Glucose, Bld: 94 mg/dL (ref 70–99)
Potassium: 3.5 mmol/L (ref 3.5–5.1)
Sodium: 140 mmol/L (ref 135–145)
Total Bilirubin: 1 mg/dL (ref 0.0–1.2)
Total Protein: 7.6 g/dL (ref 6.5–8.1)

## 2024-01-27 NOTE — Telephone Encounter (Signed)
 Spoke with Ms. Rancourt and relayed message from Vira Grieves, NP that pt's kidney function is slightly elevated at 1.11. At the end of April, it was at 1.20 so this has improved. Advised patient to stay well hydrated and avoid use of NSAIDs on a regular basis and only use Mobic  if absolutely needed. Pt verbalized understanding and thanked the office for calling. Lab results faxed to PCP Dr. Newlin at [phone].

## 2024-01-27 NOTE — Patient Instructions (Signed)
 SURGICAL WAITING ROOM VISITATION  Patients having surgery or a procedure may have no more than 2 support people in the waiting area - these visitors may rotate.    Children under the age of 70 must have an adult with them who is not the patient.  Due to an increase in RSV and influenza rates and associated hospitalizations, children ages 66 and under may not visit patients in [facility] hospitals.  Visitors with respiratory illnesses are discouraged from visiting and should remain at home.  If the patient needs to stay at the hospital during part of their recovery, the visitor guidelines for inpatient rooms apply. Pre-op nurse will coordinate an appropriate time for 1 support person to accompany patient in pre-op.  This support person may not rotate.    Please refer to the [facility] website for the visitor guidelines for Inpatients (after your surgery is over and you are in a regular room).       Your procedure is scheduled on:  02/04/2024    Report to [facility] Main Entrance    Report to admitting at  0945 AM   Call this number if you have problems the morning of surgery [phone]   Do not eat food :After Midnight.            Eat a light diet the day before surgery.  Avoid gas producing foods and carbonated beverages.    After Midnight you may have the following liquids until _ 0845am   DAY OF SURGERY  Water Non-Citrus Juices (without pulp, NO RED-Apple, White grape, White cranberry) Black Coffee (NO MILK/CREAM OR CREAMERS, sugar ok)  Clear Tea (NO MILK/CREAM OR CREAMERS, sugar ok) regular and decaf                             Plain Jell-O (NO RED)                                           Fruit ices (not with fruit pulp, NO RED)                                     Popsicles (NO RED)                                                               Sports drinks like Gatorade (NO RED)                           If you have questions, please contact your surgeon's  office.       Oral Hygiene is also important to reduce your risk of infection.                                    Remember - BRUSH YOUR TEETH THE MORNING OF SURGERY WITH YOUR REGULAR TOOTHPASTE  DENTURES WILL BE REMOVED PRIOR TO SURGERY PLEASE DO NOT APPLY "Poly grip" OR ADHESIVES!!!  Do NOT smoke after Midnight   Stop all vitamins and herbal supplements 7 days before surgery.   Take these medicines the morning of surgery with A SIP OF WATER:  inhalers as usual and bring, buspar , cymbalta , prozAc , gabapentin    DO NOT TAKE ANY ORAL DIABETIC MEDICATIONS DAY OF YOUR SURGERY  Bring CPAP mask and tubing day of surgery.                              You may not have any metal on your body including hair pins, jewelry, and body piercing             Do not wear make-up, lotions, powders, perfumes/cologne, or deodorant  Do not wear nail polish including gel and S&S, artificial/acrylic nails, or any other type of covering on natural nails including finger and toenails. If you have artificial nails, gel coating, etc. that needs to be removed by a nail salon please have this removed prior to surgery or surgery may need to be canceled/ delayed if the surgeon/ anesthesia feels like they are unable to be safely monitored.   Do not shave  48 hours prior to surgery.               Men may shave face and neck.   Do not bring valuables to the hospital. [facility] IS NOT             RESPONSIBLE   FOR VALUABLES.   Contacts, glasses, dentures or bridgework may not be worn into surgery.   Bring small overnight bag day of surgery.   DO NOT BRING YOUR HOME MEDICATIONS TO THE HOSPITAL. PHARMACY WILL DISPENSE MEDICATIONS LISTED ON YOUR MEDICATION LIST TO YOU DURING YOUR ADMISSION IN THE HOSPITAL!    Patients discharged on the day of surgery will not be allowed to drive home.  Someone NEEDS to stay with you for the first 24 hours after anesthesia.   Special Instructions: Bring a copy of your healthcare  power of attorney and living will documents the day of surgery if you haven't scanned them before.              Please read over the following fact sheets you were given: IF YOU HAVE QUESTIONS ABOUT YOUR PRE-OP INSTRUCTIONS PLEASE CALL [phone]   If you received a COVID test during your pre-op visit  it is requested that you wear a mask when out in public, stay away from anyone that may not be feeling well and notify your surgeon if you develop symptoms. If you test positive for Covid or have been in contact with anyone that has tested positive in the last 10 days please notify you surgeon.    [facility] - Preparing for Surgery Before surgery, you can play an important role.  Because skin is not sterile, your skin needs to be as free of germs as possible.  You can reduce the number of germs on your skin by washing with CHG (chlorahexidine gluconate) soap before surgery.  CHG is an antiseptic cleaner which kills germs and bonds with the skin to continue killing germs even after washing. Please DO NOT use if you have an allergy to CHG or antibacterial soaps.  If your skin becomes reddened/irritated stop using the CHG and inform your nurse when you arrive at Short Stay. Do not shave (including legs and underarms) for at least 48 hours prior to the first CHG shower.  You may shave your face/neck. Please follow these instructions carefully:  1.  Shower with CHG Soap the night before surgery and the  morning of Surgery.  2.  If you choose to wash your hair, wash your hair first as usual with your  normal  shampoo.  3.  After you shampoo, rinse your hair and body thoroughly to remove the  shampoo.                           4.  Use CHG as you would any other liquid soap.  You can apply chg directly  to the skin and wash                       Gently with a scrungie or clean washcloth.  5.  Apply the CHG Soap to your body ONLY FROM THE NECK DOWN.   Do not use on face/ open                           Wound  or open sores. Avoid contact with eyes, ears mouth and genitals (private parts).                       Wash face,  Genitals (private parts) with your normal soap.             6.  Wash thoroughly, paying special attention to the area where your surgery  will be performed.  7.  Thoroughly rinse your body with warm water from the neck down.  8.  DO NOT shower/wash with your normal soap after using and rinsing off  the CHG Soap.                9.  Pat yourself dry with a clean towel.            10.  Wear clean pajamas.            11.  Place clean sheets on your bed the night of your first shower and do not  sleep with pets. Day of Surgery : Do not apply any lotions/deodorants the morning of surgery.  Please wear clean clothes to the hospital/surgery center.  FAILURE TO FOLLOW THESE INSTRUCTIONS MAY RESULT IN THE CANCELLATION OF YOUR SURGERY PATIENT SIGNATURE_________________________________  NURSE SIGNATURE__________________________________  ________________________________________________________________________

## 2024-01-27 NOTE — Telephone Encounter (Signed)
 Called [name] to discuss results of genetic testing (negative).  Said she was unable to talk at the moment and requested a call back later today/tomorrow.

## 2024-01-27 NOTE — Progress Notes (Addendum)
 Patient here for a pre-operative appointment prior to her scheduled surgery on 02/04/2024. She is scheduled for robotic assisted laparoscopic bilateral salpingo-oophorectomy, possible robotic assisted total hysterectomy, possible staging if a precancer or cancer is seen, possible laparotomy.  She has her pre-admission testing appointment today at [facility].  The surgery was discussed in detail.  See after visit summary for additional details.    Discussed post-op pain management in detail including the aspects of the enhanced recovery pathway. Per pt her chronic pain prescriber stated our office would be managing her pain immediately post-op. She will also plan on continuing her bowel regimen of linzess and lactulose .  Discussed measures to take at home to prevent DVT including frequent mobility.  Reportable signs and symptoms of DVT discussed. Post-operative instructions discussed and expectations for after surgery. Incisional care discussed as well including reportable signs and symptoms including erythema, drainage, wound separation.     10 minutes spent preparing information and with the patient.  Verbalizing understanding of material discussed. No needs or concerns voiced at the end of the visit.   Advised patient to call for any needs. Post-op meds will be sent in closer to the surgery date.   This appointment is included in the global surgical bundle as pre-operative teaching and has no charge.

## 2024-01-27 NOTE — Progress Notes (Addendum)
 Anesthesia Review:  PCP: Enobong Newling LOV 01/21/24  Cardiologist : none   PPM/ ICD: Device Orders: Rep Notified:  Chest x-ray : CT chest- 01/14/24  EKG : 01/06/24  Echo : Stress test: Cardiac Cath :   Activity level: can do a flight of stairs without difficutly  Sleep Study/ CPAP : none  Fasting Blood Sugar :      / Checks Blood Sugar -- times a day:    Blood Thinner/ Instructions /Last Dose: ASA / Instructions/ Last Dose :    Research spoke with pt at preop appt.

## 2024-01-27 NOTE — Patient Instructions (Signed)
 Preparing for your Surgery  Plan for surgery on Feb 04, 2024 with Dr. Derrel Flies at [facility]. You will be scheduled for robotic assisted laparoscopic bilateral salpingo-oophorectomy (removal of the ovaries and fallopian tubes), possible robotic assisted total hysterectomy (removal of the uterus and cervix), possible staging if a precancer or cancer is seen, possible laparotomy (larger incision on the abdomen if needed).   Pre-operative Testing -(Done, today at 2 pm) You will receive a phone call from presurgical testing at [facility] to arrange for a pre-operative appointment and lab work.  -Bring your insurance card, copy of an advanced directive if applicable, medication list  -At that visit, you will be asked to sign a consent for a possible blood transfusion in case a transfusion becomes necessary during surgery.  The need for a blood transfusion is rare but having consent is a necessary part of your care.     -You should not be taking blood thinners or aspirin at least ten days prior to surgery unless instructed by your surgeon.  -Do not take supplements such as fish oil (omega 3), red yeast rice, turmeric before your surgery. STOP TAKING AT LEAST 10 DAYS BEFORE SURGERY. You want to avoid medications with aspirin in them including headache powders such as BC or Goody's), Excedrin migraine.  Day Before Surgery at Home -You will be asked to take in a light diet the day before surgery. You will be advised you can have clear liquids up until 3 hours before your surgery.    Eat a light diet the day before surgery.  Examples including soups, broths, toast, yogurt, mashed potatoes.  AVOID GAS PRODUCING FOODS AND BEVERAGES. Things to avoid include carbonated beverages (fizzy beverages, sodas), raw fruits and raw vegetables (uncooked), or beans.   If your bowels are filled with gas, your surgeon will have difficulty visualizing your pelvic organs which increases your  surgical risks.  Your role in recovery Your role is to become active as soon as directed by your doctor, while still giving yourself time to heal.  Rest when you feel tired. You will be asked to do the following in order to speed your recovery:  - Cough and breathe deeply. This helps to clear and expand your lungs and can prevent pneumonia after surgery.  - STAY ACTIVE WHEN YOU GET HOME. Do mild physical activity. Walking or moving your legs help your circulation and body functions return to normal. Do not try to get up or walk alone the first time after surgery.   -If you develop swelling on one leg or the other, pain in the back of your leg, redness/warmth in one of your legs, please call the office or go to the Emergency Room to have a doppler to rule out a blood clot. For shortness of breath, chest pain-seek care in the Emergency Room as soon as possible. - Actively manage your pain. Managing your pain lets you move in comfort. We will ask you to rate your pain on a scale of zero to 10. It is your responsibility to tell your doctor or nurse where and how much you hurt so your pain can be treated.  Special Considerations -If you are diabetic, you may be placed on insulin after surgery to have closer control over your blood sugars to promote healing and recovery.  This does not mean that you will be discharged on insulin.  If applicable, your oral antidiabetics will be resumed when you are tolerating a solid diet.  -  Your final pathology results from surgery should be available around one week after surgery and the results will be relayed to you when available.  -Dr. Abdul Hodgkin is the surgeon that assists your GYN Oncologist with surgery.  If you end up staying the night, the next day after your surgery you will either see Dr. Orvil Bland, Dr. Daisey Dryer, or Dr. Abdul Hodgkin.  -FMLA forms can be faxed to [phone] and please allow 5-7 business days for completion.  Pain Management After  Surgery -Make sure that you have Tylenol  and Ibuprofen  IF YOU ARE ABLE TO TAKE THESE MEDICATIONS at home to use on a regular basis after surgery for pain control. We recommend alternating the medications every hour to six hours since they work differently and are processed in the body differently for pain relief. You will continue your current pain regimen.  -Review the attached handout on narcotic use and their risks and side effects.   Bowel Regimen -You can continue taking your linzess and lactulose  to prevent constipation.  Risks of Surgery Risks of surgery are low but include bleeding, infection, damage to surrounding structures, re-operation, blood clots, and very rarely death.   Blood Transfusion Information (For the consent to be signed before surgery)  We will be checking your blood type before surgery so in case of emergencies, we will know what type of blood you would need.                                            WHAT IS A BLOOD TRANSFUSION?  A transfusion is the replacement of blood or some of its parts. Blood is made up of multiple cells which provide different functions. Red blood cells carry oxygen and are used for blood loss replacement. White blood cells fight against infection. Platelets control bleeding. Plasma helps clot blood. Other blood products are available for specialized needs, such as hemophilia or other clotting disorders. BEFORE THE TRANSFUSION  Who gives blood for transfusions?  You may be able to donate blood to be used at a later date on yourself (autologous donation). Relatives can be asked to donate blood. This is generally not any safer than if you have received blood from a stranger. The same precautions are taken to ensure safety when a relative's blood is donated. Healthy volunteers who are fully evaluated to make sure their blood is safe. This is blood bank blood. Transfusion therapy is the safest it has ever been in the practice of medicine.  Before blood is taken from a donor, a complete history is taken to make sure that person has no history of diseases nor engages in risky social behavior (examples are intravenous drug use or sexual activity with multiple partners). The donor's travel history is screened to minimize risk of transmitting infections, such as malaria. The donated blood is tested for signs of infectious diseases, such as HIV and hepatitis. The blood is then tested to be sure it is compatible with you in order to minimize the chance of a transfusion reaction. If you or a relative donates blood, this is often done in anticipation of surgery and is not appropriate for emergency situations. It takes many days to process the donated blood. RISKS AND COMPLICATIONS Although transfusion therapy is very safe and saves many lives, the main dangers of transfusion include:  Getting an infectious disease. Developing a transfusion reaction. This is an  allergic reaction to something in the blood you were given. Every precaution is taken to prevent this. The decision to have a blood transfusion has been considered carefully by your caregiver before blood is given. Blood is not given unless the benefits outweigh the risks.  AFTER SURGERY INSTRUCTIONS  Return to work: 4-6 weeks if applicable  Activity: 1. Be up and out of the bed during the day.  Take a nap if needed.  You may walk up steps but be careful and use the hand rail.  Stair climbing will tire you more than you think, you may need to stop part way and rest.   2. No lifting or straining for 6 weeks over 10 pounds. No pushing, pulling, straining for 6 weeks.  3. No driving for 1-61 days when the following criteria have been met: Do not drive if you are taking narcotic pain medicine and make sure that your reaction time has returned.   4. You can shower as soon as the next day after surgery. Shower daily.  Use your regular soap and water (not directly on the incision) and pat your  incision(s) dry afterwards; don't rub.  No tub baths or submerging your body in water until cleared by your surgeon. If you have the soap that was given to you by pre-surgical testing that was used before surgery, you do not need to use it afterwards because this can irritate your incisions.   5. No sexual activity and nothing in the vagina for 4-6 weeks, 12 weeks if you have a hysterectomy (removal of the uterus and cervix).  6. You may experience a small amount of clear drainage from your incisions, which is normal.  If the drainage persists, increases, or changes color please call the office.  7. Do not use creams, lotions, or ointments such as neosporin on your incisions after surgery until advised by your surgeon because they can cause removal of the dermabond glue on your incisions.    8. You may experience vaginal spotting after surgery or when the stitches at the top of the vagina begin to dissolve (if you have a hysterectomy).  The spotting is normal but if you experience heavy bleeding, call our office.  9. Take Tylenol  or ibuprofen  first for pain if you are able to take these medications.  Monitor your Tylenol  intake to a max of 4,000 mg in a 24 hour period. You can alternate these medications after surgery.  Diet: 1. Low sodium Heart Healthy Diet is recommended but you are cleared to resume your normal (before surgery) diet after your procedure.  2. Continue on your current bowel regimen.  Wound Care: 1. Keep clean and dry.  Shower daily.  Reasons to call the Doctor: Fever - Oral temperature greater than 100.4 degrees Fahrenheit Foul-smelling vaginal discharge Difficulty urinating Nausea and vomiting Increased pain at the site of the incision that is unrelieved with pain medicine. Difficulty breathing with or without chest pain New calf pain especially if only on one side Sudden, continuing increased vaginal bleeding with or without clots.   Contacts: For questions or  concerns you should contact:  Dr. Derrel Flies at [phone]  Vira Grieves, NP at [phone]  After Hours: call [phone] and have the GYN Oncologist paged/contacted (after 5 pm or on the weekends). You will speak with an after hours RN and let he or she know you have had surgery.  Messages sent via mychart are for non-urgent matters and are not responded to after hours  so for urgent needs, please call the after hours number.

## 2024-01-27 NOTE — Telephone Encounter (Signed)
-----   Message from Suellyn Emory sent at 01/27/2024  3:49 PM EDT ----- Please call the patient and let her know her kidney function is slightly elevated at 1.11. At the end of April, it was at 1.20 so this has improved. Would make sure she is staying hydrated. Avoid use of NSAIDs on a reg basis.   Bernis Brisker, please fax results to patient's PCP as well. Thanks

## 2024-01-27 NOTE — Telephone Encounter (Signed)
 Disclosed negative genetics.

## 2024-01-28 NOTE — Anesthesia Preprocedure Evaluation (Addendum)
 Anesthesia Evaluation  Patient identified by MRN, date of birth, ID band Patient awake    Reviewed: Allergy & Precautions, H&P , NPO status , Patient's Chart, lab work & pertinent test results  Airway Mallampati: II  TM Distance: >3 FB Neck ROM: Full    Dental no notable dental hx. (+) Edentulous Upper, Edentulous Lower, Dental Advisory Given   Pulmonary asthma , former smoker   Pulmonary exam normal breath sounds clear to auscultation       Cardiovascular hypertension, Pt. on medications  Rhythm:Regular Rate:Normal     Neuro/Psych   Anxiety Depression Bipolar Disorder   negative neurological ROS     GI/Hepatic negative GI ROS, Neg liver ROS,,,  Endo/Other    Class 3 obesity  Renal/GU negative Renal ROS  negative genitourinary   Musculoskeletal  (+) Arthritis , Osteoarthritis,    Abdominal   Peds  Hematology negative hematology ROS (+)   Anesthesia Other Findings   Reproductive/Obstetrics negative OB ROS                             Anesthesia Physical Anesthesia Plan  ASA: 3  Anesthesia Plan: General   Post-op Pain Management: Tylenol  PO (pre-op)*   Induction: Intravenous  PONV Risk Score and Plan: 4 or greater and Ondansetron , Dexamethasone  and Midazolam   Airway Management Planned: Oral ETT  Additional Equipment:   Intra-op Plan:   Post-operative Plan: Extubation in OR  Informed Consent: I have reviewed the patients History and Physical, chart, labs and discussed the procedure including the risks, benefits and alternatives for the proposed anesthesia with the patient or authorized representative who has indicated his/her understanding and acceptance.     Dental advisory given  Plan Discussed with: CRNA  Anesthesia Plan Comments: (Reviewed. 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 Labs and EKG WNL)        Anesthesia Quick Evaluation

## 2024-01-30 ENCOUNTER — Encounter: Payer: Self-pay | Admitting: Genetic Counselor

## 2024-01-30 ENCOUNTER — Ambulatory Visit: Payer: Self-pay | Admitting: Genetic Counselor

## 2024-01-30 DIAGNOSIS — Z1379 Encounter for other screening for genetic and chromosomal anomalies: Secondary | ICD-10-CM

## 2024-01-30 NOTE — Progress Notes (Signed)
 HPI:   [name] was previously seen in the [facility] due to a personal and family history of cancer and concerns regarding a hereditary predisposition to cancer. Please refer to our prior cancer genetics clinic note for more information regarding our discussion, assessment and recommendations, at the time. [name] recent genetic test results were disclosed to her by her genetic counselor, Kerin Pebbles, as were recommendations warranted by these results. These results and recommendations are discussed in more detail below.  CANCER HISTORY:  Oncology History  Ductal carcinoma in situ (DCIS) of left breast  11/08/2023 Mammogram   In the left breast, calcifications warrant further evaluation. In the right breast, no findings suspicious for malignancy.   12/05/2023 Pathology Results   High grade DCIS, solid and clinging types with necrosis and calcs. Neg for invasive carcinoma. Insufficienct residual tumor within the deeper prognostic marker immunohistochemical stains to determine the hormone status of the tumor.   12/24/2023 Initial Diagnosis   Ductal carcinoma in situ (DCIS) of left breast   01/10/2024 Cancer Staging   Staging form: Breast, AJCC 8th Edition - Clinical stage from 01/10/2024: Stage Unknown (cTis (DCIS), cNX, ER: Unknown, PR: Unknown, HER2: Unknown) - Signed by Murleen Arms, MD on 01/10/2024 Stage prefix: Initial diagnosis   01/23/2024 Genetic Testing   Negative [facility] CancerNext-Expanded +RNAinsight Panel.  Report date is 01/23/2024.  The CancerNext-Expanded gene panel offered by [facility] and includes sequencing, rearrangement, and RNA analysis for the following 77 genes: AIP, ALK, APC, ATM, AXIN2, BAP1, BARD1, BMPR1A, BRCA1, BRCA2, BRIP1, CDC73, CDH1, CDK4, CDKN1B, CDKN2A, CEBPA, CHEK2, CTNNA1, DDX41, DICER1, ETV6, FH, FLCN, GATA2, LZTR1, MAX, MBD4, MEN1, MET, MLH1, MSH2, MSH3, MSH6, MUTYH, NF1, NF2, NTHL1, PALB2, PHOX2B, PMS2, POT1, PRKAR1A, PTCH1,  PTEN, RAD51C, RAD51D, RB1, RET, RUNX1, RSP20, SDHA, SDHAF2, SDHB, SDHC, SDHD, SMAD4, SMARCA4, SMARCB1, SMARCE1, STK11, SUFU, TMEM127, TP53, TSC1, TSC2, VHL, and WT1 (sequencing and deletion/duplication); EGFR, HOXB13, KIT, MITF, PDGFRA, POLD1, and POLE (sequencing only); EPCAM and GREM1 (deletion/duplication only).      FAMILY HISTORY:  We obtained a detailed, 4-generation family history.  Significant diagnoses are listed below:      Family History  Problem Relation Age of Onset   Breast cancer Mother          dx 105s; mets   Throat cancer Father          d. 34   Lung cancer Maternal Uncle          x3 mat uncles   Breast cancer Maternal Grandmother          dx >50; mets to brain   Lung cancer Maternal Grandfather     Breast cancer Paternal Grandmother 81   Lung cancer Paternal Grandfather     Ovarian cancer Other          MGM's sister       [name] is unaware of previous family history of genetic testing for hereditary cancer risks.    There is no reported Ashkenazi Jewish ancestry. There is no known consanguinity.  GENETIC TEST RESULTS:  The [facility] CancerNext-Expanded Panel found no pathogenic mutations.   The CancerNext-Expanded gene panel offered by [facility] and includes sequencing, rearrangement, and RNA analysis for the following 77 genes: AIP, ALK, APC, ATM, AXIN2, BAP1, BARD1, BMPR1A, BRCA1, BRCA2, BRIP1, CDC73, CDH1, CDK4, CDKN1B, CDKN2A, CEBPA, CHEK2, CTNNA1, DDX41, DICER1, ETV6, FH, FLCN, GATA2, LZTR1, MAX, MBD4, MEN1, MET, MLH1, MSH2, MSH3, MSH6, MUTYH, NF1, NF2, NTHL1, PALB2, PHOX2B,  PMS2, POT1, PRKAR1A, PTCH1, PTEN, RAD51C, RAD51D, RB1, RET, RPS20, RUNX1, SDHA, SDHAF2, SDHB, SDHC, SDHD, SMAD4, SMARCA4, SMARCB1, SMARCE1, STK11, SUFU, TMEM127, TP53, TSC1, TSC2, VHL, and WT1 (sequencing and deletion/duplication); EGFR, HOXB13, KIT, MITF, PDGFRA, POLD1, and POLE (sequencing only); EPCAM and GREM1 (deletion/duplication only).     The test report has been scanned into  EPIC and is located under the Molecular Pathology section of the Results Review tab.  A portion of the result report is included below for reference. Genetic testing reported out on 01/23/2024.       Even though a pathogenic variant was not identified, possible explanations for the cancer in the family may include: There may be no hereditary risk for cancer in the family. The cancers in [name] and/or her family may be due to other genetic or environmental factors. There may be a gene mutation in one of these genes that current testing methods cannot detect, but that chance is small. There could be another gene that has not yet been discovered, or that we have not yet tested, that is responsible for the cancer diagnoses in the family.  It is also possible there is a hereditary cause for the cancer in the family that [name] did not inherit.  Therefore, it is important to remain in touch with cancer genetics in the future so that we can continue to offer [name] the most up to date genetic testing.   ADDITIONAL GENETIC TESTING:  We discussed with [name] that her genetic testing was fairly extensive.  If there are genes identified to increase cancer risk that can be analyzed in the future, we would be happy to discuss and coordinate this testing at that time.    CANCER SCREENING RECOMMENDATIONS:  [name] test result is considered negative (normal).  This means that we have not identified a hereditary cause for her personal and family history of cancer at this time.   An individual's cancer risk and medical management are not determined by genetic test results alone. Overall cancer risk assessment incorporates additional factors, including personal medical history, family history, and any available genetic information that may result in a personalized plan for cancer prevention and surveillance. Therefore, it is recommended she continue to follow the cancer management and screening  guidelines provided by her oncology and primary healthcare provider.  RECOMMENDATIONS FOR FAMILY MEMBERS:   Since she did not inherit a mutation in a cancer predisposition gene included on this panel, her children could not have inherited a mutation from her in one of these genes. Individuals in this family might be at some increased risk of developing breast cancer, over the general population risk, due to the family history of breast cancer. We recommend women in this family have a yearly mammogram beginning at age 58, or 44 years younger than the earliest onset of cancer, an annual clinical breast exam, and perform monthly breast self-exams.  Other members of the family may still carry a pathogenic variant in one of these genes that [name] did not inherit. Based on the family history, we recommend her sister and maternal relatives have genetic counseling and testing.   FOLLOW-UP:  Cancer genetics is a rapidly advancing field and it is possible that new genetic tests will be appropriate for her and/or her family members in the future. We encouraged her to remain in contact with cancer genetics on an annual basis so we can update her personal and family histories and let her know of advances  in cancer genetics that may benefit this family.   Our contact number was provided. [name] questions were answered to her satisfaction, and she knows she is welcome to call us  at anytime with additional questions or concerns.   Burk Hoctor, MS, [facility] Genetic Counselor [email or web address] (P) [phone]

## 2024-01-31 ENCOUNTER — Other Ambulatory Visit: Payer: MEDICAID

## 2024-01-31 ENCOUNTER — Encounter: Payer: MEDICAID | Admitting: Genetic Counselor

## 2024-02-03 ENCOUNTER — Other Ambulatory Visit: Payer: Self-pay | Admitting: Gynecologic Oncology

## 2024-02-03 ENCOUNTER — Telehealth: Payer: Self-pay | Admitting: *Deleted

## 2024-02-03 NOTE — Telephone Encounter (Signed)
 Telephone call to check on pre-operative status.  Patient compliant with pre-operative instructions.  Reinforced nothing to eat after midnight. Clear liquids until 0840. Patient to arrive at 0940.  No questions or concerns voiced.  Instructed to call for any needs.

## 2024-02-04 ENCOUNTER — Other Ambulatory Visit: Payer: Self-pay

## 2024-02-04 ENCOUNTER — Encounter (HOSPITAL_COMMUNITY): Payer: Self-pay | Admitting: Psychiatry

## 2024-02-04 ENCOUNTER — Encounter (HOSPITAL_COMMUNITY)
Admission: RE | Disposition: A | Discharge status: Home / Self Care | Payer: Self-pay | Source: Ambulatory Visit | Attending: Psychiatry

## 2024-02-04 ENCOUNTER — Ambulatory Visit (HOSPITAL_COMMUNITY)
Admission: RE | Admit: 2024-02-04 | Discharge: 2024-02-04 | Disposition: A | Discharge status: Home / Self Care | Payer: MEDICAID | Source: Ambulatory Visit | Attending: Psychiatry | Admitting: Psychiatry

## 2024-02-04 ENCOUNTER — Ambulatory Visit (HOSPITAL_COMMUNITY): Payer: MEDICAID | Admitting: Medical

## 2024-02-04 ENCOUNTER — Ambulatory Visit (HOSPITAL_BASED_OUTPATIENT_CLINIC_OR_DEPARTMENT_OTHER): Payer: MEDICAID | Admitting: Certified Registered"

## 2024-02-04 DIAGNOSIS — G8929 Other chronic pain: Secondary | ICD-10-CM | POA: Insufficient documentation

## 2024-02-04 DIAGNOSIS — Z79899 Other long term (current) drug therapy: Secondary | ICD-10-CM | POA: Insufficient documentation

## 2024-02-04 DIAGNOSIS — H409 Unspecified glaucoma: Secondary | ICD-10-CM | POA: Insufficient documentation

## 2024-02-04 DIAGNOSIS — F319 Bipolar disorder, unspecified: Secondary | ICD-10-CM | POA: Diagnosis not present

## 2024-02-04 DIAGNOSIS — Z87891 Personal history of nicotine dependence: Secondary | ICD-10-CM | POA: Insufficient documentation

## 2024-02-04 DIAGNOSIS — K660 Peritoneal adhesions (postprocedural) (postinfection): Secondary | ICD-10-CM | POA: Diagnosis not present

## 2024-02-04 DIAGNOSIS — E66813 Obesity, class 3: Secondary | ICD-10-CM | POA: Insufficient documentation

## 2024-02-04 DIAGNOSIS — I10 Essential (primary) hypertension: Secondary | ICD-10-CM | POA: Insufficient documentation

## 2024-02-04 DIAGNOSIS — J45909 Unspecified asthma, uncomplicated: Secondary | ICD-10-CM | POA: Diagnosis not present

## 2024-02-04 DIAGNOSIS — Z01818 Encounter for other preprocedural examination: Secondary | ICD-10-CM

## 2024-02-04 DIAGNOSIS — M1990 Unspecified osteoarthritis, unspecified site: Secondary | ICD-10-CM | POA: Insufficient documentation

## 2024-02-04 DIAGNOSIS — D270 Benign neoplasm of right ovary: Secondary | ICD-10-CM | POA: Diagnosis present

## 2024-02-04 DIAGNOSIS — D271 Benign neoplasm of left ovary: Secondary | ICD-10-CM

## 2024-02-04 DIAGNOSIS — Z853 Personal history of malignant neoplasm of breast: Secondary | ICD-10-CM | POA: Insufficient documentation

## 2024-02-04 DIAGNOSIS — N839 Noninflammatory disorder of ovary, fallopian tube and broad ligament, unspecified: Secondary | ICD-10-CM | POA: Diagnosis not present

## 2024-02-04 DIAGNOSIS — F418 Other specified anxiety disorders: Secondary | ICD-10-CM | POA: Insufficient documentation

## 2024-02-04 DIAGNOSIS — Z6839 Body mass index (BMI) 39.0-39.9, adult: Secondary | ICD-10-CM | POA: Diagnosis not present

## 2024-02-04 DIAGNOSIS — N9489 Other specified conditions associated with female genital organs and menstrual cycle: Secondary | ICD-10-CM

## 2024-02-04 HISTORY — PX: ROBOTIC ASSISTED BILATERAL SALPINGO OOPHERECTOMY: SHX6078

## 2024-02-04 LAB — ABO/RH: ABO/RH(D): O POS

## 2024-02-04 LAB — TYPE AND SCREEN
ABO/RH(D): O POS
Antibody Screen: NEGATIVE

## 2024-02-04 SURGERY — SALPINGO-OOPHORECTOMY, BILATERAL, ROBOT-ASSISTED
Anesthesia: General | Laterality: Bilateral

## 2024-02-04 MED ORDER — HYDROMORPHONE HCL 1 MG/ML IJ SOLN
0.2500 mg | INTRAMUSCULAR | Status: DC | PRN
  Administered 2024-02-04 (×3): 0.5 mg via INTRAVENOUS

## 2024-02-04 MED ORDER — PHENYLEPHRINE HCL (PRESSORS) 10 MG/ML IV SOLN
INTRAVENOUS | Status: DC | PRN
  Administered 2024-02-04: 80 ug via INTRAVENOUS

## 2024-02-04 MED ORDER — ROCURONIUM BROMIDE 10 MG/ML (PF) SYRINGE
PREFILLED_SYRINGE | INTRAVENOUS | Status: AC
  Filled 2024-02-04: qty 10

## 2024-02-04 MED ORDER — HYDROMORPHONE HCL 1 MG/ML IJ SOLN
INTRAMUSCULAR | Status: AC
  Filled 2024-02-04: qty 1

## 2024-02-04 MED ORDER — ORAL CARE MOUTH RINSE: 15.0000 mL | Freq: Once | OROMUCOSAL | Status: AC

## 2024-02-04 MED ORDER — DEXAMETHASONE SODIUM PHOSPHATE 10 MG/ML IJ SOLN
4.0000 mg | INTRAMUSCULAR | Status: AC
  Administered 2024-02-04: 5 mg via INTRAVENOUS

## 2024-02-04 MED ORDER — ACETAMINOPHEN 500 MG PO TABS
1000.0000 mg | ORAL_TABLET | ORAL | Status: AC
  Administered 2024-02-04: 1000 mg via ORAL
  Filled 2024-02-04: qty 2

## 2024-02-04 MED ORDER — CHLORHEXIDINE GLUCONATE 0.12 % MT SOLN
15.0000 mL | Freq: Once | OROMUCOSAL | Status: AC
  Administered 2024-02-04: 15 mL via OROMUCOSAL

## 2024-02-04 MED ORDER — LIDOCAINE HCL (CARDIAC) PF 100 MG/5ML IV SOSY
PREFILLED_SYRINGE | INTRAVENOUS | Status: DC | PRN
  Administered 2024-02-04: 60 mg via INTRAVENOUS

## 2024-02-04 MED ORDER — MIDAZOLAM HCL 2 MG/2ML IJ SOLN
INTRAMUSCULAR | Status: AC
  Filled 2024-02-04: qty 2

## 2024-02-04 MED ORDER — ONDANSETRON HCL 4 MG/2ML IJ SOLN
INTRAMUSCULAR | Status: DC | PRN
  Administered 2024-02-04: 4 mg via INTRAVENOUS

## 2024-02-04 MED ORDER — HYDROMORPHONE HCL 2 MG/ML IJ SOLN
INTRAMUSCULAR | Status: AC
  Filled 2024-02-04: qty 1

## 2024-02-04 MED ORDER — ROCURONIUM BROMIDE 100 MG/10ML IV SOLN
INTRAVENOUS | Status: DC | PRN
  Administered 2024-02-04: 70 mg via INTRAVENOUS

## 2024-02-04 MED ORDER — HEPARIN SODIUM (PORCINE) 5000 UNIT/ML IJ SOLN
5000.0000 [IU] | INTRAMUSCULAR | Status: AC
Start: 2024-02-04 — End: 2024-02-04
  Administered 2024-02-04: 5000 [IU] via SUBCUTANEOUS
  Filled 2024-02-04: qty 1

## 2024-02-04 MED ORDER — PHENYLEPHRINE 80 MCG/ML (10ML) SYRINGE FOR IV PUSH (FOR BLOOD PRESSURE SUPPORT)
PREFILLED_SYRINGE | INTRAVENOUS | Status: AC
Start: 2024-02-04 — End: ?
  Filled 2024-02-04: qty 10

## 2024-02-04 MED ORDER — LACTATED RINGERS IV SOLN: INTRAVENOUS | Status: DC

## 2024-02-04 MED ORDER — CLINDAMYCIN PHOSPHATE 900 MG/50ML IV SOLN
900.0000 mg | INTRAVENOUS | Status: AC
Start: 2024-02-04 — End: 2024-02-04
  Administered 2024-02-04: 900 mg via INTRAVENOUS
  Filled 2024-02-04: qty 50

## 2024-02-04 MED ORDER — SCOPOLAMINE 1 MG/3DAYS TD PT72
1.0000 | MEDICATED_PATCH | TRANSDERMAL | Status: DC
  Administered 2024-02-04: 1.5 mg via TRANSDERMAL
  Filled 2024-02-04: qty 1

## 2024-02-04 MED ORDER — LACTATED RINGERS IR SOLN
Status: DC | PRN
  Administered 2024-02-04: 1000 mL

## 2024-02-04 MED ORDER — BUPIVACAINE HCL 0.25 % IJ SOLN
INTRAMUSCULAR | Status: DC | PRN
  Administered 2024-02-04: 20 mL

## 2024-02-04 MED ORDER — PROPOFOL 10 MG/ML IV BOLUS
INTRAVENOUS | Status: DC | PRN
  Administered 2024-02-04: 150 mg via INTRAVENOUS

## 2024-02-04 MED ORDER — SODIUM CHLORIDE (PF) 0.9 % IJ SOLN
INTRAMUSCULAR | Status: DC | PRN
  Administered 2024-02-04: 20 mL

## 2024-02-04 MED ORDER — SODIUM CHLORIDE (PF) 0.9 % IJ SOLN
INTRAMUSCULAR | Status: AC
  Filled 2024-02-04: qty 20

## 2024-02-04 MED ORDER — SUGAMMADEX SODIUM 200 MG/2ML IV SOLN
INTRAVENOUS | Status: DC | PRN
  Administered 2024-02-04: 200 mg via INTRAVENOUS

## 2024-02-04 MED ORDER — MIDAZOLAM HCL 5 MG/5ML IJ SOLN
INTRAMUSCULAR | Status: DC | PRN
  Administered 2024-02-04: 2 mg via INTRAVENOUS

## 2024-02-04 MED ORDER — STERILE WATER FOR INJECTION IJ SOLN
INTRAMUSCULAR | Status: AC
  Filled 2024-02-04: qty 20

## 2024-02-04 MED ORDER — STERILE WATER FOR IRRIGATION IR SOLN
Status: DC | PRN
  Administered 2024-02-04: 1000 mL

## 2024-02-04 MED ORDER — PROPOFOL 10 MG/ML IV BOLUS
INTRAVENOUS | Status: AC
  Filled 2024-02-04: qty 20

## 2024-02-04 MED ORDER — FENTANYL CITRATE (PF) 100 MCG/2ML IJ SOLN
INTRAMUSCULAR | Status: DC | PRN
Start: 2024-02-04 — End: 2024-02-04
  Administered 2024-02-04 (×5): 50 ug via INTRAVENOUS

## 2024-02-04 MED ORDER — GENTAMICIN SULFATE 40 MG/ML IJ SOLN
5.0000 mg/kg | INTRAVENOUS | Status: AC
  Administered 2024-02-04: 330 mg via INTRAVENOUS
  Filled 2024-02-04: qty 8.25

## 2024-02-04 MED ORDER — ONDANSETRON HCL 4 MG/2ML IJ SOLN
INTRAMUSCULAR | Status: AC
  Filled 2024-02-04: qty 2

## 2024-02-04 MED ORDER — BUPIVACAINE LIPOSOME 1.3 % IJ SUSP
INTRAMUSCULAR | Status: AC
  Filled 2024-02-04: qty 20

## 2024-02-04 MED ORDER — BUPIVACAINE-EPINEPHRINE (PF) 0.25% -1:200000 IJ SOLN
INTRAMUSCULAR | Status: AC
  Filled 2024-02-04: qty 30

## 2024-02-04 MED ORDER — SODIUM CHLORIDE 0.9 % IV SOLN: INTRAVENOUS | Status: DC | PRN

## 2024-02-04 MED ORDER — BUPIVACAINE LIPOSOME 1.3 % IJ SUSP
INTRAMUSCULAR | Status: DC | PRN
  Administered 2024-02-04: 20 mL

## 2024-02-04 MED ORDER — FENTANYL CITRATE (PF) 250 MCG/5ML IJ SOLN
INTRAMUSCULAR | Status: AC
  Filled 2024-02-04: qty 5

## 2024-02-04 MED ORDER — BUPIVACAINE HCL (PF) 0.25 % IJ SOLN
INTRAMUSCULAR | Status: AC
  Filled 2024-02-04: qty 30

## 2024-02-04 MED ORDER — DEXAMETHASONE SODIUM PHOSPHATE 10 MG/ML IJ SOLN
INTRAMUSCULAR | Status: AC
  Filled 2024-02-04: qty 1

## 2024-02-04 MED ORDER — LIDOCAINE HCL (PF) 2 % IJ SOLN
INTRAMUSCULAR | Status: AC
  Filled 2024-02-04: qty 5

## 2024-02-04 SURGICAL SUPPLY — 66 items
APPLICATOR SURGIFLO ENDO (HEMOSTASIS) IMPLANT
BAG LAPAROSCOPIC 12 15 PORT 16 (BASKET) IMPLANT
BLADE SURG SZ10 CARB STEEL (BLADE) ×1 IMPLANT
COVER BACK TABLE 60X90IN (DRAPES) ×3 IMPLANT
COVER TIP SHEARS 8 DVNC (MISCELLANEOUS) ×3 IMPLANT
DERMABOND ADVANCED .7 DNX12 (GAUZE/BANDAGES/DRESSINGS) ×3 IMPLANT
DRAPE ARM DVNC X/XI (DISPOSABLE) ×12 IMPLANT
DRAPE COLUMN DVNC XI (DISPOSABLE) ×3 IMPLANT
DRAPE SHEET LG 3/4 BI-LAMINATE (DRAPES) ×3 IMPLANT
DRAPE SURG IRRIG POUCH 19X23 (DRAPES) ×3 IMPLANT
DRIVER NDL MEGA SUTCUT DVNCXI (INSTRUMENTS) ×2 IMPLANT
DRIVER NDLE MEGA SUTCUT DVNCXI (INSTRUMENTS) ×2 IMPLANT
DRSG OPSITE POSTOP 4X6 (GAUZE/BANDAGES/DRESSINGS) ×1 IMPLANT
DRSG OPSITE POSTOP 4X8 (GAUZE/BANDAGES/DRESSINGS) IMPLANT
ELECT PENCIL ROCKER SW 15FT (MISCELLANEOUS) ×1 IMPLANT
ELECT REM PT RETURN 15FT ADLT (MISCELLANEOUS) ×3 IMPLANT
FORCEPS BPLR FENES DVNC XI (FORCEP) ×3 IMPLANT
FORCEPS PROGRASP DVNC XI (FORCEP) ×3 IMPLANT
GAUZE 4X4 16PLY ~~LOC~~+RFID DBL (SPONGE) ×3 IMPLANT
GLOVE BIO SURGEON STRL SZ 6.5 (GLOVE) ×3 IMPLANT
GLOVE BIOGEL PI IND STRL 6.5 (GLOVE) ×6 IMPLANT
GLOVE BIOGEL PI MICRO STRL 6 (GLOVE) ×12 IMPLANT
GOWN STRL REUS W/ TWL LRG LVL3 (GOWN DISPOSABLE) ×12 IMPLANT
GRASPER SUT TROCAR 14GX15 (MISCELLANEOUS) IMPLANT
HOLDER FOLEY CATH W/STRAP (MISCELLANEOUS) IMPLANT
IRRIGATION SUCT STRKRFLW 2 WTP (MISCELLANEOUS) ×3 IMPLANT
KIT PROCEDURE DVNC SI (MISCELLANEOUS) IMPLANT
KIT TURNOVER KIT A (KITS) IMPLANT
LIGASURE IMPACT 36 18CM CVD LR (INSTRUMENTS) IMPLANT
MANIPULATOR ADVINCU DEL 3.0 PL (MISCELLANEOUS) IMPLANT
MANIPULATOR ADVINCU DEL 3.5 PL (MISCELLANEOUS) IMPLANT
MANIPULATOR UTERINE 4.5 ZUMI (MISCELLANEOUS) IMPLANT
NDL HYPO 21X1.5 SAFETY (NEEDLE) ×2 IMPLANT
NDL INSUFFLATION 14GA 120MM (NEEDLE) IMPLANT
NDL SPNL 20GX3.5 QUINCKE YW (NEEDLE) IMPLANT
NEEDLE HYPO 21X1.5 SAFETY (NEEDLE) ×2 IMPLANT
NEEDLE INSUFFLATION 14GA 120MM (NEEDLE) IMPLANT
NEEDLE SPNL 20GX3.5 QUINCKE YW (NEEDLE) IMPLANT
OBTURATOR OPTICALSTD 8 DVNC (TROCAR) ×3 IMPLANT
PACK ROBOT GYN CUSTOM WL (TRAY / TRAY PROCEDURE) ×3 IMPLANT
PAD ARMBOARD POSITIONER FOAM (MISCELLANEOUS) ×3 IMPLANT
PAD POSITIONING PINK XL (MISCELLANEOUS) ×3 IMPLANT
PORT ACCESS TROCAR AIRSEAL 12 (TROCAR) ×1 IMPLANT
SCISSORS LAP 5X45 EPIX DISP (ENDOMECHANICALS) ×1 IMPLANT
SCISSORS MNPLR CVD DVNC XI (INSTRUMENTS) ×3 IMPLANT
SCRUB CHG 4% DYNA-HEX 4OZ (MISCELLANEOUS) ×6 IMPLANT
SEAL UNIV 5-12 XI (MISCELLANEOUS) ×12 IMPLANT
SET TRI-LUMEN FLTR TB AIRSEAL (TUBING) ×3 IMPLANT
SPIKE FLUID TRANSFER (MISCELLANEOUS) ×3 IMPLANT
SPONGE T-LAP 18X18 ~~LOC~~+RFID (SPONGE) ×1 IMPLANT
SURGIFLO W/THROMBIN 8M KIT (HEMOSTASIS) IMPLANT
SUT MNCRL AB 4-0 PS2 18 (SUTURE) ×2 IMPLANT
SUT PDS AB 1 TP1 54 (SUTURE) ×2 IMPLANT
SUT VIC AB 0 CT1 27XBRD ANTBC (SUTURE) IMPLANT
SUT VIC AB 2-0 CT1 TAPERPNT 27 (SUTURE) ×1 IMPLANT
SUT VIC AB 4-0 PS2 18 (SUTURE) ×6 IMPLANT
SUT VICRYL 0 27 CT2 27 ABS (SUTURE) ×3 IMPLANT
SYR 10ML LL (SYRINGE) IMPLANT
SYSTEM BAG RETRIEVAL 10MM (BASKET) ×1 IMPLANT
SYSTEM WOUND ALEXIS 18CM MED (MISCELLANEOUS) ×1 IMPLANT
TRAP SPECIMEN MUCUS 40CC (MISCELLANEOUS) ×1 IMPLANT
TRAY FOLEY MTR SLVR 16FR STAT (SET/KITS/TRAYS/PACK) ×3 IMPLANT
TROCAR PORT AIRSEAL 5X120 (TROCAR) IMPLANT
UNDERPAD 30X36 HEAVY ABSORB (UNDERPADS AND DIAPERS) ×6 IMPLANT
WATER STERILE IRR 1000ML POUR (IV SOLUTION) ×3 IMPLANT
YANKAUER SUCT BULB TIP 10FT TU (MISCELLANEOUS) ×1 IMPLANT

## 2024-02-04 NOTE — Discharge Instructions (Signed)
 AFTER SURGERY INSTRUCTIONS   Return to work: 4-6 weeks if applicable   Activity: 1. Be up and out of the bed during the day.  Take a nap if needed.  You may walk up steps but be careful and use the hand rail.  Stair climbing will tire you more than you think, you may need to stop part way and rest.    2. No lifting or straining for 6 weeks over 10 pounds. No pushing, pulling, straining for 6 weeks.   3. No driving for 0-81 days when the following criteria have been met: Do not drive if you are taking narcotic pain medicine and make sure that your reaction time has returned.    4. You can shower as soon as the next day after surgery. Shower daily.  Use your regular soap and water (not directly on the incision) and pat your incision(s) dry afterwards; don't rub.  No tub baths or submerging your body in water until cleared by your surgeon. If you have the soap that was given to you by pre-surgical testing that was used before surgery, you do not need to use it afterwards because this can irritate your incisions.    5. No sexual activity and nothing in the vagina for 4-6 weeks, 12 weeks if you have a hysterectomy (removal of the uterus and cervix).   6. You may experience a small amount of clear drainage from your incisions, which is normal.  If the drainage persists, increases, or changes color please call the office.   7. Do not use creams, lotions, or ointments such as neosporin on your incisions after surgery until advised by your surgeon because they can cause removal of the dermabond glue on your incisions.     8. You may experience vaginal spotting after surgery or when the stitches at the top of the vagina begin to dissolve (if you have a hysterectomy).  The spotting is normal but if you experience heavy bleeding, call our office.   9. Take Tylenol  or ibuprofen  first for pain if you are able to take these medications.  Monitor your Tylenol  intake to a max of 4,000 mg in a 24 hour period. You  can alternate these medications after surgery.   Diet: 1. Low sodium Heart Healthy Diet is recommended but you are cleared to resume your normal (before surgery) diet after your procedure.   2. Continue on your current bowel regimen.   Wound Care: 1. Keep clean and dry.  Shower daily.   Reasons to call the Doctor: Fever - Oral temperature greater than 100.4 degrees Fahrenheit Foul-smelling vaginal discharge Difficulty urinating Nausea and vomiting Increased pain at the site of the incision that is unrelieved with pain medicine. Difficulty breathing with or without chest pain New calf pain especially if only on one side Sudden, continuing increased vaginal bleeding with or without clots.   Contacts: For questions or concerns you should contact:   Dr. Derrel Flies at [phone]   Vira Grieves, NP at [phone]   After Hours: call [phone] and have the GYN Oncologist paged/contacted (after 5 pm or on the weekends). You will speak with an after hours RN and let he or she know you have had surgery.   Messages sent via mychart are for non-urgent matters and are not responded to after hours so for urgent needs, please call the after hours number.

## 2024-02-04 NOTE — Interval H&P Note (Signed)
 History and Physical Interval Note:  02/04/2024 11:29 AM  [name]  has presented today for surgery, with the diagnosis of ADNEXAL MASS.  The various methods of treatment have been discussed with the patient and family. After consideration of risks, benefits and other options for treatment, the patient has consented to  Procedure(s) with comments: SALPINGO-OOPHORECTOMY, BILATERAL, ROBOT-ASSISTED (Bilateral) - POSSBILE STAGING; POSSIBLE LAPAROTOMY HYSTERECTOMY, TOTAL, ROBOT-ASSISTED (N/A) as a surgical intervention.  The patient's history has been reviewed, patient examined, no change in status, stable for surgery.  I have reviewed the patient's chart and labs.  Questions were answered to the patient's satisfaction.     Sathvik Tiedt

## 2024-02-04 NOTE — Op Note (Signed)
 GYNECOLOGIC ONCOLOGY OPERATIVE NOTE  Date of Service: 02/04/2024  Preoperative Diagnosis: Complex adnexal mass  Postoperative Diagnosis: Right ovarian mass  Procedures: Robotic assisted laparoscopic bilateral salpingo-oophorectomy, minilaparotomy for specimen retrieval (Modifer 22: increased duration of the procedure by >34min due to complexity due to adhesive disease requiring lysis of adhesions, and due to size and nature of adnexal mass requiring minilaparotomy, morcellation and fascial closure for safe specimen retrieval)   Surgeon: Derrel Flies, MD  Assistants: Abdul Hodgkin, MD and (an MD assistant was necessary for tissue manipulation, management of robotic instrumentation, retraction and positioning due to the complexity of the case and hospital policies)  Anesthesia: General  Estimated Blood Loss: 30 mL    Fluids: 1100 ml, crystalloid  Urine Output: 125 ml, clear yellow  Findings: On entry to abdomen, normal upper abdominal survey with smooth diaphragm, liver, stomach and normal appearing omentum and bowel. Omentum with adhesions to the midline anterior abdominal wall from the umbilicus to the lower abdominal wall. Approximate 12cm cystic and predominantly solid right ovarian mass with smooth surface. Normal appearing uterus and left fallopian tube and ovary. 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   Specimens:  ID Type Source Tests Collected by Time Destination  1 : Right fallopian tube and ovary Tissue PATH Gyn tumor resection SURGICAL PATHOLOGY Derrel Flies, MD 02/04/2024 1330   2 : Left fallopian tube and ovary Tissue PATH Gyn tumor resection SURGICAL PATHOLOGY Derrel Flies, MD 02/04/2024 1335   A : Pelvic washings Body Fluid PATH Cytology Pelvic Washing CYTOLOGY - NON PAP Derrel Flies, MD 02/04/2024 1307     Complications:  None  Indications for Procedure: [name] is a 53 y.o. woman with a complex adnexal mass and normal  tumor markers.  Prior to the procedure, all risks, benefits, and alternatives were discussed and informed surgical consent was signed.  Procedure: Patient was taken to the operating room where general anesthesia was achieved.  She was positioned in dorsal lithotomy and prepped and draped.  A foley catheter was inserted into the bladder.  A hulka manipulator was secured in the cervix.   A 12 mm incision was made in the left upper quadrant near Palmer's point.  The abdomen was entered with a 5 mm OptiView trocar under direct visualization.  The abdomen was insufflated, the patient placed in steep Trendelenburg, and additional trocars were placed as follows: an 8mm robotic trocar superior to the umbilicus, two 8 mm robotic trocars in the right abdomen, and one 8 mm robotic trocar in the left abdomen.  The left upper quadrant trocar was removed and replaced with a 12 mm airseal trocar.  All trocars were placed under direct visualization.  The bowels were moved into the upper abdomen.  The DaVinci robotic surgical system was brought to the patient's bedside and docked.   Pelvic washings were obtained. The right pelvic sidewall peritoneum was incised and the retroperitoneum entered.  The right ureter was identified.  The right infundibulopelvic ligament was isolated, cauterized, and transected.  The broad ligament was incised to the uterine cornu.  The utero-ovarian ligament and the proximal fallopian tube were isolated, cauterized, and transected.  The same procedure was performed on the contralateral side.  Both specimens were placed into separate Endo-Catch bags. The robotic instruments were removed and the robot was taken from the patient bedside.  With insufflation in place, the midline robotic trocar was removed. The incision was extended to approximately a 4cm vertical midline incision with  a scalped. The subcutaneous tissue was divided with electrocautery down to the fascia. The fascial and peritoneal  incisions were extended with electrocautery. The endocatch bags were grasped. The endocatch bag with the left adnexa was removed through the incision and handed off the field for permanent pathology. An alexis retractor was then placed. The endocatch bag with the right adnexa was then elevated out of the incision. The mass hand morcellated with a scalpel. The cystic portion was ruptured with clear fluid drainage. The mass in the bag was then removed from the abdomen. The bag was inspected and noted to intact. This was sent for frozen pathology which returned benign.   The cap was then placed on the Alexis retractor and a robotic trocar placed through the cap. The abdomen was re-insufflated. The pelvis was irrigated and all operative sites were found to be hemostatic.  All instruments were removed. The fascia at the 12 mm incision was closed with 0 Vicryl with the assistance of a PMI device  The alexis retractor and cap were removed, and the abdomen was desufflated. The fascia was closed with a running stitch of #1 PDS. Exparel  was injected at the incision site in standard fashion. The subcutaneous tissues were irrigated and hemostasis achieved. The subcutaneous space was approximated with 2-0 Vicryl in a running fashion. The skin was closed with a deep dermal stitch of 4-0 vicryl, 4-0 monocryl in a subcuticular fashion followed by surgical glue.   All ports were removed. The skin at all laparoscopic incisions was closed with 4-0 Vicryl to reapproximate the subcutaneous tissue and 4-0 monocryl in a subcuticular fashion followed by surgical glue.  Patient tolerated the procedure well. Sponge, lap, and instrument counts were correct. Gentamicin and Clindamycin  were administered prior to skin incision for routine perioperative antibiotics.  She was extubated and taken to the PACU in stable condition.  Derrel Flies, MD Gynecologic Oncology

## 2024-02-04 NOTE — Anesthesia Postprocedure Evaluation (Signed)
 Anesthesia Post Note  Patient: [name]  Procedure(s) Performed: ROBOTIC ASSISTED BILATERAL SALPINGO-OOPHORECTOMY, MINI-LAPAROTOMY FOR SPECIMEN RETRIEVAL (Bilateral)     Patient location during evaluation: PACU Anesthesia Type: General Level of consciousness: awake and alert Pain management: pain level controlled Vital Signs Assessment: post-procedure vital signs reviewed and stable Respiratory status: spontaneous breathing, nonlabored ventilation and respiratory function stable Cardiovascular status: blood pressure returned to baseline and stable Postop Assessment: no apparent nausea or vomiting Anesthetic complications: no  No notable events documented.  Last Vitals:  Vitals:   02/04/24 1600 02/04/24 1615  BP: (!) 146/104 129/76  Pulse: (!) 55 63  Resp: 16   Temp:    SpO2: 100% 98%    Last Pain:  Vitals:   02/04/24 1615  TempSrc:   PainSc: 3                  [name],W. EDMOND

## 2024-02-04 NOTE — Transfer of Care (Signed)
 Immediate Anesthesia Transfer of Care Note  Patient: KEATON STIREWALT  Procedure(s) Performed: ROBOTIC ASSISTED BILATERAL SALPINGO-OOPHORECTOMY, MINI-LAPAROTOMY FOR SPECIMEN RETRIEVAL (Bilateral)  Patient Location: PACU  Anesthesia Type:General  Level of Consciousness: awake, alert , and oriented  Airway & Oxygen Therapy: Patient Spontanous Breathing and Patient connected to face mask oxygen  Post-op Assessment: Report given to RN and Post -op Vital signs reviewed and stable  Post vital signs: Reviewed and stable  Last Vitals:  Vitals Value Taken Time  BP 132/89 02/04/24 1454  Temp 36.2 C 02/04/24 1454  Pulse 65 02/04/24 1458  Resp 8 02/04/24 1458  SpO2 100 % 02/04/24 1458  Vitals shown include unfiled device data.  Last Pain:  Vitals:   02/04/24 1454  TempSrc:   PainSc: Asleep      Patients Stated Pain Goal: 7 (02/04/24 1021)  Complications: No notable events documented.

## 2024-02-04 NOTE — Anesthesia Procedure Notes (Signed)
 Procedure Name: Intubation Date/Time: 02/04/2024 12:16 PM  Performed by: Shereece Wellborn J, CRNAPre-anesthesia Checklist: Patient identified, Emergency Drugs available, Suction available and Patient being monitored Patient Re-evaluated:Patient Re-evaluated prior to induction Oxygen Delivery Method: Circle System Utilized Preoxygenation: Pre-oxygenation with 100% oxygen Induction Type: IV induction Ventilation: Mask ventilation without difficulty Laryngoscope Size: Mac and 4 Grade View: Grade I Tube type: Oral Tube size: 7.0 mm Number of attempts: 1 Airway Equipment and Method: Stylet and Oral airway Placement Confirmation: ETT inserted through vocal cords under direct vision, positive ETCO2 and breath sounds checked- equal and bilateral Secured at: 21 cm Tube secured with: Tape Dental Injury: Teeth and Oropharynx as per pre-operative assessment  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

## 2024-02-04 NOTE — Progress Notes (Addendum)
 New Breast Cancer Diagnosis: LEFT BREAST  Patient here post LT lumpectomy 01/09/2024, to discuss next steps in [place] options.  Histology per Pathology Report: grade 3, DCIS FINAL MICROSCOPIC DIAGNOSIS:   A. 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 BREAST, LEFT ADDITIONAL SUPERIOR MARGIN, EXCISION:  - Benign breast parenchyma with no specific histopathologic changes  - Negative for carcinoma   C. BREAST, LEFT ADDITIONAL MEDIAL MARGIN, EXCISION:  - Benign breast parenchyma with no specific histopathologic changes  - Negative for carcinoma   D. BREAST, LEFT ADDITIONAL ANTERIOR MARGIN, EXCISION:  - Benign breast parenchyma with no specific histopathologic changes  - Negative for carcinoma   Receptor Status: ER(positive, weak-moderate staining), PR (negative), Her2-neu (), Ki-(%)  Surgeon and surgical plan, if any: Left lumpectomy 01/09/2024 Dr. Cherlynn Cornfield  Medical oncologist, treatment if any:  Dr. Arno Bibles  Family History of Breast/Ovarian/Prostate Cancer: Sisters, Grandmother x2.  Lymphedema issues, if any:  Denies    Pain issues, if any: Denies  SAFETY ISSUES: Prior radiation? No Pacemaker/ICD? No Possible current pregnancy?Postmenopausal Is the patient on methotrexate? No  Vitals: BP 121/83 (BP Location: Left Arm, Patient Position: Sitting)   Pulse 66   Temp (!) 97.3 F (36.3 C) (Temporal)   Resp 18   Ht 5\' 1"  (1.549 m)   Wt 216 lb 6 oz (98.1 kg)   LMP 03/28/2016   SpO2 100%   BMI 40.88 kg/m   Additional details:    Patient also has a recent DX of Ovarian Cx. Diagnosed shortly after the breast cancer. 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 She had adnexal mass surgery 02/04/2024.   This concludes the  interaction.  Avery Bodo, LPN

## 2024-02-05 ENCOUNTER — Encounter (HOSPITAL_COMMUNITY): Payer: Self-pay | Admitting: Psychiatry

## 2024-02-05 ENCOUNTER — Telehealth: Payer: Self-pay | Admitting: *Deleted

## 2024-02-05 NOTE — Telephone Encounter (Signed)
 Spoke with [name] this morning. She states she is eating, drinking and urinating well. She has not had a BM yet but is passing gas. She is taking senokot as prescribed and encouraged her to drink plenty of water. She denies fever or chills. Incisions are dry and intact. She rates her pain 3/10. Her pain is controlled with percocet.    Instructed to call office with any fever, chills, purulent drainage, uncontrolled pain or any other questions or concerns. Patient verbalizes understanding.   Pt aware of post op appointments as well as the office number [phone] and after hours number [phone] to call if she has any questions or concerns

## 2024-02-06 ENCOUNTER — Encounter: Payer: Self-pay | Admitting: Radiation Oncology

## 2024-02-06 ENCOUNTER — Ambulatory Visit
Admission: RE | Admit: 2024-02-06 | Discharge: 2024-02-06 | Disposition: A | Discharge status: Home / Self Care | Payer: MEDICAID | Source: Ambulatory Visit | Attending: Radiation Oncology | Admitting: Radiation Oncology

## 2024-02-06 VITALS — BP 121/83 | HR 66 | Temp 97.3°F | Resp 18 | Ht 61.0 in | Wt 216.4 lb

## 2024-02-06 DIAGNOSIS — Z8041 Family history of malignant neoplasm of ovary: Secondary | ICD-10-CM | POA: Diagnosis not present

## 2024-02-06 DIAGNOSIS — Z90722 Acquired absence of ovaries, bilateral: Secondary | ICD-10-CM | POA: Insufficient documentation

## 2024-02-06 DIAGNOSIS — I708 Atherosclerosis of other arteries: Secondary | ICD-10-CM | POA: Insufficient documentation

## 2024-02-06 DIAGNOSIS — F319 Bipolar disorder, unspecified: Secondary | ICD-10-CM | POA: Diagnosis not present

## 2024-02-06 DIAGNOSIS — Z791 Long term (current) use of non-steroidal anti-inflammatories (NSAID): Secondary | ICD-10-CM | POA: Diagnosis not present

## 2024-02-06 DIAGNOSIS — D573 Sickle-cell trait: Secondary | ICD-10-CM | POA: Diagnosis not present

## 2024-02-06 DIAGNOSIS — Z801 Family history of malignant neoplasm of trachea, bronchus and lung: Secondary | ICD-10-CM | POA: Diagnosis not present

## 2024-02-06 DIAGNOSIS — M129 Arthropathy, unspecified: Secondary | ICD-10-CM | POA: Insufficient documentation

## 2024-02-06 DIAGNOSIS — N839 Noninflammatory disorder of ovary, fallopian tube and broad ligament, unspecified: Secondary | ICD-10-CM | POA: Diagnosis not present

## 2024-02-06 DIAGNOSIS — Z170 Estrogen receptor positive status [ER+]: Secondary | ICD-10-CM | POA: Insufficient documentation

## 2024-02-06 DIAGNOSIS — D0512 Intraductal carcinoma in situ of left breast: Secondary | ICD-10-CM

## 2024-02-06 DIAGNOSIS — Z87891 Personal history of nicotine dependence: Secondary | ICD-10-CM | POA: Diagnosis not present

## 2024-02-06 DIAGNOSIS — I10 Essential (primary) hypertension: Secondary | ICD-10-CM | POA: Insufficient documentation

## 2024-02-06 DIAGNOSIS — Z7951 Long term (current) use of inhaled steroids: Secondary | ICD-10-CM | POA: Insufficient documentation

## 2024-02-06 DIAGNOSIS — Z803 Family history of malignant neoplasm of breast: Secondary | ICD-10-CM | POA: Insufficient documentation

## 2024-02-06 DIAGNOSIS — Z79899 Other long term (current) drug therapy: Secondary | ICD-10-CM | POA: Insufficient documentation

## 2024-02-06 LAB — SURGICAL PATHOLOGY

## 2024-02-06 LAB — CYTOLOGY - NON PAP

## 2024-02-06 NOTE — Progress Notes (Signed)
 Radiation Oncology         [phone] ________________________________  Name: [name]        MRN: [id number]  Date of Service: 02/06/2024 DOB: 11-10-1970  EX:[id number], Lavelle Posey, MD  Murleen Arms, MD     REFERRING PHYSICIAN: Murleen Arms, MD   DIAGNOSIS: The encounter diagnosis was Ductal carcinoma in situ (DCIS) of left breast.   HISTORY OF PRESENT ILLNESS: [name] is a 53 y.o. female with a diagnosis of left breast cancer.  The patient was noted to have an abnormality within the left breast on screening mammography.  Follow-up imaging demonstrated a area of calcifications in the left retroareolar region measuring 4 mm.  A biopsy was performed which returned positive for high grade ER positive DCIS.    Since her last visit, she underwent a left lumpectomy on 01/09/24 that showed a focal area of DCIS, about .6 mm in greatest dimension with clear margins. She also underwent robotic BSO yesterday for an ovarian mass yesterday with Dr. Daisey Dryer and is awaiting pathology results. She is seen to discuss adjuvant radiation to the left breast.   PREVIOUS RADIATION THERAPY: No   PAST MEDICAL HISTORY:  Past Medical History:  Diagnosis Date   Allergy    pollen, pcn   Anxiety    Arthritis    Asthma    Bipolar disorder (HCC)    Breast cancer (HCC)    Cataract    Depression    Fibroids    Glaucoma    History of ovarian cyst    Hypertension    Menometrorrhagia    Ovarian cancer (HCC)    Sickle cell trait (HCC)        PAST SURGICAL HISTORY: Past Surgical History:  Procedure Laterality Date   BREAST BIOPSY Left 12/05/2023   MM LT BREAST BX W LOC DEV 1ST LESION IMAGE BX SPEC STEREO GUIDE 12/05/2023 [facility]   BREAST BIOPSY  01/08/2024   MM LT RADIOACTIVE SEED LOC MAMMO GUIDE 01/08/2024 [facility]   BREAST LUMPECTOMY WITH RADIOACTIVE SEED LOCALIZATION Left 01/09/2024   Procedure: BREAST LUMPECTOMY WITH RADIOACTIVE SEED LOCALIZATION;  Surgeon: Lockie Rima, MD;  Location: [facility];  Service: General;  Laterality: Left;  LEFT BREAST SEED LOCALIZED LUMPECTOMY   CATARACT EXTRACTION, BILATERAL     CESAREAN SECTION     all three pregnancies   COLONOSCOPY  07/04/2021   ROBOTIC ASSISTED BILATERAL SALPINGO OOPHERECTOMY Bilateral 02/04/2024   Procedure: ROBOTIC ASSISTED BILATERAL SALPINGO-OOPHORECTOMY, MINI-LAPAROTOMY FOR SPECIMEN RETRIEVAL;  Surgeon: Derrel Flies, MD;  Location: [facility];  Service: Gynecology;  Laterality: Bilateral;  POSSBILE STAGING; POSSIBLE LAPAROTOMY     FAMILY HISTORY:  Family History  Problem Relation Age of Onset   Breast cancer Mother        dx 51s; mets   Hypertension Mother    Heart disease Father    Throat cancer Father        d. 51   Lung cancer Maternal Uncle        x3 mat uncles   Breast cancer Maternal Grandmother        dx >50; mets to brain   Lung cancer Maternal Grandfather    Breast cancer Paternal Grandmother 56   Lung cancer Paternal Grandfather    Ovarian cancer Other        MGM's sister   Other Neg Hx    Esophageal cancer Neg Hx    Rectal cancer Neg Hx    Stomach cancer Neg Hx  Prostate cancer Neg Hx    Pancreatic cancer Neg Hx    Endometrial cancer Neg Hx      SOCIAL HISTORY:  reports that she quit smoking about 7 years ago. Her smoking use included cigarettes. She has never been exposed to tobacco smoke. She has never used smokeless tobacco. She reports current alcohol  use. She reports that she does not use drugs. The patient is divorced and lives in [place]. She is accompanied by her two daughters and she enjoys crocheting.    ALLERGIES: Bupropion hcl er (sr), Fruit & vegetable daily [nutritional supplements], Penicillins, Pollen extract-tree extract, and Other   MEDICATIONS:  Current Outpatient Medications  Medication Sig Dispense Refill   albuterol  (PROAIR  HFA) 108 (90 Base) MCG/ACT inhaler Inhale 2 puffs into the lungs every 6 (six) hours as needed for wheezing or shortness  of breath. 18 g 2   brimonidine-timolol (COMBIGAN) 0.2-0.5 % ophthalmic solution Place 1 drop into both eyes every 12 (twelve) hours.     busPIRone  (BUSPAR ) 15 MG tablet Take 1 tablet by mouth 2 times per day (Patient taking differently: Take 15 mg by mouth in the morning.) 60 tablet 2   cariprazine  (VRAYLAR ) 1.5 MG capsule Take 2 capsules by mouth daily. (Patient taking differently: Take 1.5 mg by mouth every evening.) 60 capsule 2   cholecalciferol (VITAMIN D3) 25 MCG (1000 UNIT) tablet Take 1,000 Units by mouth in the morning.     cloNIDine  (CATAPRES ) 0.2 MG tablet Take 1 tablet (0.2 mg total) by mouth at bedtime. 90 tablet 1   cyclobenzaprine  (FLEXERIL ) 10 MG tablet Take 1 tablet (10 mg total) by mouth 2 (two) times daily as needed for muscle spasms 90 tablet 1   DULoxetine  (CYMBALTA ) 60 MG capsule TAKE 1 CAPSULE (60 MG TOTAL) BY MOUTH DAILY. 30 capsule 6   FLUoxetine  (PROZAC ) 40 MG capsule Take 40 mg by mouth in the morning.     fluticasone -salmeterol (ADVAIR  DISKUS) 500-50 MCG/ACT AEPB Inhale 1 puff into the lungs 2 (two) times daily. 180 each 1   gabapentin  (NEURONTIN ) 300 MG capsule Take 2 capsules (600 mg total) by mouth 2 (two) times daily. 120 capsule 6   lactulose , encephalopathy, (CHRONULAC ) 10 GM/15ML SOLN Take 15 mLs (10 g total) by mouth 2 (two) times daily as needed for mild constipation. (Patient taking differently: Take 6.6667 g by mouth in the morning.) 946 mL 1   levocetirizine (XYZAL ) 5 MG tablet Take 1 tablet (5 mg total) by mouth every evening. (Patient taking differently: Take 5 mg by mouth in the morning.) 30 tablet 2   lidocaine  (LIDODERM ) 5 % Place 1 patch onto the skin daily. Remove & Discard patch within 12 hours or as directed by MD (Patient taking differently: Place 1 patch onto the skin daily as needed (pain.).) 30 patch 3   LINZESS 145 MCG CAPS capsule Take 145 mcg by mouth daily.     meloxicam  (MOBIC ) 7.5 MG tablet Take 1 tablet (7.5 mg total) by mouth daily as  needed. (Patient taking differently: Take 7.5 mg by mouth in the morning.) 90 tablet 1   Misc. Devices MISC 4-pronged cane.  Diagnosis-spinal stenosis 1 each 0   Misc. Devices MISC Rollator walker with seat.  Diagnosis spinal stenosis 1 each 0   naloxone (NARCAN) nasal spray 4 mg/0.1 mL 1 (ONE) SPRAY BY NOSE AS NEEDED, IF FOUND UNRESPONSIVE THEN SPRAY INTO NOSE AND CALL 911     oxyCODONE  (OXY IR/ROXICODONE ) 5 MG immediate release tablet Take 1  tablet (5 mg total) by mouth every 6 (six) hours as needed for severe pain (pain score 7-10). 5 tablet 0   oxyCODONE -acetaminophen  (PERCOCET) 10-325 MG tablet Take 1 tablet by mouth in the morning, at noon, in the evening, and at bedtime.     prazosin  (MINIPRESS ) 1 MG capsule Take 1 capsule by mouth at bedtime for nightmares 30 capsule 2   traZODone  (DESYREL ) 50 MG tablet TAKE 1 TABLET (50 MG TOTAL) BY MOUTH AT BEDTIME AS NEEDED. FOR SLEEP (Patient taking differently: Take 50 mg by mouth at bedtime.) 30 tablet 6   triamcinolone  cream (KENALOG ) 0.1 % Apply 1 application topically daily to arm for eczema 15 g 0   triamterene-hydrochlorothiazide (MAXZIDE-25) 37.5-25 MG tablet Take 1 tablet by mouth in the morning.     No current facility-administered medications for this encounter.     REVIEW OF SYSTEMS: On review of systems, the patient reports that she is doing well. Her abdomen is sore, but she feels like she's doing well overall. She denies any incisional separation or drainage. No other complaints are verbalized.      PHYSICAL EXAM:  Wt Readings from Last 3 Encounters:  02/04/24 207 lb 3.7 oz (94 kg)  01/27/24 207 lb 3.7 oz (94 kg)  01/27/24 207 lb 12.8 oz (94.3 kg)   Temp Readings from Last 3 Encounters:  02/04/24 (!) 97.1 F (36.2 C)  01/27/24 98.2 F (36.8 C) (Oral)  01/27/24 98.5 F (36.9 C) (Oral)   BP Readings from Last 3 Encounters:  02/04/24 129/76  01/27/24 (!) 137/92  01/27/24 130/88   Pulse Readings from Last 3 Encounters:   02/04/24 63  01/27/24 77  01/27/24 77    In general this is a well appearing American female in no acute distress. She's alert and oriented x4 and appropriate throughout the examination. Cardiopulmonary assessment is negative for acute distress and she exhibits normal effort. The left breast incision is well healed without erythema, separation, or drainage.     ECOG = 1  0 - Asymptomatic (Fully active, able to carry on all predisease activities without restriction)  1 - Symptomatic but completely ambulatory (Restricted in physically strenuous activity but ambulatory and able to carry out work of a light or sedentary nature. For example, light housework, office work)  2 - Symptomatic, <50% in bed during the day (Ambulatory and capable of all self care but unable to carry out any work activities. Up and about more than 50% of waking hours)  3 - Symptomatic, >50% in bed, but not bedbound (Capable of only limited self-care, confined to bed or chair 50% or more of waking hours)  4 - Bedbound (Completely disabled. Cannot carry on any self-care. Totally confined to bed or chair)  5 - Death   Aurea Blossom MM, Creech RH, Tormey DC, et al. [phone]). "Toxicity and response criteria of the [facility]". Am. Hillard Lowes. Oncol. 5 (6): 649-55    LABORATORY DATA:  Lab Results  Component Value Date   WBC 9.6 01/27/2024   HGB 12.2 01/27/2024   HCT 38.3 01/27/2024   MCV 80.3 01/27/2024   PLT 381 01/27/2024   Lab Results  Component Value Date   NA 140 01/27/2024   K 3.5 01/27/2024   CL 98 01/27/2024   CO2 31 01/27/2024   Lab Results  Component Value Date   ALT 20 01/27/2024   AST 22 01/27/2024   ALKPHOS 74 01/27/2024   BILITOT 1.0 01/27/2024  RADIOGRAPHY: MR ABDOMEN WWO CONTRAST Result Date: 01/24/2024 CLINICAL DATA:  Characterize liver lesions identified by prior staging CT, ovarian cancer, breast cancer EXAM: MRI ABDOMEN WITHOUT AND WITH CONTRAST TECHNIQUE:  Multiplanar multisequence MR imaging of the abdomen was performed both before and after the administration of intravenous contrast. CONTRAST:  9mL GADAVIST  GADOBUTROL  1 MMOL/ML IV SOLN COMPARISON:  CT chest abdomen pelvis, 01/14/2024 FINDINGS: Lower chest: No acute abnormality. Fluid signal seroma in the left breast, in keeping with recent breast biopsy (series 4, image 7) Hepatobiliary: No solid liver abnormality is seen. Multiple fluid signal cysts of varying sizes scattered throughout the liver, the majority very tiny. Additional small flash filling hemangioma in the peripheral right lobe of the liver (series 11, image 27). No solid lesions or suspicious contrast enhancement. No gallstones, gallbladder wall thickening, or biliary dilatation. Pancreas: Unremarkable. No pancreatic ductal dilatation or surrounding inflammatory changes. Spleen: Normal in size without significant abnormality. Adrenals/Urinary Tract: Adrenal glands are unremarkable. Kidneys are normal, without renal calculi, solid lesion, or hydronephrosis. Stomach/Bowel: Stomach is within normal limits. No evidence of bowel wall thickening, distention, or inflammatory changes. Vascular/Lymphatic: No significant vascular findings are present. No enlarged abdominal lymph nodes. Other: No abdominal wall hernia or abnormality. No ascites. Large mixed solid and cystic pelvic mass, incompletely imaged on select coronal sequences only (series 3, image 14). Musculoskeletal: No acute or significant osseous findings. IMPRESSION: 1. Multiple fluid signal cysts of varying sizes scattered throughout the liver, the majority very tiny. Additional small flash filling hemangioma in the peripheral right lobe of the liver. No solid lesions or suspicious contrast enhancement. No evidence of hepatic metastatic disease. 2. Large mixed solid and cystic pelvic mass, incompletely imaged on selected coronal sequences only, consistent with known primary ovarian malignancy. 3.  Fluid signal seroma in the left breast, in keeping with recent breast biopsy. Electronically Signed   By: Fredricka Jenny M.D.   On: 01/24/2024 10:40   CT CHEST ABDOMEN PELVIS W CONTRAST Result Date: 01/14/2024 CLINICAL DATA:  Ovarian cancer staging.  Left breast cancer. * Tracking Code: BO * EXAM: CT CHEST, ABDOMEN, AND PELVIS WITH CONTRAST TECHNIQUE: Multidetector CT imaging of the chest, abdomen and pelvis was performed following the standard protocol during bolus administration of intravenous contrast. RADIATION DOSE REDUCTION: This exam was performed according to the departmental dose-optimization program which includes automated exposure control, adjustment of the mA and/or kV according to patient size and/or use of iterative reconstruction technique. CONTRAST:  OMNIPAQUE  IOHEXOL  300 MG/ML  SOLN COMPARISON:  MRI pelvis 12/06/2023 FINDINGS: CT CHEST FINDINGS Cardiovascular: Mild atheromatous vascular calcification of the thoracic aorta. Mediastinum/Nodes: Unremarkable Lungs/Pleura: Unremarkable Musculoskeletal: Left upper breast biopsy or lumpectomy site with a small amount of gas in the soft tissues. CT ABDOMEN PELVIS FINDINGS Hepatobiliary: Scattered fluid signal intensity lesions in the liver, although some of the liver lesions are too small to characterize. The largest of the visualize lesions is in the left hepatic lobe and measures 1.5 by 1.3 cm with internal density of 18 Hounsfield units. Infiltrative appearing hypodensity in segment 4B adjacent to the falciform ligament, probably from steatosis given the location. Gallbladder unremarkable. Pancreas: Unremarkable Spleen: Unremarkable Adrenals/Urinary Tract: Unremarkable Stomach/Bowel: Unremarkable Vascular/Lymphatic: Mild abdominal aortic atherosclerotic vascular calcification. No pathologic adenopathy. Reproductive: Complex 10.2 by 8.7 by 10.8 cm lesion anterior to the uterus and abutting both ovaries but thought to be arising from the left  ovary on prior MRI. Similar appearance to MRI of 12/06/2023. Other: Small amount of mildly complex free  fluid. 1.5 by 1.0 cm calcified lesion along the dependent margin of this free fluid on image 96 series 2, thought to be free within the fluid collection on prior MRI, and quite likely a peritoneal loose body. There is also a small amount of free fluid in the left lower paracolic gutter adjacent to the ovarian mass as on image 66 series 5. No obvious nodularity in the paracolic gutters or omentum. Musculoskeletal: Lower lumbar spondylosis and degenerative disc disease with impingement observed at the L3-4 and L4-5 levels. Mild grade 1 degenerative anterolisthesis at L3-4. IMPRESSION: 1. Complex 10.2 by 8.7 by 10.8 cm lesion anterior to the uterus and abutting both ovaries but thought to be arising from the left ovary on prior MRI. Similar appearance to MRI of 12/06/2023 with high suspicion for ovarian neoplasm. Correlate with tumor marker levels; resection likely warranted. 2. Small amount of mildly complex free fluid in the pelvis. 1.5 by 1.0 cm calcified lesion along the dependent margin of this free fluid, thought to be free within the fluid collection on prior MRI, and quite likely a peritoneal loose body. There is also a small amount of free fluid in the left lower paracolic gutter adjacent to the ovarian mass. No obvious nodularity in the paracolic gutters or omentum. 3. Scattered fluid signal intensity lesions in the liver, although some of the liver lesions are too small to characterize. The largest of the visualize lesions is in the left hepatic lobe and measures 1.5 by 1.3 cm with internal density of 18 Hounsfield units. These are probably cysts, but hepatic protocol MRI with and without contrast is recommended for definitive characterization. 4. Hypodensity inferiorly in segment 4 of the liver adjacent to the falciform ligament is most likely to be from focal steatosis. 5. Left upper breast biopsy or  lumpectomy site with a small amount of gas in the soft tissues. 6. Lower lumbar spondylosis and degenerative disc disease with notable impingement observed at the L3-4 and L4-5 levels. Mild grade 1 degenerative anterolisthesis at L3-4. 7.  Aortic Atherosclerosis (ICD10-I70.0). Electronically Signed   By: Freida Jes M.D.   On: 01/14/2024 15:53   MM Breast Surgical Specimen Result Date: 01/09/2024 CLINICAL DATA:  53 year old with biopsy-proven high-grade DCIS involving the inner retroareolar LEFT breast. Radioactive seed localization was performed yesterday in anticipation of today's lumpectomy. EXAM: SPECIMEN RADIOGRAPH OF THE LEFT BREAST COMPARISON:  Previous exam(s). FINDINGS: Status post excision of the LEFT breast. The radioactive seed and the X shaped tissue marking clip are present in the specimen. The seed is intact. The tomosynthesis images confirm that the residual calcifications at the biopsy site are present in the specimen. This was discussed by telephone with the operating room nurse at the time of interpretation on 01/09/2004. IMPRESSION: Specimen radiograph of the LEFT breast. Electronically Signed   By: Rinda Cheers M.D.   On: 01/09/2024 13:47   MM LT RADIOACTIVE SEED LOC MAMMO GUIDE Result Date: 01/08/2024 CLINICAL DATA:  53 year old with biopsy-proven high-grade DCIS involving the inner retroareolar LEFT breast. Radioactive seed localization is performed in anticipation of lumpectomy which is scheduled for tomorrow EXAM: MAMMOGRAPHIC GUIDED RADIOACTIVE SEED LOCALIZATION OF THE LEFT BREAST COMPARISON:  Previous exam(s). FINDINGS: Patient presents for radioactive seed localization prior to LEFT breast lumpectomy. I met with the patient and we discussed the procedure of seed localization including benefits and alternatives. We discussed the high likelihood of a successful procedure. We discussed the risks of the procedure including infection, bleeding, tissue injury and further  surgery. We discussed the low dose of radioactivity involved in the procedure. Informed, written consent was given. The usual time-out protocol was performed immediately prior to the procedure. Using mammographic guidance, sterile technique with chlorhexidine  as skin antisepsis, 1% lidocaine  as local anesthesia, an I-125 radioactive seed was used to localize the X shaped tissue marking clip and the residual calcifications associated with the biopsy-proven DCIS using a medial approach. The follow-up mammogram images confirm that the seed is appropriately positioned adjacent to the clip and the residual calcifications. The images are marked for Dr. Cherlynn Cornfield. Follow-up survey of the patient confirms the presence of the radioactive seed. Order number of I-125 seed: [id number] Total activity: 0.236 mCi Reference Date: 12/05/2023 The patient tolerated the procedure well without apparent immediate complications. [facility] with instructions regarding seed removal. IMPRESSION: Radioactive seed localization of biopsy-proven high-grade DCIS involving the inner retroareolar LEFT breast. Electronically Signed   By: Rinda Cheers M.D.   On: 01/08/2024 14:04       IMPRESSION/PLAN: 1. High grade, ER positive DCIS of the left breast. Dr. Jeryl Moris has reviewed her final pathology results. She has done well since her surgery and we discussed the rationale for external radiotherapy to the breast  to reduce risks of local recurrence. Dr. Arno Bibles anticipates adjuvant antiestrogen therapy to follow. We discussed the risks, benefits, short, and long term effects of radiotherapy, as well as the curative intent, and the patient is interested in proceeding. I reviewed the delivery and logistics of radiotherapy and that Dr. Jeryl Moris recommends 4 weeks of radiotherapy to the left breast with deep inspiration breath hold technique. Written consent is obtained and placed in the chart, a copy was provided to the patient.  She will simulate today. 2. Ovarian mass. The patient is awaiting her pathology results and will follow up with Dr. Daisey Dryer in a few weeks. This will be followed expectantly.    In a visit lasting 45 minutes, greater than 50% of the time was spent face to face reviewing her case, as well as in preparation of, discussing, and coordinating the patient's care.  The above documentation reflects my direct findings during this shared patient visit. Please see the separate note by Dr. Jeryl Moris on this date for the remainder of the patient's plan of care.    Shelvia Dick, [facility]    **Disclaimer: This note was dictated with voice recognition software. Similar sounding words can inadvertently be transcribed and this note may contain transcription errors which may not have been corrected upon publication of note.**

## 2024-02-07 ENCOUNTER — Ambulatory Visit
Admission: RE | Admit: 2024-02-07 | Discharge: 2024-02-07 | Disposition: A | Discharge status: Home / Self Care | Payer: MEDICAID | Source: Ambulatory Visit | Attending: Radiation Oncology | Admitting: Radiation Oncology

## 2024-02-07 DIAGNOSIS — D0512 Intraductal carcinoma in situ of left breast: Secondary | ICD-10-CM | POA: Diagnosis present

## 2024-02-11 ENCOUNTER — Ambulatory Visit: Payer: Self-pay | Admitting: Psychiatry

## 2024-02-13 ENCOUNTER — Encounter: Payer: Self-pay | Admitting: *Deleted

## 2024-02-13 DIAGNOSIS — D0512 Intraductal carcinoma in situ of left breast: Secondary | ICD-10-CM

## 2024-02-17 ENCOUNTER — Ambulatory Visit
Admission: RE | Admit: 2024-02-17 | Discharge: 2024-02-17 | Disposition: A | Discharge status: Home / Self Care | Payer: MEDICAID | Source: Ambulatory Visit | Attending: Radiation Oncology | Admitting: Radiation Oncology

## 2024-02-17 DIAGNOSIS — Z803 Family history of malignant neoplasm of breast: Secondary | ICD-10-CM | POA: Diagnosis not present

## 2024-02-17 DIAGNOSIS — Z170 Estrogen receptor positive status [ER+]: Secondary | ICD-10-CM | POA: Diagnosis not present

## 2024-02-17 DIAGNOSIS — Z880 Allergy status to penicillin: Secondary | ICD-10-CM | POA: Diagnosis not present

## 2024-02-17 DIAGNOSIS — Z48810 Encounter for surgical aftercare following surgery on the sense organs: Secondary | ICD-10-CM | POA: Diagnosis not present

## 2024-02-17 DIAGNOSIS — Z801 Family history of malignant neoplasm of trachea, bronchus and lung: Secondary | ICD-10-CM | POA: Diagnosis not present

## 2024-02-17 DIAGNOSIS — Z8041 Family history of malignant neoplasm of ovary: Secondary | ICD-10-CM | POA: Diagnosis not present

## 2024-02-17 DIAGNOSIS — Z8249 Family history of ischemic heart disease and other diseases of the circulatory system: Secondary | ICD-10-CM | POA: Diagnosis not present

## 2024-02-17 DIAGNOSIS — Z87891 Personal history of nicotine dependence: Secondary | ICD-10-CM | POA: Diagnosis not present

## 2024-02-17 DIAGNOSIS — Z1721 Progesterone receptor positive status: Secondary | ICD-10-CM | POA: Diagnosis not present

## 2024-02-17 DIAGNOSIS — Z90722 Acquired absence of ovaries, bilateral: Secondary | ICD-10-CM | POA: Diagnosis not present

## 2024-02-17 DIAGNOSIS — Z5986 Financial insecurity: Secondary | ICD-10-CM | POA: Diagnosis not present

## 2024-02-17 DIAGNOSIS — J45909 Unspecified asthma, uncomplicated: Secondary | ICD-10-CM | POA: Diagnosis not present

## 2024-02-17 DIAGNOSIS — N950 Postmenopausal bleeding: Secondary | ICD-10-CM | POA: Diagnosis not present

## 2024-02-17 DIAGNOSIS — D0512 Intraductal carcinoma in situ of left breast: Secondary | ICD-10-CM | POA: Diagnosis present

## 2024-02-17 DIAGNOSIS — Z79811 Long term (current) use of aromatase inhibitors: Secondary | ICD-10-CM | POA: Diagnosis not present

## 2024-02-17 DIAGNOSIS — Z808 Family history of malignant neoplasm of other organs or systems: Secondary | ICD-10-CM | POA: Diagnosis not present

## 2024-02-17 DIAGNOSIS — Z79899 Other long term (current) drug therapy: Secondary | ICD-10-CM | POA: Diagnosis not present

## 2024-02-17 DIAGNOSIS — Z888 Allergy status to other drugs, medicaments and biological substances status: Secondary | ICD-10-CM | POA: Diagnosis not present

## 2024-02-17 DIAGNOSIS — Z8543 Personal history of malignant neoplasm of ovary: Secondary | ICD-10-CM | POA: Diagnosis not present

## 2024-02-18 ENCOUNTER — Other Ambulatory Visit: Payer: Self-pay

## 2024-02-18 DIAGNOSIS — D0512 Intraductal carcinoma in situ of left breast: Secondary | ICD-10-CM | POA: Diagnosis not present

## 2024-02-18 LAB — RAD ONC ARIA SESSION SUMMARY
Course Elapsed Days: 0
Plan Fractions Treated to Date: 1
Plan Prescribed Dose Per Fraction: 2.66 Gy
Plan Total Fractions Prescribed: 16
Plan Total Prescribed Dose: 42.56 Gy
Reference Point Dosage Given to Date: 2.66 Gy
Reference Point Session Dosage Given: 2.66 Gy
Session Number: 1

## 2024-02-19 ENCOUNTER — Ambulatory Visit
Admission: RE | Admit: 2024-02-19 | Discharge: 2024-02-19 | Disposition: A | Discharge status: Home / Self Care | Payer: MEDICAID | Source: Ambulatory Visit | Attending: Radiation Oncology | Admitting: Radiation Oncology

## 2024-02-19 ENCOUNTER — Other Ambulatory Visit: Payer: Self-pay

## 2024-02-19 DIAGNOSIS — D0512 Intraductal carcinoma in situ of left breast: Secondary | ICD-10-CM

## 2024-02-19 LAB — RAD ONC ARIA SESSION SUMMARY
Course Elapsed Days: 1
Plan Fractions Treated to Date: 2
Plan Prescribed Dose Per Fraction: 2.66 Gy
Plan Total Fractions Prescribed: 16
Plan Total Prescribed Dose: 42.56 Gy
Reference Point Dosage Given to Date: 5.32 Gy
Reference Point Session Dosage Given: 2.66 Gy
Session Number: 2

## 2024-02-19 MED ORDER — RADIAPLEXRX EX GEL
Freq: Once | CUTANEOUS | Status: AC
  Administered 2024-02-19: 1 via TOPICAL

## 2024-02-20 ENCOUNTER — Encounter: Payer: Self-pay | Admitting: Psychiatry

## 2024-02-20 ENCOUNTER — Ambulatory Visit
Admission: RE | Admit: 2024-02-20 | Discharge: 2024-02-20 | Discharge status: Home / Self Care | Payer: MEDICAID | Source: Ambulatory Visit | Attending: Radiation Oncology

## 2024-02-20 ENCOUNTER — Other Ambulatory Visit: Payer: Self-pay

## 2024-02-20 DIAGNOSIS — D0512 Intraductal carcinoma in situ of left breast: Secondary | ICD-10-CM

## 2024-02-20 LAB — RAD ONC ARIA SESSION SUMMARY
Course Elapsed Days: 2
Plan Fractions Treated to Date: 3
Plan Prescribed Dose Per Fraction: 2.66 Gy
Plan Total Fractions Prescribed: 16
Plan Total Prescribed Dose: 42.56 Gy
Reference Point Dosage Given to Date: 7.98 Gy
Reference Point Session Dosage Given: 2.66 Gy
Session Number: 3

## 2024-02-21 ENCOUNTER — Ambulatory Visit
Admission: RE | Admit: 2024-02-21 | Discharge: 2024-02-21 | Disposition: A | Discharge status: Home / Self Care | Payer: MEDICAID | Source: Ambulatory Visit | Attending: Radiation Oncology | Admitting: Radiation Oncology

## 2024-02-21 ENCOUNTER — Ambulatory Visit
Admission: RE | Admit: 2024-02-21 | Discharge: 2024-02-21 | Discharge status: Home / Self Care | Payer: MEDICAID | Source: Ambulatory Visit | Attending: Radiation Oncology

## 2024-02-21 ENCOUNTER — Other Ambulatory Visit: Payer: Self-pay

## 2024-02-21 DIAGNOSIS — D0512 Intraductal carcinoma in situ of left breast: Secondary | ICD-10-CM | POA: Diagnosis not present

## 2024-02-21 LAB — RAD ONC ARIA SESSION SUMMARY
Course Elapsed Days: 3
Plan Fractions Treated to Date: 4
Plan Prescribed Dose Per Fraction: 2.66 Gy
Plan Total Fractions Prescribed: 16
Plan Total Prescribed Dose: 42.56 Gy
Reference Point Dosage Given to Date: 10.64 Gy
Reference Point Session Dosage Given: 2.66 Gy
Session Number: 4

## 2024-02-21 MED ORDER — ALRA NON-METALLIC DEODORANT (RAD-ONC)
1.0000 | Freq: Once | TOPICAL | Status: AC
  Administered 2024-02-21: 1 via TOPICAL

## 2024-02-21 MED ORDER — RADIAPLEXRX EX GEL: Freq: Once | CUTANEOUS | Status: AC

## 2024-02-24 ENCOUNTER — Other Ambulatory Visit: Payer: Self-pay

## 2024-02-24 ENCOUNTER — Encounter: Payer: Self-pay | Admitting: Psychiatry

## 2024-02-24 ENCOUNTER — Inpatient Hospital Stay: Payer: MEDICAID | Attending: Hematology and Oncology | Admitting: Psychiatry

## 2024-02-24 ENCOUNTER — Ambulatory Visit
Admission: RE | Admit: 2024-02-24 | Discharge: 2024-02-24 | Disposition: A | Discharge status: Home / Self Care | Payer: MEDICAID | Source: Ambulatory Visit | Attending: Radiation Oncology | Admitting: Radiation Oncology

## 2024-02-24 VITALS — BP 117/65 | HR 62 | Temp 98.9°F | Resp 17 | Ht 61.0 in | Wt 207.4 lb

## 2024-02-24 DIAGNOSIS — Z79899 Other long term (current) drug therapy: Secondary | ICD-10-CM | POA: Insufficient documentation

## 2024-02-24 DIAGNOSIS — Z5986 Financial insecurity: Secondary | ICD-10-CM | POA: Insufficient documentation

## 2024-02-24 DIAGNOSIS — Z8041 Family history of malignant neoplasm of ovary: Secondary | ICD-10-CM | POA: Insufficient documentation

## 2024-02-24 DIAGNOSIS — Z48810 Encounter for surgical aftercare following surgery on the sense organs: Secondary | ICD-10-CM | POA: Insufficient documentation

## 2024-02-24 DIAGNOSIS — D0512 Intraductal carcinoma in situ of left breast: Secondary | ICD-10-CM | POA: Diagnosis not present

## 2024-02-24 DIAGNOSIS — Z8543 Personal history of malignant neoplasm of ovary: Secondary | ICD-10-CM | POA: Insufficient documentation

## 2024-02-24 DIAGNOSIS — Z803 Family history of malignant neoplasm of breast: Secondary | ICD-10-CM | POA: Insufficient documentation

## 2024-02-24 DIAGNOSIS — N950 Postmenopausal bleeding: Secondary | ICD-10-CM | POA: Insufficient documentation

## 2024-02-24 DIAGNOSIS — Z8249 Family history of ischemic heart disease and other diseases of the circulatory system: Secondary | ICD-10-CM | POA: Insufficient documentation

## 2024-02-24 DIAGNOSIS — Z808 Family history of malignant neoplasm of other organs or systems: Secondary | ICD-10-CM | POA: Insufficient documentation

## 2024-02-24 DIAGNOSIS — Z90722 Acquired absence of ovaries, bilateral: Secondary | ICD-10-CM | POA: Insufficient documentation

## 2024-02-24 DIAGNOSIS — N9489 Other specified conditions associated with female genital organs and menstrual cycle: Secondary | ICD-10-CM

## 2024-02-24 DIAGNOSIS — Z87891 Personal history of nicotine dependence: Secondary | ICD-10-CM | POA: Insufficient documentation

## 2024-02-24 DIAGNOSIS — Z880 Allergy status to penicillin: Secondary | ICD-10-CM | POA: Insufficient documentation

## 2024-02-24 DIAGNOSIS — Z888 Allergy status to other drugs, medicaments and biological substances status: Secondary | ICD-10-CM | POA: Insufficient documentation

## 2024-02-24 DIAGNOSIS — Z170 Estrogen receptor positive status [ER+]: Secondary | ICD-10-CM | POA: Insufficient documentation

## 2024-02-24 DIAGNOSIS — J45909 Unspecified asthma, uncomplicated: Secondary | ICD-10-CM | POA: Insufficient documentation

## 2024-02-24 DIAGNOSIS — Z1721 Progesterone receptor positive status: Secondary | ICD-10-CM | POA: Insufficient documentation

## 2024-02-24 DIAGNOSIS — Z79811 Long term (current) use of aromatase inhibitors: Secondary | ICD-10-CM | POA: Insufficient documentation

## 2024-02-24 DIAGNOSIS — Z801 Family history of malignant neoplasm of trachea, bronchus and lung: Secondary | ICD-10-CM | POA: Insufficient documentation

## 2024-02-24 LAB — RAD ONC ARIA SESSION SUMMARY
Course Elapsed Days: 6
Plan Fractions Treated to Date: 5
Plan Prescribed Dose Per Fraction: 2.66 Gy
Plan Total Fractions Prescribed: 16
Plan Total Prescribed Dose: 42.56 Gy
Reference Point Dosage Given to Date: 13.3 Gy
Reference Point Session Dosage Given: 2.66 Gy
Session Number: 5

## 2024-02-24 NOTE — Patient Instructions (Signed)
 It was a pleasure to see you in clinic today. - Healing well from surgery. Restrictions lifted at 4 weeks. - Okay to return to routine care.   Thank you very much for allowing me to provide care for you today.  I appreciate your confidence in choosing our Gynecologic Oncology team at [facility].  If you have any questions about your visit today please call our office or send us  a MyChart message and we will get back to you as soon as possible.

## 2024-02-24 NOTE — Progress Notes (Signed)
 Gynecologic Oncology Return Clinic Visit  Date of Service: 02/24/2024 Referring Provider: Verlyn Goad, MD   Assessment & Plan: [name] is a 53 y.o. 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  Postop: - Pt recovering well from surgery and healing appropriately postoperatively - Intraoperative findings and pathology results reviewed. Benign. - Ongoing postoperative expectations and precautions reviewed.  - Given that uterus is in situ, pt advised that she should continue with pap smear screening per routine until age 79 if she continues with negative/low grade paps. Reviewed that all postmenopausal bleeding/spotting should be evaluated given uterus in situ. - Okay to return to routine care.  RTC prn.  Derrel Flies, MD Gynecologic Oncology   ----------------------- Reason for Visit: Postop  Interval History: Pt reports that she is recovering well from surgery. She is using her baseline oxycodone  for pain. She is eating and drinking well. She is voiding without issue and having regular bowel movements.    Past Medical/Surgical History: Past Medical History:  Diagnosis Date   Allergy    pollen, pcn   Anxiety    Arthritis    Asthma    Bipolar disorder (HCC)    Breast cancer (HCC)    Cataract    Depression    Fibroids    Glaucoma    History of ovarian cyst    Hypertension    Menometrorrhagia    Ovarian cancer (HCC)    Sickle cell trait (HCC)     Past Surgical History:  Procedure Laterality Date   BREAST BIOPSY Left 12/05/2023   MM LT BREAST BX W LOC DEV 1ST LESION IMAGE BX SPEC STEREO GUIDE 12/05/2023 [facility]   BREAST BIOPSY  01/08/2024   MM LT RADIOACTIVE SEED LOC MAMMO GUIDE 01/08/2024 [facility]   BREAST LUMPECTOMY WITH RADIOACTIVE SEED LOCALIZATION Left 01/09/2024   Procedure: BREAST LUMPECTOMY WITH RADIOACTIVE SEED  LOCALIZATION;  Surgeon: Lockie Rima, MD;  Location: [facility];  Service: General;  Laterality: Left;  LEFT BREAST SEED LOCALIZED LUMPECTOMY   CATARACT EXTRACTION, BILATERAL     CESAREAN SECTION     all three pregnancies   COLONOSCOPY  07/04/2021   ROBOTIC ASSISTED BILATERAL SALPINGO OOPHERECTOMY Bilateral 02/04/2024   Procedure: ROBOTIC ASSISTED BILATERAL SALPINGO-OOPHORECTOMY, MINI-LAPAROTOMY FOR SPECIMEN RETRIEVAL;  Surgeon: Derrel Flies, MD;  Location: [facility];  Service: Gynecology;  Laterality: Bilateral;  POSSBILE STAGING; POSSIBLE LAPAROTOMY    Family History  Problem Relation Age of Onset   Breast cancer Mother        dx 52s; mets   Hypertension Mother    Heart disease Father    Throat cancer Father        d. 55   Lung cancer Maternal Uncle        x3 mat uncles   Breast cancer Maternal Grandmother        dx >50; mets to brain   Lung cancer Maternal Grandfather    Breast cancer Paternal Grandmother 55   Lung cancer Paternal Grandfather    Ovarian cancer Other        MGM's sister   Other Neg Hx    Esophageal cancer Neg Hx    Rectal cancer Neg Hx    Stomach cancer Neg Hx    Prostate cancer Neg Hx    Pancreatic cancer Neg Hx    Endometrial cancer Neg Hx     Social History  Socioeconomic History   Marital status: Divorced    Spouse name: Not on file   Number of children: 3   Years of education: Not on file   Highest education level: 12th grade  Occupational History   Not on file  Tobacco Use   Smoking status: Former    Current packs/day: 0.00    Types: Cigarettes    Quit date: 02/14/2016    Years since quitting: 8.0    Passive exposure: Never   Smokeless tobacco: Never   Tobacco comments:    3 cigarettes daily 06-22-21  Vaping Use   Vaping status: Never Used  Substance and Sexual Activity   Alcohol  use: Yes    Comment: socially   Drug use: No   Sexual activity: Yes    Partners: Male    Birth control/protection: None  Other Topics Concern   Not on file   Social History Narrative   Not on file   Social Drivers of Health   Financial Resource Strain: Medium Risk (01/17/2024)   Overall Financial Resource Strain (CARDIA)    Difficulty of Paying Living Expenses: Somewhat hard  Food Insecurity: No Food Insecurity (02/05/2024)   Hunger Vital Sign    Worried About Running Out of Food in the Last Year: Never true    Ran Out of Food in the Last Year: Never true  Transportation Needs: No Transportation Needs (02/05/2024)   PRAPARE - Administrator, Civil Service (Medical): No    Lack of Transportation (Non-Medical): No  Physical Activity: Insufficiently Active (01/17/2024)   Exercise Vital Sign    Days of Exercise per Week: 2 days    Minutes of Exercise per Session: 10 min  Stress: Stress Concern Present (01/17/2024)   [employer] of Occupational Health - Occupational Stress Questionnaire    Feeling of Stress : To some extent  Social Connections: Moderately Integrated (01/17/2024)   Social Connection and Isolation Panel [NHANES]    Frequency of Communication with Friends and Family: Twice a week    Frequency of Social Gatherings with Friends and Family: Once a week    Attends Religious Services: 1 to 4 times per year    Active Member of [employer] or Organizations: Patient declined    Attends Engineer, structural: 1 to 4 times per year    Marital Status: Divorced    Current Medications:  Current Outpatient Medications:    albuterol  (PROAIR  HFA) 108 (90 Base) MCG/ACT inhaler, Inhale 2 puffs into the lungs every 6 (six) hours as needed for wheezing or shortness of breath., Disp: 18 g, Rfl: 2   brimonidine-timolol (COMBIGAN) 0.2-0.5 % ophthalmic solution, Place 1 drop into both eyes every 12 (twelve) hours., Disp: , Rfl:    busPIRone  (BUSPAR ) 15 MG tablet, Take 1 tablet by mouth 2 times per day (Patient taking differently: Take 15 mg by mouth in the morning.), Disp: 60 tablet, Rfl: 2   cariprazine  (VRAYLAR ) 1.5 MG capsule, Take 2  capsules by mouth daily. (Patient taking differently: Take 1.5 mg by mouth every evening.), Disp: 60 capsule, Rfl: 2   cholecalciferol (VITAMIN D3) 25 MCG (1000 UNIT) tablet, Take 1,000 Units by mouth in the morning., Disp: , Rfl:    cloNIDine  (CATAPRES ) 0.2 MG tablet, Take 1 tablet (0.2 mg total) by mouth at bedtime., Disp: 90 tablet, Rfl: 1   cyclobenzaprine  (FLEXERIL ) 10 MG tablet, Take 1 tablet (10 mg total) by mouth 2 (two) times daily as needed for muscle spasms, Disp: 90 tablet,  Rfl: 1   DULoxetine  (CYMBALTA ) 60 MG capsule, TAKE 1 CAPSULE (60 MG TOTAL) BY MOUTH DAILY., Disp: 30 capsule, Rfl: 6   FLUoxetine  (PROZAC ) 40 MG capsule, Take 40 mg by mouth in the morning., Disp: , Rfl:    fluticasone -salmeterol (ADVAIR  DISKUS) 500-50 MCG/ACT AEPB, Inhale 1 puff into the lungs 2 (two) times daily., Disp: 180 each, Rfl: 1   gabapentin  (NEURONTIN ) 300 MG capsule, Take 2 capsules (600 mg total) by mouth 2 (two) times daily., Disp: 120 capsule, Rfl: 6   lactulose , encephalopathy, (CHRONULAC ) 10 GM/15ML SOLN, Take 15 mLs (10 g total) by mouth 2 (two) times daily as needed for mild constipation. (Patient taking differently: Take 6.6667 g by mouth in the morning.), Disp: 946 mL, Rfl: 1   levocetirizine (XYZAL ) 5 MG tablet, Take 1 tablet (5 mg total) by mouth every evening. (Patient taking differently: Take 5 mg by mouth in the morning.), Disp: 30 tablet, Rfl: 2   lidocaine  (LIDODERM ) 5 %, Place 1 patch onto the skin daily. Remove & Discard patch within 12 hours or as directed by MD (Patient taking differently: Place 1 patch onto the skin daily as needed (pain.).), Disp: 30 patch, Rfl: 3   LINZESS 145 MCG CAPS capsule, Take 145 mcg by mouth daily., Disp: , Rfl:    meloxicam  (MOBIC ) 7.5 MG tablet, Take 1 tablet (7.5 mg total) by mouth daily as needed. (Patient taking differently: Take 7.5 mg by mouth in the morning.), Disp: 90 tablet, Rfl: 1   Misc. Devices MISC, 4-pronged cane.  Diagnosis-spinal stenosis, Disp:  1 each, Rfl: 0   Misc. Devices MISC, Rollator walker with seat.  Diagnosis spinal stenosis, Disp: 1 each, Rfl: 0   naloxone (NARCAN) nasal spray 4 mg/0.1 mL, 1 (ONE) SPRAY BY NOSE AS NEEDED, IF FOUND UNRESPONSIVE THEN SPRAY INTO NOSE AND CALL 911, Disp: , Rfl:    oxyCODONE -acetaminophen  (PERCOCET) 10-325 MG tablet, Take 1 tablet by mouth in the morning, at noon, in the evening, and at bedtime., Disp: , Rfl:    prazosin  (MINIPRESS ) 1 MG capsule, Take 1 capsule by mouth at bedtime for nightmares, Disp: 30 capsule, Rfl: 2   traZODone  (DESYREL ) 50 MG tablet, TAKE 1 TABLET (50 MG TOTAL) BY MOUTH AT BEDTIME AS NEEDED. FOR SLEEP (Patient taking differently: Take 50 mg by mouth at bedtime.), Disp: 30 tablet, Rfl: 6   triamcinolone  cream (KENALOG ) 0.1 %, Apply 1 application topically daily to arm for eczema, Disp: 15 g, Rfl: 0   triamterene-hydrochlorothiazide (MAXZIDE-25) 37.5-25 MG tablet, Take 1 tablet by mouth in the morning., Disp: , Rfl:   Review of Symptoms: Complete 10-system review is positive for: Shortness of breath, back pain, anxiety, depression, cough, leg swelling, hot flashes, problems walking  Physical Exam: BP 117/65 (BP Location: Left Arm, Patient Position: Sitting)   Pulse 62   Temp 98.9 F (37.2 C) (Oral)   Resp 17   Ht 5\' 1"  (1.549 m)   Wt 207 lb 6.4 oz (94.1 kg)   LMP 03/28/2016   SpO2 100%   BMI 39.19 kg/m  General: Alert, oriented, no acute distress. HEENT: Normocephalic, atraumatic. Neck symmetric without masses. Sclera anicteric.  Chest: Normal work of breathing.  Abdomen: Soft, nontender.  Normoactive bowel sounds.  No masses appreciated.  Well-healing incisions. Extremities: Grossly normal range of motion.  Warm, well perfused.    Laboratory & Radiologic Studies: Surgical pathology (02/04/24): A. 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 FALLOPIAN TUBE, OVARY, LEFT, SALPINGO-OOPHORECTOMY:  - Benign unremarkable ovary  - Segment of fallopian tube with complete cross section  - No evidence of malignancy   Cytology (02/04/24): FINAL MICROSCOPIC DIAGNOSIS:  - No malignant cells identified

## 2024-02-25 ENCOUNTER — Other Ambulatory Visit: Payer: Self-pay

## 2024-02-25 ENCOUNTER — Ambulatory Visit
Admission: RE | Admit: 2024-02-25 | Discharge: 2024-02-25 | Disposition: A | Discharge status: Home / Self Care | Payer: MEDICAID | Source: Ambulatory Visit | Attending: Radiation Oncology | Admitting: Radiation Oncology

## 2024-02-25 DIAGNOSIS — D0512 Intraductal carcinoma in situ of left breast: Secondary | ICD-10-CM | POA: Diagnosis not present

## 2024-02-25 LAB — RAD ONC ARIA SESSION SUMMARY
Course Elapsed Days: 7
Plan Fractions Treated to Date: 6
Plan Prescribed Dose Per Fraction: 2.66 Gy
Plan Total Fractions Prescribed: 16
Plan Total Prescribed Dose: 42.56 Gy
Reference Point Dosage Given to Date: 15.96 Gy
Reference Point Session Dosage Given: 2.66 Gy
Session Number: 6

## 2024-02-26 ENCOUNTER — Other Ambulatory Visit: Payer: Self-pay

## 2024-02-26 ENCOUNTER — Ambulatory Visit
Admission: RE | Admit: 2024-02-26 | Discharge: 2024-02-26 | Disposition: A | Discharge status: Home / Self Care | Payer: MEDICAID | Source: Ambulatory Visit | Attending: Radiation Oncology | Admitting: Radiation Oncology

## 2024-02-26 DIAGNOSIS — D0512 Intraductal carcinoma in situ of left breast: Secondary | ICD-10-CM | POA: Diagnosis not present

## 2024-02-26 LAB — RAD ONC ARIA SESSION SUMMARY
Course Elapsed Days: 8
Plan Fractions Treated to Date: 7
Plan Prescribed Dose Per Fraction: 2.66 Gy
Plan Total Fractions Prescribed: 16
Plan Total Prescribed Dose: 42.56 Gy
Reference Point Dosage Given to Date: 18.62 Gy
Reference Point Session Dosage Given: 2.66 Gy
Session Number: 7

## 2024-02-27 ENCOUNTER — Other Ambulatory Visit: Payer: Self-pay

## 2024-02-27 ENCOUNTER — Ambulatory Visit
Admission: RE | Admit: 2024-02-27 | Discharge: 2024-02-27 | Disposition: A | Discharge status: Home / Self Care | Payer: MEDICAID | Source: Ambulatory Visit | Attending: Radiation Oncology | Admitting: Radiation Oncology

## 2024-02-27 DIAGNOSIS — D0512 Intraductal carcinoma in situ of left breast: Secondary | ICD-10-CM | POA: Diagnosis not present

## 2024-02-27 LAB — RAD ONC ARIA SESSION SUMMARY
Course Elapsed Days: 9
Plan Fractions Treated to Date: 8
Plan Prescribed Dose Per Fraction: 2.66 Gy
Plan Total Fractions Prescribed: 16
Plan Total Prescribed Dose: 42.56 Gy
Reference Point Dosage Given to Date: 21.28 Gy
Reference Point Session Dosage Given: 2.66 Gy
Session Number: 8

## 2024-02-28 ENCOUNTER — Ambulatory Visit
Admission: RE | Admit: 2024-02-28 | Discharge: 2024-02-28 | Disposition: A | Discharge status: Home / Self Care | Payer: MEDICAID | Source: Ambulatory Visit | Attending: Radiation Oncology | Admitting: Radiation Oncology

## 2024-02-28 ENCOUNTER — Other Ambulatory Visit: Payer: Self-pay

## 2024-02-28 DIAGNOSIS — D0512 Intraductal carcinoma in situ of left breast: Secondary | ICD-10-CM | POA: Diagnosis not present

## 2024-02-28 LAB — RAD ONC ARIA SESSION SUMMARY
Course Elapsed Days: 10
Plan Fractions Treated to Date: 9
Plan Prescribed Dose Per Fraction: 2.66 Gy
Plan Total Fractions Prescribed: 16
Plan Total Prescribed Dose: 42.56 Gy
Reference Point Dosage Given to Date: 23.94 Gy
Reference Point Session Dosage Given: 2.66 Gy
Session Number: 9

## 2024-03-02 ENCOUNTER — Ambulatory Visit
Admission: RE | Admit: 2024-03-02 | Discharge: 2024-03-02 | Disposition: A | Discharge status: Home / Self Care | Payer: MEDICAID | Source: Ambulatory Visit | Attending: Radiation Oncology | Admitting: Radiation Oncology

## 2024-03-02 ENCOUNTER — Other Ambulatory Visit: Payer: Self-pay

## 2024-03-02 DIAGNOSIS — D0512 Intraductal carcinoma in situ of left breast: Secondary | ICD-10-CM | POA: Diagnosis not present

## 2024-03-02 LAB — RAD ONC ARIA SESSION SUMMARY
Course Elapsed Days: 13
Plan Fractions Treated to Date: 10
Plan Prescribed Dose Per Fraction: 2.66 Gy
Plan Total Fractions Prescribed: 16
Plan Total Prescribed Dose: 42.56 Gy
Reference Point Dosage Given to Date: 26.6 Gy
Reference Point Session Dosage Given: 2.66 Gy
Session Number: 10

## 2024-03-03 ENCOUNTER — Other Ambulatory Visit: Payer: Self-pay

## 2024-03-03 ENCOUNTER — Ambulatory Visit
Admission: RE | Admit: 2024-03-03 | Discharge: 2024-03-03 | Disposition: A | Discharge status: Home / Self Care | Payer: MEDICAID | Source: Ambulatory Visit | Attending: Radiation Oncology | Admitting: Radiation Oncology

## 2024-03-03 DIAGNOSIS — D0512 Intraductal carcinoma in situ of left breast: Secondary | ICD-10-CM | POA: Diagnosis not present

## 2024-03-03 LAB — RAD ONC ARIA SESSION SUMMARY
Course Elapsed Days: 14
Plan Fractions Treated to Date: 11
Plan Prescribed Dose Per Fraction: 2.66 Gy
Plan Total Fractions Prescribed: 16
Plan Total Prescribed Dose: 42.56 Gy
Reference Point Dosage Given to Date: 29.26 Gy
Reference Point Session Dosage Given: 2.66 Gy
Session Number: 11

## 2024-03-04 ENCOUNTER — Other Ambulatory Visit: Payer: Self-pay

## 2024-03-04 ENCOUNTER — Ambulatory Visit
Admission: RE | Admit: 2024-03-04 | Discharge: 2024-03-04 | Disposition: A | Discharge status: Home / Self Care | Payer: MEDICAID | Source: Ambulatory Visit | Attending: Radiation Oncology | Admitting: Radiation Oncology

## 2024-03-04 DIAGNOSIS — D0512 Intraductal carcinoma in situ of left breast: Secondary | ICD-10-CM | POA: Diagnosis not present

## 2024-03-04 LAB — RAD ONC ARIA SESSION SUMMARY
Course Elapsed Days: 15
Plan Fractions Treated to Date: 12
Plan Prescribed Dose Per Fraction: 2.66 Gy
Plan Total Fractions Prescribed: 16
Plan Total Prescribed Dose: 42.56 Gy
Reference Point Dosage Given to Date: 31.92 Gy
Reference Point Session Dosage Given: 2.66 Gy
Session Number: 12

## 2024-03-05 ENCOUNTER — Telehealth: Payer: Self-pay

## 2024-03-05 ENCOUNTER — Ambulatory Visit: Payer: MEDICAID

## 2024-03-05 NOTE — Telephone Encounter (Signed)
 Verbally confirmed appt for 6/20

## 2024-03-06 ENCOUNTER — Ambulatory Visit
Admission: RE | Admit: 2024-03-06 | Discharge: 2024-03-06 | Disposition: A | Discharge status: Home / Self Care | Payer: MEDICAID | Source: Ambulatory Visit | Attending: Radiation Oncology | Admitting: Radiation Oncology

## 2024-03-06 ENCOUNTER — Other Ambulatory Visit: Payer: Self-pay

## 2024-03-06 ENCOUNTER — Inpatient Hospital Stay (HOSPITAL_BASED_OUTPATIENT_CLINIC_OR_DEPARTMENT_OTHER): Payer: MEDICAID | Admitting: Hematology and Oncology

## 2024-03-06 VITALS — BP 122/68 | HR 69 | Temp 98.3°F | Resp 18 | Wt 211.0 lb

## 2024-03-06 DIAGNOSIS — D0512 Intraductal carcinoma in situ of left breast: Secondary | ICD-10-CM | POA: Diagnosis not present

## 2024-03-06 LAB — RAD ONC ARIA SESSION SUMMARY
Course Elapsed Days: 17
Plan Fractions Treated to Date: 13
Plan Prescribed Dose Per Fraction: 2.66 Gy
Plan Total Fractions Prescribed: 16
Plan Total Prescribed Dose: 42.56 Gy
Reference Point Dosage Given to Date: 34.58 Gy
Reference Point Session Dosage Given: 2.66 Gy
Session Number: 13

## 2024-03-06 MED ORDER — ANASTROZOLE 1 MG PO TABS
1.0000 mg | ORAL_TABLET | Freq: Every day | ORAL | 3 refills | Status: AC
Start: 2024-03-06 — End: ?

## 2024-03-06 NOTE — Progress Notes (Signed)
 [facility] CONSULT NOTE  Patient Care Team: Joaquin Mulberry, MD as PCP - General (Family Medicine) Auther Bo, RN (Inactive) as Oncology Nurse Navigator Alane Hsu, RN as Oncology Nurse Navigator Murleen Arms, MD as Consulting Physician (Hematology and Oncology)  CHIEF COMPLAINTS/PURPOSE OF CONSULTATION:  Newly diagnosed breast cancer  HISTORY OF PRESENTING ILLNESS:  [name] 53 y.o. female is here because of recent diagnosis of left breast cancer  I reviewed her records extensively and collaborated the history with the patient.  SUMMARY OF ONCOLOGIC HISTORY: Oncology History  Ductal carcinoma in situ (DCIS) of left breast  11/08/2023 Mammogram   In the left breast, calcifications warrant further evaluation. In the right breast, no findings suspicious for malignancy.   12/05/2023 Pathology Results   High grade DCIS, solid and clinging types with necrosis and calcs. Neg for invasive carcinoma. Insufficienct residual tumor within the deeper prognostic marker immunohistochemical stains to determine the hormone status of the tumor.   12/24/2023 Initial Diagnosis   Ductal carcinoma in situ (DCIS) of left breast   01/10/2024 Cancer Staging   Staging form: Breast, AJCC 8th Edition - Clinical stage from 01/10/2024: Stage Unknown (cTis (DCIS), cNX, ER: Unknown, PR: Unknown, HER2: Unknown) - Signed by Murleen Arms, MD on 01/10/2024 Stage prefix: Initial diagnosis   01/23/2024 Genetic Testing   Negative [facility] CancerNext-Expanded +RNAinsight Panel.  Report date is 01/23/2024.  The CancerNext-Expanded gene panel offered by [facility] and includes sequencing, rearrangement, and RNA analysis for the following 77 genes: AIP, ALK, APC, ATM, AXIN2, BAP1, BARD1, BMPR1A, BRCA1, BRCA2, BRIP1, CDC73, CDH1, CDK4, CDKN1B, CDKN2A, CEBPA, CHEK2, CTNNA1, DDX41, DICER1, ETV6, FH, FLCN, GATA2, LZTR1, MAX, MBD4, MEN1, MET, MLH1, MSH2, MSH3, MSH6, MUTYH, NF1, NF2, NTHL1, PALB2,  PHOX2B, PMS2, POT1, PRKAR1A, PTCH1, PTEN, RAD51C, RAD51D, RB1, RET, RUNX1, RSP20, SDHA, SDHAF2, SDHB, SDHC, SDHD, SMAD4, SMARCA4, SMARCB1, SMARCE1, STK11, SUFU, TMEM127, TP53, TSC1, TSC2, VHL, and WT1 (sequencing and deletion/duplication); EGFR, HOXB13, KIT, MITF, PDGFRA, POLD1, and POLE (sequencing only); EPCAM and GREM1 (deletion/duplication only).     She is scheduled for surgery on 02/04/2024.  Discussed the use of AI scribe software for clinical note transcription with the patient, who gave verbal consent to proceed.  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  She underwent a salpingo-oophorectomy on January 09, 2024, for a benign ovarian tumor.She is now undergoing adj radiation for her breast cancer.  Pathology showed estrogen receptor positive, with 60% of the tumor staining for estrogen. The breast cancer was completely removed. She has not had a bone density scan before.  Rest of the pertinent 10 point ROS reviewed and neg.   MEDICAL HISTORY:  Past Medical History:  Diagnosis Date   Allergy    pollen, pcn   Anxiety    Arthritis    Asthma    Bipolar disorder (HCC)    Breast cancer (HCC)    Cataract    Depression    Fibroids    Glaucoma    History of ovarian cyst    Hypertension    Menometrorrhagia    Ovarian cancer (HCC)    Sickle cell trait (HCC)     SURGICAL HISTORY: Past Surgical History:  Procedure Laterality Date   BREAST BIOPSY Left 12/05/2023   MM LT BREAST BX W LOC DEV 1ST LESION IMAGE BX SPEC STEREO GUIDE 12/05/2023 [facility]  BREAST BIOPSY  01/08/2024   MM LT RADIOACTIVE SEED LOC MAMMO GUIDE 01/08/2024 [facility]   BREAST LUMPECTOMY WITH RADIOACTIVE SEED LOCALIZATION Left 01/09/2024   Procedure: BREAST LUMPECTOMY WITH RADIOACTIVE SEED LOCALIZATION;  Surgeon: Lockie Rima, MD;  Location: [facility];  Service: General;   Laterality: Left;  LEFT BREAST SEED LOCALIZED LUMPECTOMY   CATARACT EXTRACTION, BILATERAL     CESAREAN SECTION     all three pregnancies   COLONOSCOPY  07/04/2021   ROBOTIC ASSISTED BILATERAL SALPINGO OOPHERECTOMY Bilateral 02/04/2024   Procedure: ROBOTIC ASSISTED BILATERAL SALPINGO-OOPHORECTOMY, MINI-LAPAROTOMY FOR SPECIMEN RETRIEVAL;  Surgeon: Derrel Flies, MD;  Location: [facility];  Service: Gynecology;  Laterality: Bilateral;  POSSBILE STAGING; POSSIBLE LAPAROTOMY    SOCIAL HISTORY: Social History   Socioeconomic History   Marital status: Divorced    Spouse name: Not on file   Number of children: 3   Years of education: Not on file   Highest education level: 12th grade  Occupational History   Not on file  Tobacco Use   Smoking status: Former    Current packs/day: 0.00    Types: Cigarettes    Quit date: 02/14/2016    Years since quitting: 8.0    Passive exposure: Never   Smokeless tobacco: Never   Tobacco comments:    3 cigarettes daily 06-22-21  Vaping Use   Vaping status: Never Used  Substance and Sexual Activity   Alcohol  use: Yes    Comment: socially   Drug use: No   Sexual activity: Yes    Partners: Male    Birth control/protection: None  Other Topics Concern   Not on file  Social History Narrative   Not on file   Social Drivers of Health   Financial Resource Strain: Medium Risk (01/17/2024)   Overall Financial Resource Strain (CARDIA)    Difficulty of Paying Living Expenses: Somewhat hard  Food Insecurity: No Food Insecurity (02/05/2024)   Hunger Vital Sign    Worried About Running Out of Food in the Last Year: Never true    Ran Out of Food in the Last Year: Never true  Transportation Needs: No Transportation Needs (02/05/2024)   PRAPARE - Administrator, Civil Service (Medical): No    Lack of Transportation (Non-Medical): No  Physical Activity: Insufficiently Active (01/17/2024)   Exercise Vital Sign    Days of Exercise per Week: 2 days     Minutes of Exercise per Session: 10 min  Stress: Stress Concern Present (01/17/2024)   [employer] of Occupational Health - Occupational Stress Questionnaire    Feeling of Stress : To some extent  Social Connections: Moderately Integrated (01/17/2024)   Social Connection and Isolation Panel    Frequency of Communication with Friends and Family: Twice a week    Frequency of Social Gatherings with Friends and Family: Once a week    Attends Religious Services: 1 to 4 times per year    Active Member of [employer] or Organizations: Patient declined    Attends Banker Meetings: 1 to 4 times per year    Marital Status: Divorced  Intimate Partner Violence: Not At Risk (02/05/2024)   Humiliation, Afraid, Rape, and Kick questionnaire    Fear of Current or Ex-Partner: No    Emotionally Abused: No    Physically Abused: No    Sexually Abused: No    FAMILY HISTORY: Family History  Problem Relation Age of Onset   Breast cancer Mother        dx  50s; mets   Hypertension Mother    Heart disease Father    Throat cancer Father        d. 67   Lung cancer Maternal Uncle        x3 mat uncles   Breast cancer Maternal Grandmother        dx >50; mets to brain   Lung cancer Maternal Grandfather    Breast cancer Paternal Grandmother 60   Lung cancer Paternal Grandfather    Ovarian cancer Other        MGM's sister   Other Neg Hx    Esophageal cancer Neg Hx    Rectal cancer Neg Hx    Stomach cancer Neg Hx    Prostate cancer Neg Hx    Pancreatic cancer Neg Hx    Endometrial cancer Neg Hx     ALLERGIES:  is allergic to bupropion hcl er (sr), fruit & vegetable daily [nutritional supplements], penicillins, pollen extract-tree extract, and other.  MEDICATIONS:  Current Outpatient Medications  Medication Sig Dispense Refill   anastrozole (ARIMIDEX) 1 MG tablet Take 1 tablet (1 mg total) by mouth daily. 90 tablet 3   albuterol  (PROAIR  HFA) 108 (90 Base) MCG/ACT inhaler Inhale 2 puffs into  the lungs every 6 (six) hours as needed for wheezing or shortness of breath. 18 g 2   brimonidine-timolol (COMBIGAN) 0.2-0.5 % ophthalmic solution Place 1 drop into both eyes every 12 (twelve) hours.     busPIRone  (BUSPAR ) 15 MG tablet Take 1 tablet by mouth 2 times per day (Patient taking differently: Take 15 mg by mouth in the morning.) 60 tablet 2   cariprazine  (VRAYLAR ) 1.5 MG capsule Take 2 capsules by mouth daily. (Patient taking differently: Take 1.5 mg by mouth every evening.) 60 capsule 2   cholecalciferol (VITAMIN D3) 25 MCG (1000 UNIT) tablet Take 1,000 Units by mouth in the morning.     cloNIDine  (CATAPRES ) 0.2 MG tablet Take 1 tablet (0.2 mg total) by mouth at bedtime. 90 tablet 1   cyclobenzaprine  (FLEXERIL ) 10 MG tablet Take 1 tablet (10 mg total) by mouth 2 (two) times daily as needed for muscle spasms 90 tablet 1   DULoxetine  (CYMBALTA ) 60 MG capsule TAKE 1 CAPSULE (60 MG TOTAL) BY MOUTH DAILY. 30 capsule 6   FLUoxetine  (PROZAC ) 40 MG capsule Take 40 mg by mouth in the morning.     fluticasone -salmeterol (ADVAIR  DISKUS) 500-50 MCG/ACT AEPB Inhale 1 puff into the lungs 2 (two) times daily. 180 each 1   gabapentin  (NEURONTIN ) 300 MG capsule Take 2 capsules (600 mg total) by mouth 2 (two) times daily. 120 capsule 6   lactulose , encephalopathy, (CHRONULAC ) 10 GM/15ML SOLN Take 15 mLs (10 g total) by mouth 2 (two) times daily as needed for mild constipation. (Patient taking differently: Take 6.6667 g by mouth in the morning.) 946 mL 1   levocetirizine (XYZAL ) 5 MG tablet Take 1 tablet (5 mg total) by mouth every evening. (Patient taking differently: Take 5 mg by mouth in the morning.) 30 tablet 2   lidocaine  (LIDODERM ) 5 % Place 1 patch onto the skin daily. Remove & Discard patch within 12 hours or as directed by MD (Patient taking differently: Place 1 patch onto the skin daily as needed (pain.).) 30 patch 3   LINZESS 145 MCG CAPS capsule Take 145 mcg by mouth daily.     meloxicam  (MOBIC )  7.5 MG tablet Take 1 tablet (7.5 mg total) by mouth daily as needed. (Patient taking differently:  Take 7.5 mg by mouth in the morning.) 90 tablet 1   Misc. Devices MISC 4-pronged cane.  Diagnosis-spinal stenosis 1 each 0   Misc. Devices MISC Rollator walker with seat.  Diagnosis spinal stenosis 1 each 0   naloxone (NARCAN) nasal spray 4 mg/0.1 mL 1 (ONE) SPRAY BY NOSE AS NEEDED, IF FOUND UNRESPONSIVE THEN SPRAY INTO NOSE AND CALL 911     oxyCODONE -acetaminophen  (PERCOCET) 10-325 MG tablet Take 1 tablet by mouth in the morning, at noon, in the evening, and at bedtime.     prazosin  (MINIPRESS ) 1 MG capsule Take 1 capsule by mouth at bedtime for nightmares 30 capsule 2   traZODone  (DESYREL ) 50 MG tablet TAKE 1 TABLET (50 MG TOTAL) BY MOUTH AT BEDTIME AS NEEDED. FOR SLEEP (Patient taking differently: Take 50 mg by mouth at bedtime.) 30 tablet 6   triamcinolone  cream (KENALOG ) 0.1 % Apply 1 application topically daily to arm for eczema 15 g 0   triamterene-hydrochlorothiazide (MAXZIDE-25) 37.5-25 MG tablet Take 1 tablet by mouth in the morning.     No current facility-administered medications for this visit.    REVIEW OF SYSTEMS:   Constitutional: Denies fevers, chills or abnormal night sweats Eyes: Denies blurriness of vision, double vision or watery eyes Ears, nose, mouth, throat, and face: Denies mucositis or sore throat Respiratory: Denies cough, dyspnea or wheezes Cardiovascular: Denies palpitation, chest discomfort or lower extremity swelling Gastrointestinal:  Denies nausea, heartburn or change in bowel habits Skin: Denies abnormal skin rashes Lymphatics: Denies new lymphadenopathy or easy bruising Neurological:Denies numbness, tingling or new weaknesses Behavioral/Psych: Mood is stable, no new changes  Breast: Denies any palpable lumps or discharge All other systems were reviewed with the patient and are negative.  PHYSICAL EXAMINATION: ECOG PERFORMANCE STATUS: 0 -  Asymptomatic  Vitals:   03/06/24 1235  BP: 122/68  Pulse: 69  Resp: 18  Temp: 98.3 F (36.8 C)  SpO2: 97%    Filed Weights   03/06/24 1235  Weight: 211 lb (95.7 kg)    GENERAL:alert, no distress and comfortable No palpable lymphadenopathy cervical Left breast surgical scar healing well CTA bilaterally  Walks with a cane No LE edema  LABORATORY DATA:  I have reviewed the data as listed Lab Results  Component Value Date   WBC 9.6 01/27/2024   HGB 12.2 01/27/2024   HCT 38.3 01/27/2024   MCV 80.3 01/27/2024   PLT 381 01/27/2024   Lab Results  Component Value Date   NA 140 01/27/2024   K 3.5 01/27/2024   CL 98 01/27/2024   CO2 31 01/27/2024    RADIOGRAPHIC STUDIES: I have personally reviewed the radiological reports and agreed with the findings in the report.  ASSESSMENT AND PLAN:  Ductal carcinoma in situ (DCIS) of left breast This is a very pleasant 53 yr old female with DCIS, ER PR positive here for recommendations. Assessment & Plan  Assessment & Plan Ductal carcinoma in situ of breast Stage 0 DCIS diagnosed post-abnormal mammogram. ER 60% W- M staining, PR neg. She is now post lumpectomy and is on adj radiation. We discussed about anti estrogen therapy with aromatase inhibitors given recent bilateral oophorectomy. With regards to aromatase inhibitors, we discussed mechanism of action, adverse effects including but not limited to post menopausal symptoms, arthralgias, myalgias, increased risk of cardiovascular events and bone loss.  She is agreeable to start this after completing radiation She will come back in 3 months for SCP.      All questions were  answered. The patient knows to call the clinic with any problems, questions or concerns.    Murleen Arms, MD 03/06/24

## 2024-03-06 NOTE — Assessment & Plan Note (Signed)
 This is a very pleasant 53 yr old female with DCIS, ER PR positive here for recommendations. Assessment & Plan  Assessment & Plan Ductal carcinoma in situ of breast Stage 0 DCIS diagnosed post-abnormal mammogram. ER 60% W- M staining, PR neg. She is now post lumpectomy and is on adj radiation. We discussed about anti estrogen therapy with aromatase inhibitors given recent bilateral oophorectomy. With regards to aromatase inhibitors, we discussed mechanism of action, adverse effects including but not limited to post menopausal symptoms, arthralgias, myalgias, increased risk of cardiovascular events and bone loss.  She is agreeable to start this after completing radiation She will come back in 3 months for SCP.

## 2024-03-09 ENCOUNTER — Ambulatory Visit
Admission: RE | Admit: 2024-03-09 | Discharge: 2024-03-09 | Disposition: A | Discharge status: Home / Self Care | Payer: MEDICAID | Source: Ambulatory Visit | Attending: Radiation Oncology | Admitting: Radiation Oncology

## 2024-03-09 ENCOUNTER — Other Ambulatory Visit: Payer: Self-pay

## 2024-03-09 DIAGNOSIS — D0512 Intraductal carcinoma in situ of left breast: Secondary | ICD-10-CM | POA: Diagnosis not present

## 2024-03-09 LAB — RAD ONC ARIA SESSION SUMMARY
Course Elapsed Days: 20
Plan Fractions Treated to Date: 14
Plan Prescribed Dose Per Fraction: 2.66 Gy
Plan Total Fractions Prescribed: 16
Plan Total Prescribed Dose: 42.56 Gy
Reference Point Dosage Given to Date: 37.24 Gy
Reference Point Session Dosage Given: 2.66 Gy
Session Number: 14

## 2024-03-10 ENCOUNTER — Other Ambulatory Visit: Payer: Self-pay

## 2024-03-10 ENCOUNTER — Ambulatory Visit
Admission: RE | Admit: 2024-03-10 | Discharge: 2024-03-10 | Disposition: A | Discharge status: Home / Self Care | Payer: MEDICAID | Source: Ambulatory Visit | Attending: Radiation Oncology | Admitting: Radiation Oncology

## 2024-03-10 DIAGNOSIS — D0512 Intraductal carcinoma in situ of left breast: Secondary | ICD-10-CM | POA: Diagnosis not present

## 2024-03-10 LAB — RAD ONC ARIA SESSION SUMMARY
Course Elapsed Days: 21
Plan Fractions Treated to Date: 15
Plan Prescribed Dose Per Fraction: 2.66 Gy
Plan Total Fractions Prescribed: 16
Plan Total Prescribed Dose: 42.56 Gy
Reference Point Dosage Given to Date: 39.9 Gy
Reference Point Session Dosage Given: 2.66 Gy
Session Number: 15

## 2024-03-11 ENCOUNTER — Ambulatory Visit: Payer: MEDICAID

## 2024-03-11 ENCOUNTER — Ambulatory Visit
Admission: RE | Admit: 2024-03-11 | Discharge: 2024-03-11 | Disposition: A | Discharge status: Home / Self Care | Payer: MEDICAID | Source: Ambulatory Visit | Attending: Radiation Oncology | Admitting: Radiation Oncology

## 2024-03-11 ENCOUNTER — Other Ambulatory Visit: Payer: Self-pay

## 2024-03-11 DIAGNOSIS — D0512 Intraductal carcinoma in situ of left breast: Secondary | ICD-10-CM | POA: Diagnosis not present

## 2024-03-11 LAB — RAD ONC ARIA SESSION SUMMARY
Course Elapsed Days: 22
Plan Fractions Treated to Date: 16
Plan Prescribed Dose Per Fraction: 2.66 Gy
Plan Total Fractions Prescribed: 16
Plan Total Prescribed Dose: 42.56 Gy
Reference Point Dosage Given to Date: 42.56 Gy
Reference Point Session Dosage Given: 2.66 Gy
Session Number: 16

## 2024-03-12 ENCOUNTER — Other Ambulatory Visit: Payer: Self-pay

## 2024-03-12 ENCOUNTER — Ambulatory Visit: Payer: MEDICAID

## 2024-03-12 ENCOUNTER — Ambulatory Visit
Admission: RE | Admit: 2024-03-12 | Discharge: 2024-03-12 | Discharge status: Home / Self Care | Payer: MEDICAID | Source: Ambulatory Visit | Attending: Radiation Oncology

## 2024-03-12 DIAGNOSIS — D0512 Intraductal carcinoma in situ of left breast: Secondary | ICD-10-CM | POA: Diagnosis not present

## 2024-03-12 LAB — RAD ONC ARIA SESSION SUMMARY
Course Elapsed Days: 23
Plan Fractions Treated to Date: 1
Plan Prescribed Dose Per Fraction: 2 Gy
Plan Total Fractions Prescribed: 4
Plan Total Prescribed Dose: 8 Gy
Reference Point Dosage Given to Date: 2 Gy
Reference Point Session Dosage Given: 2 Gy
Session Number: 17

## 2024-03-13 ENCOUNTER — Ambulatory Visit: Payer: MEDICAID

## 2024-03-13 ENCOUNTER — Telehealth: Payer: Self-pay

## 2024-03-13 NOTE — Telephone Encounter (Signed)
 Notified [place] of completion of Disability Form. Copy of form placed for pick-up as requested. No other needs or concerns noted at this time.

## 2024-03-16 ENCOUNTER — Other Ambulatory Visit: Payer: Self-pay

## 2024-03-16 ENCOUNTER — Ambulatory Visit
Admission: RE | Admit: 2024-03-16 | Discharge: 2024-03-16 | Disposition: A | Discharge status: Home / Self Care | Payer: MEDICAID | Source: Ambulatory Visit | Attending: Radiation Oncology | Admitting: Radiation Oncology

## 2024-03-16 ENCOUNTER — Ambulatory Visit
Admission: RE | Admit: 2024-03-16 | Discharge: 2024-03-16 | Discharge status: Home / Self Care | Payer: MEDICAID | Source: Ambulatory Visit | Attending: Radiation Oncology

## 2024-03-16 ENCOUNTER — Ambulatory Visit: Payer: MEDICAID

## 2024-03-16 DIAGNOSIS — D0512 Intraductal carcinoma in situ of left breast: Secondary | ICD-10-CM

## 2024-03-16 LAB — RAD ONC ARIA SESSION SUMMARY
Course Elapsed Days: 27
Plan Fractions Treated to Date: 2
Plan Prescribed Dose Per Fraction: 2 Gy
Plan Total Fractions Prescribed: 4
Plan Total Prescribed Dose: 8 Gy
Reference Point Dosage Given to Date: 4 Gy
Reference Point Session Dosage Given: 2 Gy
Session Number: 18

## 2024-03-16 MED ORDER — RADIAPLEXRX EX GEL: Freq: Once | CUTANEOUS | Status: AC

## 2024-03-17 ENCOUNTER — Ambulatory Visit
Admission: RE | Admit: 2024-03-17 | Discharge: 2024-03-17 | Disposition: A | Discharge status: Home / Self Care | Payer: MEDICAID | Source: Ambulatory Visit | Attending: Radiation Oncology | Admitting: Radiation Oncology

## 2024-03-17 ENCOUNTER — Other Ambulatory Visit: Payer: Self-pay

## 2024-03-17 ENCOUNTER — Ambulatory Visit: Payer: MEDICAID

## 2024-03-17 DIAGNOSIS — D0512 Intraductal carcinoma in situ of left breast: Secondary | ICD-10-CM | POA: Diagnosis present

## 2024-03-17 LAB — RAD ONC ARIA SESSION SUMMARY
Course Elapsed Days: 28
Plan Fractions Treated to Date: 3
Plan Prescribed Dose Per Fraction: 2 Gy
Plan Total Fractions Prescribed: 4
Plan Total Prescribed Dose: 8 Gy
Reference Point Dosage Given to Date: 6 Gy
Reference Point Session Dosage Given: 2 Gy
Session Number: 19

## 2024-03-18 ENCOUNTER — Other Ambulatory Visit: Payer: Self-pay

## 2024-03-18 ENCOUNTER — Ambulatory Visit
Admission: RE | Admit: 2024-03-18 | Discharge: 2024-03-18 | Discharge status: Home / Self Care | Payer: MEDICAID | Source: Ambulatory Visit | Attending: Radiation Oncology

## 2024-03-18 ENCOUNTER — Inpatient Hospital Stay: Payer: MEDICAID | Attending: Licensed Clinical Social Worker | Admitting: Licensed Clinical Social Worker

## 2024-03-18 DIAGNOSIS — D0512 Intraductal carcinoma in situ of left breast: Secondary | ICD-10-CM | POA: Diagnosis not present

## 2024-03-18 LAB — RAD ONC ARIA SESSION SUMMARY
Course Elapsed Days: 29
Plan Fractions Treated to Date: 4
Plan Prescribed Dose Per Fraction: 2 Gy
Plan Total Fractions Prescribed: 4
Plan Total Prescribed Dose: 8 Gy
Reference Point Dosage Given to Date: 8 Gy
Reference Point Session Dosage Given: 2 Gy
Session Number: 20

## 2024-03-18 NOTE — Progress Notes (Signed)
 [facility] CSW Progress Note  Visual merchandiser received bill from pt for [employer] application.  Interventions: Completed application on pt's behalf for [employer] Pink Purse      Follow Up Plan:  Patient will be contacted directly by the foundation regarding assistance    Grey Rakestraw E Evolett Somarriba, LCSW Clinical Social Worker [facility]

## 2024-03-19 NOTE — Radiation Completion Notes (Addendum)
  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   Intent: Curative     ==========DELIVERED PLANS==========  First Treatment Date: 2024-02-18 Last Treatment Date: 2024-03-18   Plan Name: Breast_L_BH Site: Breast, Left Technique: 3D Mode: Photon Dose Per Fraction: 2.66 Gy Prescribed Dose (Delivered / Prescribed): 42.56 Gy / 42.56 Gy Prescribed Fxs (Delivered / Prescribed): 16 / 16   Plan Name: Breast_L_Bst Site: Breast, Left Technique: Electron Mode: Electron Dose Per Fraction: 2 Gy Prescribed Dose (Delivered / Prescribed): 8 Gy / 8 Gy Prescribed Fxs (Delivered / Prescribed): 4 / 4     ==========ON TREATMENT VISIT DATES========== 2024-02-21, 2024-02-28, 2024-03-06, 2024-03-16    See weekly On Treatment Notes in Epic for details in the Media tab (listed as Progress notes on the On Treatment Visit Dates listed above). The patient tolerated radiation. She developed fatigue and anticipated skin changes in the treatment field.   The patient will receive a call in about one month from the radiation oncology department. She will continue follow up with Dr. Loretha as well.      Donald KYM Husband, PAC

## 2024-03-23 ENCOUNTER — Other Ambulatory Visit: Payer: Self-pay | Admitting: Family Medicine

## 2024-03-23 DIAGNOSIS — J4520 Mild intermittent asthma, uncomplicated: Secondary | ICD-10-CM

## 2024-03-23 DIAGNOSIS — M48062 Spinal stenosis, lumbar region with neurogenic claudication: Secondary | ICD-10-CM

## 2024-03-23 DIAGNOSIS — M5010 Cervical disc disorder with radiculopathy, unspecified cervical region: Secondary | ICD-10-CM

## 2024-03-26 ENCOUNTER — Encounter: Payer: Self-pay | Admitting: Licensed Clinical Social Worker

## 2024-03-26 NOTE — Progress Notes (Signed)
 [facility] CSW Progress Note  Clinical Child psychotherapist received notice from [employer] that pt was approved for assistance. Pt was also notified.   Follow Up Plan:  Patient will contact CSW with any support or resource needs    Xochilth Standish E Saren Corkern, LCSW Clinical Social Worker [facility]

## 2024-04-03 ENCOUNTER — Encounter: Payer: Self-pay | Admitting: Advanced Practice Midwife

## 2024-04-20 ENCOUNTER — Ambulatory Visit
Admission: RE | Admit: 2024-04-20 | Discharge: 2024-04-20 | Disposition: A | Discharge status: Home / Self Care | Payer: MEDICAID | Source: Ambulatory Visit | Attending: Hematology and Oncology | Admitting: Hematology and Oncology

## 2024-04-20 DIAGNOSIS — D0512 Intraductal carcinoma in situ of left breast: Secondary | ICD-10-CM | POA: Insufficient documentation

## 2024-04-20 NOTE — Progress Notes (Signed)
  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  First Treatment Date: 2024-02-18 Last Treatment Date: 2024-03-18    The patient was available for call today.   Symptoms of fatigue have improved since completing therapy.  Symptoms of skin changes have improved since completing therapy.  Patient reports some soreness and still experiencing sharp pain to left breast.  The patient was encouraged to avoid sun exposure in the area of prior treatment for up to one year following radiation with either sunscreen or by the style of clothing worn in the sun.  The patient has scheduled follow up with her medical oncologist Dr. Loretha for ongoing surveillance, and was encouraged to call if she develops concerns or questions regarding radiation.    No vitals were taken for this visit.

## 2024-04-23 ENCOUNTER — Other Ambulatory Visit: Payer: Self-pay | Admitting: Family Medicine

## 2024-04-23 DIAGNOSIS — M48062 Spinal stenosis, lumbar region with neurogenic claudication: Secondary | ICD-10-CM

## 2024-04-23 DIAGNOSIS — M5010 Cervical disc disorder with radiculopathy, unspecified cervical region: Secondary | ICD-10-CM

## 2024-06-20 ENCOUNTER — Other Ambulatory Visit: Payer: Self-pay | Admitting: Family Medicine

## 2024-06-20 DIAGNOSIS — J4520 Mild intermittent asthma, uncomplicated: Secondary | ICD-10-CM

## 2024-06-20 DIAGNOSIS — M48062 Spinal stenosis, lumbar region with neurogenic claudication: Secondary | ICD-10-CM

## 2024-06-20 DIAGNOSIS — N951 Menopausal and female climacteric states: Secondary | ICD-10-CM

## 2024-06-20 DIAGNOSIS — M5010 Cervical disc disorder with radiculopathy, unspecified cervical region: Secondary | ICD-10-CM

## 2024-06-26 ENCOUNTER — Telehealth: Payer: Self-pay | Admitting: Hematology and Oncology

## 2024-06-26 NOTE — Telephone Encounter (Signed)
 I spoke with patient regarding rescheduled appointment from from 07/02/2024 to 07/31/2024 due to provider being on call.

## 2024-07-02 ENCOUNTER — Ambulatory Visit: Payer: MEDICAID | Admitting: Hematology and Oncology

## 2024-07-06 ENCOUNTER — Other Ambulatory Visit: Payer: Self-pay | Admitting: Adult Health

## 2024-07-06 ENCOUNTER — Inpatient Hospital Stay: Payer: MEDICAID | Attending: Adult Health | Admitting: Adult Health

## 2024-07-06 ENCOUNTER — Encounter: Payer: Self-pay | Admitting: Adult Health

## 2024-07-06 VITALS — BP 123/75 | HR 66 | Temp 98.0°F | Resp 16 | Ht 61.0 in | Wt 215.9 lb

## 2024-07-06 DIAGNOSIS — Z59868 Other specified financial insecurity: Secondary | ICD-10-CM | POA: Insufficient documentation

## 2024-07-06 DIAGNOSIS — Z79811 Long term (current) use of aromatase inhibitors: Secondary | ICD-10-CM | POA: Insufficient documentation

## 2024-07-06 DIAGNOSIS — Z79891 Long term (current) use of opiate analgesic: Secondary | ICD-10-CM | POA: Diagnosis not present

## 2024-07-06 DIAGNOSIS — Z888 Allergy status to other drugs, medicaments and biological substances status: Secondary | ICD-10-CM | POA: Insufficient documentation

## 2024-07-06 DIAGNOSIS — Z9842 Cataract extraction status, left eye: Secondary | ICD-10-CM | POA: Diagnosis not present

## 2024-07-06 DIAGNOSIS — Z87891 Personal history of nicotine dependence: Secondary | ICD-10-CM | POA: Insufficient documentation

## 2024-07-06 DIAGNOSIS — Z86018 Personal history of other benign neoplasm: Secondary | ICD-10-CM | POA: Insufficient documentation

## 2024-07-06 DIAGNOSIS — Z923 Personal history of irradiation: Secondary | ICD-10-CM | POA: Diagnosis not present

## 2024-07-06 DIAGNOSIS — Z808 Family history of malignant neoplasm of other organs or systems: Secondary | ICD-10-CM | POA: Diagnosis not present

## 2024-07-06 DIAGNOSIS — N644 Mastodynia: Secondary | ICD-10-CM | POA: Insufficient documentation

## 2024-07-06 DIAGNOSIS — Z90722 Acquired absence of ovaries, bilateral: Secondary | ICD-10-CM | POA: Insufficient documentation

## 2024-07-06 DIAGNOSIS — D0512 Intraductal carcinoma in situ of left breast: Secondary | ICD-10-CM

## 2024-07-06 DIAGNOSIS — Z803 Family history of malignant neoplasm of breast: Secondary | ICD-10-CM | POA: Diagnosis not present

## 2024-07-06 DIAGNOSIS — N63 Unspecified lump in unspecified breast: Secondary | ICD-10-CM

## 2024-07-06 DIAGNOSIS — Z8249 Family history of ischemic heart disease and other diseases of the circulatory system: Secondary | ICD-10-CM | POA: Diagnosis not present

## 2024-07-06 DIAGNOSIS — Z8543 Personal history of malignant neoplasm of ovary: Secondary | ICD-10-CM | POA: Insufficient documentation

## 2024-07-06 DIAGNOSIS — Z8041 Family history of malignant neoplasm of ovary: Secondary | ICD-10-CM | POA: Insufficient documentation

## 2024-07-06 DIAGNOSIS — Z9841 Cataract extraction status, right eye: Secondary | ICD-10-CM | POA: Insufficient documentation

## 2024-07-06 DIAGNOSIS — G8929 Other chronic pain: Secondary | ICD-10-CM | POA: Diagnosis not present

## 2024-07-06 DIAGNOSIS — Z79899 Other long term (current) drug therapy: Secondary | ICD-10-CM | POA: Insufficient documentation

## 2024-07-06 DIAGNOSIS — Z801 Family history of malignant neoplasm of trachea, bronchus and lung: Secondary | ICD-10-CM | POA: Diagnosis not present

## 2024-07-06 DIAGNOSIS — Z880 Allergy status to penicillin: Secondary | ICD-10-CM | POA: Diagnosis not present

## 2024-07-06 MED ORDER — DICLOFENAC SODIUM 3 % EX GEL: 1.0000 | Freq: Two times a day (BID) | CUTANEOUS | 0 refills | Status: AC

## 2024-07-06 NOTE — Progress Notes (Unsigned)
 SURVIVORSHIP VISIT:  BRIEF ONCOLOGIC HISTORY:  Oncology History  Ductal carcinoma in situ (DCIS) of left breast  11/08/2023 Mammogram   In the left breast, calcifications warrant further evaluation. In the right breast, no findings suspicious for malignancy.   12/05/2023 Pathology Results   High grade DCIS, solid and clinging types with necrosis and calcs. Neg for invasive carcinoma. Insufficienct residual tumor within the deeper prognostic marker immunohistochemical stains to determine the hormone status of the tumor.   12/24/2023 Initial Diagnosis   Ductal carcinoma in situ (DCIS) of left breast   01/10/2024 Cancer Staging   Staging form: Breast, AJCC 8th Edition - Clinical stage from 01/10/2024: Stage Unknown (cTis (DCIS), cNX, ER: Unknown, PR: Unknown, HER2: Unknown) - Signed by Loretha Ash, MD on 01/10/2024 Stage prefix: Initial diagnosis   01/23/2024 Genetic Testing   Negative [facility] CancerNext-Expanded +RNAinsight Panel.  Report date is 01/23/2024.  The CancerNext-Expanded gene panel offered by [facility] and includes sequencing, rearrangement, and RNA analysis for the following 77 genes: AIP, ALK, APC, ATM, AXIN2, BAP1, BARD1, BMPR1A, BRCA1, BRCA2, BRIP1, CDC73, CDH1, CDK4, CDKN1B, CDKN2A, CEBPA, CHEK2, CTNNA1, DDX41, DICER1, ETV6, FH, FLCN, GATA2, LZTR1, MAX, MBD4, MEN1, MET, MLH1, MSH2, MSH3, MSH6, MUTYH, NF1, NF2, NTHL1, PALB2, PHOX2B, PMS2, POT1, PRKAR1A, PTCH1, PTEN, RAD51C, RAD51D, RB1, RET, RUNX1, RSP20, SDHA, SDHAF2, SDHB, SDHC, SDHD, SMAD4, SMARCA4, SMARCB1, SMARCE1, STK11, SUFU, TMEM127, TP53, TSC1, TSC2, VHL, and WT1 (sequencing and deletion/duplication); EGFR, HOXB13, KIT, MITF, PDGFRA, POLD1, and POLE (sequencing only); EPCAM and GREM1 (deletion/duplication only).    02/18/2024 - 03/18/2024 Radiation Therapy   Plan Name: Breast_L_BH Site: Breast, Left Technique: 3D Mode: Photon Dose Per Fraction: 2.66 Gy Prescribed Dose (Delivered / Prescribed): 42.56 Gy / 42.56  Gy Prescribed Fxs (Delivered / Prescribed): 16 / 16   Plan Name: Breast_L_Bst Site: Breast, Left Technique: Electron Mode: Electron Dose Per Fraction: 2 Gy Prescribed Dose (Delivered / Prescribed): 8 Gy / 8 Gy Prescribed Fxs (Delivered / Prescribed): 4 / 4   02/2024 -  Anti-estrogen oral therapy   Anastrozole      INTERVAL HISTORY:  Discussed the use of AI scribe software for clinical note transcription with the patient, who gave verbal consent to proceed.  History of Present Illness [name] is a 53 year old female with stage zero breast cancer who presents for follow-up on her breast cancer treatment and to address breast pain.  She experiences pain in her left breast, described as feeling like a knot, with significant pain this morning that limited arm movement. Cream application and wearing a bra from [employer] [place] have not alleviated the pain, and the bra increases pain and causes indentations.  She underwent a lumpectomy for ductal carcinoma in situ in the left breast and has chronic pain post-surgery. No lymph nodes were removed. She reports breast swelling and is concerned about it.  She is taking anastrozole  for breast cancer treatment and Oxycodone  daily for pain management, though it does not alleviate her breast pain.  She had a bone density test last month and is due for a diagnostic 3D mammogram in February. She quit smoking three years ago after a 32-year history, resulting in a ten pack-year history.    REVIEW OF SYSTEMS:  Review of Systems  Constitutional:  Negative for appetite change, chills, fatigue, fever and unexpected weight change.  HENT:   Negative for hearing loss, lump/mass and trouble swallowing.   Eyes:  Negative for eye problems and icterus.  Respiratory:  Negative for chest  tightness, cough and shortness of breath.   Cardiovascular:  Negative for chest pain, leg swelling and palpitations.  Gastrointestinal:  Negative for abdominal  distention, abdominal pain, constipation, diarrhea, nausea and vomiting.  Endocrine: Negative for hot flashes.  Genitourinary:  Negative for difficulty urinating.   Musculoskeletal:  Negative for arthralgias.  Skin:  Negative for itching and rash.  Neurological:  Negative for dizziness, extremity weakness, headaches and numbness.  Hematological:  Negative for adenopathy. Does not bruise/bleed easily.  Psychiatric/Behavioral:  Negative for depression. The patient is not nervous/anxious.   Breast: Denies any new nodularity, masses, tenderness, nipple changes, or nipple discharge.       PAST MEDICAL/SURGICAL HISTORY:  Past Medical History:  Diagnosis Date   Allergy    pollen, pcn   Anxiety    Arthritis    Asthma    Bipolar disorder (HCC)    Breast cancer (HCC)    Cataract    Depression    Fibroids    Glaucoma    History of ovarian cyst    Hypertension    Menometrorrhagia    Ovarian cancer (HCC)    Sickle cell trait    Past Surgical History:  Procedure Laterality Date   BREAST BIOPSY Left 12/05/2023   MM LT BREAST BX W LOC DEV 1ST LESION IMAGE BX SPEC STEREO GUIDE 12/05/2023 [facility]   BREAST BIOPSY  01/08/2024   MM LT RADIOACTIVE SEED LOC MAMMO GUIDE 01/08/2024 [facility]   BREAST LUMPECTOMY WITH RADIOACTIVE SEED LOCALIZATION Left 01/09/2024   Procedure: BREAST LUMPECTOMY WITH RADIOACTIVE SEED LOCALIZATION;  Surgeon: Aron Shoulders, MD;  Location: [facility];  Service: General;  Laterality: Left;  LEFT BREAST SEED LOCALIZED LUMPECTOMY   CATARACT EXTRACTION, BILATERAL     CESAREAN SECTION     all three pregnancies   COLONOSCOPY  07/04/2021   ROBOTIC ASSISTED BILATERAL SALPINGO OOPHERECTOMY Bilateral 02/04/2024   Procedure: ROBOTIC ASSISTED BILATERAL SALPINGO-OOPHORECTOMY, MINI-LAPAROTOMY FOR SPECIMEN RETRIEVAL;  Surgeon: Eldonna Mays, MD;  Location: [facility];  Service: Gynecology;  Laterality: Bilateral;  POSSBILE STAGING; POSSIBLE LAPAROTOMY     ALLERGIES:   Allergies  Allergen Reactions   Bupropion Hcl Er (Sr) Hives and Other (See Comments)    The patient has an entire page of reactions that can be caused by the medication, but none that she has suffered.   Fruit & Vegetable Daily [Nutritional Supplements] Other (See Comments)    all fruits causes bumps and weird feeling in her mouth   Penicillins Hives   Pollen Extract-Tree Extract Other (See Comments)    Allergies   Other Rash    bugs roaches     CURRENT MEDICATIONS:  Outpatient Encounter Medications as of 07/06/2024  Medication Sig Note   ADVAIR  DISKUS 500-50 MCG/ACT AEPB INHALE 1 PUFF INTO THE LUNGS 2 (TWO) TIMES DAILY.    anastrozole  (ARIMIDEX ) 1 MG tablet Take 1 tablet (1 mg total) by mouth daily.    brimonidine-timolol (COMBIGAN) 0.2-0.5 % ophthalmic solution Place 1 drop into both eyes every 12 (twelve) hours.    busPIRone  (BUSPAR ) 15 MG tablet Take 1 tablet by mouth 2 times per day (Patient taking differently: Take 15 mg by mouth in the morning.)    cariprazine  (VRAYLAR ) 1.5 MG capsule Take 2 capsules by mouth daily. (Patient taking differently: Take 1.5 mg by mouth every evening.)    cholecalciferol (VITAMIN D3) 25 MCG (1000 UNIT) tablet Take 1,000 Units by mouth in the morning.    cloNIDine  (CATAPRES ) 0.2 MG tablet TAKE 1 TABLET (  0.2 MG TOTAL) BY MOUTH AT BEDTIME.    cyclobenzaprine  (FLEXERIL ) 10 MG tablet TAKE 1 TABLET (10 MG TOTAL) BY MOUTH 2 (TWO) TIMES DAILY AS NEEDED FOR MUSCLE SPASMS    DULoxetine  (CYMBALTA ) 60 MG capsule TAKE 1 CAPSULE (60 MG TOTAL) BY MOUTH DAILY.    FLUoxetine  (PROZAC ) 40 MG capsule Take 40 mg by mouth in the morning.    gabapentin  (NEURONTIN ) 300 MG capsule Take 2 capsules (600 mg total) by mouth 2 (two) times daily.    lactulose , encephalopathy, (CHRONULAC ) 10 GM/15ML SOLN Take 15 mLs (10 g total) by mouth 2 (two) times daily as needed for mild constipation. (Patient taking differently: Take 6.6667 g by mouth in the morning.)    levocetirizine  (XYZAL ) 5 MG tablet Take 1 tablet (5 mg total) by mouth every evening. (Patient taking differently: Take 5 mg by mouth in the morning.)    lidocaine  (LIDODERM ) 5 % Place 1 patch onto the skin daily. Remove & Discard patch within 12 hours or as directed by MD (Patient taking differently: Place 1 patch onto the skin daily as needed (pain.).)    LINZESS 145 MCG CAPS capsule Take 145 mcg by mouth daily.    meloxicam  (MOBIC ) 7.5 MG tablet Take 1 tablet (7.5 mg total) by mouth daily as needed. (Patient taking differently: Take 7.5 mg by mouth in the morning.)    Misc. Devices MISC 4-pronged cane.  Diagnosis-spinal stenosis    Misc. Devices MISC Rollator walker with seat.  Diagnosis spinal stenosis 02/04/2024: In car if needed for admission use   naloxone (NARCAN) nasal spray 4 mg/0.1 mL 1 (ONE) SPRAY BY NOSE AS NEEDED, IF FOUND UNRESPONSIVE THEN SPRAY INTO NOSE AND CALL 911 02/04/2024: Has not needed   oxyCODONE -acetaminophen  (PERCOCET) 10-325 MG tablet Take 1 tablet by mouth in the morning, at noon, in the evening, and at bedtime. (Patient taking differently: Take 1 tablet by mouth 5 (five) times daily.)    prazosin  (MINIPRESS ) 1 MG capsule Take 1 capsule by mouth at bedtime for nightmares    traZODone  (DESYREL ) 50 MG tablet TAKE 1 TABLET (50 MG TOTAL) BY MOUTH AT BEDTIME AS NEEDED. FOR SLEEP (Patient taking differently: Take 50 mg by mouth at bedtime.)    triamcinolone  cream (KENALOG ) 0.1 % Apply 1 application topically daily to arm for eczema    triamterene-hydrochlorothiazide (MAXZIDE-25) 37.5-25 MG tablet Take 1 tablet by mouth in the morning.    VENTOLIN  HFA 108 (90 Base) MCG/ACT inhaler INHALE 2 PUFFS INTO THE LUNGS EVERY 6 (SIX) HOURS AS NEEDED FOR WHEEZING OR SHORTNESS OF BREATH.    No facility-administered encounter medications on file as of 07/06/2024.     ONCOLOGIC FAMILY HISTORY:  Family History  Problem Relation Age of Onset   Breast cancer Mother        dx 85s; mets   Hypertension  Mother    Heart disease Father    Throat cancer Father        d. 19   Lung cancer Maternal Uncle        x3 mat uncles   Breast cancer Maternal Grandmother        dx >50; mets to brain   Lung cancer Maternal Grandfather    Breast cancer Paternal Grandmother 27   Lung cancer Paternal Grandfather    Ovarian cancer Other        MGM's sister   Other Neg Hx    Esophageal cancer Neg Hx    Rectal cancer Neg Hx  Stomach cancer Neg Hx    Prostate cancer Neg Hx    Pancreatic cancer Neg Hx    Endometrial cancer Neg Hx      SOCIAL HISTORY:  Social History   Socioeconomic History   Marital status: Divorced    Spouse name: Not on file   Number of children: 3   Years of education: Not on file   Highest education level: 12th grade  Occupational History   Not on file  Tobacco Use   Smoking status: Former    Current packs/day: 0.00    Types: Cigarettes    Quit date: 02/14/2016    Years since quitting: 8.3    Passive exposure: Never   Smokeless tobacco: Never   Tobacco comments:    3 cigarettes daily 06-22-21  Vaping Use   Vaping status: Never Used  Substance and Sexual Activity   Alcohol  use: Yes    Comment: socially   Drug use: No   Sexual activity: Yes    Partners: Male    Birth control/protection: None  Other Topics Concern   Not on file  Social History Narrative   Not on file   Social Drivers of Health   Financial Resource Strain: Medium Risk (01/17/2024)   Overall Financial Resource Strain (CARDIA)    Difficulty of Paying Living Expenses: Somewhat hard  Food Insecurity: No Food Insecurity (02/05/2024)   Hunger Vital Sign    Worried About Running Out of Food in the Last Year: Never true    Ran Out of Food in the Last Year: Never true  Transportation Needs: No Transportation Needs (02/05/2024)   PRAPARE - Administrator, Civil Service (Medical): No    Lack of Transportation (Non-Medical): No  Physical Activity: Insufficiently Active (01/17/2024)   Exercise  Vital Sign    Days of Exercise per Week: 2 days    Minutes of Exercise per Session: 10 min  Stress: Stress Concern Present (01/17/2024)   [employer] of Occupational Health - Occupational Stress Questionnaire    Feeling of Stress : To some extent  Social Connections: Moderately Integrated (01/17/2024)   Social Connection and Isolation Panel    Frequency of Communication with Friends and Family: Twice a week    Frequency of Social Gatherings with Friends and Family: Once a week    Attends Religious Services: 1 to 4 times per year    Active Member of [employer] or Organizations: Patient declined    Attends Banker Meetings: 1 to 4 times per year    Marital Status: Divorced  Catering manager Violence: Not At Risk (02/05/2024)   Humiliation, Afraid, Rape, and Kick questionnaire    Fear of Current or Ex-Partner: No    Emotionally Abused: No    Physically Abused: No    Sexually Abused: No     OBSERVATIONS/OBJECTIVE:  BP 123/75 (BP Location: Right Arm, Patient Position: Sitting)   Pulse 66   Temp 98 F (36.7 C) (Temporal)   Resp 16   Ht 5' 1 (1.549 m)   Wt 215 lb 14.4 oz (97.9 kg)   LMP 03/28/2016   SpO2 97%   BMI 40.79 kg/m  GENERAL: Patient is a well appearing female in no acute distress HEENT:  Sclerae anicteric.  Oropharynx clear and moist. No ulcerations or evidence of oropharyngeal candidiasis. Neck is supple.  NODES:  No cervical, supraclavicular, or axillary lymphadenopathy palpated.  BREAST EXAM:  left breast s/p lumpectomy and radiation, + swelling noted, no sign of local  recurrence, right breast benign LUNGS:  Clear to auscultation bilaterally.  No wheezes or rhonchi. HEART:  Regular rate and rhythm. No murmur appreciated. ABDOMEN:  Soft, nontender.  Positive, normoactive bowel sounds. No organomegaly palpated. MSK:  No focal spinal tenderness to palpation. Full range of motion bilaterally in the upper extremities. EXTREMITIES:  No peripheral edema.   SKIN:   Clear with no obvious rashes or skin changes. No nail dyscrasia. NEURO:  Nonfocal. Well oriented.  Appropriate affect.   LABORATORY DATA:  None for this visit.  DIAGNOSTIC IMAGING:  None for this visit.      ASSESSMENT AND PLAN:  [name] is a pleasant 53 y.o. female with Stage 0 left breast DCIS, ER+/PR+/HER2-, diagnosed in 10/2023, treated with lumpectomy, adjuvant radiation therapy, and anti-estrogen therapy with Anastrozole  beginning in 02/2024.  She presents to the Survivorship Clinic for our initial meeting and routine follow-up post-completion of treatment for breast cancer.    1. Stage 0 left breast cancer:  [name] is continuing to recover from definitive treatment for breast cancer. She will follow-up with her medical oncologist, Dr. Loretha with history and physical exam per surveillance protocol.  She will continue her anti-estrogen therapy with Anastrozole . Thus far, she is tolerating the Anastrozole  well, with minimal side effects. Her mammogram is due 10/2024; orders placed today.   Today, a comprehensive survivorship care plan and treatment summary was reviewed with the patient today detailing her breast cancer diagnosis, treatment course, potential late/long-term effects of treatment, appropriate follow-up care with recommendations for the future, and patient education resources.  A copy of this summary, along with a letter will be sent to the patient's primary care provider via mail/fax/In Basket message after today's visit.    2. Breast swelling: referral to PT, diclofenac gel prescribed for breast pain in addition to ultrasound to evaluate for underlying seroma.    3. Bone health:  Given [name]'s age/history of breast cancer and her current treatment regimen including anti-estrogen therapy with Anastrozole , she is at risk for bone demineralization.  Bone density testing was ordered for patient and she has not yet scheduled this.  She was given education on specific  activities to promote bone health.  4. Cancer screening:  Due to [name]'s history and her age, she should receive screening for skin cancers, colon cancer, and gynecologic cancers.  The information and recommendations are listed on the patient's comprehensive care plan/treatment summary and were reviewed in detail with the patient.    5. Health maintenance and wellness promotion: [name] was encouraged to consume 5-7 servings of fruits and vegetables per day. We reviewed the Nutrition Rainbow handout.  She was also encouraged to engage in moderate to vigorous exercise for 30 minutes per day most days of the week.  She was instructed to limit her alcohol  consumption and continue to abstain from tobacco use.     6. Support services/counseling: It is not uncommon for this period of the patient's cancer care trajectory to be one of many emotions and stressors.   She was given information regarding our available services and encouraged to contact me with any questions or for help enrolling in any of our support group/programs.    Follow up instructions:    -Return to cancer center in 4 weeks months for f/u with Dr. Loretha -Mammogram due in 10/2024 -Refer to PT -Breast ultrasound -Recommended bone density testing  -She is welcome to return back to the Survivorship Clinic at any time; no additional follow-up  needed at this time.  -Consider referral back to survivorship as a long-term survivor for continued surveillance  The patient was provided an opportunity to ask questions and all were answered. The patient agreed with the plan and demonstrated an understanding of the instructions.   Total encounter time:45 minutes*in face-to-face visit time, chart review, lab review, care coordination, order entry, and documentation of the encounter time.    Morna Kendall, NP 07/06/24 1:26 PM Medical Oncology and Hematology [facility] [address] [place], [place] [address] Tel.  [phone]    Fax. [phone]  *Total Encounter Time as defined by the Centers for Medicare and Medicaid Services includes, in addition to the face-to-face time of a patient visit (documented in the note above) non-face-to-face time: obtaining and reviewing outside history, ordering and reviewing medications, tests or procedures, care coordination (communications with other health care professionals or caregivers) and documentation in the medical record.

## 2024-07-08 ENCOUNTER — Encounter: Payer: Self-pay | Admitting: Family Medicine

## 2024-07-09 ENCOUNTER — Other Ambulatory Visit: Payer: Self-pay

## 2024-07-09 ENCOUNTER — Ambulatory Visit: Payer: MEDICAID | Attending: Adult Health | Admitting: Rehabilitation

## 2024-07-09 ENCOUNTER — Encounter: Payer: Self-pay | Admitting: Rehabilitation

## 2024-07-09 DIAGNOSIS — M25612 Stiffness of left shoulder, not elsewhere classified: Secondary | ICD-10-CM | POA: Diagnosis present

## 2024-07-09 DIAGNOSIS — I890 Lymphedema, not elsewhere classified: Secondary | ICD-10-CM | POA: Diagnosis present

## 2024-07-09 DIAGNOSIS — N63 Unspecified lump in unspecified breast: Secondary | ICD-10-CM | POA: Insufficient documentation

## 2024-07-09 DIAGNOSIS — N644 Mastodynia: Secondary | ICD-10-CM | POA: Insufficient documentation

## 2024-07-09 DIAGNOSIS — D0512 Intraductal carcinoma in situ of left breast: Secondary | ICD-10-CM | POA: Diagnosis present

## 2024-07-09 NOTE — Therapy (Signed)
 OUTPATIENT PHYSICAL THERAPY  UPPER EXTREMITY ONCOLOGY EVALUATION  Patient Name: [name] MRN: [id number] DOB:03/09/1971, 53 y.o., female Today's Date: 07/10/2024  END OF SESSION:  PT End of Session - 07/10/24 0810     Visit Number 1    Number of Visits 7    Date for Recertification  08/21/24    Authorization Type needed    PT Start Time 1107    PT Stop Time 1155    PT Time Calculation (min) 48 min    Activity Tolerance Patient tolerated treatment well    Behavior During Therapy WFL for tasks assessed/performed          Past Medical History:  Diagnosis Date   Allergy    pollen, pcn   Anxiety    Arthritis    Asthma    Bipolar disorder (HCC)    Breast cancer (HCC)    Cataract    Depression    Fibroids    Glaucoma    History of ovarian cyst    Hypertension    Menometrorrhagia    Ovarian cancer (HCC)    Sickle cell trait    Past Surgical History:  Procedure Laterality Date   BREAST BIOPSY Left 12/05/2023   MM LT BREAST BX W LOC DEV 1ST LESION IMAGE BX SPEC STEREO GUIDE 12/05/2023 [facility]   BREAST BIOPSY  01/08/2024   MM LT RADIOACTIVE SEED LOC MAMMO GUIDE 01/08/2024 [facility]   BREAST LUMPECTOMY WITH RADIOACTIVE SEED LOCALIZATION Left 01/09/2024   Procedure: BREAST LUMPECTOMY WITH RADIOACTIVE SEED LOCALIZATION;  Surgeon: Aron Shoulders, MD;  Location: [facility];  Service: General;  Laterality: Left;  LEFT BREAST SEED LOCALIZED LUMPECTOMY   CATARACT EXTRACTION, BILATERAL     CESAREAN SECTION     all three pregnancies   COLONOSCOPY  07/04/2021   ROBOTIC ASSISTED BILATERAL SALPINGO OOPHERECTOMY Bilateral 02/04/2024   Procedure: ROBOTIC ASSISTED BILATERAL SALPINGO-OOPHORECTOMY, MINI-LAPAROTOMY FOR SPECIMEN RETRIEVAL;  Surgeon: Eldonna Mays, MD;  Location: [facility];  Service: Gynecology;  Laterality: Bilateral;  POSSBILE STAGING; POSSIBLE LAPAROTOMY   Patient Active Problem List   Diagnosis Date Noted   Genetic testing 01/27/2024   Adnexal mass  01/07/2024   Ductal carcinoma in situ (DCIS) of left breast 12/24/2023   Tobacco abuse 09/15/2018   Visual disturbance 04/24/2018   Obesity 12/12/2017   Insomnia 03/13/2017   Anxiety associated with depression 01/09/2017   Tooth ache 05/24/2016   Spinal stenosis, lumbar region, with neurogenic claudication 05/15/2016   Cervical disc disorder with radiculopathy of cervical region 05/15/2016   Vaginal atrophy 04/30/2016   Asthma 02/20/2016   Depression 02/20/2016   Myopathy 02/20/2016   Back pain 02/20/2016   Neuropathy 02/20/2016   Fibroids    History of ovarian cyst    Menometrorrhagia 04/09/2011    PCP: Corrina Sabin, MD  REFERRING PROVIDER: Morna Kendall, NP  REFERRING DIAG:  Diagnosis  N63.0 (ICD-10-CM) - Breast swelling   THERAPY DIAG:  Ductal carcinoma in situ (DCIS) of left breast  Breast pain  Lymphedema, not elsewhere classified  Stiffness of left shoulder, not elsewhere classified  ONSET DATE: 11/08/23  Rationale for Evaluation and Treatment: Rehabilitation  SUBJECTIVE:  SUBJECTIVE STATEMENT: It has been swelling for the last 2 months.  It has sharp pain and swelling.  I have a compression bra from [employer] that I wear all the time.  It maybe helps a little.  I just got the extender.  My arm is really stiff and hard to use.   PERTINENT HISTORY: DCIS of left breast, completed radiation 03/18/24, other hx includes scoliosis which includes pain.    PAIN:  Are you having pain? Yes NPRS scale: 3 /10 Pain location: left breast Pain orientation: Left  PAIN TYPE: sharp around the inside edge and achy on all rest  Pain description: constant  Aggravating factors: nothing really  Relieving factors: oxycodone  - more for the back   PRECAUTIONS: None  RED  FLAGS: None   WEIGHT BEARING RESTRICTIONS: No  FALLS:  Has patient fallen in last 6 months? No  LIVING ENVIRONMENT: Lives with: lives with their family and lives with their daughter Has following equipment at home: Single point cane and Environmental consultant - 2 wheeled  OCCUPATION: not working   LEISURE: nothing   HAND DOMINANCE: right   PRIOR LEVEL OF FUNCTION: Needs assistance with ADLs a nurse comes 3.5 hours per day.    PATIENT GOALS: decrease the breast pain.     OBJECTIVE: Note: Objective measures were completed at Evaluation unless otherwise noted.  COGNITION: Overall cognitive status: Within functional limits for tasks assessed   PALPATION:  +2 TTP and tightness with Lt rhomboids, lats, UT, supraclavicular fossa, inferior and medial breast with fibrosis mostly noted the inferior medial corner of the breast.    OBSERVATIONS / OTHER ASSESSMENTS: enlarged pores left medial inferior breast   SENSATION: reports occasional numbness in the Lt arm   POSTURE: holds Lt arm guarded   UPPER EXTREMITY AROM/PROM:  A/PROM RIGHT   eval   Shoulder extension 60  Shoulder flexion 165  Shoulder abduction 170  Shoulder internal rotation 70  Shoulder external rotation 86    (Blank rows = not tested)  A/PROM LEFT   eval  Shoulder extension 36 painful  Shoulder flexion AROM: 100 pain in axilla PROM: 65 pain in breast  Shoulder abduction AROM:98 pain in axilla PROM: 70 pain in breast   Shoulder internal rotation   Shoulder external rotation PROM: 45 pain in breast    (Blank rows = not tested)   CERVICAL AROM: All within normal limits: WFL but pain with Lt rotation  UPPER EXTREMITY STRENGTH:   BREAST COMPLAINTS QUESTIONNAIRE    Pain: 7 Heaviness: 7 Swollen feeling: 7 Tense Skin: 7 Redness: 0 Bra Print: 7 Size of Pores: 7 Hard feeling: 7 Total: 49/80 A Score over 9 indicates lymphedema issues in the breast                                                                                                                            TREATMENT DATE:  07/09/24 Eval performed Very gentle STM to the UT,  pectoralis, anterior chest and modified MLD to include the anterior interaxillary pathway to get pt used to this treatment of the breast throughout discussing physiology and reasons for treatment.  Then education on post op exercises to start Gave foam chip rectangle with instruction on use    PATIENT EDUCATION:  Education details: per today's note Person educated: Patient and Child(ren) Education method: Programmer, multimedia, Demonstration, Verbal cues, and Handouts Education comprehension: verbalized understanding and needs further education  HOME EXERCISE PROGRAM: Post-op exercises  ASSESSMENT:  CLINICAL IMPRESSION: Patient is a 53 y.o. female who was seen today for physical therapy evaluation and treatment for her left breast lymphedema, decreased use of the Lt UE and neck and shoulder pain following lumpectomy and radiation.  Pt will be having an US  soon to see if there is any seroma present.  She has very limited AROM with pain, significant tenderness to palpation of the Left upper quadrant including the breast, and is still needing help with dressing, and ADLs.  She does have a nurse that helps with ADL troubles due to her back pain.  She has lymphedema and fibrosis in the left breast .    OBJECTIVE IMPAIRMENTS: decreased activity tolerance, decreased knowledge of condition, decreased ROM, decreased strength, increased edema, increased fascial restrictions, and pain.   ACTIVITY LIMITATIONS: carrying, lifting, and sleeping  PARTICIPATION LIMITATIONS: meal prep, cleaning, laundry, driving, shopping, and community activity  PERSONAL FACTORS: Fitness , radiation hx, are also affecting patient's functional outcome.   REHAB POTENTIAL: Excellent  CLINICAL DECISION MAKING: Stable/uncomplicated  EVALUATION COMPLEXITY: Low  GOALS: Goals reviewed with patient?  Yes  SHORT TERM GOALS: Target date: 07/09/24  Pt will be educated on breast lymphedema and lymphatic physiology  Baseline: Goal status: MET  2.  Pt will be educated on initial HEP  Baseline:  Goal status: MET   LONG TERM GOALS: Target date: 08/21/24  Pt will improve Left shoulder AROM to at least 160deg of flexion and abduction to improve mobility  Baseline:  Goal status: INITIAL  2.  Pt will be ind with self MLD of the breast Baseline:  Goal status: INITIAL  3.  Pt will decrease breast edema questionnaire to 25/80 or less  Baseline:  Goal status: INITIAL   PLAN:  PT FREQUENCY: 1-2x/week - 1x per wee due to transportation most likely   PT DURATION: 6 weeks  PLANNED INTERVENTIONS: 97164- PT Re-evaluation, 97110-Therapeutic exercises, 97530- Therapeutic activity, 97112- Neuromuscular re-education, 97535- Self Care, [id number]- Manual therapy, Patient/Family education, Balance training, Joint mobilization, Therapeutic exercises, Therapeutic activity, Neuromuscular re-education, Gait training, and Self Care, dry needling   PLAN FOR NEXT SESSION: start MLD Lt breast, STM for pain relief, PROM/AAROM, how was foam?  Larue Saddie SAUNDERS, PT 07/10/2024, 8:11 AM

## 2024-07-16 ENCOUNTER — Ambulatory Visit: Payer: MEDICAID

## 2024-07-16 ENCOUNTER — Ambulatory Visit
Admission: RE | Admit: 2024-07-16 | Discharge: 2024-07-16 | Disposition: A | Discharge status: Home / Self Care | Payer: MEDICAID | Source: Ambulatory Visit | Attending: Adult Health | Admitting: Adult Health

## 2024-07-16 DIAGNOSIS — N63 Unspecified lump in unspecified breast: Secondary | ICD-10-CM

## 2024-07-16 HISTORY — DX: Personal history of irradiation: Z92.3

## 2024-07-17 ENCOUNTER — Ambulatory Visit: Payer: MEDICAID | Admitting: Rehabilitation

## 2024-07-17 ENCOUNTER — Other Ambulatory Visit: Payer: Self-pay | Admitting: Family Medicine

## 2024-07-17 ENCOUNTER — Encounter: Payer: Self-pay | Admitting: Rehabilitation

## 2024-07-17 DIAGNOSIS — D0512 Intraductal carcinoma in situ of left breast: Secondary | ICD-10-CM

## 2024-07-17 DIAGNOSIS — M25612 Stiffness of left shoulder, not elsewhere classified: Secondary | ICD-10-CM

## 2024-07-17 DIAGNOSIS — I890 Lymphedema, not elsewhere classified: Secondary | ICD-10-CM

## 2024-07-17 DIAGNOSIS — N644 Mastodynia: Secondary | ICD-10-CM

## 2024-07-17 NOTE — Therapy (Signed)
 OUTPATIENT PHYSICAL THERAPY  UPPER EXTREMITY ONCOLOGY EVALUATION  Patient Name: [name] MRN: [id number] DOB:27-Dec-1970, 53 y.o., female Today's Date: 07/20/2024  END OF SESSION:    Past Medical History:  Diagnosis Date   Allergy    pollen, pcn   Anxiety    Arthritis    Asthma    Bipolar disorder (HCC)    Breast cancer (HCC)    Cataract    Depression    Fibroids    Glaucoma    History of ovarian cyst    Hypertension    Menometrorrhagia    Ovarian cancer (HCC)    Personal history of radiation therapy    Sickle cell trait    Past Surgical History:  Procedure Laterality Date   BREAST BIOPSY Left 12/05/2023   MM LT BREAST BX W LOC DEV 1ST LESION IMAGE BX SPEC STEREO GUIDE 12/05/2023 [facility]   BREAST BIOPSY  01/08/2024   MM LT RADIOACTIVE SEED LOC MAMMO GUIDE 01/08/2024 [facility]   BREAST LUMPECTOMY Left 01/09/2024   BREAST LUMPECTOMY WITH RADIOACTIVE SEED LOCALIZATION Left 01/09/2024   Procedure: BREAST LUMPECTOMY WITH RADIOACTIVE SEED LOCALIZATION;  Surgeon: Aron Shoulders, MD;  Location: [facility];  Service: General;  Laterality: Left;  LEFT BREAST SEED LOCALIZED LUMPECTOMY   CATARACT EXTRACTION, BILATERAL     CESAREAN SECTION     all three pregnancies   COLONOSCOPY  07/04/2021   ROBOTIC ASSISTED BILATERAL SALPINGO OOPHERECTOMY Bilateral 02/04/2024   Procedure: ROBOTIC ASSISTED BILATERAL SALPINGO-OOPHORECTOMY, MINI-LAPAROTOMY FOR SPECIMEN RETRIEVAL;  Surgeon: Eldonna Mays, MD;  Location: [facility];  Service: Gynecology;  Laterality: Bilateral;  POSSBILE STAGING; POSSIBLE LAPAROTOMY   Patient Active Problem List   Diagnosis Date Noted   Genetic testing 01/27/2024   Adnexal mass 01/07/2024   Ductal carcinoma in situ (DCIS) of left breast 12/24/2023   Tobacco abuse 09/15/2018   Visual disturbance 04/24/2018   Obesity 12/12/2017   Insomnia 03/13/2017   Anxiety associated with depression 01/09/2017   Tooth ache 05/24/2016   Spinal stenosis,  lumbar region, with neurogenic claudication 05/15/2016   Cervical disc disorder with radiculopathy of cervical region 05/15/2016   Vaginal atrophy 04/30/2016   Asthma 02/20/2016   Depression 02/20/2016   Myopathy 02/20/2016   Back pain 02/20/2016   Neuropathy 02/20/2016   Fibroids    History of ovarian cyst    Menometrorrhagia 04/09/2011    PCP: Corrina Sabin, MD  REFERRING PROVIDER: Morna Kendall, NP  REFERRING DIAG:  Diagnosis  N63.0 (ICD-10-CM) - Breast swelling   THERAPY DIAG:  Ductal carcinoma in situ (DCIS) of left breast  Breast pain  Lymphedema, not elsewhere classified  Stiffness of left shoulder, not elsewhere classified  ONSET DATE: 11/08/23  Rationale for Evaluation and Treatment: Rehabilitation  SUBJECTIVE:  SUBJECTIVE STATEMENT: Patient reports she is still having the breast pain. She has been wearing the chip pad and has been complaint with her HEP.   PERTINENT HISTORY: DCIS of left breast, completed radiation 03/18/24, other hx includes scoliosis which includes pain.    PAIN:  Are you having pain? Yes NPRS scale: 8/10 Pain location: left breast Pain orientation: Left  PAIN TYPE: sharp around the inside edge and achy on all rest  Pain description: constant  Aggravating factors: nothing really  Relieving factors: oxycodone  - more for the back   PRECAUTIONS: None  RED FLAGS: None   WEIGHT BEARING RESTRICTIONS: No  FALLS:  Has patient fallen in last 6 months? No  LIVING ENVIRONMENT: Lives with: lives with their family and lives with their daughter Has following equipment at home: Single point cane and Environmental Consultant - 2 wheeled  OCCUPATION: not working   LEISURE: nothing   HAND DOMINANCE: right   PRIOR LEVEL OF FUNCTION: Needs assistance with ADLs a nurse comes  3.5 hours per day.    PATIENT GOALS: decrease the breast pain.     OBJECTIVE: Note: Objective measures were completed at Evaluation unless otherwise noted.  COGNITION: Overall cognitive status: Within functional limits for tasks assessed   PALPATION:  +2 TTP and tightness with Lt rhomboids, lats, UT, supraclavicular fossa, inferior and medial breast with fibrosis mostly noted the inferior medial corner of the breast.    OBSERVATIONS / OTHER ASSESSMENTS: enlarged pores left medial inferior breast   SENSATION: reports occasional numbness in the Lt arm   POSTURE: holds Lt arm guarded   UPPER EXTREMITY AROM/PROM:  A/PROM RIGHT   eval   Shoulder extension 60  Shoulder flexion 165  Shoulder abduction 170  Shoulder internal rotation 70  Shoulder external rotation 86    (Blank rows = not tested)  A/PROM LEFT   eval  Shoulder extension 36 painful  Shoulder flexion AROM: 100 pain in axilla PROM: 65 pain in breast  Shoulder abduction AROM:98 pain in axilla PROM: 70 pain in breast   Shoulder internal rotation   Shoulder external rotation PROM: 45 pain in breast    (Blank rows = not tested)   CERVICAL AROM: All within normal limits: WFL but pain with Lt rotation  UPPER EXTREMITY STRENGTH:   BREAST COMPLAINTS QUESTIONNAIRE    Pain: 7 Heaviness: 7 Swollen feeling: 7 Tense Skin: 7 Redness: 0 Bra Print: 7 Size of Pores: 7 Hard feeling: 7 Total: 49/80 A Score over 9 indicates lymphedema issues in the breast                                                                                                                           TREATMENT DATE:  07/17/24 Pt education regarding wearing the compression bra and going to get a new one since the patient feels like her current one is loose Supine dowel flexion x 10 STM to pectoralis Performed with pt permission supine: clavicular and sternal nodes,  bil axillary nodes, left inguinal nodes, anterior interaxillary pathway, left  axillo-inguinal pathway, and then left breast towards the nearest nodes/pathway, and then repeating all steps in reverse. Pt education on how to perform self MLD throughout the session  07/09/24 Eval performed Very gentle STM to the UT, pectoralis, anterior chest and modified MLD to include the anterior interaxillary pathway to get pt used to this treatment of the breast throughout discussing physiology and reasons for treatment.  Then education on post op exercises to start Gave foam chip rectangle with instruction on use    PATIENT EDUCATION:  Education details: per today's note Person educated: Patient and Child(ren) Education method: Explanation, Demonstration, Verbal cues, and Handouts Education comprehension: verbalized understanding and needs further education  HOME EXERCISE PROGRAM: Post-op exercises  ASSESSMENT:  CLINICAL IMPRESSION: Patient tolerates the addition of breast MLD with reports of decreased pain following. The patient reports her current compression bra is too big and she needs to get a new one in a smaller size. The patient tolerates the addition of supine shoulder flexion with a dowel with no increases of pain or tenderness. The steps of breast MLD are explained to the patient while the SPT performs it on the patient; a handout with the steps is also provided to the patient. The patient would benefit from continued skilled physical therapy to improve strength and ROM, while decreasing pain and edema.    OBJECTIVE IMPAIRMENTS: decreased activity tolerance, decreased knowledge of condition, decreased ROM, decreased strength, increased edema, increased fascial restrictions, and pain.   ACTIVITY LIMITATIONS: carrying, lifting, and sleeping  PARTICIPATION LIMITATIONS: meal prep, cleaning, laundry, driving, shopping, and community activity  PERSONAL FACTORS: Fitness , radiation hx, are also affecting patient's functional outcome.   REHAB POTENTIAL:  Excellent  CLINICAL DECISION MAKING: Stable/uncomplicated  EVALUATION COMPLEXITY: Low  GOALS: Goals reviewed with patient? Yes  SHORT TERM GOALS: Target date: 07/09/24  Pt will be educated on breast lymphedema and lymphatic physiology  Baseline: Goal status: MET  2.  Pt will be educated on initial HEP  Baseline:  Goal status: MET   LONG TERM GOALS: Target date: 08/21/24  Pt will improve Left shoulder AROM to at least 160deg of flexion and abduction to improve mobility  Baseline:  Goal status: INITIAL  2.  Pt will be ind with self MLD of the breast Baseline:  Goal status: INITIAL  3.  Pt will decrease breast edema questionnaire to 25/80 or less  Baseline:  Goal status: INITIAL   PLAN:  PT FREQUENCY: 1-2x/week - 1x per wee due to transportation most likely   PT DURATION: 6 weeks  PLANNED INTERVENTIONS: 97164- PT Re-evaluation, 97110-Therapeutic exercises, 97530- Therapeutic activity, 97112- Neuromuscular re-education, 97535- Self Care, [id number]- Manual therapy, Patient/Family education, Balance training, Joint mobilization, Therapeutic exercises, Therapeutic activity, Neuromuscular re-education, Gait training, and Self Care, dry needling   PLAN FOR NEXT SESSION: start MLD Lt breast, STM for pain relief, PROM/AAROM, how was foam?  Randall Pack, SPT  07/20/2024, 12:57 PM  I agree with the following treatment note after reviewing documentation. This session was performed under the supervision of a licensed clinician.  Saddie Raw, PT 07/20/24, 12:57 PM

## 2024-07-23 ENCOUNTER — Ambulatory Visit: Payer: MEDICAID | Attending: Adult Health | Admitting: Rehabilitation

## 2024-07-23 ENCOUNTER — Encounter: Payer: Self-pay | Admitting: Rehabilitation

## 2024-07-23 DIAGNOSIS — M25612 Stiffness of left shoulder, not elsewhere classified: Secondary | ICD-10-CM | POA: Diagnosis present

## 2024-07-23 DIAGNOSIS — I890 Lymphedema, not elsewhere classified: Secondary | ICD-10-CM | POA: Insufficient documentation

## 2024-07-23 DIAGNOSIS — N644 Mastodynia: Secondary | ICD-10-CM | POA: Insufficient documentation

## 2024-07-23 DIAGNOSIS — D0512 Intraductal carcinoma in situ of left breast: Secondary | ICD-10-CM | POA: Insufficient documentation

## 2024-07-23 NOTE — Therapy (Signed)
 OUTPATIENT PHYSICAL THERAPY  UPPER EXTREMITY ONCOLOGY Patient Name: [name] MRN: [id number] DOB:1971/05/08, 53 y.o., female Today's Date: 07/23/2024  END OF SESSION:  PT End of Session - 07/23/24 1154     Visit Number 3    Number of Visits 7    Date for Recertification  08/21/24    Authorization Type 7 visits 07/09/24-08/21/24    Authorization - Visit Number 3    Authorization - Number of Visits 7    PT Start Time 1158    PT Stop Time 1248    PT Time Calculation (min) 50 min    Activity Tolerance Patient tolerated treatment well    Behavior During Therapy WFL for tasks assessed/performed           Past Medical History:  Diagnosis Date   Allergy    pollen, pcn   Anxiety    Arthritis    Asthma    Bipolar disorder (HCC)    Breast cancer (HCC)    Cataract    Depression    Fibroids    Glaucoma    History of ovarian cyst    Hypertension    Menometrorrhagia    Ovarian cancer (HCC)    Personal history of radiation therapy    Sickle cell trait    Past Surgical History:  Procedure Laterality Date   BREAST BIOPSY Left 12/05/2023   MM LT BREAST BX W LOC DEV 1ST LESION IMAGE BX SPEC STEREO GUIDE 12/05/2023 [facility]   BREAST BIOPSY  01/08/2024   MM LT RADIOACTIVE SEED LOC MAMMO GUIDE 01/08/2024 [facility]   BREAST LUMPECTOMY Left 01/09/2024   BREAST LUMPECTOMY WITH RADIOACTIVE SEED LOCALIZATION Left 01/09/2024   Procedure: BREAST LUMPECTOMY WITH RADIOACTIVE SEED LOCALIZATION;  Surgeon: Aron Shoulders, MD;  Location: [facility];  Service: General;  Laterality: Left;  LEFT BREAST SEED LOCALIZED LUMPECTOMY   CATARACT EXTRACTION, BILATERAL     CESAREAN SECTION     all three pregnancies   COLONOSCOPY  07/04/2021   ROBOTIC ASSISTED BILATERAL SALPINGO OOPHERECTOMY Bilateral 02/04/2024   Procedure: ROBOTIC ASSISTED BILATERAL SALPINGO-OOPHORECTOMY, MINI-LAPAROTOMY FOR SPECIMEN RETRIEVAL;  Surgeon: Eldonna Mays, MD;  Location: [facility];  Service: Gynecology;   Laterality: Bilateral;  POSSBILE STAGING; POSSIBLE LAPAROTOMY   Patient Active Problem List   Diagnosis Date Noted   Genetic testing 01/27/2024   Adnexal mass 01/07/2024   Ductal carcinoma in situ (DCIS) of left breast 12/24/2023   Tobacco abuse 09/15/2018   Visual disturbance 04/24/2018   Obesity 12/12/2017   Insomnia 03/13/2017   Anxiety associated with depression 01/09/2017   Tooth ache 05/24/2016   Spinal stenosis, lumbar region, with neurogenic claudication 05/15/2016   Cervical disc disorder with radiculopathy of cervical region 05/15/2016   Vaginal atrophy 04/30/2016   Asthma 02/20/2016   Depression 02/20/2016   Myopathy 02/20/2016   Back pain 02/20/2016   Neuropathy 02/20/2016   Fibroids    History of ovarian cyst    Menometrorrhagia 04/09/2011    PCP: Corrina Sabin, MD  REFERRING PROVIDER: Morna Kendall, NP  REFERRING DIAG:  Diagnosis  N63.0 (ICD-10-CM) - Breast swelling   THERAPY DIAG:  Ductal carcinoma in situ (DCIS) of left breast  Breast pain  Lymphedema, not elsewhere classified  Stiffness of left shoulder, not elsewhere classified  ONSET DATE: 11/08/23  Rationale for Evaluation and Treatment: Rehabilitation  SUBJECTIVE:  SUBJECTIVE STATEMENT: I got my new bra  PERTINENT HISTORY: DCIS of left breast, completed radiation 03/18/24, other hx includes scoliosis which includes pain.    PAIN:  Are you having pain? Yes NPRS scale: 4/10 Pain location: left breast Pain orientation: Left  PAIN TYPE: sharp around the inside edge and achy on all rest  Pain description: constant  Aggravating factors: nothing really  Relieving factors: oxycodone  - more for the back   PRECAUTIONS: None  RED FLAGS: None   WEIGHT BEARING RESTRICTIONS: No  FALLS:  Has patient fallen in  last 6 months? No  LIVING ENVIRONMENT: Lives with: lives with their family and lives with their daughter Has following equipment at home: Single point cane and Environmental Consultant - 2 wheeled  OCCUPATION: not working   LEISURE: nothing   HAND DOMINANCE: right   PRIOR LEVEL OF FUNCTION: Needs assistance with ADLs a nurse comes 3.5 hours per day.    PATIENT GOALS: decrease the breast pain.     OBJECTIVE: Note: Objective measures were completed at Evaluation unless otherwise noted.  COGNITION: Overall cognitive status: Within functional limits for tasks assessed   PALPATION:  +2 TTP and tightness with Lt rhomboids, lats, UT, supraclavicular fossa, inferior and medial breast with fibrosis mostly noted the inferior medial corner of the breast.    OBSERVATIONS / OTHER ASSESSMENTS: enlarged pores left medial inferior breast   SENSATION: reports occasional numbness in the Lt arm   POSTURE: holds Lt arm guarded   UPPER EXTREMITY AROM/PROM:  A/PROM RIGHT   eval   Shoulder extension 60  Shoulder flexion 165  Shoulder abduction 170  Shoulder internal rotation 70  Shoulder external rotation 86    (Blank rows = not tested)  A/PROM LEFT   eval  Shoulder extension 36 painful  Shoulder flexion AROM: 100 pain in axilla PROM: 65 pain in breast  Shoulder abduction AROM:98 pain in axilla PROM: 70 pain in breast   Shoulder internal rotation   Shoulder external rotation PROM: 45 pain in breast    (Blank rows = not tested)   CERVICAL AROM: All within normal limits: WFL but pain with Lt rotation  UPPER EXTREMITY STRENGTH:   BREAST COMPLAINTS QUESTIONNAIRE    Pain: 7 Heaviness: 7 Swollen feeling: 7 Tense Skin: 7 Redness: 0 Bra Print: 7 Size of Pores: 7 Hard feeling: 7 Total: 49/80 A Score over 9 indicates lymphedema issues in the breast                                                                                                                           TREATMENT DATE:  Pt  permission and consent throughout each step of examination and treatment with modification and draping if requested when working on sensitive areas.  Pt is aware that a clinic chaperone is not available.   07/23/24 Therapeutic Activities Pulleys into flexion and abduction x each to improve reach Wall ball flexion 5 x 5 Manual Therapy  supine: clavicular  and sternal nodes, bil axillary nodes, left inguinal nodes, anterior interaxillary pathway, left axillo-inguinal pathway, and then left breast towards the nearest nodes/pathway, and then repeating all steps in reverse. Then posterior interaxillary work and work on lateral breast in sidelying and then one more time repeating steps in supine.   07/17/24 Pt education regarding wearing the compression bra and going to get a new one since the patient feels like her current one is loose Supine dowel flexion x 10 STM to pectoralis Performed with pt permission supine: clavicular and sternal nodes, bil axillary nodes, left inguinal nodes, anterior interaxillary pathway, left axillo-inguinal pathway, and then left breast towards the nearest nodes/pathway, and then repeating all steps in reverse. Pt education on how to perform self MLD throughout the session  07/17/24 Pt education regarding wearing the compression bra and going to get a new one since the patient feels like her current one is loose Supine dowel flexion x 10 STM to pectoralis Performed with pt permission supine: clavicular and sternal nodes, bil axillary nodes, left inguinal nodes, anterior interaxillary pathway, left axillo-inguinal pathway, and then left breast towards the nearest nodes/pathway, and then repeating all steps in reverse. Pt education on how to perform self MLD throughout the session  07/09/24 Eval performed Very gentle STM to the UT, pectoralis, anterior chest and modified MLD to include the anterior interaxillary pathway to get pt used to this treatment of the breast  throughout discussing physiology and reasons for treatment.  Then education on post op exercises to start Gave foam chip rectangle with instruction on use    PATIENT EDUCATION:  Education details: per today's note Person educated: Patient and Child(ren) Education method: Programmer, Multimedia, Demonstration, Verbal cues, and Handouts Education comprehension: verbalized understanding and needs further education  HOME EXERCISE PROGRAM: Post-op exercises  ASSESSMENT:  CLINICAL IMPRESSION: Pt is already improved from eval session with this therapist.  She tolerated regular pressure with self MLD and was able to the pulleys and ball rolls today.  Her pain is now at 4/10 compared to 8/10.    OBJECTIVE IMPAIRMENTS: decreased activity tolerance, decreased knowledge of condition, decreased ROM, decreased strength, increased edema, increased fascial restrictions, and pain.   ACTIVITY LIMITATIONS: carrying, lifting, and sleeping  PARTICIPATION LIMITATIONS: meal prep, cleaning, laundry, driving, shopping, and community activity  PERSONAL FACTORS: Fitness , radiation hx, are also affecting patient's functional outcome.   REHAB POTENTIAL: Excellent  CLINICAL DECISION MAKING: Stable/uncomplicated  EVALUATION COMPLEXITY: Low  GOALS: Goals reviewed with patient? Yes  SHORT TERM GOALS: Target date: 07/09/24  Pt will be educated on breast lymphedema and lymphatic physiology  Baseline: Goal status: MET  2.  Pt will be educated on initial HEP  Baseline:  Goal status: MET   LONG TERM GOALS: Target date: 08/21/24  Pt will improve Left shoulder AROM to at least 160deg of flexion and abduction to improve mobility  Baseline:  Goal status: INITIAL  2.  Pt will be ind with self MLD of the breast Baseline:  Goal status: INITIAL  3.  Pt will decrease breast edema questionnaire to 25/80 or less  Baseline:  Goal status: INITIAL   PLAN:  PT FREQUENCY: 1-2x/week - 1x per wee due to transportation  most likely   PT DURATION: 6 weeks  PLANNED INTERVENTIONS: [id number]- PT Re-evaluation, 97110-Therapeutic exercises, 97530- Therapeutic activity, 97112- Neuromuscular re-education, 97535- Self Care, [id number]- Manual therapy, Patient/Family education, Balance training, Joint mobilization, Therapeutic exercises, Therapeutic activity, Neuromuscular re-education, Gait training, and Self Care, dry needling  PLAN FOR NEXT SESSION: start MLD Lt breast, STM for pain relief, PROM/AAROM,    Saddie Raw, PT 07/23/24, 12:52 PM

## 2024-07-28 ENCOUNTER — Ambulatory Visit: Payer: MEDICAID | Attending: Family Medicine | Admitting: Family Medicine

## 2024-07-28 VITALS — BP 124/82 | HR 70 | Temp 98.2°F | Ht 61.0 in | Wt 218.2 lb

## 2024-07-28 DIAGNOSIS — N951 Menopausal and female climacteric states: Secondary | ICD-10-CM | POA: Diagnosis not present

## 2024-07-28 DIAGNOSIS — M48062 Spinal stenosis, lumbar region with neurogenic claudication: Secondary | ICD-10-CM | POA: Diagnosis not present

## 2024-07-28 DIAGNOSIS — Z131 Encounter for screening for diabetes mellitus: Secondary | ICD-10-CM

## 2024-07-28 DIAGNOSIS — J4520 Mild intermittent asthma, uncomplicated: Secondary | ICD-10-CM

## 2024-07-28 DIAGNOSIS — M5010 Cervical disc disorder with radiculopathy, unspecified cervical region: Secondary | ICD-10-CM

## 2024-07-28 DIAGNOSIS — I10 Essential (primary) hypertension: Secondary | ICD-10-CM

## 2024-07-28 DIAGNOSIS — I890 Lymphedema, not elsewhere classified: Secondary | ICD-10-CM

## 2024-07-28 DIAGNOSIS — D0512 Intraductal carcinoma in situ of left breast: Secondary | ICD-10-CM

## 2024-07-28 MED ORDER — ADVAIR DISKUS 500-50 MCG/ACT IN AEPB: 1.0000 | INHALATION_SPRAY | Freq: Two times a day (BID) | RESPIRATORY_TRACT | 6 refills | Status: AC

## 2024-07-28 MED ORDER — CYCLOBENZAPRINE HCL 10 MG PO TABS: 10.0000 mg | ORAL_TABLET | Freq: Two times a day (BID) | ORAL | 1 refills | Status: AC

## 2024-07-28 MED ORDER — FUROSEMIDE 20 MG PO TABS: 20.0000 mg | ORAL_TABLET | Freq: Every day | ORAL | 3 refills | Status: DC

## 2024-07-28 MED ORDER — POTASSIUM CHLORIDE ER 10 MEQ PO TBCR: 10.0000 meq | EXTENDED_RELEASE_TABLET | Freq: Two times a day (BID) | ORAL | 1 refills | Status: DC

## 2024-07-28 MED ORDER — CLONIDINE HCL 0.2 MG PO TABS: 0.2000 mg | ORAL_TABLET | Freq: Every day | ORAL | 1 refills | Status: AC

## 2024-07-28 NOTE — Patient Instructions (Signed)
 VISIT SUMMARY:  During your visit, we addressed your fluid retention, breast cancer history, asthma, menopausal symptoms, and back pain. We also discussed general health maintenance, including vaccinations and diabetes screening.  YOUR PLAN:  -LYMPHEDEMA OF LEFT BREAST AND LOWER EXTREMITIES: Lymphedema is swelling caused by a build-up of lymph fluid, often due to cancer treatment. We will start you on furosemide  (Lasix ) with potassium supplements and monitor your potassium levels. Please include potassium-rich foods like bananas, sweet potatoes, and broccoli in your diet.  -PERSONAL HISTORY OF LEFT BREAST CANCER, STAGE 0, NO EVIDENCE OF DISEASE: You have a history of stage 0 breast cancer with no current evidence of disease. Continue taking anastrozole  (Arimidex ) as prescribed.  -ASTHMA: Asthma is a condition that affects your airways and can cause difficulty breathing. We have refilled your Advair  and ensured you have Ventolin  available for use as needed.  -VASOMOTOR MENOPAUSAL SYMPTOMS: These symptoms include hot flashes related to menopause. Continue managing these symptoms as previously discussed.  -LUMBAR SPINAL STENOSIS WITH NEUROGENIC CLAUDICATION: This condition involves narrowing of the spinal canal, which can cause back pain and discomfort, especially in cold weather. Continue with your current pain management strategies.  -GENERAL HEALTH MAINTENANCE: We discussed routine health maintenance, including vaccinations and diabetes screening. You received a flu shot and were screened for diabetes.  INSTRUCTIONS:  Please follow up for a potassium level check before starting the diuretic therapy. Continue with your current medications and dietary recommendations. If you experience any new or worsening symptoms, contact our office.

## 2024-07-28 NOTE — Progress Notes (Signed)
 Subjective:  Patient ID: [name] Ill, female    DOB: 01-Oct-1970  Age: 53 y.o. MRN: [id number]  CC: Medical Management of Chronic Issues (Discuss starting a fluid pill)     Discussed the use of AI scribe software for clinical note transcription with the patient, who gave verbal consent to proceed.  History of Present Illness [name] is a 53 year old female with a history of asthma, hypertension, depression, chronic low back pain secondary to spinal stenosis Pain is managed by [facility], degenerative disc disease of the thoracic spine, cervical stenosis, hot flashes, and chronic constipation, high-grade left breast DCIS (status post localized lumpectomy, radiation, currently on anastrozole ), history of bilateral salpingo-oophorectomy (secondary to benign ovarian tumor) who presents with fluid retention.  She experiences fluid retention, initially in her breast, now 'spreading throughout her body'. 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 She takes Arimidex  as part of her treatment regimen.  She did have a visit with her breast surgeon yesterday and has an upcoming visit with oncology.  She is on Effexor for hot flashes and takes Advair  for asthma. She recently obtained Ventolin  after difficulty accessing it.  Denies presence of asthma flares.   She takes triamterene/hydrochlorothiazide for blood pressure management prescribed by her pain management doctor -Dr. Jerrye with [facility].  She experiences back pain that worsens with cold weather. She is in the process of obtaining dentures, affecting her ability to eat certain foods, such as nuts, which impacts her dietary potassium intake.    Past Medical History:  Diagnosis Date   Allergy    pollen, pcn   Anxiety    Arthritis    Asthma    Bipolar disorder (HCC)    Breast cancer (HCC)    Cataract    Depression    Fibroids    Glaucoma     History of ovarian cyst    Hypertension    Menometrorrhagia    Ovarian cancer (HCC)    Personal history of radiation therapy    Sickle cell trait     Past Surgical History:  Procedure Laterality Date   BREAST BIOPSY Left 12/05/2023   MM LT BREAST BX W LOC DEV 1ST LESION IMAGE BX SPEC STEREO GUIDE 12/05/2023 [facility]   BREAST BIOPSY  01/08/2024   MM LT RADIOACTIVE SEED LOC MAMMO GUIDE 01/08/2024 [facility]   BREAST LUMPECTOMY Left 01/09/2024   BREAST LUMPECTOMY WITH RADIOACTIVE SEED LOCALIZATION Left 01/09/2024   Procedure: BREAST LUMPECTOMY WITH RADIOACTIVE SEED LOCALIZATION;  Surgeon: Aron Shoulders, MD;  Location: [facility];  Service: General;  Laterality: Left;  LEFT BREAST SEED LOCALIZED LUMPECTOMY   CATARACT EXTRACTION, BILATERAL     CESAREAN SECTION     all three pregnancies   COLONOSCOPY  07/04/2021   ROBOTIC ASSISTED BILATERAL SALPINGO OOPHERECTOMY Bilateral 02/04/2024   Procedure: ROBOTIC ASSISTED BILATERAL SALPINGO-OOPHORECTOMY, MINI-LAPAROTOMY FOR SPECIMEN RETRIEVAL;  Surgeon: Eldonna Mays, MD;  Location: [facility];  Service: Gynecology;  Laterality: Bilateral;  POSSBILE STAGING; POSSIBLE LAPAROTOMY    Family History  Problem Relation Age of Onset   Breast cancer Mother        dx 54s; mets   Hypertension Mother    Heart disease Father    Throat cancer Father        d. 55   Lung cancer Maternal Uncle        x3 mat uncles   Breast cancer Maternal Grandmother  dx >50; mets to brain   Lung cancer Maternal Grandfather    Breast cancer Paternal Grandmother 25   Lung cancer Paternal Grandfather    Ovarian cancer Other        MGM's sister   Other Neg Hx    Esophageal cancer Neg Hx    Rectal cancer Neg Hx    Stomach cancer Neg Hx    Prostate cancer Neg Hx    Pancreatic cancer Neg Hx    Endometrial cancer Neg Hx     Social History   Socioeconomic History   Marital status: Divorced    Spouse name: Not on file   Number of children: 3   Years  of education: Not on file   Highest education level: 12th grade  Occupational History   Not on file  Tobacco Use   Smoking status: Former    Current packs/day: 0.00    Types: Cigarettes    Quit date: 02/14/2016    Years since quitting: 8.4    Passive exposure: Never   Smokeless tobacco: Never   Tobacco comments:    3 cigarettes daily 06-22-21  Vaping Use   Vaping status: Never Used  Substance and Sexual Activity   Alcohol  use: Yes    Comment: socially   Drug use: No   Sexual activity: Yes    Partners: Male    Birth control/protection: None  Other Topics Concern   Not on file  Social History Narrative   Not on file   Social Drivers of Health   Financial Resource Strain: High Risk (07/28/2024)   Overall Financial Resource Strain (CARDIA)    Difficulty of Paying Living Expenses: Hard  Food Insecurity: Food Insecurity Present (07/28/2024)   Hunger Vital Sign    Worried About Running Out of Food in the Last Year: Sometimes true    Ran Out of Food in the Last Year: Sometimes true  Transportation Needs: No Transportation Needs (07/28/2024)   PRAPARE - Administrator, Civil Service (Medical): No    Lack of Transportation (Non-Medical): No  Physical Activity: Insufficiently Active (07/28/2024)   Exercise Vital Sign    Days of Exercise per Week: 7 days    Minutes of Exercise per Session: 20 min  Stress: No Stress Concern Present (07/28/2024)   [employer] of Occupational Health - Occupational Stress Questionnaire    Feeling of Stress: Only a little  Social Connections: Moderately Isolated (07/28/2024)   Social Connection and Isolation Panel    Frequency of Communication with Friends and Family: Twice a week    Frequency of Social Gatherings with Friends and Family: Twice a week    Attends Religious Services: 1 to 4 times per year    Active Member of [employer] or Organizations: No    Attends Engineer, Structural: Not on file    Marital Status: Divorced     Allergies  Allergen Reactions   Bupropion Hcl Er (Sr) Hives and Other (See Comments)    The patient has an entire page of reactions that can be caused by the medication, but none that she has suffered.   Fruit & Vegetable Daily [Nutritional Supplements] Other (See Comments)    all fruits causes bumps and weird feeling in her mouth   Penicillins Hives   Pollen Extract-Tree Extract Other (See Comments)    Allergies   Other Rash    bugs roaches    Outpatient Medications Prior to Visit  Medication Sig Dispense Refill   anastrozole  (  ARIMIDEX ) 1 MG tablet Take 1 tablet (1 mg total) by mouth daily. 90 tablet 3   brimonidine-timolol (COMBIGAN) 0.2-0.5 % ophthalmic solution Place 1 drop into both eyes every 12 (twelve) hours.     busPIRone  (BUSPAR ) 15 MG tablet Take 1 tablet by mouth 2 times per day (Patient taking differently: Take 15 mg by mouth in the morning.) 60 tablet 2   cariprazine  (VRAYLAR ) 1.5 MG capsule Take 2 capsules by mouth daily. (Patient taking differently: Take 1.5 mg by mouth every evening.) 60 capsule 2   cholecalciferol (VITAMIN D3) 25 MCG (1000 UNIT) tablet Take 1,000 Units by mouth in the morning.     Diclofenac Sodium 3 % GEL Apply 1 Application topically 2 (two) times daily. 100 g 0   gabapentin  (NEURONTIN ) 300 MG capsule TAKE 2 CAPSULES (600 MG TOTAL) BY MOUTH 2 (TWO) TIMES DAILY. 120 capsule 6   lactulose , encephalopathy, (CHRONULAC ) 10 GM/15ML SOLN Take 15 mLs (10 g total) by mouth 2 (two) times daily as needed for mild constipation. (Patient taking differently: Take 6.6667 g by mouth in the morning.) 946 mL 1   levocetirizine (XYZAL ) 5 MG tablet Take 1 tablet (5 mg total) by mouth every evening. (Patient taking differently: Take 5 mg by mouth in the morning.) 30 tablet 2   lidocaine  (LIDODERM ) 5 % Place 1 patch onto the skin daily. Remove & Discard patch within 12 hours or as directed by MD (Patient taking differently: Place 1 patch onto the skin daily as needed  (pain.).) 30 patch 3   LINZESS 145 MCG CAPS capsule Take 145 mcg by mouth daily.     meloxicam  (MOBIC ) 7.5 MG tablet Take 1 tablet (7.5 mg total) by mouth daily as needed. (Patient taking differently: Take 7.5 mg by mouth in the morning.) 90 tablet 1   Misc. Devices MISC 4-pronged cane.  Diagnosis-spinal stenosis 1 each 0   Misc. Devices MISC Rollator walker with seat.  Diagnosis spinal stenosis 1 each 0   naloxone (NARCAN) nasal spray 4 mg/0.1 mL 1 (ONE) SPRAY BY NOSE AS NEEDED, IF FOUND UNRESPONSIVE THEN SPRAY INTO NOSE AND CALL 911     oxyCODONE -acetaminophen  (PERCOCET) 10-325 MG tablet Take 1 tablet by mouth in the morning, at noon, in the evening, and at bedtime. (Patient taking differently: Take 1 tablet by mouth 5 (five) times daily.)     prazosin  (MINIPRESS ) 1 MG capsule Take 1 capsule by mouth at bedtime for nightmares 30 capsule 2   traZODone  (DESYREL ) 50 MG tablet TAKE 1 TABLET (50 MG TOTAL) BY MOUTH AT BEDTIME AS NEEDED. FOR SLEEP (Patient taking differently: Take 50 mg by mouth at bedtime.) 30 tablet 6   triamcinolone  cream (KENALOG ) 0.1 % Apply 1 application topically daily to arm for eczema 15 g 0   triamterene-hydrochlorothiazide (MAXZIDE-25) 37.5-25 MG tablet Take 1 tablet by mouth in the morning.     venlafaxine XR (EFFEXOR-XR) 37.5 MG 24 hr capsule Take 37.5 mg by mouth daily with breakfast.     VENTOLIN  HFA 108 (90 Base) MCG/ACT inhaler INHALE 2 PUFFS INTO THE LUNGS EVERY 6 (SIX) HOURS AS NEEDED FOR WHEEZING OR SHORTNESS OF BREATH. 18 g 2   ADVAIR  DISKUS 500-50 MCG/ACT AEPB INHALE 1 PUFF INTO THE LUNGS 2 (TWO) TIMES DAILY. 60 each 0   cloNIDine  (CATAPRES ) 0.2 MG tablet TAKE 1 TABLET (0.2 MG TOTAL) BY MOUTH AT BEDTIME. 30 tablet 0   cyclobenzaprine  (FLEXERIL ) 10 MG tablet TAKE 1 TABLET (10 MG TOTAL) BY MOUTH 2 (  TWO) TIMES DAILY AS NEEDED FOR MUSCLE SPASMS 60 tablet 0   DULoxetine  (CYMBALTA ) 60 MG capsule TAKE 1 CAPSULE (60 MG TOTAL) BY MOUTH DAILY. (Patient not taking: Reported on  07/28/2024) 30 capsule 6   FLUoxetine  (PROZAC ) 40 MG capsule Take 40 mg by mouth in the morning. (Patient not taking: Reported on 07/28/2024)     No facility-administered medications prior to visit.     ROS Review of Systems  Constitutional:  Negative for activity change and appetite change.  HENT:  Positive for dental problem. Negative for sinus pressure and sore throat.   Respiratory:  Negative for chest tightness, shortness of breath and wheezing.   Cardiovascular:  Positive for leg swelling. Negative for chest pain and palpitations.  Gastrointestinal:  Negative for abdominal distention, abdominal pain and constipation.  Genitourinary: Negative.   Musculoskeletal:  Positive for back pain.  Psychiatric/Behavioral:  Negative for behavioral problems and dysphoric mood.     Objective:  BP 124/82   Pulse 70   Temp 98.2 F (36.8 C) (Oral)   Ht 5' 1 (1.549 m)   Wt 218 lb 3.2 oz (99 kg)   LMP 03/28/2016   SpO2 99%   BMI 41.23 kg/m      07/28/2024    2:51 PM 07/06/2024    1:08 PM 03/06/2024   12:35 PM  BP/Weight  Systolic BP 124 123 122  Diastolic BP 82 75 68  Wt. (Lbs) 218.2 215.9 211  BMI 41.23 kg/m2 40.79 kg/m2 39.87 kg/m2      Physical Exam Constitutional:      Appearance: She is well-developed.  Cardiovascular:     Rate and Rhythm: Normal rate.     Heart sounds: Normal heart sounds. No murmur heard. Pulmonary:     Effort: Pulmonary effort is normal.     Breath sounds: Normal breath sounds. No wheezing or rales.  Chest:     Chest wall: No tenderness.  Abdominal:     General: Bowel sounds are normal. There is no distension.     Palpations: Abdomen is soft. There is no mass.     Tenderness: There is no abdominal tenderness.  Musculoskeletal:     Right lower leg: No edema.     Left lower leg: No edema.     Comments: Limited range of motion in lumbar spine Negative straight leg raise bilaterally  Neurological:     Mental Status: She is alert and oriented to  person, place, and time.  Psychiatric:        Mood and Affect: Mood normal.        Latest Ref Rng & Units 01/27/2024    2:36 PM 01/06/2024    3:00 PM 07/24/2023    2:40 PM  CMP  Glucose 70 - 99 mg/dL 94  868  87   BUN 6 - 20 mg/dL 16  13  8    Creatinine 0.44 - 1.00 mg/dL 8.88  8.79  9.24   Sodium 135 - 145 mmol/L 140  138  144   Potassium 3.5 - 5.1 mmol/L 3.5  3.1  4.3   Chloride 98 - 111 mmol/L 98  96  104   CO2 22 - 32 mmol/L 31  32  28   Calcium 8.9 - 10.3 mg/dL 9.7  9.0  9.2   Total Protein 6.5 - 8.1 g/dL 7.6   6.9   Total Bilirubin 0.0 - 1.2 mg/dL 1.0   0.4   Alkaline Phos 38 - 126 U/L 74  96   AST 15 - 41 U/L 22   14   ALT 0 - 44 U/L 20   7     Lipid Panel     Component Value Date/Time   CHOL 166 01/31/2022 1428   TRIG 72 01/31/2022 1428   HDL 50 01/31/2022 1428   CHOLHDL 4.3 02/09/2021 1024   CHOLHDL 3.0 05/15/2016 0949   VLDL 13 05/15/2016 0949   LDLCALC 102 (H) 01/31/2022 1428    CBC    Component Value Date/Time   WBC 9.6 01/27/2024 1436   RBC 4.77 01/27/2024 1436   HGB 12.2 01/27/2024 1436   HGB 11.5 01/31/2022 1428   HCT 38.3 01/27/2024 1436   HCT 35.5 01/31/2022 1428   PLT 381 01/27/2024 1436   PLT 344 01/31/2022 1428   MCV 80.3 01/27/2024 1436   MCV 78 (L) 01/31/2022 1428   MCH 25.6 (L) 01/27/2024 1436   MCHC 31.9 01/27/2024 1436   RDW 13.2 01/27/2024 1436   RDW 12.9 01/31/2022 1428   LYMPHSABS 2.0 01/31/2022 1428   MONOABS 0.4 06/26/2010 2057   EOSABS 0.1 01/31/2022 1428   BASOSABS 0.0 01/31/2022 1428    Lab Results  Component Value Date   HGBA1C 5.1 07/24/2023       Assessment & Plan Lymphedema  Lymphedema likely secondary to previous breast cancer treatment. Fluid accumulation in breast and legs.  - I am unable to appreciate this on exam  -Potassium levels require monitoring if diuretics are used. - Ordered potassium level before starting diuretic therapy. - Prescribed furosemide  (Lasix ) with potassium supplementation. -  Advised dietary intake of potassium-rich foods such as bananas, sweet potatoes, and broccoli.   High-grade DCIS Left breast cancer, stage 0, with no evidence of disease.  Status post lumpectomy.  Completed radiation therapy and currently on anastrozole  (Arimidex ). - Continue anastrozole  (Arimidex ) as prescribed. - Follow-up with breast surgeon and oncology  Asthma No acute flares Recent issues with obtaining Ventolin  resolved. - Refilled Advair  for asthma management. - Ensured Ventolin  is available for use as needed.  Vasomotor menopausal symptoms Symptoms include hot flashes.  - Currently on Effexor   Lumbar spinal stenosis with neurogenic claudication Chronic low back pain secondary to lumbar spinal stenosis. Pain is intermittent and exacerbated by cold exposure. - Currently under the care of pain management  General Health Maintenance Routine health maintenance discussed, including vaccinations and diabetes screening. - Administered flu shot. Screening for diabetes- Screened for diabetes.   Hypertension - Controlled - Continue triamterene/HCTZ prescribed by Dr. Jerrye, her pain management physician -Counseled on blood pressure goal of less than 130/80, low-sodium, DASH diet, medication compliance, 150 minutes of moderate intensity exercise per week. Discussed medication compliance, adverse effects.    Meds ordered this encounter  Medications   furosemide  (LASIX ) 20 MG tablet    Sig: Take 1 tablet (20 mg total) by mouth daily.    Dispense:  30 tablet    Refill:  3   potassium chloride  (KLOR-CON  10) 10 MEQ tablet    Sig: Take 1 tablet (10 mEq total) by mouth in the morning and at bedtime.    Dispense:  90 tablet    Refill:  1   ADVAIR  DISKUS 500-50 MCG/ACT AEPB    Sig: Inhale 1 puff into the lungs 2 (two) times daily.    Dispense:  60 each    Refill:  6   cloNIDine  (CATAPRES ) 0.2 MG tablet    Sig: Take 1 tablet (0.2 mg total) by mouth  at bedtime.    Dispense:   90 tablet    Refill:  1   cyclobenzaprine  (FLEXERIL ) 10 MG tablet    Sig: Take 1 tablet (10 mg total) by mouth 2 (two) times daily.    Dispense:  180 tablet    Refill:  1    Follow-up: Return in about 6 months (around 01/25/2025) for Chronic medical conditions.       Corrina Sabin, MD, FAAFP. [facility] [place], [place] [phone]   07/28/2024, 5:42 PM

## 2024-07-29 ENCOUNTER — Ambulatory Visit: Payer: Self-pay | Admitting: Family Medicine

## 2024-07-29 LAB — HEMOGLOBIN A1C
Est. average glucose Bld gHb Est-mCnc: 103 mg/dL
Hgb A1c MFr Bld: 5.2 % (ref 4.8–5.6)

## 2024-07-29 LAB — BASIC METABOLIC PANEL WITH GFR
BUN/Creatinine Ratio: 9 (ref 9–23)
BUN: 9 mg/dL (ref 6–24)
CO2: 26 mmol/L (ref 20–29)
Calcium: 9.4 mg/dL (ref 8.7–10.2)
Chloride: 102 mmol/L (ref 96–106)
Creatinine, Ser: 0.95 mg/dL (ref 0.57–1.00)
Glucose: 100 mg/dL — ABNORMAL HIGH (ref 70–99)
Potassium: 4 mmol/L (ref 3.5–5.2)
Sodium: 140 mmol/L (ref 134–144)
eGFR: 71 mL/min/1.73 (ref >59)

## 2024-07-30 ENCOUNTER — Encounter: Payer: Self-pay | Admitting: Rehabilitation

## 2024-07-30 ENCOUNTER — Ambulatory Visit: Payer: MEDICAID | Admitting: Rehabilitation

## 2024-07-30 DIAGNOSIS — D0512 Intraductal carcinoma in situ of left breast: Secondary | ICD-10-CM

## 2024-07-30 DIAGNOSIS — M25612 Stiffness of left shoulder, not elsewhere classified: Secondary | ICD-10-CM

## 2024-07-30 DIAGNOSIS — I890 Lymphedema, not elsewhere classified: Secondary | ICD-10-CM

## 2024-07-30 DIAGNOSIS — N644 Mastodynia: Secondary | ICD-10-CM

## 2024-07-30 NOTE — Therapy (Signed)
 OUTPATIENT PHYSICAL THERAPY  UPPER EXTREMITY ONCOLOGY Patient Name: [name] MRN: [id number] DOB:26-Apr-1971, 53 y.o., female Today's Date: 07/30/2024  END OF SESSION:  PT End of Session - 07/30/24 1453     Visit Number 4    Number of Visits 7    Date for Recertification  08/21/24    Authorization Type 7 visits 07/09/24-08/21/24    Authorization - Visit Number 4    Authorization - Number of Visits 7    PT Start Time 1400    PT Stop Time 1454    PT Time Calculation (min) 54 min    Activity Tolerance Patient tolerated treatment well    Behavior During Therapy WFL for tasks assessed/performed            Past Medical History:  Diagnosis Date   Allergy    pollen, pcn   Anxiety    Arthritis    Asthma    Bipolar disorder (HCC)    Breast cancer (HCC)    Cataract    Depression    Fibroids    Glaucoma    History of ovarian cyst    Hypertension    Menometrorrhagia    Ovarian cancer (HCC)    Personal history of radiation therapy    Sickle cell trait    Past Surgical History:  Procedure Laterality Date   BREAST BIOPSY Left 12/05/2023   MM LT BREAST BX W LOC DEV 1ST LESION IMAGE BX SPEC STEREO GUIDE 12/05/2023 [facility]   BREAST BIOPSY  01/08/2024   MM LT RADIOACTIVE SEED LOC MAMMO GUIDE 01/08/2024 [facility]   BREAST LUMPECTOMY Left 01/09/2024   BREAST LUMPECTOMY WITH RADIOACTIVE SEED LOCALIZATION Left 01/09/2024   Procedure: BREAST LUMPECTOMY WITH RADIOACTIVE SEED LOCALIZATION;  Surgeon: Aron Shoulders, MD;  Location: [facility];  Service: General;  Laterality: Left;  LEFT BREAST SEED LOCALIZED LUMPECTOMY   CATARACT EXTRACTION, BILATERAL     CESAREAN SECTION     all three pregnancies   COLONOSCOPY  07/04/2021   ROBOTIC ASSISTED BILATERAL SALPINGO OOPHERECTOMY Bilateral 02/04/2024   Procedure: ROBOTIC ASSISTED BILATERAL SALPINGO-OOPHORECTOMY, MINI-LAPAROTOMY FOR SPECIMEN RETRIEVAL;  Surgeon: Eldonna Mays, MD;  Location: [facility];  Service: Gynecology;   Laterality: Bilateral;  POSSBILE STAGING; POSSIBLE LAPAROTOMY   Patient Active Problem List   Diagnosis Date Noted   Genetic testing 01/27/2024   Adnexal mass 01/07/2024   Ductal carcinoma in situ (DCIS) of left breast 12/24/2023   Tobacco abuse 09/15/2018   Visual disturbance 04/24/2018   Obesity 12/12/2017   Insomnia 03/13/2017   Anxiety associated with depression 01/09/2017   Tooth ache 05/24/2016   Spinal stenosis, lumbar region, with neurogenic claudication 05/15/2016   Cervical disc disorder with radiculopathy of cervical region 05/15/2016   Vaginal atrophy 04/30/2016   Asthma 02/20/2016   Depression 02/20/2016   Myopathy 02/20/2016   Back pain 02/20/2016   Neuropathy 02/20/2016   Fibroids    History of ovarian cyst    Menometrorrhagia 04/09/2011    PCP: Corrina Sabin, MD  REFERRING PROVIDER: Morna Kendall, NP  REFERRING DIAG:  Diagnosis  N63.0 (ICD-10-CM) - Breast swelling   THERAPY DIAG:  Ductal carcinoma in situ (DCIS) of left breast  Breast pain  Stiffness of left shoulder, not elsewhere classified  Lymphedema, not elsewhere classified  ONSET DATE: 11/08/23  Rationale for Evaluation and Treatment: Rehabilitation  SUBJECTIVE:  SUBJECTIVE STATEMENT:  Patient reports that her doctor put her on water  pills. She has been wearing her compression bra and the foam chip pack. The patient reports that she feels like her shoulder motion has gotten better since she has been trying to use her left arm more.   PERTINENT HISTORY: DCIS of left breast, completed radiation 03/18/24, other hx includes scoliosis which includes pain.    PAIN:  Are you having pain? Yes NPRS scale: 5/10 Pain location: left breast Pain orientation: Left  PAIN TYPE: sharp around the inside edge and achy on  all rest  Pain description: constant  Aggravating factors: nothing really  Relieving factors: oxycodone  - more for the back   PRECAUTIONS: None  RED FLAGS: None   WEIGHT BEARING RESTRICTIONS: No  FALLS:  Has patient fallen in last 6 months? No  LIVING ENVIRONMENT: Lives with: lives with their family and lives with their daughter Has following equipment at home: Single point cane and Environmental Consultant - 2 wheeled  OCCUPATION: not working   LEISURE: nothing   HAND DOMINANCE: right   PRIOR LEVEL OF FUNCTION: Needs assistance with ADLs a nurse comes 3.5 hours per day.    PATIENT GOALS: decrease the breast pain.     OBJECTIVE: Note: Objective measures were completed at Evaluation unless otherwise noted.  COGNITION: Overall cognitive status: Within functional limits for tasks assessed   PALPATION:  +2 TTP and tightness with Lt rhomboids, lats, UT, supraclavicular fossa, inferior and medial breast with fibrosis mostly noted the inferior medial corner of the breast.    OBSERVATIONS / OTHER ASSESSMENTS: enlarged pores left medial inferior breast   SENSATION: reports occasional numbness in the Lt arm   POSTURE: holds Lt arm guarded   UPPER EXTREMITY AROM/PROM:  A/PROM RIGHT   eval   Shoulder extension 60  Shoulder flexion 165  Shoulder abduction 170  Shoulder internal rotation 70  Shoulder external rotation 86    (Blank rows = not tested)  A/PROM LEFT   eval  Shoulder extension 36 painful  Shoulder flexion AROM: 100 pain in axilla PROM: 65 pain in breast  Shoulder abduction AROM:98 pain in axilla PROM: 70 pain in breast   Shoulder internal rotation   Shoulder external rotation PROM: 45 pain in breast    (Blank rows = not tested)   CERVICAL AROM: All within normal limits: WFL but pain with Lt rotation  UPPER EXTREMITY STRENGTH:   BREAST COMPLAINTS QUESTIONNAIRE    Pain: 7 Heaviness: 7 Swollen feeling: 7 Tense Skin: 7 Redness: 0 Bra Print: 7 Size of Pores:  7 Hard feeling: 7 Total: 49/80 A Score over 9 indicates lymphedema issues in the breast                                                                                                                           TREATMENT DATE:  Pt permission and consent throughout each step of examination and treatment with modification and draping if requested  when working on sensitive areas.  Pt is aware that a clinic chaperone is not available.   07/30/24 Therapeutic Activities Pulleys into flexion and abduction x each to improve reach Wall ball flexion 5 x 5 Manual Therapy  Supine: clavicular and sternal nodes, bil axillary nodes, left inguinal nodes, anterior interaxillary pathway, left axillo-inguinal pathway, and then left breast towards the nearest nodes/pathway, and then repeating all steps in reverse. Then posterior interaxillary work and work on lateral breast in sidelying and then one more time repeating steps in supine STM to pectoralis  07/23/24 Therapeutic Activities Pulleys into flexion and abduction x each to improve reach Wall ball flexion 5 x 5 Manual Therapy  supine: clavicular and sternal nodes, bil axillary nodes, left inguinal nodes, anterior interaxillary pathway, left axillo-inguinal pathway, and then left breast towards the nearest nodes/pathway, and then repeating all steps in reverse. Then posterior interaxillary work and work on lateral breast in sidelying and then one more time repeating steps in supine.   07/17/24 Pt education regarding wearing the compression bra and going to get a new one since the patient feels like her current one is loose Supine dowel flexion x 10 STM to pectoralis Performed with pt permission supine: clavicular and sternal nodes, bil axillary nodes, left inguinal nodes, anterior interaxillary pathway, left axillo-inguinal pathway, and then left breast towards the nearest nodes/pathway, and then repeating all steps in reverse. Pt education on  how to perform self MLD throughout the session  07/17/24 Pt education regarding wearing the compression bra and going to get a new one since the patient feels like her current one is loose Supine dowel flexion x 10 STM to pectoralis Performed with pt permission supine: clavicular and sternal nodes, bil axillary nodes, left inguinal nodes, anterior interaxillary pathway, left axillo-inguinal pathway, and then left breast towards the nearest nodes/pathway, and then repeating all steps in reverse. Pt education on how to perform self MLD throughout the session  07/09/24 Eval performed Very gentle STM to the UT, pectoralis, anterior chest and modified MLD to include the anterior interaxillary pathway to get pt used to this treatment of the breast throughout discussing physiology and reasons for treatment.  Then education on post op exercises to start Gave foam chip rectangle with instruction on use    PATIENT EDUCATION:  Education details: per today's note Person educated: Patient and Child(ren) Education method: Programmer, Multimedia, Demonstration, Verbal cues, and Handouts Education comprehension: verbalized understanding and needs further education  HOME EXERCISE PROGRAM: Post-op exercises  ASSESSMENT:  CLINICAL IMPRESSION: Pt tolerates the continuation of exercises focused with improved shoulder ROM observed during pulleys. Patient tolerates the continuation of breast MLD with decreased fibrosis on the medial aspect of the breast. The patient continues to tolerate regular pressure during MLD with no increases in pain or discomfort. Patient would benefit from continued skilled physical therapy to improve the impairments listed below.   OBJECTIVE IMPAIRMENTS: decreased activity tolerance, decreased knowledge of condition, decreased ROM, decreased strength, increased edema, increased fascial restrictions, and pain.   ACTIVITY LIMITATIONS: carrying, lifting, and sleeping  PARTICIPATION  LIMITATIONS: meal prep, cleaning, laundry, driving, shopping, and community activity  PERSONAL FACTORS: Fitness , radiation hx, are also affecting patient's functional outcome.   REHAB POTENTIAL: Excellent  CLINICAL DECISION MAKING: Stable/uncomplicated  EVALUATION COMPLEXITY: Low  GOALS: Goals reviewed with patient? Yes  SHORT TERM GOALS: Target date: 07/09/24  Pt will be educated on breast lymphedema and lymphatic physiology  Baseline: Goal status: MET  2.  Pt  will be educated on initial HEP  Baseline:  Goal status: MET   LONG TERM GOALS: Target date: 08/21/24  Pt will improve Left shoulder AROM to at least 160deg of flexion and abduction to improve mobility  Baseline:  Goal status: INITIAL  2.  Pt will be ind with self MLD of the breast Baseline:  Goal status: INITIAL  3.  Pt will decrease breast edema questionnaire to 25/80 or less  Baseline:  Goal status: INITIAL   PLAN:  PT FREQUENCY: 1-2x/week - 1x per wee due to transportation most likely   PT DURATION: 6 weeks  PLANNED INTERVENTIONS: [id number]- PT Re-evaluation, 97110-Therapeutic exercises, 97530- Therapeutic activity, 97112- Neuromuscular re-education, 97535- Self Care, [id number]- Manual therapy, Patient/Family education, Balance training, Joint mobilization, Therapeutic exercises, Therapeutic activity, Neuromuscular re-education, Gait training, and Self Care, dry needling   PLAN FOR NEXT SESSION: start MLD Lt breast, STM for pain relief, PROM/AAROM,    Randall Pack, SPT  07/30/24, 3:27 PM   I agree with the following treatment note after reviewing documentation. This session was performed under the supervision of a licensed clinician.  Saddie Raw, PT 07/30/24, 3:27 PM

## 2024-07-31 ENCOUNTER — Inpatient Hospital Stay: Payer: MEDICAID | Attending: Licensed Clinical Social Worker | Admitting: Hematology and Oncology

## 2024-07-31 VITALS — BP 113/68 | HR 97 | Temp 97.4°F | Resp 18 | Ht 61.0 in | Wt 212.3 lb

## 2024-07-31 DIAGNOSIS — Z803 Family history of malignant neoplasm of breast: Secondary | ICD-10-CM | POA: Diagnosis not present

## 2024-07-31 DIAGNOSIS — Z8249 Family history of ischemic heart disease and other diseases of the circulatory system: Secondary | ICD-10-CM | POA: Diagnosis not present

## 2024-07-31 DIAGNOSIS — Z880 Allergy status to penicillin: Secondary | ICD-10-CM | POA: Diagnosis not present

## 2024-07-31 DIAGNOSIS — Z79811 Long term (current) use of aromatase inhibitors: Secondary | ICD-10-CM | POA: Insufficient documentation

## 2024-07-31 DIAGNOSIS — Z8543 Personal history of malignant neoplasm of ovary: Secondary | ICD-10-CM | POA: Diagnosis not present

## 2024-07-31 DIAGNOSIS — Z923 Personal history of irradiation: Secondary | ICD-10-CM | POA: Diagnosis not present

## 2024-07-31 DIAGNOSIS — Z79899 Other long term (current) drug therapy: Secondary | ICD-10-CM | POA: Insufficient documentation

## 2024-07-31 DIAGNOSIS — D0512 Intraductal carcinoma in situ of left breast: Secondary | ICD-10-CM | POA: Insufficient documentation

## 2024-07-31 DIAGNOSIS — Z87891 Personal history of nicotine dependence: Secondary | ICD-10-CM | POA: Diagnosis not present

## 2024-07-31 DIAGNOSIS — Z79891 Long term (current) use of opiate analgesic: Secondary | ICD-10-CM | POA: Insufficient documentation

## 2024-07-31 DIAGNOSIS — J45909 Unspecified asthma, uncomplicated: Secondary | ICD-10-CM | POA: Diagnosis not present

## 2024-07-31 DIAGNOSIS — Z86018 Personal history of other benign neoplasm: Secondary | ICD-10-CM | POA: Diagnosis not present

## 2024-07-31 DIAGNOSIS — Z9841 Cataract extraction status, right eye: Secondary | ICD-10-CM | POA: Insufficient documentation

## 2024-07-31 DIAGNOSIS — Z90722 Acquired absence of ovaries, bilateral: Secondary | ICD-10-CM | POA: Insufficient documentation

## 2024-07-31 DIAGNOSIS — Z888 Allergy status to other drugs, medicaments and biological substances status: Secondary | ICD-10-CM | POA: Insufficient documentation

## 2024-07-31 DIAGNOSIS — Z9842 Cataract extraction status, left eye: Secondary | ICD-10-CM | POA: Insufficient documentation

## 2024-07-31 DIAGNOSIS — Y842 Radiological procedure and radiotherapy as the cause of abnormal reaction of the patient, or of later complication, without mention of misadventure at the time of the procedure: Secondary | ICD-10-CM | POA: Diagnosis not present

## 2024-07-31 DIAGNOSIS — I890 Lymphedema, not elsewhere classified: Secondary | ICD-10-CM | POA: Diagnosis not present

## 2024-07-31 DIAGNOSIS — Z808 Family history of malignant neoplasm of other organs or systems: Secondary | ICD-10-CM | POA: Diagnosis not present

## 2024-07-31 DIAGNOSIS — Z59868 Other specified financial insecurity: Secondary | ICD-10-CM | POA: Diagnosis not present

## 2024-07-31 DIAGNOSIS — M8580 Other specified disorders of bone density and structure, unspecified site: Secondary | ICD-10-CM | POA: Insufficient documentation

## 2024-07-31 DIAGNOSIS — Z8041 Family history of malignant neoplasm of ovary: Secondary | ICD-10-CM | POA: Insufficient documentation

## 2024-07-31 DIAGNOSIS — Z801 Family history of malignant neoplasm of trachea, bronchus and lung: Secondary | ICD-10-CM | POA: Diagnosis not present

## 2024-07-31 DIAGNOSIS — N644 Mastodynia: Secondary | ICD-10-CM | POA: Insufficient documentation

## 2024-07-31 NOTE — Progress Notes (Signed)
 BRIEF ONCOLOGIC HISTORY:  Oncology History  Ductal carcinoma in situ (DCIS) of left breast  11/08/2023 Mammogram   In the left breast, calcifications warrant further evaluation. In the right breast, no findings suspicious for malignancy.   12/05/2023 Pathology Results   High grade DCIS, solid and clinging types with necrosis and calcs. Neg for invasive carcinoma. Insufficienct residual tumor within the deeper prognostic marker immunohistochemical stains to determine the hormone status of the tumor.   01/09/2024 Surgery   Left breast lumpectomy: DCIS, 0.62mm, margins negative 0 SLN   01/23/2024 Genetic Testing   Negative [facility] CancerNext-Expanded +RNAinsight Panel.  Report date is 01/23/2024.  The CancerNext-Expanded gene panel offered by [facility] and includes sequencing, rearrangement, and RNA analysis for the following 77 genes: AIP, ALK, APC, ATM, AXIN2, BAP1, BARD1, BMPR1A, BRCA1, BRCA2, BRIP1, CDC73, CDH1, CDK4, CDKN1B, CDKN2A, CEBPA, CHEK2, CTNNA1, DDX41, DICER1, ETV6, FH, FLCN, GATA2, LZTR1, MAX, MBD4, MEN1, MET, MLH1, MSH2, MSH3, MSH6, MUTYH, NF1, NF2, NTHL1, PALB2, PHOX2B, PMS2, POT1, PRKAR1A, PTCH1, PTEN, RAD51C, RAD51D, RB1, RET, RUNX1, RSP20, SDHA, SDHAF2, SDHB, SDHC, SDHD, SMAD4, SMARCA4, SMARCB1, SMARCE1, STK11, SUFU, TMEM127, TP53, TSC1, TSC2, VHL, and WT1 (sequencing and deletion/duplication); EGFR, HOXB13, KIT, MITF, PDGFRA, POLD1, and POLE (sequencing only); EPCAM and GREM1 (deletion/duplication only).    02/04/2024 Surgery   Bilateral salpingo-oophorectomy   02/18/2024 - 03/18/2024 Radiation Therapy   Plan Name: Breast_L_BH Site: Breast, Left Technique: 3D Mode: Photon Dose Per Fraction: 2.66 Gy Prescribed Dose (Delivered / Prescribed): 42.56 Gy / 42.56 Gy Prescribed Fxs (Delivered / Prescribed): 16 / 16   Plan Name: Breast_L_Bst Site: Breast, Left Technique: Electron Mode: Electron Dose Per Fraction: 2 Gy Prescribed Dose (Delivered / Prescribed): 8 Gy / 8  Gy Prescribed Fxs (Delivered / Prescribed): 4 / 4   02/2024 -  Anti-estrogen oral therapy   Anastrozole    07/06/2024 Cancer Staging   Staging form: Breast, AJCC 8th Edition - Pathologic: Stage 0 (pTis (DCIS), pN0, cM0) - Signed by Crawford Morna Pickle, NP on 07/06/2024     INTERVAL HISTORY:  Discussed the use of AI scribe software for clinical note transcription with the patient, who gave verbal consent to proceed.  History of Present Illness [name] is a 53 year old female with breast cancer who presents for follow-up regarding her treatment. She is accompanied by her daughter, who assists with transportation to appointments.  She experiences lymphedema and is actively engaging in exercises and therapy to manage the condition. She is on anastrozole , which causes drowsiness, managed by taking it at night. She maintains regular exercise.  She underwent a mammogram on October 30th due to a burning sensation in her breast. The results were unremarkable. She checks her breast daily, noting soreness but no lumps.  A bone density test on September 30th revealed osteopenia. She is not taking vitamin D . She engages in weight-bearing exercises like walking daily.  No blood in urine or stool and regular bowel movements.    REVIEW OF SYSTEMS:  Review of Systems  Constitutional:  Negative for appetite change, chills, fatigue, fever and unexpected weight change.  HENT:   Negative for hearing loss, lump/mass and trouble swallowing.   Eyes:  Negative for eye problems and icterus.  Respiratory:  Negative for chest tightness, cough and shortness of breath.   Cardiovascular:  Negative for chest pain, leg swelling and palpitations.  Gastrointestinal:  Negative for abdominal distention, abdominal pain, constipation, diarrhea, nausea and vomiting.  Endocrine: Negative for hot flashes.  Genitourinary:  Negative for difficulty urinating.   Musculoskeletal:  Negative for arthralgias.  Skin:   Negative for itching and rash.  Neurological:  Negative for dizziness, extremity weakness, headaches and numbness.  Hematological:  Negative for adenopathy. Does not bruise/bleed easily.  Psychiatric/Behavioral:  Negative for depression. The patient is not nervous/anxious.   Breast: Denies any new nodularity, masses, tenderness, nipple changes, or nipple discharge.       PAST MEDICAL/SURGICAL HISTORY:  Past Medical History:  Diagnosis Date   Allergy    pollen, pcn   Anxiety    Arthritis    Asthma    Bipolar disorder (HCC)    Breast cancer (HCC)    Cataract    Depression    Fibroids    Glaucoma    History of ovarian cyst    Hypertension    Menometrorrhagia    Ovarian cancer (HCC)    Personal history of radiation therapy    Sickle cell trait    Past Surgical History:  Procedure Laterality Date   BREAST BIOPSY Left 12/05/2023   MM LT BREAST BX W LOC DEV 1ST LESION IMAGE BX SPEC STEREO GUIDE 12/05/2023 [facility]   BREAST BIOPSY  01/08/2024   MM LT RADIOACTIVE SEED LOC MAMMO GUIDE 01/08/2024 [facility]   BREAST LUMPECTOMY Left 01/09/2024   BREAST LUMPECTOMY WITH RADIOACTIVE SEED LOCALIZATION Left 01/09/2024   Procedure: BREAST LUMPECTOMY WITH RADIOACTIVE SEED LOCALIZATION;  Surgeon: Aron Shoulders, MD;  Location: [facility] OR;  Service: General;  Laterality: Left;  LEFT BREAST SEED LOCALIZED LUMPECTOMY   CATARACT EXTRACTION, BILATERAL     CESAREAN SECTION     all three pregnancies   COLONOSCOPY  07/04/2021   ROBOTIC ASSISTED BILATERAL SALPINGO OOPHERECTOMY Bilateral 02/04/2024   Procedure: ROBOTIC ASSISTED BILATERAL SALPINGO-OOPHORECTOMY, MINI-LAPAROTOMY FOR SPECIMEN RETRIEVAL;  Surgeon: Eldonna Mays, MD;  Location: [facility];  Service: Gynecology;  Laterality: Bilateral;  POSSBILE STAGING; POSSIBLE LAPAROTOMY     ALLERGIES:  Allergies  Allergen Reactions   Bupropion Hcl Er (Sr) Hives and Other (See Comments)    The patient has an entire page of reactions that  can be caused by the medication, but none that she has suffered.   Fruit & Vegetable Daily [Nutritional Supplements] Other (See Comments)    all fruits causes bumps and weird feeling in her mouth   Penicillins Hives   Pollen Extract-Tree Extract Other (See Comments)    Allergies   Other Rash    bugs roaches     CURRENT MEDICATIONS:  Outpatient Encounter Medications as of 07/31/2024  Medication Sig Note   ADVAIR  DISKUS 500-50 MCG/ACT AEPB Inhale 1 puff into the lungs 2 (two) times daily.    anastrozole  (ARIMIDEX ) 1 MG tablet Take 1 tablet (1 mg total) by mouth daily.    brimonidine-timolol (COMBIGAN) 0.2-0.5 % ophthalmic solution Place 1 drop into both eyes every 12 (twelve) hours.    busPIRone  (BUSPAR ) 15 MG tablet Take 1 tablet by mouth 2 times per day (Patient taking differently: Take 15 mg by mouth in the morning.)    cariprazine  (VRAYLAR ) 1.5 MG capsule Take 2 capsules by mouth daily. (Patient taking differently: Take 1.5 mg by mouth every evening.)    cholecalciferol (VITAMIN D3) 25 MCG (1000 UNIT) tablet Take 1,000 Units by mouth in the morning.    cloNIDine  (CATAPRES ) 0.2 MG tablet Take 1 tablet (0.2 mg total) by mouth at bedtime.    cyclobenzaprine  (FLEXERIL ) 10 MG tablet Take 1 tablet (10 mg total) by mouth 2 (two) times daily.  Diclofenac Sodium 3 % GEL Apply 1 Application topically 2 (two) times daily.    furosemide  (LASIX ) 20 MG tablet Take 1 tablet (20 mg total) by mouth daily.    gabapentin  (NEURONTIN ) 300 MG capsule TAKE 2 CAPSULES (600 MG TOTAL) BY MOUTH 2 (TWO) TIMES DAILY.    lactulose , encephalopathy, (CHRONULAC ) 10 GM/15ML SOLN Take 15 mLs (10 g total) by mouth 2 (two) times daily as needed for mild constipation. (Patient taking differently: Take 6.6667 g by mouth in the morning.)    levocetirizine (XYZAL ) 5 MG tablet Take 1 tablet (5 mg total) by mouth every evening. (Patient taking differently: Take 5 mg by mouth in the morning.)    lidocaine  (LIDODERM ) 5 %  Place 1 patch onto the skin daily. Remove & Discard patch within 12 hours or as directed by MD (Patient taking differently: Place 1 patch onto the skin daily as needed (pain.).)    LINZESS 145 MCG CAPS capsule Take 145 mcg by mouth daily.    meloxicam  (MOBIC ) 7.5 MG tablet Take 1 tablet (7.5 mg total) by mouth daily as needed. (Patient taking differently: Take 7.5 mg by mouth in the morning.)    Misc. Devices MISC 4-pronged cane.  Diagnosis-spinal stenosis    Misc. Devices MISC Rollator walker with seat.  Diagnosis spinal stenosis 02/04/2024: In car if needed for admission use   naloxone (NARCAN) nasal spray 4 mg/0.1 mL 1 (ONE) SPRAY BY NOSE AS NEEDED, IF FOUND UNRESPONSIVE THEN SPRAY INTO NOSE AND CALL 911 02/04/2024: Has not needed   oxyCODONE -acetaminophen  (PERCOCET) 10-325 MG tablet Take 1 tablet by mouth in the morning, at noon, in the evening, and at bedtime. (Patient taking differently: Take 1 tablet by mouth 5 (five) times daily.)    potassium chloride  (KLOR-CON  10) 10 MEQ tablet Take 1 tablet (10 mEq total) by mouth in the morning and at bedtime.    prazosin  (MINIPRESS ) 1 MG capsule Take 1 capsule by mouth at bedtime for nightmares    traZODone  (DESYREL ) 50 MG tablet TAKE 1 TABLET (50 MG TOTAL) BY MOUTH AT BEDTIME AS NEEDED. FOR SLEEP (Patient taking differently: Take 50 mg by mouth at bedtime.)    triamcinolone  cream (KENALOG ) 0.1 % Apply 1 application topically daily to arm for eczema    triamterene-hydrochlorothiazide (MAXZIDE-25) 37.5-25 MG tablet Take 1 tablet by mouth in the morning.    venlafaxine XR (EFFEXOR-XR) 37.5 MG 24 hr capsule Take 37.5 mg by mouth daily with breakfast.    VENTOLIN  HFA 108 (90 Base) MCG/ACT inhaler INHALE 2 PUFFS INTO THE LUNGS EVERY 6 (SIX) HOURS AS NEEDED FOR WHEEZING OR SHORTNESS OF BREATH.    No facility-administered encounter medications on file as of 07/31/2024.     ONCOLOGIC FAMILY HISTORY:  Family History  Problem Relation Age of Onset   Breast  cancer Mother        dx 49s; mets   Hypertension Mother    Heart disease Father    Throat cancer Father        d. 51   Lung cancer Maternal Uncle        x3 mat uncles   Breast cancer Maternal Grandmother        dx >50; mets to brain   Lung cancer Maternal Grandfather    Breast cancer Paternal Grandmother 64   Lung cancer Paternal Grandfather    Ovarian cancer Other        MGM's sister   Other Neg Hx    Esophageal cancer Neg  Hx    Rectal cancer Neg Hx    Stomach cancer Neg Hx    Prostate cancer Neg Hx    Pancreatic cancer Neg Hx    Endometrial cancer Neg Hx      SOCIAL HISTORY:  Social History   Socioeconomic History   Marital status: Divorced    Spouse name: Not on file   Number of children: 3   Years of education: Not on file   Highest education level: 12th grade  Occupational History   Not on file  Tobacco Use   Smoking status: Former    Current packs/day: 0.00    Types: Cigarettes    Quit date: 02/14/2016    Years since quitting: 8.4    Passive exposure: Never   Smokeless tobacco: Never   Tobacco comments:    3 cigarettes daily 06-22-21  Vaping Use   Vaping status: Never Used  Substance and Sexual Activity   Alcohol  use: Yes    Comment: socially   Drug use: No   Sexual activity: Yes    Partners: Male    Birth control/protection: None  Other Topics Concern   Not on file  Social History Narrative   Not on file   Social Drivers of Health   Financial Resource Strain: High Risk (07/28/2024)   Overall Financial Resource Strain (CARDIA)    Difficulty of Paying Living Expenses: Hard  Food Insecurity: Food Insecurity Present (07/28/2024)   Hunger Vital Sign    Worried About Running Out of Food in the Last Year: Sometimes true    Ran Out of Food in the Last Year: Sometimes true  Transportation Needs: No Transportation Needs (07/28/2024)   PRAPARE - Administrator, Civil Service (Medical): No    Lack of Transportation (Non-Medical): No   Physical Activity: Insufficiently Active (07/28/2024)   Exercise Vital Sign    Days of Exercise per Week: 7 days    Minutes of Exercise per Session: 20 min  Stress: No Stress Concern Present (07/28/2024)   [employer] of Occupational Health - Occupational Stress Questionnaire    Feeling of Stress: Only a little  Social Connections: Moderately Isolated (07/28/2024)   Social Connection and Isolation Panel    Frequency of Communication with Friends and Family: Twice a week    Frequency of Social Gatherings with Friends and Family: Twice a week    Attends Religious Services: 1 to 4 times per year    Active Member of [employer] or Organizations: No    Attends Engineer, Structural: Not on file    Marital Status: Divorced  Intimate Partner Violence: Not At Risk (02/05/2024)   Humiliation, Afraid, Rape, and Kick questionnaire    Fear of Current or Ex-Partner: No    Emotionally Abused: No    Physically Abused: No    Sexually Abused: No     OBSERVATIONS/OBJECTIVE:  LMP 03/28/2016  GENERAL: Patient is a well appearing female in no acute distress HEENT:  Sclerae anicteric.  Oropharynx clear and moist. No ulcerations or evidence of oropharyngeal candidiasis. Neck is supple.  NODES:  No cervical, supraclavicular, or axillary lymphadenopathy palpated.  BREAST EXAM:  left breast s/p lumpectomy and radiation, + swelling noted, no sign of local recurrence, right breast benign    LABORATORY DATA:  None for this visit.  DIAGNOSTIC IMAGING:  None for this visit.      ASSESSMENT AND PLAN:  Ms.. Peace is a pleasant 53 y.o. female with Stage 0 left breast DCIS, ER+/PR+/HER2-,  diagnosed in 10/2023, treated with lumpectomy, adjuvant radiation therapy, and anti-estrogen therapy with Anastrozole  beginning in 02/2024. 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 Mammogram on October 30th showed no concerning findings. - Ordered diagnostic bilateral mammogram for May - Continue anastrozole  therapy.  Radiation-induced breast pain and sensitivity Persistent pain and sensitivity likely due to radiation therapy. Expected to improve over time, potentially taking up to a year for full recovery according to her discussion with radiation team  Lymphedema Managed with exercises and therapy. - Continue exercises and therapy for lymphedema management.  Osteopenia Noted on bone density scan from September 30th. Anastrozole  therapy may contribute to decreased bone density over time. - Recommended over-the-counter vitamin D  1000 IU daily. - Encouraged weight-bearing exercises such as walking.   Total encounter time:30 minutes*in face-to-face visit time, chart review, lab review, care coordination, order entry, and documentation of the encounter time.   *Total Encounter Time as defined by the Centers for Medicare and Medicaid Services includes, in addition to the face-to-face time of a patient visit (documented in the note above) non-face-to-face time: obtaining and reviewing outside history, ordering and reviewing medications, tests or procedures, care coordination (communications with other health care professionals or caregivers) and documentation in the medical record.

## 2024-08-06 ENCOUNTER — Ambulatory Visit: Payer: MEDICAID | Admitting: Rehabilitation

## 2024-08-06 ENCOUNTER — Encounter: Payer: Self-pay | Admitting: Rehabilitation

## 2024-08-06 DIAGNOSIS — M25612 Stiffness of left shoulder, not elsewhere classified: Secondary | ICD-10-CM

## 2024-08-06 DIAGNOSIS — D0512 Intraductal carcinoma in situ of left breast: Secondary | ICD-10-CM | POA: Diagnosis not present

## 2024-08-06 DIAGNOSIS — I890 Lymphedema, not elsewhere classified: Secondary | ICD-10-CM

## 2024-08-06 DIAGNOSIS — N644 Mastodynia: Secondary | ICD-10-CM

## 2024-08-06 NOTE — Therapy (Signed)
 OUTPATIENT PHYSICAL THERAPY  UPPER EXTREMITY ONCOLOGY Patient Name: [name] MRN: [id number] DOB:[birth year], 53 y.o., female Today's Date: 08/07/2024  END OF SESSION:  PT End of Session - 08/06/24 1206     Visit Number 5    Number of Visits 7    Date for Recertification  08/21/24    Authorization Type 7 visits 07/09/24-08/21/24    Authorization - Visit Number 5    Authorization - Number of Visits 7    PT Start Time 1206    PT Stop Time 1300    PT Time Calculation (min) 54 min    Activity Tolerance Patient tolerated treatment well    Behavior During Therapy WFL for tasks assessed/performed             Past Medical History:  Diagnosis Date   Allergy    pollen, pcn   Anxiety    Arthritis    Asthma    Bipolar disorder (HCC)    Breast cancer (HCC)    Cataract    Depression    Fibroids    Glaucoma    History of ovarian cyst    Hypertension    Menometrorrhagia    Ovarian cancer (HCC)    Personal history of radiation therapy    Sickle cell trait    Past Surgical History:  Procedure Laterality Date   BREAST BIOPSY Left 12/05/2023   MM LT BREAST BX W LOC DEV 1ST LESION IMAGE BX SPEC STEREO GUIDE 12/05/2023 [facility]   BREAST BIOPSY  01/08/2024   MM LT RADIOACTIVE SEED LOC MAMMO GUIDE 01/08/2024 [facility]   BREAST LUMPECTOMY Left 01/09/2024   BREAST LUMPECTOMY WITH RADIOACTIVE SEED LOCALIZATION Left 01/09/2024   Procedure: BREAST LUMPECTOMY WITH RADIOACTIVE SEED LOCALIZATION;  Surgeon: Aron Shoulders, MD;  Location: [facility];  Service: General;  Laterality: Left;  LEFT BREAST SEED LOCALIZED LUMPECTOMY   CATARACT EXTRACTION, BILATERAL     CESAREAN SECTION     all three pregnancies   COLONOSCOPY  07/04/2021   ROBOTIC ASSISTED BILATERAL SALPINGO OOPHERECTOMY Bilateral 02/04/2024   Procedure: ROBOTIC ASSISTED BILATERAL SALPINGO-OOPHORECTOMY, MINI-LAPAROTOMY FOR SPECIMEN RETRIEVAL;  Surgeon: Eldonna Mays, MD;  Location: [facility];  Service:  Gynecology;  Laterality: Bilateral;  POSSBILE STAGING; POSSIBLE LAPAROTOMY   Patient Active Problem List   Diagnosis Date Noted   Genetic testing 01/27/2024   Adnexal mass 01/07/2024   Ductal carcinoma in situ (DCIS) of left breast 12/24/2023   Tobacco abuse 09/15/2018   Visual disturbance 04/24/2018   Obesity 12/12/2017   Insomnia 03/13/2017   Anxiety associated with depression 01/09/2017   Tooth ache 05/24/2016   Spinal stenosis, lumbar region, with neurogenic claudication 05/15/2016   Cervical disc disorder with radiculopathy of cervical region 05/15/2016   Vaginal atrophy 04/30/2016   Asthma 02/20/2016   Depression 02/20/2016   Myopathy 02/20/2016   Back pain 02/20/2016   Neuropathy 02/20/2016   Fibroids    History of ovarian cyst    Menometrorrhagia 04/09/2011    PCP: Corrina Sabin, MD  REFERRING PROVIDER: Morna Kendall, NP  REFERRING DIAG:  Diagnosis  N63.0 (ICD-10-CM) - Breast swelling   THERAPY DIAG:  Ductal carcinoma in situ (DCIS) of left breast  Breast pain  Stiffness of left shoulder, not elsewhere classified  Lymphedema, not elsewhere classified  ONSET DATE: 11/08/23  Rationale for Evaluation and Treatment: Rehabilitation  SUBJECTIVE:  SUBJECTIVE STATEMENT:  Patient reports she did some weight lifting today and felt good after. She went to the oncologist on Friday, she was in a lot of pain and extremely sore following. The foam chip pack has been uncomfortable due to her sensitive skin so she has only been wearing it for 30 minutes to an hour at a time. Patient reports that she has noticed improvement in the firmness since starting physical therapy.   PERTINENT HISTORY: DCIS of left breast, completed radiation 03/18/24, other hx includes scoliosis which includes pain.     PAIN:  Are you having pain? Yes NPRS scale: 3/10 Pain location: left breast Pain orientation: Left  PAIN TYPE: sharp around the inside edge and achy on all rest  Pain description: constant  Aggravating factors: nothing really  Relieving factors: oxycodone  - more for the back   PRECAUTIONS: None  RED FLAGS: None   WEIGHT BEARING RESTRICTIONS: No  FALLS:  Has patient fallen in last 6 months? No  LIVING ENVIRONMENT: Lives with: lives with their family and lives with their daughter Has following equipment at home: Single point cane and Environmental Consultant - 2 wheeled  OCCUPATION: not working   LEISURE: nothing   HAND DOMINANCE: right   PRIOR LEVEL OF FUNCTION: Needs assistance with ADLs a nurse comes 3.5 hours per day.    PATIENT GOALS: decrease the breast pain.     OBJECTIVE: Note: Objective measures were completed at Evaluation unless otherwise noted.  COGNITION: Overall cognitive status: Within functional limits for tasks assessed   PALPATION:  +2 TTP and tightness with Lt rhomboids, lats, UT, supraclavicular fossa, inferior and medial breast with fibrosis mostly noted the inferior medial corner of the breast.    OBSERVATIONS / OTHER ASSESSMENTS: enlarged pores left medial inferior breast   SENSATION: reports occasional numbness in the Lt arm   POSTURE: holds Lt arm guarded   UPPER EXTREMITY AROM/PROM:  A/PROM RIGHT   eval   Shoulder extension 60  Shoulder flexion 165  Shoulder abduction 170  Shoulder internal rotation 70  Shoulder external rotation 86    (Blank rows = not tested)  A/PROM LEFT   eval  Shoulder extension 36 painful  Shoulder flexion AROM: 100 pain in axilla PROM: 65 pain in breast  Shoulder abduction AROM:98 pain in axilla PROM: 70 pain in breast   Shoulder internal rotation   Shoulder external rotation PROM: 45 pain in breast    (Blank rows = not tested)   CERVICAL AROM: All within normal limits: WFL but pain with Lt rotation  UPPER  EXTREMITY STRENGTH:   BREAST COMPLAINTS QUESTIONNAIRE    Pain: 7 Heaviness: 7 Swollen feeling: 7 Tense Skin: 7 Redness: 0 Bra Print: 7 Size of Pores: 7 Hard feeling: 7 Total: 49/80 A Score over 9 indicates lymphedema issues in the breast  TREATMENT DATE:  Pt permission and consent throughout each step of examination and treatment with modification and draping if requested when working on sensitive areas.  Pt is aware that a clinic chaperone is not available.   08/06/24 Therapeutic Activities Pulleys into flexion and abduction x each to improve reach Wall ball flexion 5 x 5 Manual Therapy  Supine: clavicular and sternal nodes, bil axillary nodes, left inguinal nodes, anterior interaxillary pathway, left axillo-inguinal pathway, and then left breast towards the nearest nodes/pathway, and then repeating all steps in reverse. Then posterior interaxillary work and work on lateral breast in sidelying and then one more time repeating steps in supine STM to pectoralis  07/30/24 Therapeutic Activities Pulleys into flexion and abduction x each to improve reach Wall ball flexion 5 x 5 Manual Therapy  Supine: clavicular and sternal nodes, bil axillary nodes, left inguinal nodes, anterior interaxillary pathway, left axillo-inguinal pathway, and then left breast towards the nearest nodes/pathway, and then repeating all steps in reverse. Then posterior interaxillary work and work on lateral breast in sidelying and then one more time repeating steps in supine STM to pectoralis  07/23/24 Therapeutic Activities Pulleys into flexion and abduction x each to improve reach Wall ball flexion 5 x 5 Manual Therapy  supine: clavicular and sternal nodes, bil axillary nodes, left inguinal nodes, anterior interaxillary pathway, left axillo-inguinal pathway, and then left  breast towards the nearest nodes/pathway, and then repeating all steps in reverse. Then posterior interaxillary work and work on lateral breast in sidelying and then one more time repeating steps in supine.     PATIENT EDUCATION:  Education details: per today's note Person educated: Patient and Child(ren) Education method: Explanation, Demonstration, Verbal cues, and Handouts Education comprehension: verbalized understanding and needs further education  HOME EXERCISE PROGRAM: Post-op exercises  ASSESSMENT:  CLINICAL IMPRESSION: Pt tolerates the continuation of her POC. Breast MLD is continued with decreased fibrosis at the medial aspect of the left breast. However, the patient continues to have fibrosis at the inferior portion of her breast. The patient is provided a covering for the chip pack since she has sensitive skin. The patient is instructed to stop wearing the chip pack if it causes pain/discomfort or skin irritation. Patient would benefit from continued skilled physical therapy to improve the impairments listed below. Will add order in [facility] for a swell spot to see if it is covered.    OBJECTIVE IMPAIRMENTS: decreased activity tolerance, decreased knowledge of condition, decreased ROM, decreased strength, increased edema, increased fascial restrictions, and pain.   ACTIVITY LIMITATIONS: carrying, lifting, and sleeping  PARTICIPATION LIMITATIONS: meal prep, cleaning, laundry, driving, shopping, and community activity  PERSONAL FACTORS: Fitness , radiation hx, are also affecting patient's functional outcome.   REHAB POTENTIAL: Excellent  CLINICAL DECISION MAKING: Stable/uncomplicated  EVALUATION COMPLEXITY: Low  GOALS: Goals reviewed with patient? Yes  SHORT TERM GOALS: Target date: 07/09/24  Pt will be educated on breast lymphedema and lymphatic physiology  Baseline: Goal status: MET  2.  Pt will be educated on initial HEP  Baseline:  Goal status:  MET   LONG TERM GOALS: Target date: 08/21/24  Pt will improve Left shoulder AROM to at least 160deg of flexion and abduction to improve mobility  Baseline:  Goal status: INITIAL  2.  Pt will be ind with self MLD of the breast Baseline:  Goal status: INITIAL  3.  Pt will decrease breast edema questionnaire to 25/80 or less  Baseline:  Goal status: INITIAL  PLAN:  PT FREQUENCY: 1-2x/week - 1x per wee due to transportation most likely   PT DURATION: 6 weeks  PLANNED INTERVENTIONS: 97164- PT Re-evaluation, 97110-Therapeutic exercises, 97530- Therapeutic activity, 97112- Neuromuscular re-education, 97535- Self Care, [id number]- Manual therapy, Patient/Family education, Balance training, Joint mobilization, Therapeutic exercises, Therapeutic activity, Neuromuscular re-education, Gait training, and Self Care, dry needling   PLAN FOR NEXT SESSION: start MLD Lt breast, STM for pain relief, PROM/AAROM, re-assessment - measure ROM & review goals  Randall Pack, SPT  08/07/24, 8:21 AM   I agree with the following treatment note after reviewing documentation. This session was performed under the supervision of a licensed clinician.  Saddie Raw, PT 08/07/24, 8:21 AM

## 2024-08-20 ENCOUNTER — Encounter: Payer: Self-pay | Admitting: Rehabilitation

## 2024-08-20 ENCOUNTER — Ambulatory Visit: Payer: MEDICAID | Attending: Adult Health | Admitting: Rehabilitation

## 2024-08-20 DIAGNOSIS — M25612 Stiffness of left shoulder, not elsewhere classified: Secondary | ICD-10-CM | POA: Insufficient documentation

## 2024-08-20 DIAGNOSIS — D0512 Intraductal carcinoma in situ of left breast: Secondary | ICD-10-CM | POA: Diagnosis present

## 2024-08-20 DIAGNOSIS — I890 Lymphedema, not elsewhere classified: Secondary | ICD-10-CM | POA: Diagnosis present

## 2024-08-20 DIAGNOSIS — N644 Mastodynia: Secondary | ICD-10-CM | POA: Insufficient documentation

## 2024-08-20 NOTE — Therapy (Signed)
 OUTPATIENT PHYSICAL THERAPY  UPPER EXTREMITY ONCOLOGY Patient Name: [name] MRN: [id number] DOB:1971-01-03, 53 y.o., female Today's Date: 08/20/2024  END OF SESSION:  PT End of Session - 08/20/24 1208     Visit Number 6    Number of Visits 10    Date for Recertification  09/17/24    Authorization Type 7 visits 07/09/24-08/21/24    Authorization - Visit Number 6    Authorization - Number of Visits 7    PT Start Time 1207    PT Stop Time 1300    PT Time Calculation (min) 53 min    Activity Tolerance Patient tolerated treatment well    Behavior During Therapy WFL for tasks assessed/performed              Past Medical History:  Diagnosis Date   Allergy    pollen, pcn   Anxiety    Arthritis    Asthma    Bipolar disorder (HCC)    Breast cancer (HCC)    Cataract    Depression    Fibroids    Glaucoma    History of ovarian cyst    Hypertension    Menometrorrhagia    Ovarian cancer (HCC)    Personal history of radiation therapy    Sickle cell trait    Past Surgical History:  Procedure Laterality Date   BREAST BIOPSY Left 12/05/2023   MM LT BREAST BX W LOC DEV 1ST LESION IMAGE BX SPEC STEREO GUIDE 12/05/2023 [facility]   BREAST BIOPSY  01/08/2024   MM LT RADIOACTIVE SEED LOC MAMMO GUIDE 01/08/2024 [facility]   BREAST LUMPECTOMY Left 01/09/2024   BREAST LUMPECTOMY WITH RADIOACTIVE SEED LOCALIZATION Left 01/09/2024   Procedure: BREAST LUMPECTOMY WITH RADIOACTIVE SEED LOCALIZATION;  Surgeon: Aron Shoulders, MD;  Location: [facility];  Service: General;  Laterality: Left;  LEFT BREAST SEED LOCALIZED LUMPECTOMY   CATARACT EXTRACTION, BILATERAL     CESAREAN SECTION     all three pregnancies   COLONOSCOPY  07/04/2021   ROBOTIC ASSISTED BILATERAL SALPINGO OOPHERECTOMY Bilateral 02/04/2024   Procedure: ROBOTIC ASSISTED BILATERAL SALPINGO-OOPHORECTOMY, MINI-LAPAROTOMY FOR SPECIMEN RETRIEVAL;  Surgeon: Eldonna Mays, MD;  Location: [facility];  Service:  Gynecology;  Laterality: Bilateral;  POSSBILE STAGING; POSSIBLE LAPAROTOMY   Patient Active Problem List   Diagnosis Date Noted   Genetic testing 01/27/2024   Adnexal mass 01/07/2024   Ductal carcinoma in situ (DCIS) of left breast 12/24/2023   Tobacco abuse 09/15/2018   Visual disturbance 04/24/2018   Obesity 12/12/2017   Insomnia 03/13/2017   Anxiety associated with depression 01/09/2017   Tooth ache 05/24/2016   Spinal stenosis, lumbar region, with neurogenic claudication 05/15/2016   Cervical disc disorder with radiculopathy of cervical region 05/15/2016   Vaginal atrophy 04/30/2016   Asthma 02/20/2016   Depression 02/20/2016   Myopathy 02/20/2016   Back pain 02/20/2016   Neuropathy 02/20/2016   Fibroids    History of ovarian cyst    Menometrorrhagia 04/09/2011    PCP: Corrina Sabin, MD  REFERRING PROVIDER: Morna Kendall, NP  REFERRING DIAG:  Diagnosis  N63.0 (ICD-10-CM) - Breast swelling   THERAPY DIAG:  Ductal carcinoma in situ (DCIS) of left breast  Breast pain  Stiffness of left shoulder, not elsewhere classified  Lymphedema, not elsewhere classified  ONSET DATE: 11/08/23  Rationale for Evaluation and Treatment: Rehabilitation  SUBJECTIVE:  SUBJECTIVE STATEMENT:  Patient reports that she feels like she has seen improvement in the firmness and swelling of the breast but it is not fully resolved. She would like to continue physical therapy for a few more weeks.   PERTINENT HISTORY: DCIS of left breast, completed radiation 03/18/24, other hx includes scoliosis which includes pain.    PAIN:  Are you having pain? Yes NPRS scale: 3/10 Pain location: left breast Pain orientation: Left  PAIN TYPE: sharp around the inside edge and achy on all rest  Pain description: constant   Aggravating factors: nothing really  Relieving factors: oxycodone  - more for the back   PRECAUTIONS: None  RED FLAGS: None   WEIGHT BEARING RESTRICTIONS: No  FALLS:  Has patient fallen in last 6 months? No  LIVING ENVIRONMENT: Lives with: lives with their family and lives with their daughter Has following equipment at home: Single point cane and Environmental Consultant - 2 wheeled  OCCUPATION: not working   LEISURE: nothing   HAND DOMINANCE: right   PRIOR LEVEL OF FUNCTION: Needs assistance with ADLs a nurse comes 3.5 hours per day.    PATIENT GOALS: decrease the breast pain.     OBJECTIVE: Note: Objective measures were completed at Evaluation unless otherwise noted.  COGNITION: Overall cognitive status: Within functional limits for tasks assessed   PALPATION:  +2 TTP and tightness with Lt rhomboids, lats, UT, supraclavicular fossa, inferior and medial breast with fibrosis mostly noted the inferior medial corner of the breast.    OBSERVATIONS / OTHER ASSESSMENTS: enlarged pores left medial inferior breast   SENSATION: reports occasional numbness in the Lt arm   POSTURE: holds Lt arm guarded   UPPER EXTREMITY AROM/PROM:  A/PROM RIGHT   eval   Shoulder extension 60  Shoulder flexion 165  Shoulder abduction 170  Shoulder internal rotation 70  Shoulder external rotation 86    (Blank rows = not tested)  A/PROM LEFT   eval LEFT 08/20/24  Shoulder extension 36 painful 53 pain in breast  Shoulder flexion AROM: 100 pain in axilla PROM: 65 pain in breast 152 pain in axilla  Shoulder abduction AROM:98 pain in axilla PROM: 70 pain in breast  155 pain in arm & breast  Shoulder internal rotation    Shoulder external rotation PROM: 45 pain in breast     (Blank rows = not tested)   CERVICAL AROM: All within normal limits: WFL but pain with Lt rotation  UPPER EXTREMITY STRENGTH:  Eval: BREAST COMPLAINTS QUESTIONNAIRE    Pain: 7 Heaviness: 7 Swollen feeling: 7 Tense Skin:  7 Redness: 0 Bra Print: 7 Size of Pores: 7 Hard feeling: 7 Total: 49/80 A Score over 9 indicates lymphedema issues in the breast  08/20/24: BREAST COMPLAINTS QUESTIONNAIRE    Pain: 3 Heaviness: 3 Swollen feeling: 3 Tense Skin: 3 Redness: 3 Bra Print: 3 Size of Pores: 3 Hard feeling: 3 Total: 24/80  TREATMENT DATE:  Pt permission and consent throughout each step of examination and treatment with modification and draping if requested when working on sensitive areas.  Pt is aware that a clinic chaperone is not available.   08/20/24 Manual Therapy  Supine: clavicular and sternal nodes, bil axillary nodes, left inguinal nodes, anterior interaxillary pathway, left axillo-inguinal pathway, and then left breast towards the nearest nodes/pathway, and then repeating all steps in reverse. Then posterior interaxillary work and work on lateral breast in sidelying and then one more time repeating steps in supine - also included MLD for proximal UE STM to pectoralis Re-assessed objective measurements & updated goals  08/06/24 Therapeutic Activities Pulleys into flexion and abduction x each to improve reach Wall ball flexion 5 x 5 Manual Therapy  Supine: clavicular and sternal nodes, bil axillary nodes, left inguinal nodes, anterior interaxillary pathway, left axillo-inguinal pathway, and then left breast towards the nearest nodes/pathway, and then repeating all steps in reverse. Then posterior interaxillary work and work on lateral breast in sidelying and then one more time repeating steps in supine STM to pectoralis  07/30/24 Therapeutic Activities Pulleys into flexion and abduction x each to improve reach Wall ball flexion 5 x 5 Manual Therapy  Supine: clavicular and sternal nodes, bil axillary nodes, left inguinal nodes, anterior interaxillary pathway, left  axillo-inguinal pathway, and then left breast towards the nearest nodes/pathway, and then repeating all steps in reverse. Then posterior interaxillary work and work on lateral breast in sidelying and then one more time repeating steps in supine STM to pectoralis  07/23/24 Therapeutic Activities Pulleys into flexion and abduction x each to improve reach Wall ball flexion 5 x 5 Manual Therapy  supine: clavicular and sternal nodes, bil axillary nodes, left inguinal nodes, anterior interaxillary pathway, left axillo-inguinal pathway, and then left breast towards the nearest nodes/pathway, and then repeating all steps in reverse. Then posterior interaxillary work and work on lateral breast in sidelying and then one more time repeating steps in supine.    PATIENT EDUCATION:  Education details: per today's note Person educated: Patient and Child(ren) Education method: Programmer, Multimedia, Facilities Manager, Verbal cues, and Handouts Education comprehension: verbalized understanding and needs further education  HOME EXERCISE PROGRAM: Post-op exercises  ASSESSMENT:  CLINICAL IMPRESSION: Patient has made progress towards her goals but has not met all of the long-term goals. The patient has an improved breast edema questionnaire score, improved shoulder AROM; however, she continues to have pain, fibrosis at the inferior-medial aspect of the breast, and pain in the axilla/breast with all shoulder AROM at end-range. Medial UE MLD is incorporated due to the patient reporting slighting increased edema in the lateral aspect of the breast and medial aspect of the proximal UE. The patient would benefit from continued skilled physical therapy to improve QoL, functional mobility, decrease pain and improve edema.   OBJECTIVE IMPAIRMENTS: decreased activity tolerance, decreased knowledge of condition, decreased ROM, decreased strength, increased edema, increased fascial restrictions, and pain.   ACTIVITY LIMITATIONS:  carrying, lifting, and sleeping  PARTICIPATION LIMITATIONS: meal prep, cleaning, laundry, driving, shopping, and community activity  PERSONAL FACTORS: Fitness , radiation hx, are also affecting patient's functional outcome.   REHAB POTENTIAL: Excellent  CLINICAL DECISION MAKING: Stable/uncomplicated  EVALUATION COMPLEXITY: Low  GOALS: Goals reviewed with patient? Yes  SHORT TERM GOALS: Target date: 07/09/24  Pt will be educated on breast lymphedema and lymphatic physiology  Baseline: Goal status: MET  2.  Pt will be educated on initial HEP  Baseline:  Goal status: MET   LONG TERM GOALS: Target date: 09/17/24  Pt will improve Left shoulder AROM to at least 160deg of flexion and abduction to improve mobility  Baseline:  Goal status: Ongoing   2.  Pt will be ind with self MLD of the breast Baseline:  Goal status: MET  3.  Pt will decrease breast edema questionnaire to 25/80 or less  Baseline:  Goal status: MET  4.  Pt will have pain free Lt shoulder AROM Baseline:  Goal status: Ongoing   5.  Pt will obtain a swell spot to decrease breast fibrosis  Baseline:  Goal status: Ongoing   PLAN:  PT FREQUENCY: 1x/week    PT DURATION: 4 weeks  PLANNED INTERVENTIONS: 97164- PT Re-evaluation, 97110-Therapeutic exercises, 97530- Therapeutic activity, 97112- Neuromuscular re-education, 97535- Self Care, 02859- Manual therapy, Patient/Family education, Balance training, Joint mobilization, Therapeutic exercises, Therapeutic activity, Neuromuscular re-education, Gait training, and Self Care, dry needling   PLAN FOR NEXT SESSION: continue MLD Lt breast & medial upper arm PRN, STM for pain relief, PROM/AAROM   Randall Pack, SPT  08/20/24, 10:33 PM   I agree with the following treatment note after reviewing documentation. This session was performed under the supervision of a licensed clinician.  Saddie Raw, PT 08/20/24, 10:33 PM

## 2024-08-27 ENCOUNTER — Ambulatory Visit: Payer: MEDICAID

## 2024-08-27 DIAGNOSIS — M25612 Stiffness of left shoulder, not elsewhere classified: Secondary | ICD-10-CM

## 2024-08-27 DIAGNOSIS — D0512 Intraductal carcinoma in situ of left breast: Secondary | ICD-10-CM

## 2024-08-27 DIAGNOSIS — I890 Lymphedema, not elsewhere classified: Secondary | ICD-10-CM

## 2024-08-27 DIAGNOSIS — N644 Mastodynia: Secondary | ICD-10-CM

## 2024-08-27 NOTE — Therapy (Signed)
 OUTPATIENT PHYSICAL THERAPY  UPPER EXTREMITY ONCOLOGY Patient Name: [name] MRN: [id number] DOB:Apr 23, 1971, 53 y.o., female Today's Date: 08/27/2024  END OF SESSION:  PT End of Session - 08/27/24 1209     Visit Number 7    Number of Visits 10    Date for Recertification  09/17/24    Authorization Type 7 visits 07/09/24-08/21/24; 4 more visits 08/21/24-09/25/24    Authorization - Visit Number 7    Authorization - Number of Visits 11    PT Start Time 1205    PT Stop Time 1304    PT Time Calculation (min) 59 min    Activity Tolerance Patient tolerated treatment well    Behavior During Therapy WFL for tasks assessed/performed              Past Medical History:  Diagnosis Date   Allergy    pollen, pcn   Anxiety    Arthritis    Asthma    Bipolar disorder (HCC)    Breast cancer (HCC)    Cataract    Depression    Fibroids    Glaucoma    History of ovarian cyst    Hypertension    Menometrorrhagia    Ovarian cancer (HCC)    Personal history of radiation therapy    Sickle cell trait    Past Surgical History:  Procedure Laterality Date   BREAST BIOPSY Left 12/05/2023   MM LT BREAST BX W LOC DEV 1ST LESION IMAGE BX SPEC STEREO GUIDE 12/05/2023 [facility]   BREAST BIOPSY  01/08/2024   MM LT RADIOACTIVE SEED LOC MAMMO GUIDE 01/08/2024 [facility]   BREAST LUMPECTOMY Left 01/09/2024   BREAST LUMPECTOMY WITH RADIOACTIVE SEED LOCALIZATION Left 01/09/2024   Procedure: BREAST LUMPECTOMY WITH RADIOACTIVE SEED LOCALIZATION;  Surgeon: Aron Shoulders, MD;  Location: [facility];  Service: General;  Laterality: Left;  LEFT BREAST SEED LOCALIZED LUMPECTOMY   CATARACT EXTRACTION, BILATERAL     CESAREAN SECTION     all three pregnancies   COLONOSCOPY  07/04/2021   ROBOTIC ASSISTED BILATERAL SALPINGO OOPHERECTOMY Bilateral 02/04/2024   Procedure: ROBOTIC ASSISTED BILATERAL SALPINGO-OOPHORECTOMY, MINI-LAPAROTOMY FOR SPECIMEN RETRIEVAL;  Surgeon: Eldonna Mays, MD;   Location: [facility];  Service: Gynecology;  Laterality: Bilateral;  POSSBILE STAGING; POSSIBLE LAPAROTOMY   Patient Active Problem List   Diagnosis Date Noted   Genetic testing 01/27/2024   Adnexal mass 01/07/2024   Ductal carcinoma in situ (DCIS) of left breast 12/24/2023   Tobacco abuse 09/15/2018   Visual disturbance 04/24/2018   Obesity 12/12/2017   Insomnia 03/13/2017   Anxiety associated with depression 01/09/2017   Tooth ache 05/24/2016   Spinal stenosis, lumbar region, with neurogenic claudication 05/15/2016   Cervical disc disorder with radiculopathy of cervical region 05/15/2016   Vaginal atrophy 04/30/2016   Asthma 02/20/2016   Depression 02/20/2016   Myopathy 02/20/2016   Back pain 02/20/2016   Neuropathy 02/20/2016   Fibroids    History of ovarian cyst    Menometrorrhagia 04/09/2011    PCP: Corrina Sabin, MD  REFERRING PROVIDER: Morna Kendall, NP  REFERRING DIAG:  Diagnosis  N63.0 (ICD-10-CM) - Breast swelling   THERAPY DIAG:  Ductal carcinoma in situ (DCIS) of left breast  Breast pain  Stiffness of left shoulder, not elsewhere classified  Lymphedema, not elsewhere classified  ONSET DATE: 11/08/23  Rationale for Evaluation and Treatment: Rehabilitation  SUBJECTIVE:  SUBJECTIVE STATEMENT:  Today the inside of my breast feels really hard.   PERTINENT HISTORY: DCIS of left breast, completed radiation 03/18/24, other hx includes scoliosis which includes pain.    PAIN:  Are you having pain? Yes NPRS scale: 3-4/10 Pain location: left breast Pain orientation: Left  PAIN TYPE: medial aspect of breast Pain description: constant  Aggravating factors: increased breast lymphedema  Relieving factors: MLD helps   PRECAUTIONS: None  RED FLAGS: None   WEIGHT BEARING  RESTRICTIONS: No  FALLS:  Has patient fallen in last 6 months? No  LIVING ENVIRONMENT: Lives with: lives with their family and lives with their daughter Has following equipment at home: Single point cane and Environmental Consultant - 2 wheeled  OCCUPATION: not working   LEISURE: nothing   HAND DOMINANCE: right   PRIOR LEVEL OF FUNCTION: Needs assistance with ADLs a nurse comes 3.5 hours per day.    PATIENT GOALS: decrease the breast pain.     OBJECTIVE: Note: Objective measures were completed at Evaluation unless otherwise noted.  COGNITION: Overall cognitive status: Within functional limits for tasks assessed   PALPATION:  +2 TTP and tightness with Lt rhomboids, lats, UT, supraclavicular fossa, inferior and medial breast with fibrosis mostly noted the inferior medial corner of the breast.    OBSERVATIONS / OTHER ASSESSMENTS: enlarged pores left medial inferior breast   SENSATION: reports occasional numbness in the Lt arm   POSTURE: holds Lt arm guarded   UPPER EXTREMITY AROM/PROM:  A/PROM RIGHT   eval   Shoulder extension 60  Shoulder flexion 165  Shoulder abduction 170  Shoulder internal rotation 70  Shoulder external rotation 86    (Blank rows = not tested)  A/PROM LEFT   eval LEFT 08/20/24  Shoulder extension 36 painful 53 pain in breast  Shoulder flexion AROM: 100 pain in axilla PROM: 65 pain in breast 152 pain in axilla  Shoulder abduction AROM:98 pain in axilla PROM: 70 pain in breast  155 pain in arm & breast  Shoulder internal rotation    Shoulder external rotation PROM: 45 pain in breast     (Blank rows = not tested)   CERVICAL AROM: All within normal limits: WFL but pain with Lt rotation  UPPER EXTREMITY STRENGTH:  Eval: BREAST COMPLAINTS QUESTIONNAIRE    Pain: 7 Heaviness: 7 Swollen feeling: 7 Tense Skin: 7 Redness: 0 Bra Print: 7 Size of Pores: 7 Hard feeling: 7 Total: 49/80 A Score over 9 indicates lymphedema issues in the  breast  08/20/24: BREAST COMPLAINTS QUESTIONNAIRE    Pain: 3 Heaviness: 3 Swollen feeling: 3 Tense Skin: 3 Redness: 3 Bra Print: 3 Size of Pores: 3 Hard feeling: 3 Total: 24/80                                                                                                                           TREATMENT DATE:  Pt permission and consent throughout each step of examination and treatment with modification and  draping if requested when working on sensitive areas.  Pt is aware that a clinic chaperone is not available.  08/27/24: Manual Therapy  Supine: short neck, superficial and deep abdominals, left inguinal nodes, left axillo-inguinal anastomosis, Rt axillary and pectoral nodes, intact upper quadrant sequence, anterior interaxillary anastomosis and then left superior and medial breast redirecting towards anterior anastomosis spending extra time over area of fibrosis,then into Rt S/L for focus to lateral and inferior breast redirecting towards lateral anastomosis, then finished retracing steps in supine and reviewed correct skin stretch and pressure with pt. She reports has been doing her self MLD with cocoa butter, advised her to wait to use cocoa butter after MLD so she can perform more effective skin stretch sans sliding.   08/20/24 Manual Therapy  Supine: clavicular and sternal nodes, bil axillary nodes, left inguinal nodes, anterior interaxillary pathway, left axillo-inguinal pathway, and then left breast towards the nearest nodes/pathway, and then repeating all steps in reverse. Then posterior interaxillary work and work on lateral breast in sidelying and then one more time repeating steps in supine - also included MLD for proximal UE STM to pectoralis Re-assessed objective measurements & updated goals  08/06/24 Therapeutic Activities Pulleys into flexion and abduction x each to improve reach Wall ball flexion 5 x 5 Manual Therapy  Supine: clavicular and sternal nodes, bil  axillary nodes, left inguinal nodes, anterior interaxillary pathway, left axillo-inguinal pathway, and then left breast towards the nearest nodes/pathway, and then repeating all steps in reverse. Then posterior interaxillary work and work on lateral breast in sidelying and then one more time repeating steps in supine STM to pectoralis  07/30/24 Therapeutic Activities Pulleys into flexion and abduction x each to improve reach Wall ball flexion 5 x 5 Manual Therapy  Supine: clavicular and sternal nodes, bil axillary nodes, left inguinal nodes, anterior interaxillary pathway, left axillo-inguinal pathway, and then left breast towards the nearest nodes/pathway, and then repeating all steps in reverse. Then posterior interaxillary work and work on lateral breast in sidelying and then one more time repeating steps in supine STM to pectoralis      PATIENT EDUCATION:  Education details: per today's note Person educated: Patient and Psychologist, Sport And Exercise) Education method: Programmer, Multimedia, Facilities Manager, Verbal cues, and Handouts Education comprehension: verbalized understanding and needs further education  HOME EXERCISE PROGRAM: Post-op exercises  ASSESSMENT:  CLINICAL IMPRESSION: Spent extra time during MLD focusing on medial breast where pt most fibrotic. Added intact sequence as well and pt had very good palpable reduction of fibrosis here by end of session. She also reports noting good improvement. Encouraged her to take her time focusing on firmer areas to get similar results at home. She verbalized good understanding.   OBJECTIVE IMPAIRMENTS: decreased activity tolerance, decreased knowledge of condition, decreased ROM, decreased strength, increased edema, increased fascial restrictions, and pain.   ACTIVITY LIMITATIONS: carrying, lifting, and sleeping  PARTICIPATION LIMITATIONS: meal prep, cleaning, laundry, driving, shopping, and community activity  PERSONAL FACTORS: Fitness , radiation hx,  are also affecting patient's functional outcome.   REHAB POTENTIAL: Excellent  CLINICAL DECISION MAKING: Stable/uncomplicated  EVALUATION COMPLEXITY: Low  GOALS: Goals reviewed with patient? Yes  SHORT TERM GOALS: Target date: 07/09/24  Pt will be educated on breast lymphedema and lymphatic physiology  Baseline: Goal status: MET  2.  Pt will be educated on initial HEP  Baseline:  Goal status: MET   LONG TERM GOALS: Target date: 09/17/24  Pt will improve Left shoulder AROM to at least 160deg of flexion  and abduction to improve mobility  Baseline:  Goal status: Ongoing   2.  Pt will be ind with self MLD of the breast Baseline:  Goal status: MET  3.  Pt will decrease breast edema questionnaire to 25/80 or less  Baseline:  Goal status: MET  4.  Pt will have pain free Lt shoulder AROM Baseline:  Goal status: Ongoing   5.  Pt will obtain a swell spot to decrease breast fibrosis  Baseline:  Goal status: Ongoing   PLAN:  PT FREQUENCY: 1x/week    PT DURATION: 4 weeks  PLANNED INTERVENTIONS: 97164- PT Re-evaluation, 97110-Therapeutic exercises, 97530- Therapeutic activity, 97112- Neuromuscular re-education, 97535- Self Care, [id number]- Manual therapy, Patient/Family education, Balance training, Joint mobilization, Therapeutic exercises, Therapeutic activity, Neuromuscular re-education, Gait training, and Self Care, dry needling   PLAN FOR NEXT SESSION: continue MLD Lt breast & medial upper arm PRN, STM for pain relief, PROM/AAROM

## 2024-09-03 ENCOUNTER — Ambulatory Visit: Payer: MEDICAID

## 2024-09-03 DIAGNOSIS — M25612 Stiffness of left shoulder, not elsewhere classified: Secondary | ICD-10-CM

## 2024-09-03 DIAGNOSIS — I890 Lymphedema, not elsewhere classified: Secondary | ICD-10-CM

## 2024-09-03 DIAGNOSIS — N644 Mastodynia: Secondary | ICD-10-CM

## 2024-09-03 DIAGNOSIS — D0512 Intraductal carcinoma in situ of left breast: Secondary | ICD-10-CM | POA: Diagnosis not present

## 2024-09-03 NOTE — Therapy (Signed)
 OUTPATIENT PHYSICAL THERAPY  UPPER EXTREMITY ONCOLOGY Patient Name: [name] MRN: [id number] DOB:Feb 05, 1971, 53 y.o., female Today's Date: 09/03/2024  END OF SESSION:  PT End of Session - 09/03/24 1206     Visit Number 8    Number of Visits 10    Date for Recertification  09/17/24    Authorization Type 7 visits 07/09/24-08/21/24; 4 more visits 08/21/24-09/25/24    Authorization - Visit Number 8    Authorization - Number of Visits 11    PT Start Time 1206    PT Stop Time 1304    PT Time Calculation (min) 58 min    Activity Tolerance Patient tolerated treatment well    Behavior During Therapy WFL for tasks assessed/performed              Past Medical History:  Diagnosis Date   Allergy    pollen, pcn   Anxiety    Arthritis    Asthma    Bipolar disorder (HCC)    Breast cancer (HCC)    Cataract    Depression    Fibroids    Glaucoma    History of ovarian cyst    Hypertension    Menometrorrhagia    Ovarian cancer (HCC)    Personal history of radiation therapy    Sickle cell trait    Past Surgical History:  Procedure Laterality Date   BREAST BIOPSY Left 12/05/2023   MM LT BREAST BX W LOC DEV 1ST LESION IMAGE BX SPEC STEREO GUIDE 12/05/2023 [facility]   BREAST BIOPSY  01/08/2024   MM LT RADIOACTIVE SEED LOC MAMMO GUIDE 01/08/2024 [facility]   BREAST LUMPECTOMY Left 01/09/2024   BREAST LUMPECTOMY WITH RADIOACTIVE SEED LOCALIZATION Left 01/09/2024   Procedure: BREAST LUMPECTOMY WITH RADIOACTIVE SEED LOCALIZATION;  Surgeon: Aron Shoulders, MD;  Location: [facility];  Service: General;  Laterality: Left;  LEFT BREAST SEED LOCALIZED LUMPECTOMY   CATARACT EXTRACTION, BILATERAL     CESAREAN SECTION     all three pregnancies   COLONOSCOPY  07/04/2021   ROBOTIC ASSISTED BILATERAL SALPINGO OOPHERECTOMY Bilateral 02/04/2024   Procedure: ROBOTIC ASSISTED BILATERAL SALPINGO-OOPHORECTOMY, MINI-LAPAROTOMY FOR SPECIMEN RETRIEVAL;  Surgeon: Eldonna Mays, MD;   Location: [facility];  Service: Gynecology;  Laterality: Bilateral;  POSSBILE STAGING; POSSIBLE LAPAROTOMY   Patient Active Problem List   Diagnosis Date Noted   Genetic testing 01/27/2024   Adnexal mass 01/07/2024   Ductal carcinoma in situ (DCIS) of left breast 12/24/2023   Tobacco abuse 09/15/2018   Visual disturbance 04/24/2018   Obesity 12/12/2017   Insomnia 03/13/2017   Anxiety associated with depression 01/09/2017   Tooth ache 05/24/2016   Spinal stenosis, lumbar region, with neurogenic claudication 05/15/2016   Cervical disc disorder with radiculopathy of cervical region 05/15/2016   Vaginal atrophy 04/30/2016   Asthma 02/20/2016   Depression 02/20/2016   Myopathy 02/20/2016   Back pain 02/20/2016   Neuropathy 02/20/2016   Fibroids    History of ovarian cyst    Menometrorrhagia 04/09/2011    PCP: Corrina Sabin, MD  REFERRING PROVIDER: Morna Kendall, NP  REFERRING DIAG:  Diagnosis  N63.0 (ICD-10-CM) - Breast swelling   THERAPY DIAG:  Ductal carcinoma in situ (DCIS) of left breast  Breast pain  Stiffness of left shoulder, not elsewhere classified  Lymphedema, not elsewhere classified  ONSET DATE: 11/08/23  Rationale for Evaluation and Treatment: Rehabilitation  SUBJECTIVE:  SUBJECTIVE STATEMENT:  The inside of my breast feels better. Today the top of my breast is what feels hard.   PERTINENT HISTORY: DCIS of left breast, completed radiation 03/18/24, other hx includes scoliosis which includes pain.    PAIN:  Are you having pain? Yes NPRS scale: 3/10 Pain location: left breast Pain orientation: Left  PAIN TYPE: superior breast Pain description: constant  Aggravating factors: increased breast lymphedema  Relieving factors: MLD helps   PRECAUTIONS: None  RED  FLAGS: None   WEIGHT BEARING RESTRICTIONS: No  FALLS:  Has patient fallen in last 6 months? No  LIVING ENVIRONMENT: Lives with: lives with their family and lives with their daughter Has following equipment at home: Single point cane and Environmental Consultant - 2 wheeled  OCCUPATION: not working   LEISURE: nothing   HAND DOMINANCE: right   PRIOR LEVEL OF FUNCTION: Needs assistance with ADLs a nurse comes 3.5 hours per day.    PATIENT GOALS: decrease the breast pain.     OBJECTIVE: Note: Objective measures were completed at Evaluation unless otherwise noted.  COGNITION: Overall cognitive status: Within functional limits for tasks assessed   PALPATION:  +2 TTP and tightness with Lt rhomboids, lats, UT, supraclavicular fossa, inferior and medial breast with fibrosis mostly noted the inferior medial corner of the breast.    OBSERVATIONS / OTHER ASSESSMENTS: enlarged pores left medial inferior breast   SENSATION: reports occasional numbness in the Lt arm   POSTURE: holds Lt arm guarded   UPPER EXTREMITY AROM/PROM:  A/PROM RIGHT   eval   Shoulder extension 60  Shoulder flexion 165  Shoulder abduction 170  Shoulder internal rotation 70  Shoulder external rotation 86    (Blank rows = not tested)  A/PROM LEFT   eval LEFT 08/20/24  Shoulder extension 36 painful 53 pain in breast  Shoulder flexion AROM: 100 pain in axilla PROM: 65 pain in breast 152 pain in axilla  Shoulder abduction AROM:98 pain in axilla PROM: 70 pain in breast  155 pain in arm & breast  Shoulder internal rotation    Shoulder external rotation PROM: 45 pain in breast     (Blank rows = not tested)   CERVICAL AROM: All within normal limits: WFL but pain with Lt rotation  UPPER EXTREMITY STRENGTH:  Eval: BREAST COMPLAINTS QUESTIONNAIRE    Pain: 7 Heaviness: 7 Swollen feeling: 7 Tense Skin: 7 Redness: 0 Bra Print: 7 Size of Pores: 7 Hard feeling: 7 Total: 49/80 A Score over 9 indicates lymphedema issues  in the breast  08/20/24: BREAST COMPLAINTS QUESTIONNAIRE    Pain: 3 Heaviness: 3 Swollen feeling: 3 Tense Skin: 3 Redness: 3 Bra Print: 3 Size of Pores: 3 Hard feeling: 3 Total: 24/80                                                                                                                           TREATMENT DATE:  Pt permission and consent throughout each step of  examination and treatment with modification and draping if requested when working on sensitive areas.  Pt is aware that a clinic chaperone is not available.  09/03/24: Manual Therapy  Supine: short neck, superficial and deep abdominals, left inguinal nodes, left axillo-inguinal anastomosis, Rt axillary and pectoral nodes, intact anterior thorax sequence, anterior interaxillary anastomosis and then left superior and medial breast redirecting towards anterior anastomosis spending extra time over area of fibrosis (medial breat was improved from last session, fibrosis still present but less so), then into Rt S/L for focus to lateral and inferior breast redirecting towards lateral anastomosis, then finished retracing steps in supine. P/ROM to Lt shoulder into flex, abd and D2 with scapular depression throughout by therapist STM to scar tissue in Lt axilla during P/ROM when at end range stretches  08/27/24: Manual Therapy  Supine: short neck, superficial and deep abdominals, left inguinal nodes, left axillo-inguinal anastomosis, Rt axillary and pectoral nodes, intact upper quadrant sequence, anterior interaxillary anastomosis and then left superior and medial breast redirecting towards anterior anastomosis spending extra time over area of fibrosis,then into Rt S/L for focus to lateral and inferior breast redirecting towards lateral anastomosis, then finished retracing steps in supine and reviewed correct skin stretch and pressure with pt. She reports has been doing her self MLD with cocoa butter, advised her to wait to use cocoa  butter after MLD so she can perform more effective skin stretch sans sliding.   08/20/24 Manual Therapy  Supine: clavicular and sternal nodes, bil axillary nodes, left inguinal nodes, anterior interaxillary pathway, left axillo-inguinal pathway, and then left breast towards the nearest nodes/pathway, and then repeating all steps in reverse. Then posterior interaxillary work and work on lateral breast in sidelying and then one more time repeating steps in supine - also included MLD for proximal UE STM to pectoralis Re-assessed objective measurements & updated goals       PATIENT EDUCATION:  Education details: per today's note Person educated: Patient and Child(ren) Education method: Programmer, Multimedia, Demonstration, Verbal cues, and Handouts Education comprehension: verbalized understanding and needs further education  HOME EXERCISE PROGRAM: Post-op exercises  ASSESSMENT:  CLINICAL IMPRESSION: Continued with MLD to Lt breast focusing on areas of fibrosis. Her medial breast was improved from last session with area of fibrosis being smaller. Also peau d'orange has decreased in appearance and skin elasticity improving indicating less fluid beneath skin.   OBJECTIVE IMPAIRMENTS: decreased activity tolerance, decreased knowledge of condition, decreased ROM, decreased strength, increased edema, increased fascial restrictions, and pain.   ACTIVITY LIMITATIONS: carrying, lifting, and sleeping  PARTICIPATION LIMITATIONS: meal prep, cleaning, laundry, driving, shopping, and community activity  PERSONAL FACTORS: Fitness , radiation hx, are also affecting patient's functional outcome.   REHAB POTENTIAL: Excellent  CLINICAL DECISION MAKING: Stable/uncomplicated  EVALUATION COMPLEXITY: Low  GOALS: Goals reviewed with patient? Yes  SHORT TERM GOALS: Target date: 07/09/24  Pt will be educated on breast lymphedema and lymphatic physiology  Baseline: Goal status: MET  2.  Pt will be educated  on initial HEP  Baseline:  Goal status: MET   LONG TERM GOALS: Target date: 09/17/24  Pt will improve Left shoulder AROM to at least 160deg of flexion and abduction to improve mobility  Baseline:  Goal status: Ongoing   2.  Pt will be ind with self MLD of the breast Baseline:  Goal status: MET  3.  Pt will decrease breast edema questionnaire to 25/80 or less  Baseline:  Goal status: MET  4.  Pt will have pain free  Lt shoulder AROM Baseline:  Goal status: Ongoing   5.  Pt will obtain a swell spot to decrease breast fibrosis  Baseline:  Goal status: Ongoing   PLAN:  PT FREQUENCY: 1x/week    PT DURATION: 4 weeks  PLANNED INTERVENTIONS: 97164- PT Re-evaluation, 97110-Therapeutic exercises, 97530- Therapeutic activity, 97112- Neuromuscular re-education, 97535- Self Care, 02859- Manual therapy, Patient/Family education, Balance training, Joint mobilization, Therapeutic exercises, Therapeutic activity, Neuromuscular re-education, Gait training, and Self Care, dry needling   PLAN FOR NEXT SESSION: Re measure shoulder A/ROM for goal assess. continue MLD Lt breast & medial upper arm PRN, STM for pain relief, PROM/AAROM

## 2024-09-07 ENCOUNTER — Other Ambulatory Visit: Payer: Self-pay | Admitting: Family Medicine

## 2024-09-07 DIAGNOSIS — J4520 Mild intermittent asthma, uncomplicated: Secondary | ICD-10-CM

## 2024-09-18 ENCOUNTER — Ambulatory Visit: Payer: MEDICAID | Attending: Adult Health | Admitting: Rehabilitation

## 2024-09-18 ENCOUNTER — Encounter: Payer: Self-pay | Admitting: Rehabilitation

## 2024-09-18 DIAGNOSIS — N644 Mastodynia: Secondary | ICD-10-CM | POA: Diagnosis present

## 2024-09-18 DIAGNOSIS — M25612 Stiffness of left shoulder, not elsewhere classified: Secondary | ICD-10-CM | POA: Diagnosis present

## 2024-09-18 DIAGNOSIS — I890 Lymphedema, not elsewhere classified: Secondary | ICD-10-CM | POA: Insufficient documentation

## 2024-09-18 DIAGNOSIS — D0512 Intraductal carcinoma in situ of left breast: Secondary | ICD-10-CM | POA: Insufficient documentation

## 2024-09-18 NOTE — Therapy (Signed)
 " OUTPATIENT PHYSICAL THERAPY  UPPER EXTREMITY ONCOLOGY Patient Name: [name] MRN: [id number] DOB:07-Mar-1971, 54 y.o., female Today's Date: 09/18/2024  END OF SESSION:  PT End of Session - 09/18/24 1105     Visit Number 9    Number of Visits 11    Date for Recertification  09/25/24    Authorization Type 7 visits 07/09/24-08/21/24; 4 more visits 08/21/24-09/25/24    Authorization - Visit Number 9    Authorization - Number of Visits 11    PT Start Time 1108    PT Stop Time 1157    PT Time Calculation (min) 49 min    Activity Tolerance Patient tolerated treatment well    Behavior During Therapy WFL for tasks assessed/performed              Past Medical History:  Diagnosis Date   Allergy    pollen, pcn   Anxiety    Arthritis    Asthma    Bipolar disorder (HCC)    Breast cancer (HCC)    Cataract    Depression    Fibroids    Glaucoma    History of ovarian cyst    Hypertension    Menometrorrhagia    Ovarian cancer (HCC)    Personal history of radiation therapy    Sickle cell trait    Past Surgical History:  Procedure Laterality Date   BREAST BIOPSY Left 12/05/2023   MM LT BREAST BX W LOC DEV 1ST LESION IMAGE BX SPEC STEREO GUIDE 12/05/2023 [facility]   BREAST BIOPSY  01/08/2024   MM LT RADIOACTIVE SEED LOC MAMMO GUIDE 01/08/2024 [facility]   BREAST LUMPECTOMY Left 01/09/2024   BREAST LUMPECTOMY WITH RADIOACTIVE SEED LOCALIZATION Left 01/09/2024   Procedure: BREAST LUMPECTOMY WITH RADIOACTIVE SEED LOCALIZATION;  Surgeon: Aron Shoulders, MD;  Location: [facility];  Service: General;  Laterality: Left;  LEFT BREAST SEED LOCALIZED LUMPECTOMY   CATARACT EXTRACTION, BILATERAL     CESAREAN SECTION     all three pregnancies   COLONOSCOPY  07/04/2021   ROBOTIC ASSISTED BILATERAL SALPINGO OOPHERECTOMY Bilateral 02/04/2024   Procedure: ROBOTIC ASSISTED BILATERAL SALPINGO-OOPHORECTOMY, MINI-LAPAROTOMY FOR SPECIMEN RETRIEVAL;  Surgeon: Eldonna Mays, MD;   Location: [facility];  Service: Gynecology;  Laterality: Bilateral;  POSSBILE STAGING; POSSIBLE LAPAROTOMY   Patient Active Problem List   Diagnosis Date Noted   Genetic testing 01/27/2024   Adnexal mass 01/07/2024   Ductal carcinoma in situ (DCIS) of left breast 12/24/2023   Tobacco abuse 09/15/2018   Visual disturbance 04/24/2018   Obesity 12/12/2017   Insomnia 03/13/2017   Anxiety associated with depression 01/09/2017   Tooth ache 05/24/2016   Spinal stenosis, lumbar region, with neurogenic claudication 05/15/2016   Cervical disc disorder with radiculopathy of cervical region 05/15/2016   Vaginal atrophy 04/30/2016   Asthma 02/20/2016   Depression 02/20/2016   Myopathy 02/20/2016   Back pain 02/20/2016   Neuropathy 02/20/2016   Fibroids    History of ovarian cyst    Menometrorrhagia 04/09/2011    PCP: Corrina Sabin, MD  REFERRING PROVIDER: Morna Kendall, NP  REFERRING DIAG:  Diagnosis  N63.0 (ICD-10-CM) - Breast swelling   THERAPY DIAG:  Ductal carcinoma in situ (DCIS) of left breast  Breast pain  Stiffness of left shoulder, not elsewhere classified  Lymphedema, not elsewhere classified  ONSET DATE: 11/08/23  Rationale for Evaluation and Treatment: Rehabilitation  SUBJECTIVE:  SUBJECTIVE STATEMENT:  I am having some pain in my back.    PERTINENT HISTORY: DCIS of left breast, completed radiation 03/18/24, other hx includes scoliosis which includes pain.    PAIN:  Are you having pain? Yes NPRS scale: 3/10 Pain location: left breast Pain orientation: Left  PAIN TYPE: superior breast Pain description: constant  Aggravating factors: increased breast lymphedema  Relieving factors: MLD helps   PRECAUTIONS: None  RED FLAGS: None   WEIGHT BEARING RESTRICTIONS: No  FALLS:   Has patient fallen in last 6 months? No  LIVING ENVIRONMENT: Lives with: lives with their family and lives with their daughter Has following equipment at home: Single point cane and Environmental Consultant - 2 wheeled  OCCUPATION: not working   LEISURE: nothing   HAND DOMINANCE: right   PRIOR LEVEL OF FUNCTION: Needs assistance with ADLs a nurse comes 3.5 hours per day.    PATIENT GOALS: decrease the breast pain.     OBJECTIVE: Note: Objective measures were completed at Evaluation unless otherwise noted.  COGNITION: Overall cognitive status: Within functional limits for tasks assessed   PALPATION:  +2 TTP and tightness with Lt rhomboids, lats, UT, supraclavicular fossa, inferior and medial breast with fibrosis mostly noted the inferior medial corner of the breast.    OBSERVATIONS / OTHER ASSESSMENTS: enlarged pores left medial inferior breast   SENSATION: reports occasional numbness in the Lt arm   POSTURE: holds Lt arm guarded   UPPER EXTREMITY AROM/PROM:  A/PROM RIGHT   eval   Shoulder extension 60  Shoulder flexion 165  Shoulder abduction 170  Shoulder internal rotation 70  Shoulder external rotation 86    (Blank rows = not tested)  A/PROM LEFT   eval LEFT 08/20/24 09/18/24  Shoulder extension 36 painful 53 pain in breast 65  Shoulder flexion AROM: 100 pain in axilla PROM: 65 pain in breast 152 pain in axilla 162  Shoulder abduction AROM:98 pain in axilla PROM: 70 pain in breast  155 pain in arm & breast 168  Shoulder internal rotation     Shoulder external rotation PROM: 45 pain in breast  91 pn    (Blank rows = not tested)   CERVICAL AROM: All within normal limits: WFL but pain with Lt rotation  UPPER EXTREMITY STRENGTH:  Eval: BREAST COMPLAINTS QUESTIONNAIRE    Pain: 7 Heaviness: 7 Swollen feeling: 7 Tense Skin: 7 Redness: 0 Bra Print: 7 Size of Pores: 7 Hard feeling: 7 Total: 49/80 A Score over 9 indicates lymphedema issues in the breast  BREAST  COMPLAINTS QUESTIONNAIRE     08/20/24   09/18/24  Pain:    3   3 Heaviness:   3   0 Swollen feeling:  3   3 Tense Skin:   3   0 Redness:   3   0 Bra Print:   3   3 Size of Pores:   3   3 Hard feeling:   3   1 Total:    24/80   13/80  TREATMENT DATE:  Pt permission and consent throughout each step of examination and treatment with modification and draping if requested when working on sensitive areas.  Pt is aware that a clinic chaperone is not available.   09/18/24 Manual Therapy  Supine: short neck, superficial and deep abdominals, left inguinal nodes, left axillo-inguinal anastomosis, Rt axillary and pectoral nodes, intact anterior thorax sequence, anterior interaxillary anastomosis and then left superior and medial breast redirecting towards anterior anastomosis spending extra time over area of fibrosis (medial breat was improved from last session, fibrosis still present but less so) P/ROM to Lt shoulder into flex, abd and D2 with scapular depression throughout by therapist STM to scar tissue in Lt axilla during P/ROM when at end range stretches  09/03/24: Manual Therapy  Supine: short neck, superficial and deep abdominals, left inguinal nodes, left axillo-inguinal anastomosis, Rt axillary and pectoral nodes, intact anterior thorax sequence, anterior interaxillary anastomosis and then left superior and medial breast redirecting towards anterior anastomosis spending extra time over area of fibrosis (medial breat was improved from last session, fibrosis still present but less so), then into Rt S/L for focus to lateral and inferior breast redirecting towards lateral anastomosis, then finished retracing steps in supine. P/ROM to Lt shoulder into flex, abd and D2 with scapular depression throughout by therapist STM to scar tissue in Lt axilla during P/ROM when at end range  stretches  08/27/24: Manual Therapy  Supine: short neck, superficial and deep abdominals, left inguinal nodes, left axillo-inguinal anastomosis, Rt axillary and pectoral nodes, intact upper quadrant sequence, anterior interaxillary anastomosis and then left superior and medial breast redirecting towards anterior anastomosis spending extra time over area of fibrosis,then into Rt S/L for focus to lateral and inferior breast redirecting towards lateral anastomosis, then finished retracing steps in supine and reviewed correct skin stretch and pressure with pt. She reports has been doing her self MLD with cocoa butter, advised her to wait to use cocoa butter after MLD so she can perform more effective skin stretch sans sliding.    PATIENT EDUCATION:  Education details: per today's note Person educated: Patient and Child(ren) Education method: Programmer, Multimedia, Demonstration, Verbal cues, and Handouts Education comprehension: verbalized understanding and needs further education  HOME EXERCISE PROGRAM: Post-op exercises  ASSESSMENT:  CLINICAL IMPRESSION: Continued with MLD to Lt breast focusing on areas of fibrosis. Score continues to improve with breast complaint score and ROM is mostly equal to the op  OBJECTIVE IMPAIRMENTS: decreased activity tolerance, decreased knowledge of condition, decreased ROM, decreased strength, increased edema, increased fascial restrictions, and pain.   ACTIVITY LIMITATIONS: carrying, lifting, and sleeping  PARTICIPATION LIMITATIONS: meal prep, cleaning, laundry, driving, shopping, and community activity  PERSONAL FACTORS: Fitness , radiation hx, are also affecting patient's functional outcome.   REHAB POTENTIAL: Excellent  CLINICAL DECISION MAKING: Stable/uncomplicated  EVALUATION COMPLEXITY: Low  GOALS: Goals reviewed with patient? Yes  SHORT TERM GOALS: Target date: 07/09/24  Pt will be educated on breast lymphedema and lymphatic physiology   Baseline: Goal status: MET  2.  Pt will be educated on initial HEP  Baseline:  Goal status: MET   LONG TERM GOALS: Target date: 09/17/24  Pt will improve Left shoulder AROM to at least 160deg of flexion and abduction to improve mobility  Baseline:  Goal status: MET 09/18/24  2.  Pt will be ind with self MLD of the breast Baseline:  Goal status: MET  3.  Pt will decrease breast edema questionnaire to 25/80 or less  Baseline:  Goal  status: MET  4.  Pt will have pain free Lt shoulder AROM Baseline:  Goal status: ALMOST MET  5.  Pt will obtain a swell spot to decrease breast fibrosis  Baseline:  Goal status: Ongoing   PLAN:  PT FREQUENCY: 1x/week    PT DURATION: 4 weeks  PLANNED INTERVENTIONS: 97164- PT Re-evaluation, 97110-Therapeutic exercises, 97530- Therapeutic activity, 97112- Neuromuscular re-education, 97535- Self Care, [id number]- Manual therapy, Patient/Family education, Balance training, Joint mobilization, Therapeutic exercises, Therapeutic activity, Neuromuscular re-education, Gait training, and Self Care, dry needling   PLAN FOR NEXT SESSION: Re measure shoulder A/ROM for goal assess. continue MLD Lt breast & medial upper arm PRN, STM for pain relief, PROM/AAROM     "

## 2024-09-25 ENCOUNTER — Encounter: Payer: Self-pay | Admitting: Rehabilitation

## 2024-09-25 ENCOUNTER — Other Ambulatory Visit: Payer: Self-pay | Admitting: Family Medicine

## 2024-09-25 ENCOUNTER — Ambulatory Visit: Payer: MEDICAID | Admitting: Rehabilitation

## 2024-09-25 DIAGNOSIS — N644 Mastodynia: Secondary | ICD-10-CM

## 2024-09-25 DIAGNOSIS — D0512 Intraductal carcinoma in situ of left breast: Secondary | ICD-10-CM | POA: Diagnosis not present

## 2024-09-25 DIAGNOSIS — M25612 Stiffness of left shoulder, not elsewhere classified: Secondary | ICD-10-CM

## 2024-09-25 DIAGNOSIS — I890 Lymphedema, not elsewhere classified: Secondary | ICD-10-CM

## 2024-09-25 NOTE — Therapy (Signed)
 " OUTPATIENT PHYSICAL THERAPY  UPPER EXTREMITY ONCOLOGY Patient Name: [name] MRN: [id number] DOB:[birth year], 54 y.o., female Today's Date: 09/25/2024  END OF SESSION:  PT End of Session - 09/25/24 1152     Visit Number 10    Number of Visits 14    Date for Recertification  10/30/24    Authorization Type 7 visits 07/09/24-08/21/24; 4 more visits 08/21/24-09/25/24    Authorization - Visit Number 10    Authorization - Number of Visits 11    PT Start Time 1100    PT Stop Time 1150    PT Time Calculation (min) 50 min    Activity Tolerance Patient tolerated treatment well    Behavior During Therapy WFL for tasks assessed/performed               Past Medical History:  Diagnosis Date   Allergy    pollen, pcn   Anxiety    Arthritis    Asthma    Bipolar disorder (HCC)    Breast cancer (HCC)    Cataract    Depression    Fibroids    Glaucoma    History of ovarian cyst    Hypertension    Menometrorrhagia    Ovarian cancer (HCC)    Personal history of radiation therapy    Sickle cell trait    Past Surgical History:  Procedure Laterality Date   BREAST BIOPSY Left 12/05/2023   MM LT BREAST BX W LOC DEV 1ST LESION IMAGE BX SPEC STEREO GUIDE 12/05/2023 [facility]   BREAST BIOPSY  01/08/2024   MM LT RADIOACTIVE SEED LOC MAMMO GUIDE 01/08/2024 [facility]   BREAST LUMPECTOMY Left 01/09/2024   BREAST LUMPECTOMY WITH RADIOACTIVE SEED LOCALIZATION Left 01/09/2024   Procedure: BREAST LUMPECTOMY WITH RADIOACTIVE SEED LOCALIZATION;  Surgeon: Aron Shoulders, MD;  Location: [facility];  Service: General;  Laterality: Left;  LEFT BREAST SEED LOCALIZED LUMPECTOMY   CATARACT EXTRACTION, BILATERAL     CESAREAN SECTION     all three pregnancies   COLONOSCOPY  07/04/2021   ROBOTIC ASSISTED BILATERAL SALPINGO OOPHERECTOMY Bilateral 02/04/2024   Procedure: ROBOTIC ASSISTED BILATERAL SALPINGO-OOPHORECTOMY, MINI-LAPAROTOMY FOR SPECIMEN RETRIEVAL;  Surgeon: Eldonna Mays, MD;   Location: [facility];  Service: Gynecology;  Laterality: Bilateral;  POSSBILE STAGING; POSSIBLE LAPAROTOMY   Patient Active Problem List   Diagnosis Date Noted   Genetic testing 01/27/2024   Adnexal mass 01/07/2024   Ductal carcinoma in situ (DCIS) of left breast 12/24/2023   Tobacco abuse 09/15/2018   Visual disturbance 04/24/2018   Obesity 12/12/2017   Insomnia 03/13/2017   Anxiety associated with depression 01/09/2017   Tooth ache 05/24/2016   Spinal stenosis, lumbar region, with neurogenic claudication 05/15/2016   Cervical disc disorder with radiculopathy of cervical region 05/15/2016   Vaginal atrophy 04/30/2016   Asthma 02/20/2016   Depression 02/20/2016   Myopathy 02/20/2016   Back pain 02/20/2016   Neuropathy 02/20/2016   Fibroids    History of ovarian cyst    Menometrorrhagia 04/09/2011    PCP: Corrina Sabin, MD  REFERRING PROVIDER: Morna Kendall, NP  REFERRING DIAG:  Diagnosis  N63.0 (ICD-10-CM) - Breast swelling   THERAPY DIAG:  Ductal carcinoma in situ (DCIS) of left breast  Breast pain  Stiffness of left shoulder, not elsewhere classified  Lymphedema, not elsewhere classified  ONSET DATE: 11/08/23  Rationale for Evaluation and Treatment: Rehabilitation  SUBJECTIVE:  SUBJECTIVE STATEMENT:  It hurting a little more  PERTINENT HISTORY: DCIS of left breast, completed radiation 03/18/24, other hx includes scoliosis which includes pain.    PAIN:  Are you having pain? Yes NPRS scale: 5/10 Pain location: left breast Pain orientation: Left  PAIN TYPE: superior breast Pain description: constant  Aggravating factors: increased breast lymphedema  Relieving factors: MLD helps   PRECAUTIONS: None  RED FLAGS: None   WEIGHT BEARING RESTRICTIONS: No  FALLS:  Has patient  fallen in last 6 months? No  LIVING ENVIRONMENT: Lives with: lives with their family and lives with their daughter Has following equipment at home: Single point cane and Environmental Consultant - 2 wheeled  OCCUPATION: not working   LEISURE: nothing   HAND DOMINANCE: right   PRIOR LEVEL OF FUNCTION: Needs assistance with ADLs a nurse comes 3.5 hours per day.    PATIENT GOALS: decrease the breast pain.     OBJECTIVE: Note: Objective measures were completed at Evaluation unless otherwise noted.  COGNITION: Overall cognitive status: Within functional limits for tasks assessed   PALPATION:  +2 TTP and tightness with Lt rhomboids, lats, UT, supraclavicular fossa, inferior and medial breast with fibrosis mostly noted the inferior medial corner of the breast.    OBSERVATIONS / OTHER ASSESSMENTS: enlarged pores left medial inferior breast   SENSATION: reports occasional numbness in the Lt arm   POSTURE: holds Lt arm guarded   UPPER EXTREMITY AROM/PROM:  A/PROM RIGHT   eval   Shoulder extension 60  Shoulder flexion 165  Shoulder abduction 170  Shoulder internal rotation 70  Shoulder external rotation 86    (Blank rows = not tested)  A/PROM LEFT   eval LEFT 08/20/24 09/18/24  Shoulder extension 36 painful 53 pain in breast 65  Shoulder flexion AROM: 100 pain in axilla PROM: 65 pain in breast 152 pain in axilla 162  Shoulder abduction AROM:98 pain in axilla PROM: 70 pain in breast  155 pain in arm & breast 168  Shoulder internal rotation     Shoulder external rotation PROM: 45 pain in breast  91 pn    (Blank rows = not tested)   CERVICAL AROM: All within normal limits: WFL but pain with Lt rotation  UPPER EXTREMITY STRENGTH:  Eval: BREAST COMPLAINTS QUESTIONNAIRE    Pain: 7 Heaviness: 7 Swollen feeling: 7 Tense Skin: 7 Redness: 0 Bra Print: 7 Size of Pores: 7 Hard feeling: 7 Total: 49/80 A Score over 9 indicates lymphedema issues in the breast  BREAST COMPLAINTS  QUESTIONNAIRE     08/20/24   09/18/24  Pain:    3   3 Heaviness:   3   0 Swollen feeling:  3   3 Tense Skin:   3   0 Redness:   3   0 Bra Print:   3   3 Size of Pores:   3   3 Hard feeling:   3   1 Total:    24/80   13/80  TREATMENT DATE:  Pt permission and consent throughout each step of examination and treatment with modification and draping if requested when working on sensitive areas.  Pt is aware that a clinic chaperone is not available.   09/25/24 Manual Therapy  Supine: short neck, superficial and deep abdominals, left inguinal nodes, left axillo-inguinal anastomosis, Rt axillary and pectoral nodes, intact anterior thorax sequence, anterior interaxillary anastomosis and then left superior and medial breast redirecting towards anterior anastomosis spending extra time over area of fibrosis (medial breat was improved from last session, fibrosis still present but less so) P/ROM to Lt shoulder into flex, abd and D2 with scapular depression throughout by therapist STM to scar tissue in Lt axilla during P/ROM when at end range stretches  09/18/24 Manual Therapy  Supine: short neck, superficial and deep abdominals, left inguinal nodes, left axillo-inguinal anastomosis, Rt axillary and pectoral nodes, intact anterior thorax sequence, anterior interaxillary anastomosis and then left superior and medial breast redirecting towards anterior anastomosis spending extra time over area of fibrosis (medial breat was improved from last session, fibrosis still present but less so) P/ROM to Lt shoulder into flex, abd and D2 with scapular depression throughout by therapist STM to scar tissue in Lt axilla during P/ROM when at end range stretches  09/03/24: Manual Therapy  Supine: short neck, superficial and deep abdominals, left inguinal nodes, left axillo-inguinal anastomosis, Rt axillary  and pectoral nodes, intact anterior thorax sequence, anterior interaxillary anastomosis and then left superior and medial breast redirecting towards anterior anastomosis spending extra time over area of fibrosis (medial breat was improved from last session, fibrosis still present but less so), then into Rt S/L for focus to lateral and inferior breast redirecting towards lateral anastomosis, then finished retracing steps in supine. P/ROM to Lt shoulder into flex, abd and D2 with scapular depression throughout by therapist STM to scar tissue in Lt axilla during P/ROM when at end range stretches  08/27/24: Manual Therapy  Supine: short neck, superficial and deep abdominals, left inguinal nodes, left axillo-inguinal anastomosis, Rt axillary and pectoral nodes, intact upper quadrant sequence, anterior interaxillary anastomosis and then left superior and medial breast redirecting towards anterior anastomosis spending extra time over area of fibrosis,then into Rt S/L for focus to lateral and inferior breast redirecting towards lateral anastomosis, then finished retracing steps in supine and reviewed correct skin stretch and pressure with pt. She reports has been doing her self MLD with cocoa butter, advised her to wait to use cocoa butter after MLD so she can perform more effective skin stretch sans sliding.    PATIENT EDUCATION:  Education details: per today's note Person educated: Patient and Child(ren) Education method: Programmer, Multimedia, Demonstration, Verbal cues, and Handouts Education comprehension: verbalized understanding and needs further education  HOME EXERCISE PROGRAM: Post-op exercises  ASSESSMENT:  CLINICAL IMPRESSION: Continued with MLD to Lt breast focusing on areas of fibrosis. Score continues to improve with breast complaint score and ROM is mostly equal to the opposite side and without much pain.  Will extend POC x another 4 weeks   OBJECTIVE IMPAIRMENTS: decreased activity tolerance,  decreased knowledge of condition, decreased ROM, decreased strength, increased edema, increased fascial restrictions, and pain.   ACTIVITY LIMITATIONS: carrying, lifting, and sleeping  PARTICIPATION LIMITATIONS: meal prep, cleaning, laundry, driving, shopping, and community activity  PERSONAL FACTORS: Fitness , radiation hx, are also affecting patient's functional outcome.   REHAB POTENTIAL: Excellent  CLINICAL DECISION MAKING: Stable/uncomplicated  EVALUATION COMPLEXITY: Low  GOALS: Goals reviewed with patient? Yes  SHORT TERM  GOALS: Target date: 07/09/24  Pt will be educated on breast lymphedema and lymphatic physiology  Baseline: Goal status: MET  2.  Pt will be educated on initial HEP  Baseline:  Goal status: MET   LONG TERM GOALS: Target date: 11/01/24 Pt will improve Le/ft shoulder AROM to at least 160deg of flexion and abduction to /improve mobility  Baseline:  Goal status: MET 09/18/24  2.  Pt will be ind with self MLD of the breast Baseline:  Goal status: MET  3.  Pt will decrease breast edema questionnaire to 9/80 or less  Baseline:  Goal status: NEW  4.  Pt will have pain free Lt shoulder AROM Baseline:  Goal status: ALMOST MET  5.  Pt will obtain a swell spot to decrease breast fibrosis  Baseline:  Goal status: Ongoing   PLAN:  PT FREQUENCY: 1x/week    PT DURATION: 5 weeks   PLANNED INTERVENTIONS: 97164- PT Re-evaluation, 97110-Therapeutic exercises, 97530- Therapeutic activity, 97112- Neuromuscular re-education, 97535- Self Care, [id number]- Manual therapy, Patient/Family education, Balance training, Joint mobilization, Therapeutic exercises, Therapeutic activity, Neuromuscular re-education, Gait training, and Self Care, dry needling   PLAN FOR NEXT SESSION: continue MLD Lt breast & medial upper arm PRN, STM for pain relief, PROM/AAROM   Saddie Raw, PT 09/25/2024, 11:58 AM     "

## 2024-10-09 ENCOUNTER — Ambulatory Visit: Payer: MEDICAID

## 2024-10-09 DIAGNOSIS — M25612 Stiffness of left shoulder, not elsewhere classified: Secondary | ICD-10-CM

## 2024-10-09 DIAGNOSIS — N644 Mastodynia: Secondary | ICD-10-CM

## 2024-10-09 DIAGNOSIS — D0512 Intraductal carcinoma in situ of left breast: Secondary | ICD-10-CM | POA: Diagnosis not present

## 2024-10-09 DIAGNOSIS — I890 Lymphedema, not elsewhere classified: Secondary | ICD-10-CM

## 2024-10-09 NOTE — Therapy (Signed)
 " OUTPATIENT PHYSICAL THERAPY  UPPER EXTREMITY ONCOLOGY TREATMENT Patient Name: [name] MRN: [id number] DOB:[birth year], 54 y.o., female Today's Date: 10/09/2024  END OF SESSION:  PT End of Session - 10/09/24 1002     Visit Number 11    Number of Visits 14    Date for Recertification  10/30/24    Authorization Type 7 visits 07/09/24-08/21/24; 4 more visits 08/21/24-09/25/24; [employer] Approved (4 VISITS), Auth#: [id number]; Dates: 09/26/24-10/30/24    Authorization - Visit Number 11    Authorization - Number of Visits 16    PT Start Time 1001    PT Stop Time 1101    PT Time Calculation (min) 60 min    Activity Tolerance Patient tolerated treatment well    Behavior During Therapy WFL for tasks assessed/performed               Past Medical History:  Diagnosis Date   Allergy    pollen, pcn   Anxiety    Arthritis    Asthma    Bipolar disorder (HCC)    Breast cancer (HCC)    Cataract    Depression    Fibroids    Glaucoma    History of ovarian cyst    Hypertension    Menometrorrhagia    Ovarian cancer (HCC)    Personal history of radiation therapy    Sickle cell trait    Past Surgical History:  Procedure Laterality Date   BREAST BIOPSY Left 12/05/2023   MM LT BREAST BX W LOC DEV 1ST LESION IMAGE BX SPEC STEREO GUIDE 12/05/2023 [facility]   BREAST BIOPSY  01/08/2024   MM LT RADIOACTIVE SEED LOC MAMMO GUIDE 01/08/2024 [facility]   BREAST LUMPECTOMY Left 01/09/2024   BREAST LUMPECTOMY WITH RADIOACTIVE SEED LOCALIZATION Left 01/09/2024   Procedure: BREAST LUMPECTOMY WITH RADIOACTIVE SEED LOCALIZATION;  Surgeon: Aron Shoulders, MD;  Location: [facility];  Service: General;  Laterality: Left;  LEFT BREAST SEED LOCALIZED LUMPECTOMY   CATARACT EXTRACTION, BILATERAL     CESAREAN SECTION     all three pregnancies   COLONOSCOPY  07/04/2021   ROBOTIC ASSISTED BILATERAL SALPINGO OOPHERECTOMY Bilateral 02/04/2024   Procedure: ROBOTIC ASSISTED BILATERAL  SALPINGO-OOPHORECTOMY, MINI-LAPAROTOMY FOR SPECIMEN RETRIEVAL;  Surgeon: Eldonna Mays, MD;  Location: [facility];  Service: Gynecology;  Laterality: Bilateral;  POSSBILE STAGING; POSSIBLE LAPAROTOMY   Patient Active Problem List   Diagnosis Date Noted   Genetic testing 01/27/2024   Adnexal mass 01/07/2024   Ductal carcinoma in situ (DCIS) of left breast 12/24/2023   Tobacco abuse 09/15/2018   Visual disturbance 04/24/2018   Obesity 12/12/2017   Insomnia 03/13/2017   Anxiety associated with depression 01/09/2017   Tooth ache 05/24/2016   Spinal stenosis, lumbar region, with neurogenic claudication 05/15/2016   Cervical disc disorder with radiculopathy of cervical region 05/15/2016   Vaginal atrophy 04/30/2016   Asthma 02/20/2016   Depression 02/20/2016   Myopathy 02/20/2016   Back pain 02/20/2016   Neuropathy 02/20/2016   Fibroids    History of ovarian cyst    Menometrorrhagia 04/09/2011    PCP: Corrina Sabin, MD  REFERRING PROVIDER: Morna Kendall, NP  REFERRING DIAG:  Diagnosis  N63.0 (ICD-10-CM) - Breast swelling   THERAPY DIAG:  Ductal carcinoma in situ (DCIS) of left breast  Breast pain  Stiffness of left shoulder, not elsewhere classified  Lymphedema, not elsewhere classified  ONSET DATE: 11/08/23  Rationale for Evaluation and Treatment: Rehabilitation  SUBJECTIVE:  SUBJECTIVE STATEMENT:  I bumped my breast on a table a few days ago at a restaurant when I almost fell and tried to catch myself and it hurt real bad. It's feeling a bit better. It was a little red but that's gone away since. The swelling is a little worse there since then too and tender.   PERTINENT HISTORY: DCIS of left breast, completed radiation 03/18/24, other hx includes scoliosis which includes pain.    PAIN:   Are you having pain? Yes NPRS scale: 3/10 Pain location: left breast Pain orientation: Left  PAIN TYPE: lateral breast Pain description: constant  Aggravating factors: increased breast lymphedema by bumping it on a table Relieving factors: MLD helps   PRECAUTIONS: None  RED FLAGS: None   WEIGHT BEARING RESTRICTIONS: No  FALLS:  Has patient fallen in last 6 months? No  LIVING ENVIRONMENT: Lives with: lives with their family and lives with their daughter Has following equipment at home: Single point cane and Environmental Consultant - 2 wheeled  OCCUPATION: not working   LEISURE: nothing   HAND DOMINANCE: right   PRIOR LEVEL OF FUNCTION: Needs assistance with ADLs a nurse comes 3.5 hours per day.    PATIENT GOALS: decrease the breast pain.     OBJECTIVE: Note: Objective measures were completed at Evaluation unless otherwise noted.  COGNITION: Overall cognitive status: Within functional limits for tasks assessed   PALPATION:  +2 TTP and tightness with Lt rhomboids, lats, UT, supraclavicular fossa, inferior and medial breast with fibrosis mostly noted the inferior medial corner of the breast.    OBSERVATIONS / OTHER ASSESSMENTS: enlarged pores left medial inferior breast   SENSATION: reports occasional numbness in the Lt arm   POSTURE: holds Lt arm guarded   UPPER EXTREMITY AROM/PROM:  A/PROM RIGHT   eval   Shoulder extension 60  Shoulder flexion 165  Shoulder abduction 170  Shoulder internal rotation 70  Shoulder external rotation 86    (Blank rows = not tested)  A/PROM LEFT   eval LEFT 08/20/24 09/18/24  Shoulder extension 36 painful 53 pain in breast 65  Shoulder flexion AROM: 100 pain in axilla PROM: 65 pain in breast 152 pain in axilla 162  Shoulder abduction AROM:98 pain in axilla PROM: 70 pain in breast  155 pain in arm & breast 168  Shoulder internal rotation     Shoulder external rotation PROM: 45 pain in breast  91 pn    (Blank rows = not tested)   CERVICAL  AROM: All within normal limits: WFL but pain with Lt rotation  UPPER EXTREMITY STRENGTH:  Eval: BREAST COMPLAINTS QUESTIONNAIRE    Pain: 7 Heaviness: 7 Swollen feeling: 7 Tense Skin: 7 Redness: 0 Bra Print: 7 Size of Pores: 7 Hard feeling: 7 Total: 49/80 A Score over 9 indicates lymphedema issues in the breast  BREAST COMPLAINTS QUESTIONNAIRE     08/20/24   09/18/24  Pain:    3   3 Heaviness:   3   0 Swollen feeling:  3   3 Tense Skin:   3   0 Redness:   3   0 Bra Print:   3   3 Size of Pores:   3   3 Hard feeling:   3   1 Total:    24/80   13/80  TREATMENT DATE:  Pt permission and consent throughout each step of examination and treatment with modification and draping if requested when working on sensitive areas.  Pt is aware that a clinic chaperone is not available.   10/09/24: Manual Therapy  Supine: short neck, superficial and deep abdominals, left inguinal nodes, left axillo-inguinal anastomosis, Rt axillary and pectoral nodes, intact anterior thorax sequence, anterior interaxillary anastomosis and then left superior and medial breast redirecting towards anterior anastomosis spending extra time over area of fibrosis (medial breat was improved from last session, fibrosis still present but less so) P/ROM to Lt shoulder into flex, abd and D2 with scapular depression throughout by therapist STM to scar tissue in Lt axilla during P/ROM when at end range stretches  09/25/24 Manual Therapy  Supine: short neck, superficial and deep abdominals, left inguinal nodes, left axillo-inguinal anastomosis, Rt axillary and pectoral nodes, intact anterior thorax sequence, anterior interaxillary anastomosis and then left superior and medial breast redirecting towards anterior anastomosis spending extra time over area of fibrosis (medial breat was improved from last session,  fibrosis still present but less so) P/ROM to Lt shoulder into flex, abd and D2 with scapular depression throughout by therapist STM to scar tissue in Lt axilla during P/ROM when at end range stretches  09/18/24 Manual Therapy  Supine: short neck, superficial and deep abdominals, left inguinal nodes, left axillo-inguinal anastomosis, Rt axillary and pectoral nodes, intact anterior thorax sequence, anterior interaxillary anastomosis and then left superior and medial breast redirecting towards anterior anastomosis spending extra time over area of fibrosis (medial breat was improved from last session, fibrosis still present but less so) P/ROM to Lt shoulder into flex, abd and D2 with scapular depression throughout by therapist STM to scar tissue in Lt axilla during P/ROM when at end range stretches  09/03/24: Manual Therapy  Supine: short neck, superficial and deep abdominals, left inguinal nodes, left axillo-inguinal anastomosis, Rt axillary and pectoral nodes, intact anterior thorax sequence, anterior interaxillary anastomosis and then left superior and medial breast redirecting towards anterior anastomosis spending extra time over area of fibrosis (medial breat was improved from last session, fibrosis still present but less so), then into Rt S/L for focus to lateral and inferior breast redirecting towards lateral anastomosis, then finished retracing steps in supine. P/ROM to Lt shoulder into flex, abd and D2 with scapular depression throughout by therapist STM to scar tissue in Lt axilla during P/ROM when at end range stretches  08/27/24: Manual Therapy  Supine: short neck, superficial and deep abdominals, left inguinal nodes, left axillo-inguinal anastomosis, Rt axillary and pectoral nodes, intact upper quadrant sequence, anterior interaxillary anastomosis and then left superior and medial breast redirecting towards anterior anastomosis spending extra time over area of fibrosis,then into Rt S/L for  focus to lateral and inferior breast redirecting towards lateral anastomosis, then finished retracing steps in supine and reviewed correct skin stretch and pressure with pt. She reports has been doing her self MLD with cocoa butter, advised her to wait to use cocoa butter after MLD so she can perform more effective skin stretch sans sliding.    PATIENT EDUCATION:  Education details: per today's note Person educated: Patient and Child(ren) Education method: Programmer, Multimedia, Demonstration, Verbal cues, and Handouts Education comprehension: verbalized understanding and needs further education  HOME EXERCISE PROGRAM: Post-op exercises  ASSESSMENT:  CLINICAL IMPRESSION: Continued with MLD to LT breast along with STM at tightness at Lt pectoralis tendon. Pt had some mild increase in lymphatic fluid at lateral breast from falling on  breast on table at a restaurant when she lost her footing getting up. This improved by end of session and more chronic medial fibrosis improved by end of session as well.   OBJECTIVE IMPAIRMENTS: decreased activity tolerance, decreased knowledge of condition, decreased ROM, decreased strength, increased edema, increased fascial restrictions, and pain.   ACTIVITY LIMITATIONS: carrying, lifting, and sleeping  PARTICIPATION LIMITATIONS: meal prep, cleaning, laundry, driving, shopping, and community activity  PERSONAL FACTORS: Fitness , radiation hx, are also affecting patient's functional outcome.   REHAB POTENTIAL: Excellent  CLINICAL DECISION MAKING: Stable/uncomplicated  EVALUATION COMPLEXITY: Low  GOALS: Goals reviewed with patient? Yes  SHORT TERM GOALS: Target date: 07/09/24  Pt will be educated on breast lymphedema and lymphatic physiology  Baseline: Goal status: MET  2.  Pt will be educated on initial HEP  Baseline:  Goal status: MET   LONG TERM GOALS: Target date: 11/01/24 Pt will improve Le/ft shoulder AROM to at least 160deg of flexion and  abduction to /improve mobility  Baseline:  Goal status: MET 09/18/24  2.  Pt will be ind with self MLD of the breast Baseline:  Goal status: MET  3.  Pt will decrease breast edema questionnaire to 9/80 or less  Baseline:  Goal status: NEW  4.  Pt will have pain free Lt shoulder AROM Baseline:  Goal status: ALMOST MET  5.  Pt will obtain a swell spot to decrease breast fibrosis  Baseline:  Goal status: Ongoing   PLAN:  PT FREQUENCY: 1x/week    PT DURATION: 5 weeks   PLANNED INTERVENTIONS: 97164- PT Re-evaluation, 97110-Therapeutic exercises, 97530- Therapeutic activity, 97112- Neuromuscular re-education, 97535- Self Care, [id number]- Manual therapy, Patient/Family education, Balance training, Joint mobilization, Therapeutic exercises, Therapeutic activity, Neuromuscular re-education, Gait training, and Self Care, dry needling   PLAN FOR NEXT SESSION: continue MLD Lt breast & medial upper arm PRN, STM for pain relief, PROM/AAROM   Berwyn Knights, PTA 10/09/24 11:05 AM      "

## 2024-10-16 ENCOUNTER — Ambulatory Visit: Payer: MEDICAID

## 2024-10-16 DIAGNOSIS — N644 Mastodynia: Secondary | ICD-10-CM

## 2024-10-16 DIAGNOSIS — D0512 Intraductal carcinoma in situ of left breast: Secondary | ICD-10-CM

## 2024-10-16 DIAGNOSIS — I890 Lymphedema, not elsewhere classified: Secondary | ICD-10-CM

## 2024-10-16 DIAGNOSIS — M25612 Stiffness of left shoulder, not elsewhere classified: Secondary | ICD-10-CM

## 2024-10-16 NOTE — Therapy (Signed)
 " OUTPATIENT PHYSICAL THERAPY  UPPER EXTREMITY ONCOLOGY TREATMENT Patient Name: [name] MRN: [id number] DOB:April 06, 1971, 54 y.o., female Today's Date: 10/16/2024  END OF SESSION:  PT End of Session - 10/16/24 1003     Visit Number 12    Number of Visits 14    Date for Recertification  10/30/24    Authorization Type 7 visits 07/09/24-08/21/24; 4 more visits 08/21/24-09/25/24; [employer] Approved (4 VISITS), Auth#: [id number]; Dates: 09/26/24-10/30/24    Authorization - Visit Number 12    Authorization - Number of Visits 16    PT Start Time 1000    PT Stop Time 1058    PT Time Calculation (min) 58 min    Activity Tolerance Patient tolerated treatment well    Behavior During Therapy WFL for tasks assessed/performed               Past Medical History:  Diagnosis Date   Allergy    pollen, pcn   Anxiety    Arthritis    Asthma    Bipolar disorder (HCC)    Breast cancer (HCC)    Cataract    Depression    Fibroids    Glaucoma    History of ovarian cyst    Hypertension    Menometrorrhagia    Ovarian cancer (HCC)    Personal history of radiation therapy    Sickle cell trait    Past Surgical History:  Procedure Laterality Date   BREAST BIOPSY Left 12/05/2023   MM LT BREAST BX W LOC DEV 1ST LESION IMAGE BX SPEC STEREO GUIDE 12/05/2023 [facility]   BREAST BIOPSY  01/08/2024   MM LT RADIOACTIVE SEED LOC MAMMO GUIDE 01/08/2024 [facility]   BREAST LUMPECTOMY Left 01/09/2024   BREAST LUMPECTOMY WITH RADIOACTIVE SEED LOCALIZATION Left 01/09/2024   Procedure: BREAST LUMPECTOMY WITH RADIOACTIVE SEED LOCALIZATION;  Surgeon: Aron Shoulders, MD;  Location: [facility];  Service: General;  Laterality: Left;  LEFT BREAST SEED LOCALIZED LUMPECTOMY   CATARACT EXTRACTION, BILATERAL     CESAREAN SECTION     all three pregnancies   COLONOSCOPY  07/04/2021   ROBOTIC ASSISTED BILATERAL SALPINGO OOPHERECTOMY Bilateral 02/04/2024   Procedure: ROBOTIC ASSISTED BILATERAL  SALPINGO-OOPHORECTOMY, MINI-LAPAROTOMY FOR SPECIMEN RETRIEVAL;  Surgeon: Eldonna Mays, MD;  Location: [facility];  Service: Gynecology;  Laterality: Bilateral;  POSSBILE STAGING; POSSIBLE LAPAROTOMY   Patient Active Problem List   Diagnosis Date Noted   Genetic testing 01/27/2024   Adnexal mass 01/07/2024   Ductal carcinoma in situ (DCIS) of left breast 12/24/2023   Tobacco abuse 09/15/2018   Visual disturbance 04/24/2018   Obesity 12/12/2017   Insomnia 03/13/2017   Anxiety associated with depression 01/09/2017   Tooth ache 05/24/2016   Spinal stenosis, lumbar region, with neurogenic claudication 05/15/2016   Cervical disc disorder with radiculopathy of cervical region 05/15/2016   Vaginal atrophy 04/30/2016   Asthma 02/20/2016   Depression 02/20/2016   Myopathy 02/20/2016   Back pain 02/20/2016   Neuropathy 02/20/2016   Fibroids    History of ovarian cyst    Menometrorrhagia 04/09/2011    PCP: Corrina Sabin, MD  REFERRING PROVIDER: Morna Kendall, NP  REFERRING DIAG:  Diagnosis  N63.0 (ICD-10-CM) - Breast swelling   THERAPY DIAG:  Ductal carcinoma in situ (DCIS) of left breast  Breast pain  Stiffness of left shoulder, not elsewhere classified  Lymphedema, not elsewhere classified  ONSET DATE: 11/08/23  Rationale for Evaluation and Treatment: Rehabilitation  SUBJECTIVE:  SUBJECTIVE STATEMENT:  This cold weather is making my neck and back hurt. My breast is hurting a little more today too.   PERTINENT HISTORY: DCIS of left breast, completed radiation 03/18/24, other hx includes scoliosis which includes pain.    PAIN:  Are you having pain? Yes NPRS scale: 5/10 Pain location: left breast Pain orientation: Left  PAIN TYPE: lateral breast Pain description: constant  Aggravating  factors: increased breast lymphedema by bumping it on a table Relieving factors: MLD helps   PRECAUTIONS: None  RED FLAGS: None   WEIGHT BEARING RESTRICTIONS: No  FALLS:  Has patient fallen in last 6 months? No  LIVING ENVIRONMENT: Lives with: lives with their family and lives with their daughter Has following equipment at home: Single point cane and Environmental Consultant - 2 wheeled  OCCUPATION: not working   LEISURE: nothing   HAND DOMINANCE: right   PRIOR LEVEL OF FUNCTION: Needs assistance with ADLs a nurse comes 3.5 hours per day.    PATIENT GOALS: decrease the breast pain.     OBJECTIVE: Note: Objective measures were completed at Evaluation unless otherwise noted.  COGNITION: Overall cognitive status: Within functional limits for tasks assessed   PALPATION:  +2 TTP and tightness with Lt rhomboids, lats, UT, supraclavicular fossa, inferior and medial breast with fibrosis mostly noted the inferior medial corner of the breast.    OBSERVATIONS / OTHER ASSESSMENTS: enlarged pores left medial inferior breast   SENSATION: reports occasional numbness in the Lt arm   POSTURE: holds Lt arm guarded   UPPER EXTREMITY AROM/PROM:  A/PROM RIGHT   eval   Shoulder extension 60  Shoulder flexion 165  Shoulder abduction 170  Shoulder internal rotation 70  Shoulder external rotation 86    (Blank rows = not tested)  A/PROM LEFT   eval LEFT 08/20/24 09/18/24  Shoulder extension 36 painful 53 pain in breast 65  Shoulder flexion AROM: 100 pain in axilla PROM: 65 pain in breast 152 pain in axilla 162  Shoulder abduction AROM:98 pain in axilla PROM: 70 pain in breast  155 pain in arm & breast 168  Shoulder internal rotation     Shoulder external rotation PROM: 45 pain in breast  91 pn    (Blank rows = not tested)   CERVICAL AROM: All within normal limits: WFL but pain with Lt rotation  UPPER EXTREMITY STRENGTH:  Eval: BREAST COMPLAINTS QUESTIONNAIRE    Pain: 7 Heaviness:  7 Swollen feeling: 7 Tense Skin: 7 Redness: 0 Bra Print: 7 Size of Pores: 7 Hard feeling: 7 Total: 49/80 A Score over 9 indicates lymphedema issues in the breast  BREAST COMPLAINTS QUESTIONNAIRE     08/20/24   09/18/24  Pain:    3   3 Heaviness:   3   0 Swollen feeling:  3   3 Tense Skin:   3   0 Redness:   3   0 Bra Print:   3   3 Size of Pores:   3   3 Hard feeling:   3   1 Total:    24/80   13/80  TREATMENT DATE:  Pt permission and consent throughout each step of examination and treatment with modification and draping if requested when working on sensitive areas.  Pt is aware that a clinic chaperone is not available.   10/16/24: Manual Therapy  Supine: short neck, superficial and deep abdominals, left inguinal nodes, left axillo-inguinal anastomosis, Rt axillary and pectoral nodes, intact anterior thorax sequence, anterior interaxillary anastomosis and then left superior and medial breast redirecting towards anterior anastomosis spending extra time over area of fibrosis  P/ROM to Lt shoulder into flex, abd and D2 with scapular depression throughout by therapist STM to scar tissue in Lt axilla during P/ROM when at end range stretches  10/09/24: Manual Therapy  Supine: short neck, superficial and deep abdominals, left inguinal nodes, left axillo-inguinal anastomosis, Rt axillary and pectoral nodes, intact anterior thorax sequence, anterior interaxillary anastomosis and then left superior and medial breast redirecting towards anterior anastomosis spending extra time over area of fibrosis (medial breat was improved from last session, fibrosis still present but less so) P/ROM to Lt shoulder into flex, abd and D2 with scapular depression throughout by therapist STM to scar tissue in Lt axilla during P/ROM when at end range stretches  09/25/24 Manual Therapy  Supine:  short neck, superficial and deep abdominals, left inguinal nodes, left axillo-inguinal anastomosis, Rt axillary and pectoral nodes, intact anterior thorax sequence, anterior interaxillary anastomosis and then left superior and medial breast redirecting towards anterior anastomosis spending extra time over area of fibrosis (medial breat was improved from last session, fibrosis still present but less so) P/ROM to Lt shoulder into flex, abd and D2 with scapular depression throughout by therapist STM to scar tissue in Lt axilla during P/ROM when at end range stretches     PATIENT EDUCATION:  Education details: per today's note Person educated: Patient and Child(ren) Education method: Programmer, Multimedia, Demonstration, Verbal cues, and Handouts Education comprehension: verbalized understanding and needs further education  HOME EXERCISE PROGRAM: Post-op exercises  ASSESSMENT:  CLINICAL IMPRESSION: Continued with MLD to Lt breast along with STM at tightness at Lt pectoralis tendon. Some mild increase of fibrosis at medial breast but this was less by end of session.   OBJECTIVE IMPAIRMENTS: decreased activity tolerance, decreased knowledge of condition, decreased ROM, decreased strength, increased edema, increased fascial restrictions, and pain.   ACTIVITY LIMITATIONS: carrying, lifting, and sleeping  PARTICIPATION LIMITATIONS: meal prep, cleaning, laundry, driving, shopping, and community activity  PERSONAL FACTORS: Fitness , radiation hx, are also affecting patient's functional outcome.   REHAB POTENTIAL: Excellent  CLINICAL DECISION MAKING: Stable/uncomplicated  EVALUATION COMPLEXITY: Low  GOALS: Goals reviewed with patient? Yes  SHORT TERM GOALS: Target date: 07/09/24  Pt will be educated on breast lymphedema and lymphatic physiology  Baseline: Goal status: MET  2.  Pt will be educated on initial HEP  Baseline:  Goal status: MET   LONG TERM GOALS: Target date: 11/01/24 Pt will  improve Le/ft shoulder AROM to at least 160deg of flexion and abduction to /improve mobility  Baseline:  Goal status: MET 09/18/24  2.  Pt will be ind with self MLD of the breast Baseline:  Goal status: MET  3.  Pt will decrease breast edema questionnaire to 9/80 or less  Baseline:  Goal status: NEW  4.  Pt will have pain free Lt shoulder AROM Baseline:  Goal status: ALMOST MET  5.  Pt will obtain a swell spot to decrease breast fibrosis  Baseline:  Goal status: Ongoing   PLAN:  PT FREQUENCY: 1x/week  PT DURATION: 5 weeks   PLANNED INTERVENTIONS: 97164- PT Re-evaluation, 97110-Therapeutic exercises, 97530- Therapeutic activity, 97112- Neuromuscular re-education, 97535- Self Care, [id number]- Manual therapy, Patient/Family education, Balance training, Joint mobilization, Therapeutic exercises, Therapeutic activity, Neuromuscular re-education, Gait training, and Self Care, dry needling   PLAN FOR NEXT SESSION: continue MLD Lt breast & medial upper arm PRN, STM for pain relief, PROM/AAROM   Berwyn Knights, PTA 10/16/24 12:31 PM      "

## 2024-10-22 ENCOUNTER — Other Ambulatory Visit: Payer: Self-pay | Admitting: Family Medicine

## 2024-10-23 ENCOUNTER — Ambulatory Visit: Payer: MEDICAID | Attending: Adult Health | Admitting: Rehabilitation

## 2024-10-23 ENCOUNTER — Encounter: Payer: Self-pay | Admitting: Rehabilitation

## 2024-10-23 DIAGNOSIS — D0512 Intraductal carcinoma in situ of left breast: Secondary | ICD-10-CM

## 2024-10-23 DIAGNOSIS — I890 Lymphedema, not elsewhere classified: Secondary | ICD-10-CM

## 2024-10-23 DIAGNOSIS — N644 Mastodynia: Secondary | ICD-10-CM

## 2024-10-23 DIAGNOSIS — M25612 Stiffness of left shoulder, not elsewhere classified: Secondary | ICD-10-CM

## 2024-10-23 NOTE — Therapy (Signed)
 " OUTPATIENT PHYSICAL THERAPY  UPPER EXTREMITY ONCOLOGY TREATMENT Patient Name: [name] MRN: [id number] DOB:[birth year], 54 y.o., female Today's Date: 10/23/2024  END OF SESSION:  PT End of Session - 10/23/24 1054     Visit Number 13    Number of Visits 14    Date for Recertification  10/30/24    Authorization Type 7 visits 07/09/24-08/21/24; 4 more visits 08/21/24-09/25/24; [employer] Approved (4 VISITS), Auth#: [id number]; Dates: 09/26/24-10/30/24    Authorization - Visit Number 3    Authorization - Number of Visits 4    PT Start Time 1001    PT Stop Time 1050    PT Time Calculation (min) 49 min    Activity Tolerance Patient tolerated treatment well    Behavior During Therapy WFL for tasks assessed/performed                Past Medical History:  Diagnosis Date   Allergy    pollen, pcn   Anxiety    Arthritis    Asthma    Bipolar disorder (HCC)    Breast cancer (HCC)    Cataract    Depression    Fibroids    Glaucoma    History of ovarian cyst    Hypertension    Menometrorrhagia    Ovarian cancer (HCC)    Personal history of radiation therapy    Sickle cell trait    Past Surgical History:  Procedure Laterality Date   BREAST BIOPSY Left 12/05/2023   MM LT BREAST BX W LOC DEV 1ST LESION IMAGE BX SPEC STEREO GUIDE 12/05/2023 [facility]   BREAST BIOPSY  01/08/2024   MM LT RADIOACTIVE SEED LOC MAMMO GUIDE 01/08/2024 [facility]   BREAST LUMPECTOMY Left 01/09/2024   BREAST LUMPECTOMY WITH RADIOACTIVE SEED LOCALIZATION Left 01/09/2024   Procedure: BREAST LUMPECTOMY WITH RADIOACTIVE SEED LOCALIZATION;  Surgeon: Aron Shoulders, MD;  Location: [facility];  Service: General;  Laterality: Left;  LEFT BREAST SEED LOCALIZED LUMPECTOMY   CATARACT EXTRACTION, BILATERAL     CESAREAN SECTION     all three pregnancies   COLONOSCOPY  07/04/2021   ROBOTIC ASSISTED BILATERAL SALPINGO OOPHERECTOMY Bilateral 02/04/2024   Procedure: ROBOTIC ASSISTED BILATERAL  SALPINGO-OOPHORECTOMY, MINI-LAPAROTOMY FOR SPECIMEN RETRIEVAL;  Surgeon: Eldonna Mays, MD;  Location: [facility];  Service: Gynecology;  Laterality: Bilateral;  POSSBILE STAGING; POSSIBLE LAPAROTOMY   Patient Active Problem List   Diagnosis Date Noted   Genetic testing 01/27/2024   Adnexal mass 01/07/2024   Ductal carcinoma in situ (DCIS) of left breast 12/24/2023   Tobacco abuse 09/15/2018   Visual disturbance 04/24/2018   Obesity 12/12/2017   Insomnia 03/13/2017   Anxiety associated with depression 01/09/2017   Tooth ache 05/24/2016   Spinal stenosis, lumbar region, with neurogenic claudication 05/15/2016   Cervical disc disorder with radiculopathy of cervical region 05/15/2016   Vaginal atrophy 04/30/2016   Asthma 02/20/2016   Depression 02/20/2016   Myopathy 02/20/2016   Back pain 02/20/2016   Neuropathy 02/20/2016   Fibroids    History of ovarian cyst    Menometrorrhagia 04/09/2011    PCP: Corrina Sabin, MD  REFERRING PROVIDER: Morna Kendall, NP  REFERRING DIAG:  Diagnosis  N63.0 (ICD-10-CM) - Breast swelling   THERAPY DIAG:  Ductal carcinoma in situ (DCIS) of left breast  Breast pain  Stiffness of left shoulder, not elsewhere classified  Lymphedema, not elsewhere classified  ONSET DATE: 11/08/23  Rationale for Evaluation and Treatment: Rehabilitation  SUBJECTIVE:  SUBJECTIVE STATEMENT:  It is much better than when we started.   PERTINENT HISTORY: DCIS of left breast, completed radiation 03/18/24, other hx includes scoliosis which includes pain.    PAIN:  Are you having pain? Yes NPRS scale: 5/10 Pain location: left breast Pain orientation: Left  PAIN TYPE: lateral breast Pain description: constant  Aggravating factors: increased breast lymphedema by bumping it on a  table Relieving factors: MLD helps   PRECAUTIONS: None  RED FLAGS: None   WEIGHT BEARING RESTRICTIONS: No  FALLS:  Has patient fallen in last 6 months? No  LIVING ENVIRONMENT: Lives with: lives with their family and lives with their daughter Has following equipment at home: Single point cane and Environmental Consultant - 2 wheeled  OCCUPATION: not working   LEISURE: nothing   HAND DOMINANCE: right   PRIOR LEVEL OF FUNCTION: Needs assistance with ADLs a nurse comes 3.5 hours per day.    PATIENT GOALS: decrease the breast pain.     OBJECTIVE: Note: Objective measures were completed at Evaluation unless otherwise noted.  COGNITION: Overall cognitive status: Within functional limits for tasks assessed   PALPATION:  +2 TTP and tightness with Lt rhomboids, lats, UT, supraclavicular fossa, inferior and medial breast with fibrosis mostly noted the inferior medial corner of the breast.    OBSERVATIONS / OTHER ASSESSMENTS: enlarged pores left medial inferior breast   SENSATION: reports occasional numbness in the Lt arm   POSTURE: holds Lt arm guarded   UPPER EXTREMITY AROM/PROM:  A/PROM RIGHT   eval   Shoulder extension 60  Shoulder flexion 165  Shoulder abduction 170  Shoulder internal rotation 70  Shoulder external rotation 86    (Blank rows = not tested)  A/PROM LEFT   eval LEFT 08/20/24 09/18/24  Shoulder extension 36 painful 53 pain in breast 65  Shoulder flexion AROM: 100 pain in axilla PROM: 65 pain in breast 152 pain in axilla 162  Shoulder abduction AROM:98 pain in axilla PROM: 70 pain in breast  155 pain in arm & breast 168  Shoulder internal rotation     Shoulder external rotation PROM: 45 pain in breast  91 pn    (Blank rows = not tested)   CERVICAL AROM: All within normal limits: WFL but pain with Lt rotation  UPPER EXTREMITY STRENGTH:  Eval: BREAST COMPLAINTS QUESTIONNAIRE    Pain: 7 Heaviness: 7 Swollen feeling: 7 Tense Skin: 7 Redness: 0 Bra Print:  7 Size of Pores: 7 Hard feeling: 7 Total: 49/80 A Score over 9 indicates lymphedema issues in the breast  BREAST COMPLAINTS QUESTIONNAIRE     08/20/24   09/18/24  Pain:    3   3 Heaviness:   3   0 Swollen feeling:  3   3 Tense Skin:   3   0 Redness:   3   0 Bra Print:   3   3 Size of Pores:   3   3 Hard feeling:   3   1 Total:    24/80   13/80  TREATMENT DATE:  Pt permission and consent throughout each step of examination and treatment with modification and draping if requested when working on sensitive areas.  Pt is aware that a clinic chaperone is not available.   10/23/24: Manual Therapy  Supine: short neck, superficial and deep abdominals, left inguinal nodes, left axillo-inguinal anastomosis, Rt axillary and pectoral nodes, intact anterior thorax sequence, anterior interaxillary anastomosis and then left superior and medial breast redirecting towards anterior anastomosis spending extra time over area of fibrosis  P/ROM to Lt shoulder into flex, abd and D2 with scapular depression throughout by therapist STM to scar tissue in Lt axilla during P/ROM when at end range stretches Performed single arm wall stretch, single arm doorway, and L stretch at counter without sig stretch felt for all.  Discussed what sounds like trigger finger on the Lt hand x 2 weeks.  Discussed it could be related to all the crocheting she does, and how to keep it mobilized and that antiinflammatories can be helpful if she is allowed to take any of these and how cortisone injections can be used if needed.   10/16/24: Manual Therapy  Supine: short neck, superficial and deep abdominals, left inguinal nodes, left axillo-inguinal anastomosis, Rt axillary and pectoral nodes, intact anterior thorax sequence, anterior interaxillary anastomosis and then left superior and medial breast redirecting towards  anterior anastomosis spending extra time over area of fibrosis  P/ROM to Lt shoulder into flex, abd and D2 with scapular depression throughout by therapist STM to scar tissue in Lt axilla during P/ROM when at end range stretches  10/09/24: Manual Therapy  Supine: short neck, superficial and deep abdominals, left inguinal nodes, left axillo-inguinal anastomosis, Rt axillary and pectoral nodes, intact anterior thorax sequence, anterior interaxillary anastomosis and then left superior and medial breast redirecting towards anterior anastomosis spending extra time over area of fibrosis (medial breat was improved from last session, fibrosis still present but less so) P/ROM to Lt shoulder into flex, abd and D2 with scapular depression throughout by therapist STM to scar tissue in Lt axilla during P/ROM when at end range stretches  09/25/24 Manual Therapy  Supine: short neck, superficial and deep abdominals, left inguinal nodes, left axillo-inguinal anastomosis, Rt axillary and pectoral nodes, intact anterior thorax sequence, anterior interaxillary anastomosis and then left superior and medial breast redirecting towards anterior anastomosis spending extra time over area of fibrosis (medial breat was improved from last session, fibrosis still present but less so) P/ROM to Lt shoulder into flex, abd and D2 with scapular depression throughout by therapist STM to scar tissue in Lt axilla during P/ROM when at end range stretches     PATIENT EDUCATION:  Education details: per today's note Person educated: Patient and Child(ren) Education method: Programmer, Multimedia, Demonstration, Verbal cues, and Handouts Education comprehension: verbalized understanding and needs further education  HOME EXERCISE PROGRAM: Post-op exercises  ASSESSMENT:  CLINICAL IMPRESSION: Continued with MLD to Lt breast along with STM at tightness at Lt pectoralis tendon. Learned from [facility] that a swell spot is not covered by her  new insurance either, so pt will have to do self pay is she wants to get one.  Anticipate 1 more visit as approved.    OBJECTIVE IMPAIRMENTS: decreased activity tolerance, decreased knowledge of condition, decreased ROM, decreased strength, increased edema, increased fascial restrictions, and pain.   ACTIVITY LIMITATIONS: carrying, lifting, and sleeping  PARTICIPATION LIMITATIONS: meal prep, cleaning, laundry, driving, shopping, and community activity  PERSONAL FACTORS: Fitness , radiation hx, are also affecting  patient's functional outcome.   REHAB POTENTIAL: Excellent  CLINICAL DECISION MAKING: Stable/uncomplicated  EVALUATION COMPLEXITY: Low  GOALS: Goals reviewed with patient? Yes  SHORT TERM GOALS: Target date: 07/09/24  Pt will be educated on breast lymphedema and lymphatic physiology  Baseline: Goal status: MET  2.  Pt will be educated on initial HEP  Baseline:  Goal status: MET   LONG TERM GOALS: Target date: 11/01/24 Pt will improve Le/ft shoulder AROM to at least 160deg of flexion and abduction to /improve mobility  Baseline:  Goal status: MET 09/18/24  2.  Pt will be ind with self MLD of the breast Baseline:  Goal status: MET  3.  Pt will decrease breast edema questionnaire to 9/80 or less  Baseline:  Goal status: NEW  4.  Pt will have pain free Lt shoulder AROM Baseline:  Goal status: ALMOST MET  5.  Pt will obtain a swell spot to decrease breast fibrosis  Baseline:  Goal status: Ongoing   PLAN:  PT FREQUENCY: 1x/week    PT DURATION: 5 weeks   PLANNED INTERVENTIONS: [id number]- PT Re-evaluation, 97110-Therapeutic exercises, 97530- Therapeutic activity, 97112- Neuromuscular re-education, 97535- Self Care, [id number]- Manual therapy, Patient/Family education, Balance training, Joint mobilization, Therapeutic exercises, Therapeutic activity, Neuromuscular re-education, Gait training, and Self Care, dry needling   PLAN FOR NEXT SESSION: continue MLD Lt breast &  medial upper arm PRN, STM for pain relief, PROM/AAROM   Saddie Raw, PT 10/23/24, 10:59 AM   10/23/24 10:55 AM      "

## 2024-10-30 ENCOUNTER — Ambulatory Visit: Payer: MEDICAID

## 2024-11-19 ENCOUNTER — Other Ambulatory Visit (HOSPITAL_BASED_OUTPATIENT_CLINIC_OR_DEPARTMENT_OTHER): Payer: MEDICAID

## 2025-01-25 ENCOUNTER — Ambulatory Visit: Payer: MEDICAID | Admitting: Family Medicine
